# Patient Record
Sex: Female | Born: 1970
Health system: Southern US, Community
[De-identification: ages and names within clinical notes are randomized; demographics above are authoritative.]

## PROBLEM LIST (undated history)

## (undated) DIAGNOSIS — M1990 Unspecified osteoarthritis, unspecified site: Secondary | ICD-10-CM

## (undated) DIAGNOSIS — F419 Anxiety disorder, unspecified: Secondary | ICD-10-CM

## (undated) DIAGNOSIS — E669 Obesity, unspecified: Secondary | ICD-10-CM

## (undated) DIAGNOSIS — K219 Gastro-esophageal reflux disease without esophagitis: Secondary | ICD-10-CM

## (undated) DIAGNOSIS — T4145XA Adverse effect of unspecified anesthetic, initial encounter: Secondary | ICD-10-CM

## (undated) DIAGNOSIS — Z9889 Other specified postprocedural states: Secondary | ICD-10-CM

## (undated) DIAGNOSIS — B019 Varicella without complication: Secondary | ICD-10-CM

## (undated) DIAGNOSIS — G43909 Migraine, unspecified, not intractable, without status migrainosus: Secondary | ICD-10-CM

## (undated) DIAGNOSIS — Z8719 Personal history of other diseases of the digestive system: Secondary | ICD-10-CM

## (undated) DIAGNOSIS — R112 Nausea with vomiting, unspecified: Secondary | ICD-10-CM

## (undated) DIAGNOSIS — F32A Depression, unspecified: Secondary | ICD-10-CM

## (undated) DIAGNOSIS — T8859XA Other complications of anesthesia, initial encounter: Secondary | ICD-10-CM

## (undated) DIAGNOSIS — T7840XA Allergy, unspecified, initial encounter: Secondary | ICD-10-CM

## (undated) DIAGNOSIS — J45909 Unspecified asthma, uncomplicated: Secondary | ICD-10-CM

## (undated) DIAGNOSIS — Z862 Personal history of diseases of the blood and blood-forming organs and certain disorders involving the immune mechanism: Secondary | ICD-10-CM

## (undated) DIAGNOSIS — I8390 Asymptomatic varicose veins of unspecified lower extremity: Secondary | ICD-10-CM

## (undated) DIAGNOSIS — A630 Anogenital (venereal) warts: Secondary | ICD-10-CM

## (undated) DIAGNOSIS — J189 Pneumonia, unspecified organism: Secondary | ICD-10-CM

## (undated) DIAGNOSIS — B009 Herpesviral infection, unspecified: Secondary | ICD-10-CM

## (undated) HISTORY — DX: Varicella without complication: B01.9

## (undated) HISTORY — PX: UPPER GASTROINTESTINAL ENDOSCOPY: SHX188

## (undated) HISTORY — DX: Gastro-esophageal reflux disease without esophagitis: K21.9

## (undated) HISTORY — DX: Personal history of other diseases of the digestive system: Z87.19

## (undated) HISTORY — PX: BREAST SURGERY: SHX581

## (undated) HISTORY — PX: LASER ABLATION: SHX1947

## (undated) HISTORY — DX: Unspecified osteoarthritis, unspecified site: M19.90

## (undated) HISTORY — DX: Migraine, unspecified, not intractable, without status migrainosus: G43.909

## (undated) HISTORY — DX: Depression, unspecified: F32.A

## (undated) HISTORY — PX: UPPER GI ENDOSCOPY: SHX6162

## (undated) HISTORY — DX: Unspecified asthma, uncomplicated: J45.909

## (undated) HISTORY — DX: Allergy, unspecified, initial encounter: T78.40XA

---
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
## 1999-12-09 HISTORY — PX: TONSILLECTOMY AND ADENOIDECTOMY: SHX28

## 1999-12-19 HISTORY — PX: TUBAL LIGATION: SHX77

## 2009-03-08 HISTORY — PX: ANKLE FRACTURE SURGERY: SHX122

## 2014-12-08 DIAGNOSIS — Z9884 Bariatric surgery status: Secondary | ICD-10-CM

## 2014-12-08 HISTORY — DX: Bariatric surgery status: Z98.84

## 2015-06-18 HISTORY — PX: GASTRIC BYPASS: SHX52

## 2018-12-08 HISTORY — PX: OTHER SURGICAL HISTORY: SHX169

## 2019-02-10 ENCOUNTER — Ambulatory Visit (INDEPENDENT_AMBULATORY_CARE_PROVIDER_SITE_OTHER): Payer: 59 | Admitting: Allergy & Immunology

## 2019-02-10 ENCOUNTER — Encounter: Payer: Self-pay | Admitting: Allergy & Immunology

## 2019-02-10 VITALS — BP 104/70 | HR 76 | Temp 98.1°F | Resp 16 | Ht 67.0 in

## 2019-02-10 DIAGNOSIS — J4540 Moderate persistent asthma, uncomplicated: Secondary | ICD-10-CM | POA: Diagnosis not present

## 2019-02-10 DIAGNOSIS — T7800XA Anaphylactic reaction due to unspecified food, initial encounter: Secondary | ICD-10-CM | POA: Insufficient documentation

## 2019-02-10 DIAGNOSIS — L2089 Other atopic dermatitis: Secondary | ICD-10-CM | POA: Insufficient documentation

## 2019-02-10 DIAGNOSIS — T7800XD Anaphylactic reaction due to unspecified food, subsequent encounter: Secondary | ICD-10-CM | POA: Diagnosis not present

## 2019-02-10 DIAGNOSIS — J302 Other seasonal allergic rhinitis: Secondary | ICD-10-CM | POA: Insufficient documentation

## 2019-02-10 DIAGNOSIS — J3089 Other allergic rhinitis: Secondary | ICD-10-CM | POA: Diagnosis not present

## 2019-02-10 MED ORDER — SINUS RINSE REFILL NA PACK: PACK | NASAL | 5 refills | Status: AC

## 2019-02-10 MED ORDER — OMALIZUMAB 150 MG ~~LOC~~ SOLR
300.0000 mg | SUBCUTANEOUS | Status: DC
  Administered 2019-02-10 – 2021-01-03 (×49): 300 mg via SUBCUTANEOUS

## 2019-02-10 NOTE — Patient Instructions (Addendum)
1. Moderate persistent asthma, uncomplicated - Lung testing looked excellent (GREAT WORK!). - Daily controller medication(s): Xolair 300mg  every 14 days, Arnuity 217mcg one puff once daily and Advair 230/75mcg two puffs twice daily with spacer - Prior to physical activity: albuterol 2 puffs 10-15 minutes before physical activity. - Rescue medications: albuterol 4 puffs every 4-6 hours as needed - Changes during respiratory infections or worsening symptoms: Increase Arnuity 222mcg to 1 puff twice daily for OEN TO TWO WEEKS. - Asthma control goals:  * Full participation in all desired activities (may need albuterol before activity) * Albuterol use two time or less a week on average (not counting use with activity) * Cough interfering with sleep two time or less a month * Oral steroids no more than once a year * No hospitalizations  2. Seasonal and perennial allergic rhinitis - Continue with cetirizine 20mg  twice daily. - Continue with Dymista two sprays per nostril up to twice daily. - Continue with nasal saline rinses.   3. Anaphylactic shock due to food - Continue to avoid shrimp.  - [name] is up to date.  4. Return in about 6 months (around 08/13/2019).   Please inform us of any Emergency Department visits, hospitalizations, or changes in symptoms. Call us before going to the ED for breathing or allergy symptoms since we might be able to fit you in for a sick visit. Feel free to contact us anytime with any questions, problems, or concerns.  It was a pleasure to see you again today!  Websites that have reliable patient information: 1. American Academy of Asthma, Allergy, and Immunology: www.aaaai.org 2. Food Allergy Research and Education (FARE): foodallergy.org 3. Mothers of Asthmatics: http://www.asthmacommunitynetwork.org 4. American College of Allergy, Asthma, and Immunology: MonthlyElectricBill.co.uk   Make sure you are registered to vote! If you have moved or changed any of your contact  information, you will need to get this updated before voting!    Voter ID laws are NOT going into effect for the General Election in November 2020! DO NOT let this stop you from exercising your right to vote!

## 2019-02-10 NOTE — Progress Notes (Signed)
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 Wemhoff presents to establish care.  She has remained very stable on her current regimen which has a rather high ICS load in combination with Xolair.  However, since she has been so stable on this regimen, I do not think we need to make any changes at this point.  Depending on what her new insurance covers, we might have to make a few changes here and there.  Symptoms are consider in the future as a change to Dupixent.  This might address her baseline itching as well, which unfortunately is controlled only with up to 40 mg of cetirizine daily.  We did get her started today on her Xolair since she is almost a month behind.  She received a sample of 30 mg.  Plan/Recommendations:   1. Moderate persistent asthma, uncomplicated - Lung testing looked excellent. - Daily controller medication(s): Xolair 300mg  every 14 days, Arnuity 253mcg one puff once daily and Advair 230/69mcg two puffs twice daily with spacer - Prior to physical activity: albuterol 2 puffs 10-15 minutes before physical activity. - Rescue medications: albuterol 4 puffs every 4-6 hours as needed - Changes during respiratory infections or worsening symptoms: Increase Arnuity 264mcg to 1 puff twice daily for ONE TO TWO WEEKS. - Asthma control goals:  * Full participation in all desired activities (may need albuterol before activity) * Albuterol use two time or less a week on average (not counting use with activity) * Cough interfering with sleep two time or less a  month * Oral steroids no more than once a year * No hospitalizations  2. Seasonal and perennial allergic rhinitis - Continue with cetirizine 20mg  twice daily. - Continue with Dymista two sprays per nostril up to twice daily. - Continue with nasal saline rinses.   3. Anaphylactic shock due to food - Continue to avoid shrimp.  - [name] is up to date.  4. Return in about 6 months (around 08/13/2019).   Subjective:   Merrin Mcvicker is a 48 y.o. female presenting today for evaluation of  Chief Complaint  Patient presents with  . Asthma  . Allergic Rhinitis     Taneesha Edgin has a history of the following: There are no active problems to display for this patient.   History obtained from: chart review and patient.  Clatie Kessen was referred by Patient, No Pcp Per.     Kaytlynn is a 48 y.o. female presenting for establishment of care.   Asthma/Respiratory Symptom History: She has a longstanding history of asthma.  She was diagnosed when she a child in [place]. She was an infant when she had her first episode. She was in the hospital a lot when she was younger in [place]. She was never intubated at all. She was previously followed by Dr. Hortense Ramal at the [facility] in [place].  Currently, she is on Advair 230/21 2 puffs twice daily in combination with Arnuity 1 puff once daily.  She is also on Xolair 300 mg every 2 weeks. She was started on Xolair started on that 6-7 years ago.  She estimates that she rarely gets prednisone at this point. Her last visit was in December 2019.  At that time, her spirometry was near normal, which was new for her.  At the time, she was coughing throughout the night and using her albuterol 3-5 times per day. She had prednisone in January 2020 prior to moving to [place]. She is unsure when she last needed prednisone before that. She also had amoxicillin at that time as well. Her IgE level was 148 in April 2012.   Allergic Rhinitis Symptom  History: She has a history of allergic rhinitis.  Her last testing was in April 2012 and was positive to cockroach, cat, dust mite, molds, trees, and grass. She completed five years of allergen immunotherapy in December 2019.  She did get a CT of her sinuses in December 2019 that were completely clear. She is currently on cetirizine 20mg  BID. She is also on Dymista and she sinus rinses daily. She has never tried any other antihistamines. She will occasionally   Food Allergy Symptom History: She has a history of anaphylaxis to shrimp, but interestingly she tolerates crab and lobster and other shellfish without a problem. Her last IgE to shrimp was 18.4 in April 2012. She has an [place] that is up to date.  Eczema Symptom Symptom History: Currently hse is using hydrocortisone 2.5%, triamcinolone for hte worst areas, and a hydrocortisone Eucerin mix. She never needs antibiotics for Staphylococcal skin infections. She does get a rash around her neck with hard inflammation.   She has a history of gastric bypass. She does not have a local bariatric doctor doctor here and she does not have a PCP either. She prefers a female PCP.   Otherwise, there is no history of other atopic diseases, including drug allergies, stinging insect allergies, urticaria or contact dermatitis. There is no significant infectious history. Vaccinations are up to date.    Past Medical History: There are no active problems to display for this patient.   Medication List:  Allergies as of 02/10/2019      Reactions   Cyclobenzaprine Other (See Comments), Shortness Of Breath   Relaxed tounge Relaxed tounge Relaxed tounge Relaxed tounge   Shellfish Allergy Shortness Of Breath   Shortness of breath   Sulfa Antibiotics Anaphylaxis, Hives, Swelling   Carisoprodol Hives   Morphine Other (See Comments)   Feels like her head is going to "blow up" Feels like her head is going to "blow up"   Adhesive  [tape] Rash   Other reaction(s):  SKIN - rash Other reaction(s): SKIN - rash      Medication List       Accurate as of February 10, 2019  8:10 PM. Always use your most recent med list.        acetaminophen 500 MG tablet Commonly known as:  TYLENOL Take by mouth.   Azelastine-Fluticasone 137-50 MCG/ACT Susp Place into the nose.   CALCIUM CITRATE PO Take 2 tablets by mouth daily.   cetirizine 10 MG tablet Commonly known as:  ZYRTEC Take by mouth.   Cyanocobalamin 500 MCG/0.1ML Soln Place into the nose.   diazepam 10 MG tablet Commonly known as:  VALIUM Take by mouth.   EpiPen 2-Pak 0.3 mg/0.3 mL Soaj injection Generic drug:  EPINEPHrine Inject into the muscle.   ferrous fumarate-iron polysaccharide complex 162-115.2 MG Caps capsule Commonly known as:  TANDEM Take by mouth.   Fluticasone Furoate 200 MCG/ACT Aepb Inhale into the lungs.   fluticasone-salmeterol 230-21 MCG/ACT  inhaler Commonly known as:  ADVAIR HFA Inhale into the lungs.   hydrocortisone 1 % ointment   hydrocortisone 2.5 % ointment   ibuprofen 800 MG tablet Commonly known as:  ADVIL,MOTRIN Take by mouth.   ipratropium 0.03 % nasal spray Commonly known as:  ATROVENT Place into the nose.   lidocaine 5 % ointment Commonly known as:  XYLOCAINE Apply topically.   meclizine 25 MG tablet Commonly known as:  ANTIVERT   Mucus Relief ER 600 MG 12 hr tablet Generic drug:  guaiFENesin TK 2 TS PO BID   MULTI-VITAMIN DAILY PO Take by mouth.   Olopatadine HCl 0.2 % Soln Apply to eye.   omalizumab 150 MG injection Commonly known as:  XOLAIR Inject into the skin.   ondansetron 4 MG disintegrating tablet Commonly known as:  ZOFRAN-ODT Place under the tongue.   pantoprazole 40 MG tablet Commonly known as:  PROTONIX   PEG 3350 Powd Take by mouth.   Sinus Rinse Refill Pack Use one packet dissolved in water as needed.   SUMAtriptan 100 MG tablet Commonly known as:  IMITREX   triamcinolone ointment 0.1 % Commonly known  as:  KENALOG Apply topically.   UNABLE TO FIND Massage as directed.   Neck, back, shoulder pain.   Flex-spending account   UNABLE TO FIND Below the knee compression stockings, sig: use daily as needed for lower extremity edema   valACYclovir 500 MG tablet Commonly known as:  VALTREX   Ventolin HFA 108 (90 Base) MCG/ACT inhaler Generic drug:  albuterol Inhale into the lungs.   Virtussin A/C 100-10 MG/5ML syrup Generic drug:  guaiFENesin-codeine TAKE 5 ML BY MOUTH THREE TIMES DAILY AS NEEDED FOR COUGH   Vitamin D (Ergocalciferol) 1.25 MG (50000 UT) Caps capsule Commonly known as:  DRISDOL Take by mouth.   VITAMIN D3 PO Take by mouth.       Birth History: non-contributory  Developmental History: non-contributory  Past Surgical History: Past Surgical History:  Procedure Laterality Date  . ANKLE FRACTURE SURGERY  03/08/2009  . BREAST SURGERY Bilateral    Cyst Removal  . GASTRIC BYPASS  06/18/2015  . TUBAL LIGATION  12/19/1999  . Uterine ablation  12/2018     Family History: Family History  Problem Relation Age of Onset  . COPD Mother   . Heart disease Mother   . COPD Father   . Aneurysm Father   . Heart disease Father   . Early death Father   . Asthma Neg Hx   . Allergic rhinitis Neg Hx      Social History: Caris lives at home by herself.  She lives in a house of unknown age, currently renting. She moved here very recently from [place]. She is going through a divorce right now and wanted a new start.  There is wood in the main living areas and carpeting and wood in the bedrooms.  They have electric heating and central cooling.  There are no animals outside or inside of the home.  She does have dust mite covers on her bedding.  There is no tobacco exposure.  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   Review of Systems  Constitutional: Negative.  Negative for fever, malaise/fatigue and weight  loss.  HENT: Negative.  Negative for congestion, ear discharge and ear pain.   Eyes: Negative for pain, discharge and redness.  Respiratory: Negative for cough, sputum production, shortness of breath and  wheezing.   Cardiovascular: Negative.  Negative for chest pain and palpitations.  Gastrointestinal: Negative for abdominal pain and heartburn.  Skin: Positive for itching. Negative for rash.  Neurological: Negative for dizziness and headaches.  Endo/Heme/Allergies: Negative for environmental allergies. Does not bruise/bleed easily.       Objective:   Blood pressure 104/70, pulse 76, temperature 98.1 F (36.7 C), temperature source Oral, resp. rate 16, height 5\' 7"  (1.702 m), SpO2 98 %. There is no height or weight on file to calculate BMI.   Physical Exam:   Physical Exam  Constitutional: Vital signs are normal. She appears well-nourished.  Very pleasant and smiling female.  HENT:  Head: Normocephalic and atraumatic.  Right Ear: Tympanic membrane, external ear and ear canal normal. No drainage, swelling or tenderness. Tympanic membrane is not injected, not scarred, not erythematous, not retracted and not bulging.  Left Ear: Tympanic membrane, external ear and ear canal normal. No drainage, swelling or tenderness. Tympanic membrane is not injected, not scarred, not erythematous, not retracted and not bulging.  Nose: Rhinorrhea present. No mucosal edema, nasal deformity or septal deviation. No epistaxis. Right sinus exhibits no maxillary sinus tenderness and no frontal sinus tenderness. Left sinus exhibits no maxillary sinus tenderness and no frontal sinus tenderness.  Mouth/Throat: Uvula is midline and oropharynx is clear and moist. Mucous membranes are not pale and not dry.  No polyps appreciated. Tonsils surgically removed.  Eyes: Pupils are equal, round, and reactive to light. Conjunctivae and EOM are normal. Right eye exhibits no chemosis and no discharge. Left eye exhibits no  chemosis and no discharge. Right conjunctiva is not injected. Left conjunctiva is not injected.  Cardiovascular: Normal rate, regular rhythm, S1 normal, S2 normal and normal heart sounds.  Respiratory: Effort normal and breath sounds normal. No accessory muscle usage. No tachypnea. No respiratory distress. She has no wheezes. She has no rhonchi. She has no rales. She exhibits no tenderness.  Moving air well in all lung fields.  GI: There is no abdominal tenderness. There is no rebound and no guarding.  Lymphadenopathy:       Head (right side): No submandibular, no tonsillar and no occipital adenopathy present.       Head (left side): No submandibular, no tonsillar and no occipital adenopathy present.    She has no cervical adenopathy.  Neurological: She is alert.  Skin: No abrasion, no petechiae and no rash noted. Rash is not papular, not vesicular and not urticarial. No erythema. No pallor.  No eczematous lesions noted.   Psychiatric: She has a normal mood and affect.     Diagnostic studies:    Spirometry: results normal (FEV1: 2.28/85%, FVC: 2.58/78%, FEV1/FVC: 88%).    Spirometry consistent with normal pattern.   Allergy Studies: none      Salvatore Marvel, MD Allergy and [place] [facility] [place]

## 2019-02-21 ENCOUNTER — Ambulatory Visit: Payer: Self-pay | Admitting: Allergy & Immunology

## 2019-02-25 ENCOUNTER — Telehealth: Payer: Self-pay | Admitting: Pharmacist

## 2019-02-25 ENCOUNTER — Other Ambulatory Visit: Payer: Self-pay | Admitting: *Deleted

## 2019-02-25 MED ORDER — OMALIZUMAB 150 MG ~~LOC~~ SOLR: 300.0000 mg | SUBCUTANEOUS | 11 refills | Status: DC

## 2019-02-25 NOTE — Telephone Encounter (Signed)
Advised Beth in [place] to advise patient that Xolair approved and sending Rx to [facility] today. She will need to supply copay card info she already has.

## 2019-02-25 NOTE — Telephone Encounter (Signed)
Called patient to schedule an appointment for the [facility]. I was unable to reach the patient so I left a HIPAA-compliant message requesting that the patient return my call.   

## 2019-02-28 ENCOUNTER — Other Ambulatory Visit: Payer: Self-pay

## 2019-02-28 ENCOUNTER — Encounter: Payer: Self-pay | Admitting: Pharmacist

## 2019-02-28 ENCOUNTER — Ambulatory Visit: Payer: Self-pay | Admitting: Pharmacist

## 2019-02-28 ENCOUNTER — Other Ambulatory Visit: Payer: Self-pay | Admitting: Pharmacist

## 2019-02-28 MED ORDER — OMALIZUMAB 150 MG ~~LOC~~ SOLR: 300.0000 mg | SUBCUTANEOUS | 11 refills | Status: DC

## 2019-02-28 NOTE — Progress Notes (Signed)
   S: Patient presents to [facility] for review of their specialty medication therapy.  Patient is currently taking Xolair for asthma. Patient is managed by Dr. Ernst Bowler for this.   Adherence: denies any missed doses  Efficacy: works very well for her, she has been on it for years. She also works in [employer] and is well aware of Xolair drug information.  Dosing: Give subcutaneously.  Can be dosed every 2 or 4 weeks based on baseline serum IgE levels and body weight.  Asthma: SubQ: Dose and frequency based on body weight and pretreatment total IgE serum levels. Dosing should be adjusted during therapy for significant changes in body weight. Dosing should not be adjusted based on total IgE levels taken during treatment or <1 year following interruption of therapy. If therapy has been interrupted for ?1 year, total IgE levels may be re-evaluated for dosage determination.   Dose adjustments: Renal: no dose adjustments  Hepatic: no dose adjustments  Toxicity: Severe hypersensitivity reaction or anaphylaxis: Discontinue treatment. Fever, arthralgia, and rash: Discontinue treatment if this constellation of symptoms occurs.   Monitoring: CV effects: denies Eosinophilia and vasculitis: denies Fever/arthralgia/rash: denies Hypersensitivity/Anaphylaxis: denies, patient has EpiPen Malignant neoplasms: denies Pulmonary function tests: see CareEverywhere- last down 11/23/18   O:     No results found for: WBC, HGB, HCT, MCV, PLT    Chemistry   No results found for: NA, K, CL, CO2, BUN, CREATININE, GLU No results found for: CALCIUM, ALKPHOS, AST, ALT, BILITOT     A/P: 1. Medication review: patient currently on Xolair for asthma. Reviewed the medication with the patient, including the following: Xolair, omalizumab, is a novel IgE blocker.  It appears to reduce rates of hospitalizations, ER visits and unscheduled physician visits due asthma exacerbations when added to standard  therapy.  Studies also show a reduction in steroid requirements and improvement in quality of life.  Patient educated on purpose, proper use and potential adverse effects of Xolair.  Following instruction patient verbalized understanding. Patient should always have an EpiPen readily available in the event of anaphylaxis. No recommendations for any changes at this time.   Christella Hartigan, PharmD, BCPS, BCACP, CPP Clinical Pharmacist Practitioner  [phone]

## 2019-03-01 MED FILL — XOLAIR 150 MG SOLR: 150 | 28 days supply | Qty: 4 | Fill #0

## 2019-03-03 ENCOUNTER — Ambulatory Visit (INDEPENDENT_AMBULATORY_CARE_PROVIDER_SITE_OTHER): Payer: 59 | Admitting: *Deleted

## 2019-03-03 DIAGNOSIS — J4540 Moderate persistent asthma, uncomplicated: Secondary | ICD-10-CM | POA: Diagnosis not present

## 2019-03-17 ENCOUNTER — Ambulatory Visit (INDEPENDENT_AMBULATORY_CARE_PROVIDER_SITE_OTHER): Payer: 59 | Admitting: *Deleted

## 2019-03-17 DIAGNOSIS — J4540 Moderate persistent asthma, uncomplicated: Secondary | ICD-10-CM

## 2019-03-23 MED FILL — XOLAIR 150 MG SOLR: 150 | 28 days supply | Qty: 4 | Fill #1

## 2019-03-31 ENCOUNTER — Other Ambulatory Visit: Payer: Self-pay

## 2019-03-31 ENCOUNTER — Ambulatory Visit (INDEPENDENT_AMBULATORY_CARE_PROVIDER_SITE_OTHER): Payer: 59 | Admitting: *Deleted

## 2019-03-31 DIAGNOSIS — J4540 Moderate persistent asthma, uncomplicated: Secondary | ICD-10-CM | POA: Diagnosis not present

## 2019-04-14 ENCOUNTER — Ambulatory Visit (INDEPENDENT_AMBULATORY_CARE_PROVIDER_SITE_OTHER): Payer: 59

## 2019-04-14 ENCOUNTER — Other Ambulatory Visit: Payer: Self-pay

## 2019-04-14 DIAGNOSIS — J4540 Moderate persistent asthma, uncomplicated: Secondary | ICD-10-CM

## 2019-04-19 MED FILL — XOLAIR 150 MG SOLR: 150 | 28 days supply | Qty: 4 | Fill #2

## 2019-04-28 ENCOUNTER — Other Ambulatory Visit: Payer: Self-pay

## 2019-04-28 ENCOUNTER — Ambulatory Visit (INDEPENDENT_AMBULATORY_CARE_PROVIDER_SITE_OTHER): Payer: 59 | Admitting: *Deleted

## 2019-04-28 DIAGNOSIS — J4540 Moderate persistent asthma, uncomplicated: Secondary | ICD-10-CM | POA: Diagnosis not present

## 2019-05-12 ENCOUNTER — Other Ambulatory Visit: Payer: Self-pay

## 2019-05-12 ENCOUNTER — Ambulatory Visit (INDEPENDENT_AMBULATORY_CARE_PROVIDER_SITE_OTHER): Payer: 59

## 2019-05-12 DIAGNOSIS — J4540 Moderate persistent asthma, uncomplicated: Secondary | ICD-10-CM | POA: Diagnosis not present

## 2019-05-24 MED FILL — XOLAIR 150 MG SOLR: 150 | 28 days supply | Qty: 4 | Fill #3

## 2019-05-26 ENCOUNTER — Other Ambulatory Visit: Payer: Self-pay

## 2019-05-26 ENCOUNTER — Ambulatory Visit (INDEPENDENT_AMBULATORY_CARE_PROVIDER_SITE_OTHER): Payer: 59

## 2019-05-26 DIAGNOSIS — J4540 Moderate persistent asthma, uncomplicated: Secondary | ICD-10-CM | POA: Diagnosis not present

## 2019-06-09 ENCOUNTER — Ambulatory Visit (INDEPENDENT_AMBULATORY_CARE_PROVIDER_SITE_OTHER): Payer: 59 | Admitting: *Deleted

## 2019-06-09 ENCOUNTER — Other Ambulatory Visit: Payer: Self-pay

## 2019-06-09 DIAGNOSIS — J4540 Moderate persistent asthma, uncomplicated: Secondary | ICD-10-CM

## 2019-06-16 ENCOUNTER — Telehealth: Payer: Self-pay | Admitting: Allergy & Immunology

## 2019-06-16 DIAGNOSIS — L987 Excessive and redundant skin and subcutaneous tissue: Secondary | ICD-10-CM | POA: Diagnosis not present

## 2019-06-16 DIAGNOSIS — Z9884 Bariatric surgery status: Secondary | ICD-10-CM | POA: Diagnosis not present

## 2019-06-16 DIAGNOSIS — Z9189 Other specified personal risk factors, not elsewhere classified: Secondary | ICD-10-CM | POA: Diagnosis not present

## 2019-06-16 DIAGNOSIS — E669 Obesity, unspecified: Secondary | ICD-10-CM

## 2019-06-16 DIAGNOSIS — K219 Gastro-esophageal reflux disease without esophagitis: Secondary | ICD-10-CM | POA: Diagnosis not present

## 2019-06-16 MED FILL — ONDANSETRON ODT 4 MG TABLET: 4 | 3 days supply | Qty: 20 | Fill #0

## 2019-06-16 MED FILL — VIT D2 1.25 MG (50,000 UNIT: 1.25 MG | 28 days supply | Qty: 4 | Fill #0

## 2019-06-16 NOTE — Telephone Encounter (Signed)
I received a call from the patient requesting that I order labs.  She continues to follow with a bariatric surgeon in [place], where she recently moved from.  He recommended several preop labs.  Orders placed.  She will get these drawn at her workplace.  Salvatore Marvel, MD Allergy and [place] [facility] [place]

## 2019-06-21 MED FILL — XOLAIR 150 MG SOLR: 150 | 28 days supply | Qty: 4 | Fill #4

## 2019-06-23 ENCOUNTER — Other Ambulatory Visit: Payer: Self-pay

## 2019-06-23 ENCOUNTER — Ambulatory Visit (INDEPENDENT_AMBULATORY_CARE_PROVIDER_SITE_OTHER): Payer: 59 | Admitting: *Deleted

## 2019-06-23 DIAGNOSIS — J4540 Moderate persistent asthma, uncomplicated: Secondary | ICD-10-CM

## 2019-06-29 DIAGNOSIS — E669 Obesity, unspecified: Secondary | ICD-10-CM | POA: Diagnosis not present

## 2019-06-30 LAB — CMP14+EGFR
ALT: 29 IU/L (ref 0–32)
AST: 25 IU/L (ref 0–40)
Albumin/Globulin Ratio: 1.6 (ref 1.2–2.2)
Albumin: 4.3 g/dL (ref 3.8–4.8)
Alkaline Phosphatase: 52 IU/L (ref 39–117)
BUN/Creatinine Ratio: 14 (ref 9–23)
BUN: 11 mg/dL (ref 6–24)
Bilirubin Total: 0.5 mg/dL (ref 0.0–1.2)
CO2: 22 mmol/L (ref 20–29)
Calcium: 8.7 mg/dL (ref 8.7–10.2)
Chloride: 103 mmol/L (ref 96–106)
Creatinine, Ser: 0.79 mg/dL (ref 0.57–1.00)
GFR calc Af Amer: 104 mL/min/{1.73_m2} (ref >59)
GFR calc non Af Amer: 90 mL/min/{1.73_m2} (ref >59)
Globulin, Total: 2.7 g/dL (ref 1.5–4.5)
Glucose: 55 mg/dL — ABNORMAL LOW (ref 65–99)
Potassium: 3.6 mmol/L (ref 3.5–5.2)
Sodium: 140 mmol/L (ref 134–144)
Total Protein: 7 g/dL (ref 6.0–8.5)

## 2019-06-30 LAB — CBC WITH DIFFERENTIAL/PLATELET
Basophils Absolute: 0.1 10*3/uL (ref 0.0–0.2)
Basos: 1 %
EOS (ABSOLUTE): 0.1 10*3/uL (ref 0.0–0.4)
Eos: 1 %
Hematocrit: 39.5 % (ref 34.0–46.6)
Hemoglobin: 13.3 g/dL (ref 11.1–15.9)
Immature Grans (Abs): 0 10*3/uL (ref 0.0–0.1)
Immature Granulocytes: 0 %
Lymphocytes Absolute: 1.9 10*3/uL (ref 0.7–3.1)
Lymphs: 28 %
MCH: 31.9 pg (ref 26.6–33.0)
MCHC: 33.7 g/dL (ref 31.5–35.7)
MCV: 95 fL (ref 79–97)
Monocytes Absolute: 0.6 10*3/uL (ref 0.1–0.9)
Monocytes: 9 %
Neutrophils Absolute: 4.1 10*3/uL (ref 1.4–7.0)
Neutrophils: 61 %
Platelets: 130 10*3/uL — ABNORMAL LOW (ref 150–450)
RBC: 4.17 x10E6/uL (ref 3.77–5.28)
RDW: 12 % (ref 11.7–15.4)
WBC: 6.8 10*3/uL (ref 3.4–10.8)

## 2019-06-30 LAB — VITAMIN B12: Vitamin B-12: 266 pg/mL (ref 232–1245)

## 2019-06-30 LAB — PARATHYROID HORMONE, INTACT (NO CA): PTH: 19 pg/mL (ref 15–65)

## 2019-06-30 LAB — IRON,TIBC AND FERRITIN PANEL
Ferritin: 62 ng/mL (ref 15–150)
Iron Saturation: 23 % (ref 15–55)
Iron: 75 ug/dL (ref 27–159)
Total Iron Binding Capacity: 320 ug/dL (ref 250–450)
UIBC: 245 ug/dL (ref 131–425)

## 2019-06-30 LAB — VITAMIN D 25 HYDROXY (VIT D DEFICIENCY, FRACTURES): Vit D, 25-Hydroxy: 25.4 ng/mL — ABNORMAL LOW (ref 30.0–100.0)

## 2019-07-05 ENCOUNTER — Telehealth: Payer: Self-pay | Admitting: Allergy & Immunology

## 2019-07-05 MED ORDER — BREO ELLIPTA 200-25 MCG/INH IN AEPB: 1.0000 | INHALATION_SPRAY | Freq: Every day | RESPIRATORY_TRACT | 3 refills | Status: AC

## 2019-07-05 NOTE — Telephone Encounter (Signed)
Breo script sent in. Advair was too expensive.   Salvatore Marvel, MD Allergy and [place] [facility] [employer]

## 2019-07-06 ENCOUNTER — Other Ambulatory Visit: Payer: Self-pay

## 2019-07-06 ENCOUNTER — Ambulatory Visit (INDEPENDENT_AMBULATORY_CARE_PROVIDER_SITE_OTHER): Payer: 59

## 2019-07-06 DIAGNOSIS — J4540 Moderate persistent asthma, uncomplicated: Secondary | ICD-10-CM | POA: Diagnosis not present

## 2019-07-06 MED FILL — BREO ELLIPTA 200-25 MCG INH: 200-25 | 90 days supply | Qty: 180 | Fill #0

## 2019-07-07 ENCOUNTER — Ambulatory Visit: Payer: Self-pay

## 2019-07-20 ENCOUNTER — Ambulatory Visit: Payer: Self-pay

## 2019-07-20 MED FILL — XOLAIR 150 MG SOLR: 150 | 28 days supply | Qty: 4 | Fill #5

## 2019-07-22 ENCOUNTER — Ambulatory Visit (INDEPENDENT_AMBULATORY_CARE_PROVIDER_SITE_OTHER): Payer: 59 | Admitting: *Deleted

## 2019-07-22 DIAGNOSIS — J4540 Moderate persistent asthma, uncomplicated: Secondary | ICD-10-CM | POA: Diagnosis not present

## 2019-07-22 DIAGNOSIS — J309 Allergic rhinitis, unspecified: Secondary | ICD-10-CM

## 2019-08-04 ENCOUNTER — Telehealth: Payer: Self-pay | Admitting: Allergy & Immunology

## 2019-08-04 ENCOUNTER — Ambulatory Visit (INDEPENDENT_AMBULATORY_CARE_PROVIDER_SITE_OTHER): Payer: 59 | Admitting: *Deleted

## 2019-08-04 DIAGNOSIS — J4540 Moderate persistent asthma, uncomplicated: Secondary | ICD-10-CM | POA: Diagnosis not present

## 2019-08-04 DIAGNOSIS — J309 Allergic rhinitis, unspecified: Secondary | ICD-10-CM

## 2019-08-04 MED ORDER — FLUCONAZOLE 150 MG PO TABS: 150.0000 mg | ORAL_TABLET | Freq: Every day | ORAL | 0 refills | Status: AC

## 2019-08-04 NOTE — Telephone Encounter (Signed)
Patient reported white patches in mouth and a sensation consistent with oral Candidiasis. On exam when I looked in her throat at work, she does have a couple of isolated white patches on the buccal mucosa. Fluconazole sent in.  Salvatore Marvel, MD Allergy and [place] [facility] [place]

## 2019-08-11 ENCOUNTER — Telehealth: Payer: Self-pay | Admitting: Allergy & Immunology

## 2019-08-11 MED ORDER — EPINEPHRINE 0.3 MG/0.3ML IJ SOAJ: 0.3000 mg | INTRAMUSCULAR | 2 refills | Status: DC | PRN

## 2019-08-11 NOTE — Telephone Encounter (Signed)
AuviQ script sent in.   Salvatore Marvel, MD Allergy and [place] [facility] [place]

## 2019-08-16 MED FILL — XOLAIR 150 MG SOLR: 150 | 28 days supply | Qty: 4 | Fill #6

## 2019-08-18 ENCOUNTER — Ambulatory Visit (INDEPENDENT_AMBULATORY_CARE_PROVIDER_SITE_OTHER): Payer: 59 | Admitting: *Deleted

## 2019-08-18 DIAGNOSIS — J4540 Moderate persistent asthma, uncomplicated: Secondary | ICD-10-CM | POA: Diagnosis not present

## 2019-09-01 ENCOUNTER — Ambulatory Visit (INDEPENDENT_AMBULATORY_CARE_PROVIDER_SITE_OTHER): Payer: 59

## 2019-09-01 ENCOUNTER — Other Ambulatory Visit: Payer: Self-pay

## 2019-09-01 DIAGNOSIS — J4540 Moderate persistent asthma, uncomplicated: Secondary | ICD-10-CM | POA: Diagnosis not present

## 2019-09-15 ENCOUNTER — Ambulatory Visit: Payer: Self-pay

## 2019-09-15 MED FILL — XOLAIR 150 MG SOLR: 150 | 28 days supply | Qty: 4 | Fill #7

## 2019-09-16 ENCOUNTER — Other Ambulatory Visit: Payer: Self-pay

## 2019-09-16 ENCOUNTER — Ambulatory Visit (INDEPENDENT_AMBULATORY_CARE_PROVIDER_SITE_OTHER): Payer: 59 | Admitting: *Deleted

## 2019-09-16 DIAGNOSIS — J4540 Moderate persistent asthma, uncomplicated: Secondary | ICD-10-CM

## 2019-09-19 DIAGNOSIS — N760 Acute vaginitis: Secondary | ICD-10-CM | POA: Diagnosis not present

## 2019-09-19 DIAGNOSIS — Z6831 Body mass index (BMI) 31.0-31.9, adult: Secondary | ICD-10-CM | POA: Diagnosis not present

## 2019-09-19 DIAGNOSIS — Z01419 Encounter for gynecological examination (general) (routine) without abnormal findings: Secondary | ICD-10-CM | POA: Diagnosis not present

## 2019-09-19 MED FILL — TRANEXAMIC ACID 650 MG TAB: 650 | 5 days supply | Qty: 30 | Fill #0

## 2019-09-28 MED FILL — ALBUTEROL SULFATE HFA 108 (: 108 (90 BAS | 25 days supply | Qty: 54 | Fill #0

## 2019-09-28 MED FILL — TRANEXAMIC ACID 650 MG TAB: 650 | 5 days supply | Qty: 30 | Fill #0

## 2019-09-29 ENCOUNTER — Ambulatory Visit (INDEPENDENT_AMBULATORY_CARE_PROVIDER_SITE_OTHER): Payer: 59 | Admitting: *Deleted

## 2019-09-29 ENCOUNTER — Other Ambulatory Visit: Payer: Self-pay

## 2019-09-29 DIAGNOSIS — J4540 Moderate persistent asthma, uncomplicated: Secondary | ICD-10-CM

## 2019-09-30 MED FILL — IBUPROFEN 800 MG TABS: 800 | 30 days supply | Qty: 90 | Fill #0

## 2019-10-11 MED FILL — XOLAIR 150 MG SOLR: 150 | 28 days supply | Qty: 4 | Fill #8

## 2019-10-13 ENCOUNTER — Other Ambulatory Visit: Payer: Self-pay

## 2019-10-13 ENCOUNTER — Ambulatory Visit (INDEPENDENT_AMBULATORY_CARE_PROVIDER_SITE_OTHER): Payer: 59

## 2019-10-13 ENCOUNTER — Ambulatory Visit: Payer: 59

## 2019-10-13 DIAGNOSIS — J4540 Moderate persistent asthma, uncomplicated: Secondary | ICD-10-CM | POA: Diagnosis not present

## 2019-10-26 ENCOUNTER — Ambulatory Visit (HOSPITAL_COMMUNITY)
Admission: EM | Admit: 2019-10-26 | Discharge: 2019-10-26 | Disposition: A | Discharge status: Home / Self Care | Payer: 59 | Attending: Family Medicine | Admitting: Family Medicine

## 2019-10-26 ENCOUNTER — Other Ambulatory Visit: Payer: Self-pay

## 2019-10-26 ENCOUNTER — Encounter (HOSPITAL_COMMUNITY): Payer: Self-pay | Admitting: Emergency Medicine

## 2019-10-26 DIAGNOSIS — N3090 Cystitis, unspecified without hematuria: Secondary | ICD-10-CM | POA: Diagnosis not present

## 2019-10-26 LAB — POCT URINALYSIS DIP (DEVICE)
Bilirubin Urine: NEGATIVE
Glucose, UA: NEGATIVE mg/dL
Ketones, ur: NEGATIVE mg/dL
Nitrite: POSITIVE — AB
Protein, ur: 30 mg/dL — AB
Specific Gravity, Urine: 1.025 (ref 1.005–1.030)
Urobilinogen, UA: 0.2 mg/dL (ref 0.0–1.0)
pH: 5.5 (ref 5.0–8.0)

## 2019-10-26 MED ORDER — FLUCONAZOLE 150 MG PO TABS: ORAL_TABLET | ORAL | 0 refills | Status: DC

## 2019-10-26 MED ORDER — CEPHALEXIN 500 MG PO CAPS: 500.0000 mg | ORAL_CAPSULE | Freq: Two times a day (BID) | ORAL | 0 refills | Status: DC

## 2019-10-26 NOTE — ED Triage Notes (Signed)
Pt states over the weekend she had urinary urgency, states she was having difficulty holding it. States when she goes to the restroom, she goes, and then she feels like she has to go again.

## 2019-10-26 NOTE — ED Provider Notes (Signed)
[facility]    ASSESSMENT & PLAN:  1. Cystitis     Begin: Meds ordered this encounter  Medications  . cephALEXin (KEFLEX) 500 MG capsule    Sig: Take 1 capsule (500 mg total) by mouth 2 (two) times daily.    Dispense:  10 capsule    Refill:  0  . fluconazole (DIFLUCAN) 150 MG tablet    Sig: Take one tablet by mouth until yeast infection improves.    Dispense:  7 tablet    Refill:  0    No signs of pyelonephritis. Discussed. Urine culture sent. Will notify patient when results available. Will follow up with her PCP or here if not showing improvement over the next 48 hours, sooner if needed.  Outlined signs and symptoms indicating need for more acute intervention. Patient verbalized understanding. After Visit Summary given.  SUBJECTIVE:  Donna Park is a 48 y.o. female who complains of urinary frequency, urgency and dysuria for the past few days; occasional leakage of urine before she can make it to the toilet. Without associated flank pain, fever, chills, vaginal discharge or bleeding. Gross hematuria: not present. No specific aggravating or alleviating factors reported. No LE edema. Normal PO intake without n/v/d. Without specific abdominal pain. Ambulatory without difficulty. OTC treatment: none. H/O UTI: greater than 20 years ago. No concern for STD.  LMP: No LMP recorded. Patient has had an ablation.  ROS: As in HPI. All other systems negative.   OBJECTIVE:  Vitals:   10/26/19 1811  BP: 138/86  Pulse: 78  Resp: 18  Temp: 98.3 F (36.8 C)  SpO2: 100%   General appearance: alert; no distress HENT: oropharynx: moist Lungs: unlabored respirations Abdomen: soft, non-tender; no guarding Back: no CVA tenderness Extremities: no edema; symmetrical with no gross deformities Skin: warm and dry Neurologic: normal gait Psychological: alert and cooperative; normal mood and affect  Labs Reviewed  POCT URINALYSIS DIP (DEVICE) - Abnormal; Notable for the  following components:      Result Value   Hgb urine dipstick TRACE (*)    Protein, ur 30 (*)    Nitrite POSITIVE (*)    Leukocytes,Ua SMALL (*)    All other components within normal limits  URINE CULTURE    Allergies  Allergen Reactions  . Cyclobenzaprine Other (See Comments) and Shortness Of Breath    Relaxed tounge Relaxed tounge Relaxed tounge Relaxed tounge   . Shellfish Allergy Shortness Of Breath    Shortness of breath   . Sulfa Antibiotics Anaphylaxis, Hives and Swelling  . Carisoprodol Hives  . Morphine Other (See Comments)    Feels like her head is going to "blow up" Feels like her head is going to "blow up"   . Adhesive  [Tape] Rash    Other reaction(s): SKIN - rash Other reaction(s): SKIN - rash     Past Medical History:  Diagnosis Date  . Asthma    Social History   Socioeconomic History  . Marital status: Married    Spouse name: Not on file  . Number of children: Not on file  . Years of education: Not on file  . Highest education level: Not on file  Occupational History  . Not on file  Social Needs  . Financial resource strain: Not on file  . Food insecurity    Worry: Not on file    Inability: Not on file  . Transportation needs    Medical: Not on file    Non-medical: Not on file  Tobacco Use  . Smoking status: Never Smoker  . Smokeless tobacco: Never Used  Substance and Sexual Activity  . Alcohol use: Not on file  . Drug use: Not on file  . Sexual activity: Not on file  Lifestyle  . Physical activity    Days per week: Not on file    Minutes per session: Not on file  . Stress: Not on file  Relationships  . Social Herbalist on phone: Not on file    Gets together: Not on file    Attends religious service: Not on file    Active member of club or organization: Not on file    Attends meetings of clubs or organizations: Not on file    Relationship status: Not on file  . Intimate partner violence    Fear of current or ex  partner: Not on file    Emotionally abused: Not on file    Physically abused: Not on file    Forced sexual activity: Not on file  Other Topics Concern  . Not on file  Social History Narrative  . Not on file   Family History  Problem Relation Age of Onset  . COPD Mother   . Heart disease Mother   . COPD Father   . Aneurysm Father   . Heart disease Father   . Early death Father   . Asthma Neg Hx   . Allergic rhinitis Neg Hx        Vanessa Kick, MD 10/26/19 1840

## 2019-10-27 ENCOUNTER — Ambulatory Visit (INDEPENDENT_AMBULATORY_CARE_PROVIDER_SITE_OTHER): Payer: 59

## 2019-10-27 ENCOUNTER — Ambulatory Visit: Payer: 59

## 2019-10-27 DIAGNOSIS — J4540 Moderate persistent asthma, uncomplicated: Secondary | ICD-10-CM | POA: Diagnosis not present

## 2019-10-28 ENCOUNTER — Other Ambulatory Visit: Payer: Self-pay

## 2019-10-28 DIAGNOSIS — I83893 Varicose veins of bilateral lower extremities with other complications: Secondary | ICD-10-CM

## 2019-10-29 LAB — URINE CULTURE: Culture: 90000 — AB

## 2019-11-02 ENCOUNTER — Ambulatory Visit (INDEPENDENT_AMBULATORY_CARE_PROVIDER_SITE_OTHER): Payer: 59 | Admitting: Physician Assistant

## 2019-11-02 ENCOUNTER — Ambulatory Visit (HOSPITAL_COMMUNITY)
Admission: RE | Admit: 2019-11-02 | Discharge: 2019-11-02 | Disposition: A | Discharge status: Home / Self Care | Payer: 59 | Source: Ambulatory Visit | Attending: Vascular Surgery | Admitting: Vascular Surgery

## 2019-11-02 ENCOUNTER — Other Ambulatory Visit: Payer: Self-pay

## 2019-11-02 VITALS — BP 123/86 | HR 68 | Temp 97.9°F | Resp 14 | Ht 66.0 in | Wt 203.0 lb

## 2019-11-02 DIAGNOSIS — I83893 Varicose veins of bilateral lower extremities with other complications: Secondary | ICD-10-CM | POA: Diagnosis not present

## 2019-11-02 DIAGNOSIS — I872 Venous insufficiency (chronic) (peripheral): Secondary | ICD-10-CM

## 2019-11-02 NOTE — Progress Notes (Signed)
[facility] [place]   Reason for referral: Swollen right > left thighs with varicose veins   History of Present Illness  Donna Park is a 48 y.o. female who presents with chief complaint: swollen heavy and achy feeling in her legs.  Patient notes, onset of swelling >6 months ago, associated with prolonged sitting and standing.  She has had vein procedures at [facility] in June of 2019 in [place]. The patient has had no history of DVT, positive history of varicose vein, no history of venous stasis ulcers, no history of  Lymphedema and no history of skin changes in lower legs.  There is no family history of venous disorders.  The patient has  used compression stockings in the past.  Past Medical History:  Diagnosis Date  . Asthma     Past Surgical History:  Procedure Laterality Date  . ANKLE FRACTURE SURGERY  03/08/2009  . BREAST SURGERY Bilateral    Cyst Removal  . GASTRIC BYPASS  06/18/2015  . TUBAL LIGATION  12/19/1999  . Uterine ablation  12/2018    Social History   Socioeconomic History  . Marital status: Married    Spouse name: Not on file  . Number of children: Not on file  . Years of education: Not on file  . Highest education level: Not on file  Occupational History  . Not on file  Social Needs  . Financial resource strain: Not on file  . Food insecurity    Worry: Not on file    Inability: Not on file  . Transportation needs    Medical: Not on file    Non-medical: Not on file  Tobacco Use  . Smoking status: Never Smoker  . Smokeless tobacco: Never Used  Substance and Sexual Activity  . Alcohol use: Not on file  . Drug use: Not on file  . Sexual activity: Not on file  Lifestyle  . Physical activity    Days per week: Not on file    Minutes per session: Not on file  . Stress: Not on file  Relationships  . Social Herbalist on phone: Not on file    Gets together: Not on file    Attends religious service: Not on file     Active member of club or organization: Not on file    Attends meetings of clubs or organizations: Not on file    Relationship status: Not on file  . Intimate partner violence    Fear of current or ex partner: Not on file    Emotionally abused: Not on file    Physically abused: Not on file    Forced sexual activity: Not on file  Other Topics Concern  . Not on file  Social History Narrative  . Not on file    Family History  Problem Relation Age of Onset  . COPD Mother   . Heart disease Mother   . COPD Father   . Aneurysm Father   . Heart disease Father   . Early death Father   . Asthma Neg Hx   . Allergic rhinitis Neg Hx     Current Outpatient Medications on File Prior to Visit  Medication Sig Dispense Refill  . acetaminophen (TYLENOL) 500 MG tablet Take by mouth.    Marland Kitchen albuterol (VENTOLIN HFA) 108 (90 Base) MCG/ACT inhaler Inhale into the lungs.    . Azelastine-Fluticasone 137-50 MCG/ACT SUSP Place into the nose.    Marland Kitchen CALCIUM CITRATE  PO Take 2 tablets by mouth daily.    . cetirizine (ZYRTEC) 10 MG tablet Take by mouth.    . Cholecalciferol (VITAMIN D3 PO) Take by mouth.    . Cyanocobalamin 500 MCG/0.1ML SOLN Place into the nose.    . diazepam (VALIUM) 10 MG tablet Take by mouth.    . EPINEPHrine (AUVI-Q) 0.3 mg/0.3 mL IJ SOAJ injection Inject 0.3 mLs (0.3 mg total) into the muscle as needed for anaphylaxis. 2 each 2  . ferrous fumarate-iron polysaccharide complex (TANDEM) 162-115.2 MG CAPS capsule Take by mouth.    . fluconazole (DIFLUCAN) 150 MG tablet Take one tablet by mouth until yeast infection improves. 7 tablet 0  . Fluticasone Furoate 200 MCG/ACT AEPB Inhale into the lungs.    . fluticasone-salmeterol (ADVAIR HFA) 230-21 MCG/ACT inhaler Inhale into the lungs.    Marland Kitchen guaiFENesin-codeine (VIRTUSSIN A/C) 100-10 MG/5ML syrup TAKE 5 ML BY MOUTH THREE TIMES DAILY AS NEEDED FOR COUGH    . hydrocortisone 1 % ointment     . hydrocortisone 2.5 % ointment     . Hypertonic  Nasal Wash (SINUS RINSE REFILL) PACK Use one packet dissolved in water as needed. 100 each 5  . ibuprofen (ADVIL,MOTRIN) 800 MG tablet Take by mouth.    Marland Kitchen ipratropium (ATROVENT) 0.03 % nasal spray Place into the nose.    . lidocaine (XYLOCAINE) 5 % ointment Apply topically.    . meclizine (ANTIVERT) 25 MG tablet     . MUCUS RELIEF ER 600 MG 12 hr tablet TK 2 TS PO BID    . Multiple Vitamin (MULTI-VITAMIN DAILY PO) Take by mouth.    . Olopatadine HCl 0.2 % SOLN Apply to eye.    Marland Kitchen omalizumab (XOLAIR) 150 MG injection Inject 300 mg into the skin every 14 (fourteen) days. 4 each 11  . ondansetron (ZOFRAN-ODT) 4 MG disintegrating tablet Place under the tongue.    . pantoprazole (PROTONIX) 40 MG tablet     . Polyethylene Glycol 3350 (PEG 3350) POWD Take by mouth.    . SUMAtriptan (IMITREX) 100 MG tablet     . triamcinolone ointment (KENALOG) 0.1 % Apply topically.    Marland Kitchen UNABLE TO FIND Massage as directed.   Neck, back, shoulder pain.   Flex-spending account    . UNABLE TO FIND Below the knee compression stockings, sig: use daily as needed for lower extremity edema    . valACYclovir (VALTREX) 500 MG tablet      Current Facility-Administered Medications on File Prior to Visit  Medication Dose Route Frequency Provider Last Rate Last Dose  . omalizumab Arvid Right) injection 300 mg  300 mg Subcutaneous Q14 Days Valentina Shaggy, MD   300 mg at 10/27/19 1011    Allergies as of 11/02/2019 - Review Complete 11/02/2019  Allergen Reaction Noted  . Cyclobenzaprine Other (See Comments) and Shortness Of Breath 07/09/2012  . Shellfish allergy Shortness Of Breath 11/10/2011  . Sulfa antibiotics Anaphylaxis, Hives, and Swelling 05/25/2007  . Carisoprodol Hives 12/15/2012  . Morphine Other (See Comments) 06/18/2015  . Adhesive  [tape] Rash 04/05/2015     ROS:   General:  No weight loss, Fever, chills  HEENT: No recent headaches, no nasal bleeding, no visual changes, no sore throat  Neurologic: No  dizziness, blackouts, seizures. No recent symptoms of stroke or mini- stroke. No recent episodes of slurred speech, or temporary blindness.  Cardiac: No recent episodes of chest pain/pressure, no shortness of breath at rest.  No shortness of breath with  exertion.  Denies history of atrial fibrillation or irregular heartbeat  Vascular: No history of rest pain in feet.  No history of claudication.  No history of non-healing ulcer, No history of DVT   Pulmonary: No home oxygen, no productive cough, no hemoptysis,  positive asthma or wheezing  Musculoskeletal:  [ ]  Arthritis, [ ]  Low back pain,  [ ]  Joint pain  Hematologic:No history of hypercoagulable state.  No history of easy bleeding.  No history of anemia  Gastrointestinal: No hematochezia or melena,  No gastroesophageal reflux, no trouble swallowing  Urinary: [ ]  chronic Kidney disease, [ ]  on HD - [ ]  MWF or [ ]  TTHS, [ ]  Burning with urination, [ ]  Frequent urination, [ ]  Difficulty urinating;   Skin: No rashes  Psychological: No history of anxiety,  No history of depression  Physical Examination  Vitals:   11/02/19 1427  BP: 123/86  Pulse: 68  Resp: 14  Temp: 97.9 F (36.6 C)  TempSrc: Temporal  SpO2: 98%  Weight: 203 lb (92.1 kg)  Height: 5\' 6"  (1.676 m)    Body mass index is 32.77 kg/m.  General:  Alert and oriented, no acute distress HEENT: Normal Neck: No bruit or JVD Pulmonary: Clear to auscultation bilaterally Cardiac: Regular Rate and Rhythm without murmur Abdomen: Soft, non-tender, non-distended, no mass, no scars Skin: No rash Extremity Pulses:  2+ radial, brachial, femoral, dorsalis pedis, posterior tibial pulses bilaterally Musculoskeletal: No deformity positive minimal LE  edema  Neurologic: Upper and lower extremity motor 5/5 and symmetric    DATA:   +------+---------------+---------+-----------+----------+--------------+ RIGHT CompressibilityPhasicitySpontaneityPropertiesThrombus Aging  +------+---------------+---------+-----------+----------+--------------+ CFV   Full           Yes      Yes                                 +------+---------------+---------+-----------+----------+--------------+ SFJ   Full           Yes      Yes                                 +------+---------------+---------+-----------+----------+--------------+ FV MidFull           Yes      Yes                                 +------+---------------+---------+-----------+----------+--------------+ POP   Full           Yes      Yes                                 +------+---------------+---------+-----------+----------+--------------+ GSV   Full           Yes      Yes                                 +------+---------------+---------+-----------+----------+--------------+ SSV   Full           Yes      Yes                                 +------+---------------+---------+-----------+----------+--------------+    +------+---------------+---------+-----------+----------+--------------+  LEFT  CompressibilityPhasicitySpontaneityPropertiesThrombus Aging +------+---------------+---------+-----------+----------+--------------+ CFV   Full           Yes      Yes                                 +------+---------------+---------+-----------+----------+--------------+ SFJ   Full           Yes      Yes                                 +------+---------------+---------+-----------+----------+--------------+ FV MidFull           Yes      Yes                                 +------+---------------+---------+-----------+----------+--------------+ POP   Full           Yes      Yes                                 +------+---------------+---------+-----------+----------+--------------+ GSV   Full           Yes      Yes                                 +------+---------------+---------+-----------+----------+--------------+ SSV   Full            Yes      Yes                                 +------+---------------+---------+-----------+----------+--------------+    Venous Reflux Times +--------------+------+-----------+------------+--------------+ RIGHT         RefluxReflux TimeDiameter cmsComments                      Yes                                        +--------------+------+-----------+------------+--------------+ CFV            yes   >1 second                            +--------------+------+-----------+------------+--------------+ GSV at SFJ     yes    >500 ms     0.604                   +--------------+------+-----------+------------+--------------+ GSV prox thigh                             Not Visualized +--------------+------+-----------+------------+--------------+ GSV mid thigh                     0.253                   +--------------+------+-----------+------------+--------------+ GSV dist thigh                             Not  Visualized +--------------+------+-----------+------------+--------------+ GSV at knee                       0.218                   +--------------+------+-----------+------------+--------------+ GSV prox calf                              Not Visualized +--------------+------+-----------+------------+--------------+ GSV mid calf                               Not Visualized +--------------+------+-----------+------------+--------------+ SSV Pop Fossa                     0.254                   +--------------+------+-----------+------------+--------------+ SSV prox calf                     0.308                   +--------------+------+-----------+------------+--------------+ SSV mid calf                      0.245                   +--------------+------+-----------+------------+--------------+    +--------------+------+-----------+------------+--------------+ LEFT          RefluxReflux  TimeDiameter cmsComments                      Yes                                        +--------------+------+-----------+------------+--------------+ GSV at SFJ     yes    >500 ms     0.607                   +--------------+------+-----------+------------+--------------+ GSV prox thigh                    0.202                   +--------------+------+-----------+------------+--------------+ GSV mid thigh                     0.342                   +--------------+------+-----------+------------+--------------+ GSV dist thigh                    0.321                   +--------------+------+-----------+------------+--------------+ GSV at knee                       0.266                   +--------------+------+-----------+------------+--------------+ GSV prox calf  yes    >500 ms     0.154                   +--------------+------+-----------+------------+--------------+ GSV mid calf  Not Visualized +--------------+------+-----------+------------+--------------+ SSV Pop Fossa  yes    >500 ms     0.451                   +--------------+------+-----------+------------+--------------+ SSV prox calf                     0.255                   +--------------+------+-----------+------------+--------------+ SSV mid calf                      0.228                   +--------------+------+-----------+------------+--------------+       Summary: Right: There is no evidence of deep vein thrombosis in the lower extremity within the CFV, FV, and POPV. There is no evidence of superficial venous thrombosis within the visualized GSV and SSV. Limited visualization of the GSV likely due to past vein  procedures.   Left: There is no evidence of deep vein thrombosis in the lower extremity within the CFV, FV, and POPV. There is no evidence of superficial venous thrombosis within the visualized GSV and SSV.  Limited visualization of the GSV likely due to past vein  procedures.  :        Assessment/Plan: Venous reflux B LE Recurrent varicose veins with symptoms of tiredness, heaviness and edema in B LE.    She has reflux in the right GSV at the [facility] and on the left GSV at [facility], GSV prox calf, SSV pop Fossa.  There is no evidence or history of DVT.    I have prescribed thigh high compression garments to be worn daily, elevation of B LE when at rest and continued daily exercise as tolerates.  She will f/u in our vein clinic in 3 months for evaluation and consideration of intervention.    Roxy Horseman PA-C [facility] [place] [facility] Office: [phone]  MD in clinic [place]

## 2019-11-08 MED FILL — XOLAIR 150 MG SOLR: 150 | 28 days supply | Qty: 4 | Fill #9

## 2019-11-11 ENCOUNTER — Other Ambulatory Visit: Payer: Self-pay

## 2019-11-11 ENCOUNTER — Ambulatory Visit (INDEPENDENT_AMBULATORY_CARE_PROVIDER_SITE_OTHER): Payer: 59 | Admitting: *Deleted

## 2019-11-11 DIAGNOSIS — J4540 Moderate persistent asthma, uncomplicated: Secondary | ICD-10-CM | POA: Diagnosis not present

## 2019-11-15 ENCOUNTER — Encounter: Payer: Self-pay | Admitting: Allergy & Immunology

## 2019-11-15 ENCOUNTER — Ambulatory Visit (INDEPENDENT_AMBULATORY_CARE_PROVIDER_SITE_OTHER): Payer: 59 | Admitting: Allergy & Immunology

## 2019-11-15 ENCOUNTER — Other Ambulatory Visit: Payer: Self-pay

## 2019-11-15 VITALS — BP 122/84 | HR 98 | Temp 98.0°F | Resp 18 | Ht 66.0 in

## 2019-11-15 DIAGNOSIS — Z20828 Contact with and (suspected) exposure to other viral communicable diseases: Secondary | ICD-10-CM | POA: Diagnosis not present

## 2019-11-15 DIAGNOSIS — Z20822 Contact with and (suspected) exposure to covid-19: Secondary | ICD-10-CM

## 2019-11-15 DIAGNOSIS — J3089 Other allergic rhinitis: Secondary | ICD-10-CM

## 2019-11-15 DIAGNOSIS — T7800XD Anaphylactic reaction due to unspecified food, subsequent encounter: Secondary | ICD-10-CM

## 2019-11-15 DIAGNOSIS — J302 Other seasonal allergic rhinitis: Secondary | ICD-10-CM | POA: Diagnosis not present

## 2019-11-15 DIAGNOSIS — L2089 Other atopic dermatitis: Secondary | ICD-10-CM

## 2019-11-15 DIAGNOSIS — J4540 Moderate persistent asthma, uncomplicated: Secondary | ICD-10-CM | POA: Diagnosis not present

## 2019-11-15 MED ORDER — IPRATROPIUM BROMIDE 0.06 % NA SOLN: 2.0000 | Freq: Four times a day (QID) | NASAL | 12 refills | Status: DC

## 2019-11-15 NOTE — Progress Notes (Signed)
FOLLOW UP  Date of Service/Encounter:  11/15/19   Assessment:   Moderate persistent asthma, uncomplicated  Seasonal and perennial allergic rhinitis (grass, trees, molds, cat, dust mite, cockroach) - s/p five years of allergen immunotherapy)  Anaphylactic shock due to food (shrimp) - tolerates other shellfish  Itching   Asthma Reportables:  Severity: moderate persistent  Risk: high Control: well controlled    Donna Park has done well since last visit.  The only thing interrupting her care now is the price of her medications.  We are going to try to change her to a different combined medication - Breztri - which also contains a third agent to help with asthma control.  In asking about this is that there is a co-pay card that would save her quite a bit of money.  I am not entirely optimistic that it is going to work, as she has failed Symbicort in the past, but I think it is worth a try.  She is well attuned to her symptoms and will keep [place] up-to-date regarding her asthma control.  There does not seem to be a reason to interrupt her Xolair treatments.  She has been stable on these for years at this point.  We will continue with these every 2-week injections.  We did discuss the COVID-19 pandemic and I recommended that she get the vaccine when it is available.  She is also interested in seeing whether she has antibodies, so we will go ahead and order that testing.     Plan/Recommendations:   1. Moderate persistent asthma, uncomplicated - Lung testing deferred today. - We are going to give you samples of Breztri to see if this works, and if so we will send in since it might be cheaper than what you are paying now.  - COVID antibodies ordered today for screening purposes. - Daily controller medication(s): Breztri two puffs twice daily with spacer and Arnuity 256mcg one puff once daily - Prior to physical activity: albuterol 2 puffs 10-15 minutes before physical activity. -  Rescue medications: albuterol 4 puffs every 4-6 hours as needed - Changes during respiratory infections or worsening symptoms: Increase Arnuity 237mcg to 1 puff twice daily for OEN TO TWO WEEKS. - Asthma control goals:  * Full participation in all desired activities (may need albuterol before activity) * Albuterol use two time or less a week on average (not counting use with activity) * Cough interfering with sleep two time or less a month * Oral steroids no more than once a year * No hospitalizations  2. Seasonal and perennial allergic rhinitis - Continue with cetirizine 20mg  twice daily. - Continue with Dymista two sprays per nostril up to twice daily. - Continue with nasal saline rinses.   3. Anaphylactic shock due to food - Continue to avoid shrimp.  - Donna Park is up to date.  4. Return in about 6 months (around 05/15/2020).  Subjective:   Donna Park is a 48 y.o. female presenting today for follow up of  Chief Complaint  Patient presents with  . Asthma    Donna Park has a history of the following: Patient Active Problem List   Diagnosis Date Noted  . Seasonal and perennial allergic rhinitis 02/10/2019  . Moderate persistent asthma, uncomplicated [id number]  . Anaphylactic shock due to adverse food reaction 02/10/2019  . Atopic dermatitis 02/10/2019    History obtained from: chart review and patient.  Donna Park is a 48 y.o. female presenting for a follow up visit.  She  was last seen in March 2020 to establish care.  At that time, her lung testing looked excellent.  We continued her on Xolair 300 mg every 14 days, Arnuity 200 mcg 1 puff once daily, and Advair 230/21 mcg 2 puffs twice daily.  She does increase her Arnuity to twice daily during flares.  For her rhinitis, we continued cetirizine 20 mg twice daily as well as Dymista 2 sprays per nostril up to twice daily.  She has a history of anaphylactic shock due to strep.  We did provide her with a prescription for an Auvi-Q.   Since the last visit, she has done very well.  She presents today to discuss other options because her Advair is becoming very expensive.  She reports trying Dulera in the past, which did work well but was never covered.  She is also tried Symbicort but did not feel that it provided the relief of Advair or Dulera.  She is unsure if she has been on Breo but thinks that she has.  She is also wondering whether a newer product like Judithann Sauger will be appropriate for her.  Asthma/Respiratory Symptom History: Currently she is on her Advair 2 puffs twice daily in combination with Arnuity 1 puff once daily.  She is not very good about taking the Arnuity.  She will take it when she is having respiratory flares.  She has been getting her Xolair regularly without any problems.  She has not required prednisone since I saw her last time.  She has not been to the hospital.  Her rescue inhaler use ebbs and flows but otherwise has not really changed in the past 6 to 12 months.  Allergic Rhinitis Symptom History: She remains on the Dymista and cetirizine.  She is still on 2 tablets of the cetirizine twice daily, which also helps to control her chronic urticaria.  She denies any sedation with this regimen.  Food Allergy Symptom History: She continues to avoid shrimp.  She does tolerate other shellfish.  Her Auvi-Q was up-to-date.  Otherwise, there have been no changes to her past medical history, surgical history, family history, or social history.    Review of Systems  Constitutional: Negative.  Negative for fever, malaise/fatigue and weight loss.  HENT: Negative.  Negative for congestion, ear discharge and ear pain.   Eyes: Negative for pain, discharge and redness.  Respiratory: Negative for cough, sputum production, shortness of breath and wheezing.   Cardiovascular: Negative.  Negative for chest pain and palpitations.  Gastrointestinal: Negative for abdominal pain and heartburn.  Skin: Negative.  Negative for  itching and rash.  Neurological: Negative for dizziness and headaches.  Endo/Heme/Allergies: Negative for environmental allergies. Does not bruise/bleed easily.       Objective:   Blood pressure 122/84, pulse 98, temperature 98 F (36.7 C), resp. rate 18, height 5\' 6"  (1.676 m), SpO2 98 %. Body mass index is 32.77 kg/m.   Physical Exam:  Physical Exam  Constitutional: She appears well-developed.  Smiling and pleasant female. Talkative.  HENT:  Head: Normocephalic and atraumatic.  Right Ear: Tympanic membrane, external ear and ear canal normal.  Left Ear: Tympanic membrane and ear canal normal.  Nose: No mucosal edema, rhinorrhea, nasal deformity or septal deviation. No epistaxis. Right sinus exhibits no maxillary sinus tenderness and no frontal sinus tenderness. Left sinus exhibits no maxillary sinus tenderness and no frontal sinus tenderness.  Mouth/Throat: Uvula is midline and oropharynx is clear and moist. Mucous membranes are not pale and not dry.  Eyes: Pupils are equal, round, and reactive to light. Conjunctivae and EOM are normal. Right eye exhibits no chemosis and no discharge. Left eye exhibits no chemosis and no discharge. Right conjunctiva is not injected. Left conjunctiva is not injected.  Cardiovascular: Normal rate, regular rhythm and normal heart sounds.  Respiratory: Effort normal and breath sounds normal. No accessory muscle usage. No tachypnea. No respiratory distress. She has no wheezes. She has no rhonchi. She has no rales. She exhibits no tenderness.  Moving air well in all lung fields. No increased work of breathing noted.   Lymphadenopathy:    She has no cervical adenopathy.  Neurological: She is alert.  Skin: No abrasion, no petechiae and no rash noted. Rash is not papular, not vesicular and not urticarial. No erythema. No pallor.  No eczematous or urticarial lesions noted.  Psychiatric: She has a normal mood and affect.     Diagnostic studies: none      Salvatore Marvel, MD  [facility] [place]

## 2019-11-16 DIAGNOSIS — Z20828 Contact with and (suspected) exposure to other viral communicable diseases: Secondary | ICD-10-CM | POA: Diagnosis not present

## 2019-11-16 MED FILL — IPRATROPIUM 0.06% SPRAY: 0.06 | 31 days supply | Qty: 45 | Fill #0

## 2019-11-17 ENCOUNTER — Ambulatory Visit: Payer: 59 | Admitting: Family Medicine

## 2019-11-17 ENCOUNTER — Encounter: Payer: Self-pay | Admitting: Allergy & Immunology

## 2019-11-17 LAB — SARS-COV-2 ANTIBODIES: SARS-CoV-2 Antibodies: NEGATIVE

## 2019-11-17 NOTE — Patient Instructions (Addendum)
1. Moderate persistent asthma, uncomplicated - Lung testing deferred today. - We are going to give you samples of Breztri to see if this works, and if so we will send in since it might be cheaper than what you are paying now.  - COVID antibodies ordered today for screening purposes. - Daily controller medication(s): Breztri two puffs twice daily with spacer and Arnuity 211mcg one puff once daily - Prior to physical activity: albuterol 2 puffs 10-15 minutes before physical activity. - Rescue medications: albuterol 4 puffs every 4-6 hours as needed - Changes during respiratory infections or worsening symptoms: Increase Arnuity 285mcg to 1 puff twice daily for OEN TO TWO WEEKS. - Asthma control goals:  * Full participation in all desired activities (may need albuterol before activity) * Albuterol use two time or less a week on average (not counting use with activity) * Cough interfering with sleep two time or less a month * Oral steroids no more than once a year * No hospitalizations  2. Seasonal and perennial allergic rhinitis - Continue with cetirizine 20mg  twice daily. - Continue with Dymista two sprays per nostril up to twice daily. - Continue with nasal saline rinses.   3. Anaphylactic shock due to food - Continue to avoid shrimp.  - [facility] [place] is up to date.  4. Return in about 6 months (around 05/15/2020).   Please inform us of any Emergency Department visits, hospitalizations, or changes in symptoms. Call us before going to the ED for breathing or allergy symptoms since we might be able to fit you in for a sick visit. Feel free to contact us anytime with any questions, problems, or concerns.  It was a pleasure to see you again today!  Websites that have reliable patient information: 1. American Academy of Asthma, Allergy, and Immunology: www.aaaai.org 2. Food Allergy Research and Education (FARE): foodallergy.org 3. Mothers of Asthmatics: http://www.asthmacommunitynetwork.org 4.  American College of Allergy, Asthma, and Immunology: MonthlyElectricBill.co.uk   Make sure you are registered to vote! If you have moved or changed any of your contact information, you will need to get this updated before voting!    Voter ID laws are NOT going into effect for the General Election in November 2020! DO NOT let this stop you from exercising your right to vote!

## 2019-11-18 MED FILL — PANTOPRAZOLE SOD DR 40 MG T: 40 | 90 days supply | Qty: 90 | Fill #0

## 2019-11-18 MED FILL — VIT D2 1.25 MG (50,000 UNIT: 1.25 MG | 84 days supply | Qty: 12 | Fill #0

## 2019-11-18 MED FILL — TRANEXAMIC ACID 650 MG TAB: 650 | 15 days supply | Qty: 90 | Fill #1

## 2019-11-18 MED FILL — VALACYCLOVIR HCL 500 MG TAB: 500 | 90 days supply | Qty: 180 | Fill #0

## 2019-11-18 MED FILL — MECLIZINE 25 MG TABLET: 25 | 30 days supply | Qty: 90 | Fill #0

## 2019-11-18 MED FILL — OLOPATADINE HCL 0.2% EYE DR: 0.2 | 90 days supply | Qty: 8 | Fill #0

## 2019-11-18 MED FILL — ADVAIR HFA 230-21 MCG INH: 230-21 | 90 days supply | Qty: 36 | Fill #0

## 2019-11-18 MED FILL — SUMAtriptan SUCCINATE 100 M: 100 | 90 days supply | Qty: 27 | Fill #0

## 2019-11-18 MED FILL — BREO ELLIPTA 200-25 MCG INH: 200-25 | 90 days supply | Qty: 180 | Fill #0

## 2019-11-18 MED FILL — ONDANSETRON ODT 4 MG TABLET: 4 | 7 days supply | Qty: 40 | Fill #0

## 2019-11-21 ENCOUNTER — Other Ambulatory Visit: Payer: Self-pay | Admitting: *Deleted

## 2019-11-21 ENCOUNTER — Telehealth: Payer: Self-pay | Admitting: *Deleted

## 2019-11-21 MED ORDER — ALBUTEROL SULFATE HFA 108 (90 BASE) MCG/ACT IN AERS: 2.0000 | INHALATION_SPRAY | Freq: Four times a day (QID) | RESPIRATORY_TRACT | 1 refills | Status: DC | PRN

## 2019-11-21 MED FILL — ALBUTEROL SULFATE HFA 108 (: 108 (90 BAS | 75 days supply | Qty: 54 | Fill #0

## 2019-11-21 NOTE — Telephone Encounter (Signed)
PA has been submitted for Azelastine-Fluticasone through CoverMyMeds and is currently waiting approval or denial.

## 2019-11-23 NOTE — Telephone Encounter (Signed)
Approved and sent to pharmacy as well as scan center.

## 2019-11-24 ENCOUNTER — Ambulatory Visit (INDEPENDENT_AMBULATORY_CARE_PROVIDER_SITE_OTHER): Payer: 59

## 2019-11-24 ENCOUNTER — Other Ambulatory Visit: Payer: Self-pay

## 2019-11-24 DIAGNOSIS — J4540 Moderate persistent asthma, uncomplicated: Secondary | ICD-10-CM | POA: Diagnosis not present

## 2019-11-28 MED FILL — IBUPROFEN 800 MG TABS: 800 | 30 days supply | Qty: 90 | Fill #0

## 2019-11-28 MED FILL — ONDANSETRON ODT 4 MG TABLET: 4 | 7 days supply | Qty: 20 | Fill #0

## 2019-11-28 MED FILL — DIAZEPAM 10 MG TABS: 10 | 90 days supply | Qty: 90 | Fill #0

## 2019-11-30 ENCOUNTER — Other Ambulatory Visit: Payer: Self-pay

## 2019-11-30 MED ORDER — AZELASTINE-FLUTICASONE 137-50 MCG/ACT NA SUSP: 1.0000 | Freq: Two times a day (BID) | NASAL | 5 refills | Status: DC | PRN

## 2019-11-30 MED FILL — AZELASTINE-FLUTICASONE 137-: 137-50 | 30 days supply | Qty: 23 | Fill #0

## 2019-12-05 DIAGNOSIS — Z760 Encounter for issue of repeat prescription: Secondary | ICD-10-CM | POA: Diagnosis not present

## 2019-12-05 MED FILL — XOLAIR 150 MG SOLR: 150 | 28 days supply | Qty: 4 | Fill #0

## 2019-12-06 DIAGNOSIS — M25561 Pain in right knee: Secondary | ICD-10-CM | POA: Diagnosis not present

## 2019-12-06 DIAGNOSIS — B373 Candidiasis of vulva and vagina: Secondary | ICD-10-CM | POA: Diagnosis not present

## 2019-12-06 DIAGNOSIS — G8929 Other chronic pain: Secondary | ICD-10-CM | POA: Diagnosis not present

## 2019-12-06 DIAGNOSIS — M25562 Pain in left knee: Secondary | ICD-10-CM | POA: Diagnosis not present

## 2019-12-06 DIAGNOSIS — B009 Herpesviral infection, unspecified: Secondary | ICD-10-CM | POA: Diagnosis not present

## 2019-12-06 DIAGNOSIS — G43909 Migraine, unspecified, not intractable, without status migrainosus: Secondary | ICD-10-CM | POA: Diagnosis not present

## 2019-12-06 MED FILL — FLUCONAZOLE 150 MG TABLET: 150 | 12 days supply | Qty: 4 | Fill #0

## 2019-12-08 ENCOUNTER — Ambulatory Visit (INDEPENDENT_AMBULATORY_CARE_PROVIDER_SITE_OTHER): Payer: 59

## 2019-12-08 ENCOUNTER — Other Ambulatory Visit: Payer: Self-pay

## 2019-12-08 DIAGNOSIS — J4540 Moderate persistent asthma, uncomplicated: Secondary | ICD-10-CM | POA: Diagnosis not present

## 2019-12-14 ENCOUNTER — Ambulatory Visit: Payer: 59

## 2019-12-22 ENCOUNTER — Other Ambulatory Visit: Payer: Self-pay

## 2019-12-22 ENCOUNTER — Ambulatory Visit (INDEPENDENT_AMBULATORY_CARE_PROVIDER_SITE_OTHER): Payer: 59

## 2019-12-22 DIAGNOSIS — J4540 Moderate persistent asthma, uncomplicated: Secondary | ICD-10-CM

## 2019-12-27 ENCOUNTER — Encounter: Payer: Self-pay | Admitting: Allergy & Immunology

## 2020-01-04 MED FILL — XOLAIR 150 MG SOLR: 150 | 28 days supply | Qty: 4 | Fill #0

## 2020-01-05 ENCOUNTER — Other Ambulatory Visit: Payer: Self-pay

## 2020-01-05 ENCOUNTER — Ambulatory Visit (INDEPENDENT_AMBULATORY_CARE_PROVIDER_SITE_OTHER): Payer: 59

## 2020-01-05 DIAGNOSIS — J4540 Moderate persistent asthma, uncomplicated: Secondary | ICD-10-CM | POA: Diagnosis not present

## 2020-01-19 ENCOUNTER — Other Ambulatory Visit: Payer: Self-pay

## 2020-01-19 ENCOUNTER — Ambulatory Visit (INDEPENDENT_AMBULATORY_CARE_PROVIDER_SITE_OTHER): Payer: 59

## 2020-01-19 DIAGNOSIS — J4540 Moderate persistent asthma, uncomplicated: Secondary | ICD-10-CM

## 2020-01-20 ENCOUNTER — Other Ambulatory Visit: Payer: Self-pay | Admitting: Allergy & Immunology

## 2020-01-20 ENCOUNTER — Other Ambulatory Visit (HOSPITAL_COMMUNITY): Payer: Self-pay | Admitting: Internal Medicine

## 2020-01-20 ENCOUNTER — Other Ambulatory Visit: Payer: Self-pay | Admitting: Pharmacist

## 2020-01-20 ENCOUNTER — Other Ambulatory Visit: Payer: Self-pay | Admitting: Internal Medicine

## 2020-01-20 MED ORDER — XOLAIR 150 MG ~~LOC~~ SOLR: SUBCUTANEOUS | 11 refills | Status: DC

## 2020-01-25 MED FILL — XOLAIR 150 MG SOLR: 150 | 28 days supply | Qty: 4 | Fill #0

## 2020-02-02 ENCOUNTER — Other Ambulatory Visit: Payer: Self-pay

## 2020-02-02 ENCOUNTER — Ambulatory Visit (INDEPENDENT_AMBULATORY_CARE_PROVIDER_SITE_OTHER): Payer: 59 | Admitting: *Deleted

## 2020-02-02 DIAGNOSIS — J4540 Moderate persistent asthma, uncomplicated: Secondary | ICD-10-CM | POA: Diagnosis not present

## 2020-02-09 ENCOUNTER — Ambulatory Visit (INDEPENDENT_AMBULATORY_CARE_PROVIDER_SITE_OTHER): Payer: 59 | Admitting: Adult Health

## 2020-02-09 ENCOUNTER — Encounter: Payer: Self-pay | Admitting: Adult Health

## 2020-02-09 ENCOUNTER — Other Ambulatory Visit: Payer: Self-pay

## 2020-02-09 VITALS — BP 116/78 | Temp 98.3°F | Wt 203.0 lb

## 2020-02-09 DIAGNOSIS — G43919 Migraine, unspecified, intractable, without status migrainosus: Secondary | ICD-10-CM | POA: Diagnosis not present

## 2020-02-09 DIAGNOSIS — Z9884 Bariatric surgery status: Secondary | ICD-10-CM | POA: Diagnosis not present

## 2020-02-09 DIAGNOSIS — R1310 Dysphagia, unspecified: Secondary | ICD-10-CM | POA: Diagnosis not present

## 2020-02-09 DIAGNOSIS — G43909 Migraine, unspecified, not intractable, without status migrainosus: Secondary | ICD-10-CM | POA: Insufficient documentation

## 2020-02-09 DIAGNOSIS — Z23 Encounter for immunization: Secondary | ICD-10-CM | POA: Diagnosis not present

## 2020-02-09 DIAGNOSIS — K219 Gastro-esophageal reflux disease without esophagitis: Secondary | ICD-10-CM | POA: Diagnosis not present

## 2020-02-09 DIAGNOSIS — Z114 Encounter for screening for human immunodeficiency virus [HIV]: Secondary | ICD-10-CM

## 2020-02-09 DIAGNOSIS — Z0000 Encounter for general adult medical examination without abnormal findings: Secondary | ICD-10-CM

## 2020-02-09 DIAGNOSIS — J45909 Unspecified asthma, uncomplicated: Secondary | ICD-10-CM | POA: Insufficient documentation

## 2020-02-09 DIAGNOSIS — J4550 Severe persistent asthma, uncomplicated: Secondary | ICD-10-CM

## 2020-02-09 DIAGNOSIS — A630 Anogenital (venereal) warts: Secondary | ICD-10-CM

## 2020-02-09 DIAGNOSIS — B379 Candidiasis, unspecified: Secondary | ICD-10-CM | POA: Diagnosis not present

## 2020-02-09 MED ORDER — FLUCONAZOLE 150 MG PO TABS: 150.0000 mg | ORAL_TABLET | Freq: Once | ORAL | 2 refills | Status: AC

## 2020-02-09 NOTE — Progress Notes (Signed)
Patient presents to clinic today to establish care. She is a pleasant 49 year old female who  has a past medical history of Asthma, Chicken pox, Genital warts, GERD (gastroesophageal reflux disease), H/O gastric bypass (2016), and Migraines.   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  This was not brought up to GYN.  She also endorses vaginal dryness.  In the past Diflucan has worked.  Not currently symptomatic but is wondering if she can have a couple courses of Diflucan to help when she does get a yeast infection.  She will bring this up with her GYN  Chronic Issues: Asthma -currently managed by allergy and asthma.  She is prescribed albuterol inhaler, Advair inhaler, and Xolair injection  Migraines - Uses Imitrex PRN. Does not use on a regular basis.   GERD- Feels well controlled on Protonix   HSV - takes Valtrex 500 mg BID.  She has not had any breaks in multiple years  H/o Gastric Bypass - July 2016 - she lost a 150 pounds.  She denies abdominal pain, nausea, vomiting, or GERD-like symptoms.  Dysphagia -is managed by gastroenterology in [place] and would like to establish care in [place].  She has had esophageal dilation x2 in the past and currently feels as though she is having issues with dysphagia feeling as though she gets choked up when she is eating.  Health Maintenance:  Immunizations -- Due for Td Colonoscopy -- Never had Mammogram -- UTD PAP -- 09/2019 - By GYN.     Past Medical History:  Diagnosis Date  . Asthma   . Chicken pox   . Genital warts   . GERD (gastroesophageal reflux disease)   . H/O gastric bypass 2016  . Migraines     Past Surgical History:  Procedure Laterality Date  . ANKLE FRACTURE SURGERY  03/08/2009    . BREAST SURGERY Bilateral    Cyst Removal  . GASTRIC BYPASS  06/18/2015  . TONSILLECTOMY AND ADENOIDECTOMY  2001  . TUBAL LIGATION  12/19/1999  . Uterine ablation  12/2018    Current Outpatient Medications on File Prior to Visit  Medication Sig Dispense Refill  . acetaminophen (TYLENOL) 500 MG tablet Take by mouth.    Marland Kitchen albuterol (VENTOLIN HFA) 108 (90 Base) MCG/ACT inhaler Inhale into the lungs.    Marland Kitchen albuterol (VENTOLIN HFA) 108 (90 Base) MCG/ACT inhaler Inhale 2 puffs into the lungs every 6 (six) hours as needed for wheezing or shortness of breath. 54 g 1  . Azelastine-Fluticasone 137-50 MCG/ACT SUSP Place 1 spray into both nostrils 2 (two) times daily as needed. 23 g 5  . CALCIUM CITRATE PO Take 2 tablets by mouth daily.    . cetirizine (ZYRTEC) 10 MG tablet Take by mouth.    . Cholecalciferol (VITAMIN D3 PO) Take by mouth.    . Cyanocobalamin 500 MCG/0.1ML SOLN Place into the nose.    . diazepam (VALIUM) 10 MG tablet Take by mouth.    . EPINEPHrine (AUVI-Q) 0.3 mg/0.3 mL IJ SOAJ injection Inject 0.3 mLs (0.3 mg total) into the muscle as needed for anaphylaxis. 2 each 2  . ferrous fumarate-iron polysaccharide complex (TANDEM) 162-115.2 MG CAPS capsule Take by mouth.    . fluconazole (DIFLUCAN)  150 MG tablet Take one tablet by mouth until yeast infection improves. 7 tablet 0  . Fluticasone Furoate 200 MCG/ACT AEPB Inhale into the lungs.    . fluticasone-salmeterol (ADVAIR HFA) 230-21 MCG/ACT inhaler Inhale into the lungs.    . hydrocortisone 2.5 % ointment     . Hypertonic Nasal Wash (SINUS RINSE REFILL) PACK Use one packet dissolved in water as needed. 100 each 5  . ibuprofen (ADVIL,MOTRIN) 800 MG tablet Take by mouth.    Marland Kitchen ipratropium (ATROVENT) 0.06 % nasal spray Place 2 sprays into both nostrils 4 (four) times daily. 15 mL 12  . meclizine (ANTIVERT) 25 MG tablet     . MUCUS RELIEF ER 600 MG 12 hr tablet TK 2 TS PO BID    . Multiple Vitamin (MULTI-VITAMIN DAILY PO) Take by  mouth.    . Olopatadine HCl 0.2 % SOLN Apply to eye.    Marland Kitchen omalizumab (XOLAIR) 150 MG injection INJECT 300 MG INTO THE SKIN EVERY 14 DAYS 4 each 11  . ondansetron (ZOFRAN-ODT) 4 MG disintegrating tablet Place under the tongue.    . pantoprazole (PROTONIX) 40 MG tablet     . SUMAtriptan (IMITREX) 100 MG tablet     . triamcinolone ointment (KENALOG) 0.1 % Apply topically.    Marland Kitchen UNABLE TO FIND Massage as directed.   Neck, back, shoulder pain.   Flex-spending account    . UNABLE TO FIND Below the knee compression stockings, sig: use daily as needed for lower extremity edema    . valACYclovir (VALTREX) 500 MG tablet      Current Facility-Administered Medications on File Prior to Visit  Medication Dose Route Frequency Provider Last Rate Last Admin  . omalizumab Arvid Right) injection 300 mg  300 mg Subcutaneous Q14 Days Valentina Shaggy, MD   300 mg at 02/02/20 1008    Allergies  Allergen Reactions  . Cyclobenzaprine Other (See Comments) and Shortness Of Breath    Relaxed tounge Relaxed tounge Relaxed tounge Relaxed tounge   . Shellfish Allergy Shortness Of Breath    Shortness of breath   . Sulfa Antibiotics Anaphylaxis, Hives and Swelling  . Carisoprodol Hives  . Morphine Other (See Comments)    Feels like her head is going to "blow up" Feels like her head is going to "blow up"   . Adhesive  [Tape] Rash    Other reaction(s): SKIN - rash Other reaction(s): SKIN - rash     Family History  Problem Relation Age of Onset  . COPD Mother   . Heart disease Mother   . Alcohol abuse Mother   . Depression Mother   . Drug abuse Mother   . High Cholesterol Mother   . High blood pressure Mother   . COPD Father   . Aneurysm Father   . Heart disease Father   . Early death Father   . Diabetes Father   . Drug abuse Father   . AAA (abdominal aortic aneurysm) Father        cause of death at 18  . Arthritis Sister   . Kidney disease Sister   . Asthma Neg Hx   . Allergic rhinitis Neg  Hx     Social History   Socioeconomic History  . Marital status: Married    Spouse name: Not on file  . Number of children: Not on file  . Years of education: Not on file  . Highest education level: Not on file  Occupational History  . Not on  file  Tobacco Use  . Smoking status: Never Smoker  . Smokeless tobacco: Never Used  Substance and Sexual Activity  . Alcohol use: Yes    Comment: Social/Rare  . Drug use: Not on file  . Sexual activity: Not on file  Other Topics Concern  . Not on file  Social History Narrative  . Not on file   Social Determinants of Health   Financial Resource Strain:   . Difficulty of Paying Living Expenses: Not on file  Food Insecurity:   . Worried About Charity fundraiser in the Last Year: Not on file  . Ran Out of Food in the Last Year: Not on file  Transportation Needs:   . Lack of Transportation (Medical): Not on file  . Lack of Transportation (Non-Medical): Not on file  Physical Activity:   . Days of Exercise per Week: Not on file  . Minutes of Exercise per Session: Not on file  Stress:   . Feeling of Stress : Not on file  Social Connections:   . Frequency of Communication with Friends and Family: Not on file  . Frequency of Social Gatherings with Friends and Family: Not on file  . Attends Religious Services: Not on file  . Active Member of Clubs or Organizations: Not on file  . Attends Archivist Meetings: Not on file  . Marital Status: Not on file  Intimate Partner Violence:   . Fear of Current or Ex-Partner: Not on file  . Emotionally Abused: Not on file  . Physically Abused: Not on file  . Sexually Abused: Not on file    Review of Systems  Constitutional: Negative.   HENT: Positive for hearing loss (chronic).   Eyes: Negative.   Respiratory: Negative.   Cardiovascular: Negative.   Gastrointestinal: Negative.   Musculoskeletal: Negative.   Skin: Negative.   Neurological: Negative.   Endo/Heme/Allergies:  Negative.   Psychiatric/Behavioral: Negative.   All other systems reviewed and are negative.   BP 116/78   Temp 98.3 F (36.8 C) (Temporal)   Wt 203 lb (92.1 kg)   BMI 32.77 kg/m   Physical Exam Vitals and nursing note reviewed.  Constitutional:      General: She is not in acute distress.    Appearance: Normal appearance. She is well-developed. She is obese. She is not ill-appearing.  HENT:     Head: Normocephalic and atraumatic.     Right Ear: Tympanic membrane, ear canal and external ear normal. There is no impacted cerumen.     Left Ear: Tympanic membrane, ear canal and external ear normal. There is no impacted cerumen.     Nose: Nose normal. No congestion or rhinorrhea.     Mouth/Throat:     Mouth: Mucous membranes are moist.     Pharynx: Oropharynx is clear. No oropharyngeal exudate or posterior oropharyngeal erythema.  Eyes:     General:        Right eye: No discharge.        Left eye: No discharge.     Extraocular Movements: Extraocular movements intact.     Conjunctiva/sclera: Conjunctivae normal.     Pupils: Pupils are equal, round, and reactive to light.  Neck:     Thyroid: No thyromegaly.     Vascular: No carotid bruit.     Trachea: No tracheal deviation.  Cardiovascular:     Rate and Rhythm: Normal rate and regular rhythm.     Pulses: Normal pulses.     Heart sounds:  Normal heart sounds. No murmur. No friction rub. No gallop.   Pulmonary:     Effort: Pulmonary effort is normal. No respiratory distress.     Breath sounds: Normal breath sounds. No stridor. No wheezing, rhonchi or rales.  Chest:     Chest wall: No tenderness.  Abdominal:     General: Abdomen is flat. Bowel sounds are normal. There is no distension.     Palpations: Abdomen is soft. There is no mass.     Tenderness: There is no abdominal tenderness. There is no right CVA tenderness, left CVA tenderness, guarding or rebound.     Hernia: No hernia is present.  Musculoskeletal:        General:  No swelling, tenderness, deformity or signs of injury. Normal range of motion.     Cervical back: Normal range of motion and neck supple. No rigidity or tenderness.     Right lower leg: No edema.     Left lower leg: No edema.  Lymphadenopathy:     Cervical: No cervical adenopathy.  Skin:    General: Skin is warm and dry.     Capillary Refill: Capillary refill takes less than 2 seconds.     Coloration: Skin is not jaundiced or pale.     Findings: No bruising, erythema, lesion or rash.  Neurological:     General: No focal deficit present.     Mental Status: She is alert and oriented to person, place, and time.     Cranial Nerves: No cranial nerve deficit.     Sensory: No sensory deficit.     Motor: No weakness.     Coordination: Coordination normal.     Gait: Gait normal.     Deep Tendon Reflexes: Reflexes normal.  Psychiatric:        Mood and Affect: Mood normal.        Behavior: Behavior normal.        Thought Content: Thought content normal.        Judgment: Judgment normal.      Assessment/Plan: 1. Routine general medical examination at a health care facility -She had labs done by allergy and asthma approximately 6 months ago these labs included CBC CMP and vitamin D.  All of her labs were normal except for vitamin D for which she is on supplementation.  Check lipid and thyroid panel - Lipid panel; Future - TSH; Future  2. Need for tetanus booster  - Td vaccine greater than or equal to 7yo preservative free IM  3. Intractable migraine without status migrainosus, unspecified migraine type -Metrics as needed  4. H/O gastric bypass -Encouraged high-protein diet.  Continue lifestyle modifications to lose weight  5. Gastroesophageal reflux disease without esophagitis -Continue with Protonix  6. Severe persistent asthma without complication -Continue with plan of care by allergy and asthma  7. Genital warts -Tinea with Valtrex 500 mg twice daily  8. Yeast  infection -Vies to follow-up with gynecology for further evaluation. - fluconazole (DIFLUCAN) 150 MG tablet; Take 1 tablet (150 mg total) by mouth once for 1 dose.  Dispense: 1 tablet; Refill: 2  9. Dysphagia, unspecified type  - Ambulatory referral to Gastroenterology  Dorothyann Peng, NP

## 2020-02-09 NOTE — Patient Instructions (Signed)
Someone from GI will call you to schedule your appointment   I will follow up with you regarding your blood work   Please let me know if you need anything

## 2020-02-10 ENCOUNTER — Encounter: Payer: Self-pay | Admitting: Adult Health

## 2020-02-10 MED FILL — FLUCONAZOLE 150 MG TABS: 150 | 12 days supply | Qty: 4 | Fill #1

## 2020-02-10 NOTE — Addendum Note (Signed)
Addended by: Apolinar Junes on: 02/10/2020 09:59 AM   Modules accepted: Orders

## 2020-02-15 ENCOUNTER — Ambulatory Visit: Payer: 59 | Admitting: Vascular Surgery

## 2020-02-16 ENCOUNTER — Other Ambulatory Visit: Payer: Self-pay

## 2020-02-16 ENCOUNTER — Ambulatory Visit (INDEPENDENT_AMBULATORY_CARE_PROVIDER_SITE_OTHER): Payer: 59

## 2020-02-16 DIAGNOSIS — J4540 Moderate persistent asthma, uncomplicated: Secondary | ICD-10-CM

## 2020-02-21 ENCOUNTER — Other Ambulatory Visit: Payer: Self-pay | Admitting: Family Medicine

## 2020-02-21 ENCOUNTER — Encounter: Payer: Self-pay | Admitting: Adult Health

## 2020-02-21 DIAGNOSIS — Z0000 Encounter for general adult medical examination without abnormal findings: Secondary | ICD-10-CM

## 2020-02-21 DIAGNOSIS — Z114 Encounter for screening for human immunodeficiency virus [HIV]: Secondary | ICD-10-CM

## 2020-02-22 ENCOUNTER — Other Ambulatory Visit: Payer: Self-pay

## 2020-02-22 ENCOUNTER — Telehealth: Payer: Self-pay

## 2020-02-22 ENCOUNTER — Other Ambulatory Visit: Payer: 59

## 2020-02-22 DIAGNOSIS — Z0000 Encounter for general adult medical examination without abnormal findings: Secondary | ICD-10-CM | POA: Diagnosis not present

## 2020-02-22 DIAGNOSIS — Z114 Encounter for screening for human immunodeficiency virus [HIV]: Secondary | ICD-10-CM | POA: Diagnosis not present

## 2020-02-22 MED ORDER — XOLAIR 150 MG ~~LOC~~ SOLR: SUBCUTANEOUS | 11 refills | Status: DC

## 2020-02-22 NOTE — Telephone Encounter (Signed)
Patient call  that she needed an authorization. She would like an authorization to get this approve. Can we handle this ASAP.

## 2020-02-22 NOTE — Telephone Encounter (Signed)
Thank you you're so awesome at what you do can't thank you more than enough

## 2020-02-22 NOTE — Telephone Encounter (Signed)
Already started PA last week...just waiting to approve Tammy

## 2020-02-23 LAB — HIV ANTIBODY (ROUTINE TESTING W REFLEX): HIV Screen 4th Generation wRfx: NONREACTIVE

## 2020-02-23 LAB — TSH: TSH: 3.67 u[IU]/mL (ref 0.450–4.500)

## 2020-02-23 LAB — LIPID PANEL
Chol/HDL Ratio: 2.2 ratio (ref 0.0–4.4)
Cholesterol, Total: 184 mg/dL (ref 100–199)
HDL: 83 mg/dL (ref >39)
LDL Chol Calc (NIH): 90 mg/dL (ref 0–99)
Triglycerides: 56 mg/dL (ref 0–149)
VLDL Cholesterol Cal: 11 mg/dL (ref 5–40)

## 2020-02-24 ENCOUNTER — Ambulatory Visit: Payer: 59 | Admitting: Vascular Surgery

## 2020-02-24 ENCOUNTER — Telehealth: Payer: Self-pay | Admitting: Pharmacist

## 2020-02-24 NOTE — Telephone Encounter (Signed)
Called patient to follow-up with the [facility]. I was unable to reach the patient and unable to leave a HIPAA-compliant message requesting that the patient return my call due to mailbox not set up.

## 2020-02-27 MED FILL — XOLAIR 150 MG SOLR: 150 | 28 days supply | Qty: 4 | Fill #1

## 2020-02-29 ENCOUNTER — Other Ambulatory Visit: Payer: Self-pay

## 2020-02-29 ENCOUNTER — Encounter: Payer: Self-pay | Admitting: Vascular Surgery

## 2020-02-29 ENCOUNTER — Ambulatory Visit (INDEPENDENT_AMBULATORY_CARE_PROVIDER_SITE_OTHER): Payer: 59 | Admitting: Vascular Surgery

## 2020-02-29 ENCOUNTER — Ambulatory Visit: Payer: 59 | Admitting: Vascular Surgery

## 2020-02-29 VITALS — BP 121/82 | HR 70 | Temp 97.8°F | Resp 18 | Ht 66.75 in | Wt 203.0 lb

## 2020-02-29 DIAGNOSIS — I83813 Varicose veins of bilateral lower extremities with pain: Secondary | ICD-10-CM

## 2020-02-29 NOTE — Progress Notes (Signed)
Patient is a 49 year old female who returns for follow-up today.  She was last seen by one of our PAs [facility] November 02, 2019.  This was for symptomatic varicose veins.  The patient has had previous laser ablation of her left and right greater saphenous veins in [place] several years ago.  She has had now recurrent symptoms of heaviness fullness aching swelling in both lower extremities as the day progresses.  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  She has been compliant wearing thigh-high compression stockings.  She has one area on the right lateral aspect of her thigh which is particularly bothersome to her from a pain standpoint.  She also has noticed a new area of spider type varicosities on her left knee.  Both legs are fairly equal as far as pain aching and swelling.  She has no prior history of DVT.  There is had significant weight loss since her prior vein procedures.  She did have a gastric bypass in 2016.  Review of systems: She has no shortness of breath.  She has no chest pain.  Current Outpatient Medications on File Prior to Visit  Medication Sig Dispense Refill  . acetaminophen (TYLENOL) 500 MG tablet Take by mouth.    Marland Kitchen albuterol (VENTOLIN HFA) 108 (90 Base) MCG/ACT inhaler Inhale into the lungs.    Marland Kitchen albuterol (VENTOLIN HFA) 108 (90 Base) MCG/ACT inhaler Inhale 2 puffs into the lungs every 6 (six) hours as needed for wheezing or shortness of breath. 54 g 1  . Azelastine-Fluticasone 137-50 MCG/ACT SUSP Place 1 spray into both nostrils 2 (two) times daily as needed. 23 g 5  . CALCIUM CITRATE PO Take 2 tablets by mouth daily.    . cetirizine (ZYRTEC) 10 MG tablet Take by mouth.    . Cholecalciferol (VITAMIN D3 PO) Take by mouth.    . Cyanocobalamin 500 MCG/0.1ML SOLN Place into the nose.    . diazepam (VALIUM) 10 MG tablet Take by mouth.    . EPINEPHrine (AUVI-Q) 0.3 mg/0.3 mL IJ SOAJ injection Inject 0.3 mLs (0.3 mg total) into the muscle as needed for  anaphylaxis. 2 each 2  . ferrous fumarate-iron polysaccharide complex (TANDEM) 162-115.2 MG CAPS capsule Take by mouth.    . Fluticasone Furoate 200 MCG/ACT AEPB Inhale into the lungs.    . fluticasone-salmeterol (ADVAIR HFA) 230-21 MCG/ACT inhaler Inhale into the lungs.    . hydrocortisone 2.5 % ointment     . Hypertonic Nasal Wash (SINUS RINSE REFILL) PACK Use one packet dissolved in water as needed. 100 each 5  . ibuprofen (ADVIL,MOTRIN) 800 MG tablet Take by mouth.    Marland Kitchen ipratropium (ATROVENT) 0.06 % nasal spray Place 2 sprays into both nostrils 4 (four) times daily. 15 mL 12  . meclizine (ANTIVERT) 25 MG tablet     . MUCUS RELIEF ER 600 MG 12 hr tablet TK 2 TS PO BID    . Multiple Vitamin (MULTI-VITAMIN DAILY PO) Take by mouth.    . Olopatadine HCl 0.2 % SOLN Apply to eye.    Marland Kitchen omalizumab (XOLAIR) 150 MG injection INJECT 300 MG INTO THE SKIN EVERY 14 DAYS 4 each 11  . ondansetron (ZOFRAN-ODT) 4 MG disintegrating tablet Place under the tongue.    . pantoprazole (PROTONIX) 40 MG tablet     . SUMAtriptan (IMITREX) 100 MG tablet     . triamcinolone ointment (KENALOG) 0.1 % Apply topically.    Marland Kitchen UNABLE TO FIND Massage  as directed.   Neck, back, shoulder pain.   Flex-spending account    . UNABLE TO FIND Below the knee compression stockings, sig: use daily as needed for lower extremity edema    . valACYclovir (VALTREX) 500 MG tablet      Current Facility-Administered Medications on File Prior to Visit  Medication Dose Route Frequency Provider Last Rate Last Admin  . omalizumab Arvid Right) injection 300 mg  300 mg Subcutaneous Q14 Days Valentina Shaggy, MD   300 mg at 02/16/20 1035    Past Medical History:  Diagnosis Date  . Asthma   . Chicken pox   . Genital warts   . GERD (gastroesophageal reflux disease)   . H/O gastric bypass 2016  . Migraines    Past Surgical History:  Procedure Laterality Date  . ANKLE FRACTURE SURGERY  03/08/2009  . BREAST SURGERY Bilateral    Cyst  Removal  . GASTRIC BYPASS  06/18/2015  . TONSILLECTOMY AND ADENOIDECTOMY  2001  . TUBAL LIGATION  12/19/1999  . Uterine ablation  12/2018    Physical exam:  Vitals:   02/29/20 1116  BP: 121/82  Pulse: 70  Resp: 18  Temp: 97.8 F (36.6 C)  TempSrc: Temporal  SpO2: 100%  Weight: 203 lb (92.1 kg)  Height: 5' 6.75" (1.695 m)    Extremities: Sterile spider type varicosities adjacent to the left patella encompassing an area of about 5 cm.  No obvious palpable varicosities in the left leg.  In the right leg there is a cluster of varicosities in the right lateral thigh encompassing an area of about 7 cm.  There are multiple large varicosities in this location that the patient states become tender when they become more inflamed.  There are probably 10-20 of these. See image below.    Data: Patient had a venous duplex exam previously which I reviewed and interpreted.  This showed deep vein reflux in the common femoral vein on the right side.  There was successful ablation of the right greater saphenous vein.  The left greater saphenous vein was patent but had areas of scar from prior ablation.  It was about 3 mm in diameter.  I repeated portions of her ultrasound today with the SonoSite at the bedside.  This again shows good closure of the right greater saphenous vein.  There is an accessory right saphenous which courses across the anterior aspect of the right thigh.  But again this is only about 3 mm in diameter.  The left greater saphenous vein is 3 mm or less throughout its course.  Assessment: Symptomatic varicose veins with pain aching swelling bilateral lower extremities.  As far as the left leg is concerned I spoke with the patient regarding the spider type varicosities adjacent to her left knee.  I discussed with her that she could have sclerotherapy of this but this would not be covered by her insurance.  Additionally on her right leg she does have a cluster of palpable  varicosities on the lateral aspect of her right thigh which are troublesome to her with pain and inflammation at times.  Plan: Patient will call us if she wishes to have sclerotherapy of the spider veins over her left knee.  We will try to get her approved by her insurance for stab avulsions 10-20 right lateral aspect of her thigh.  She will continue to wear her compression stockings.  Ruta Hinds, MD [facility] Office: [phone]

## 2020-03-01 ENCOUNTER — Ambulatory Visit (INDEPENDENT_AMBULATORY_CARE_PROVIDER_SITE_OTHER): Payer: 59

## 2020-03-01 DIAGNOSIS — J4540 Moderate persistent asthma, uncomplicated: Secondary | ICD-10-CM | POA: Diagnosis not present

## 2020-03-02 ENCOUNTER — Encounter: Payer: Self-pay | Admitting: Adult Health

## 2020-03-06 ENCOUNTER — Telehealth: Payer: Self-pay | Admitting: Pharmacist

## 2020-03-06 NOTE — Telephone Encounter (Signed)
Called patient [facility]. I was unable to reach the patient and unable to [place] message requesting that the patient return my calldue to mailbox not set up.

## 2020-03-15 ENCOUNTER — Ambulatory Visit (INDEPENDENT_AMBULATORY_CARE_PROVIDER_SITE_OTHER): Payer: 59

## 2020-03-15 DIAGNOSIS — J4540 Moderate persistent asthma, uncomplicated: Secondary | ICD-10-CM | POA: Diagnosis not present

## 2020-03-16 ENCOUNTER — Ambulatory Visit: Payer: 59

## 2020-03-21 DIAGNOSIS — Z1231 Encounter for screening mammogram for malignant neoplasm of breast: Secondary | ICD-10-CM | POA: Diagnosis not present

## 2020-03-26 MED FILL — IBUPROFEN 800 MG TABS: 800 | 30 days supply | Qty: 90 | Fill #0

## 2020-03-26 MED FILL — FLUCONAZOLE 150 MG TABLET: 150 | 12 days supply | Qty: 4 | Fill #0

## 2020-03-27 ENCOUNTER — Telehealth: Payer: Self-pay | Admitting: *Deleted

## 2020-03-27 MED FILL — XOLAIR 150 MG SOLR: 150 | 28 days supply | Qty: 4 | Fill #2

## 2020-03-27 NOTE — Telephone Encounter (Signed)
Called [facility] to report results of peer to peer review between Dr. Oneida Alar and [facility] medical director Maryjean Morn MD.  Dr. Manus Rudd upheld the denial on ref# [id number] for CPT [id number] right leg.  Dr. Manus Rudd recommended that we send in a new prior authorization request for CPT code [phone] with photos and measurements of right leg tributaries/varicose veins.  Asked Ms. Stitely to call me back to schedule a follow up visit with Dr. Oneida Alar at her earliest convenience as she needs to consult her work schedule.

## 2020-03-29 ENCOUNTER — Ambulatory Visit (INDEPENDENT_AMBULATORY_CARE_PROVIDER_SITE_OTHER): Payer: 59

## 2020-03-29 ENCOUNTER — Ambulatory Visit: Payer: 59

## 2020-03-29 DIAGNOSIS — J4540 Moderate persistent asthma, uncomplicated: Secondary | ICD-10-CM

## 2020-04-11 ENCOUNTER — Encounter: Payer: Self-pay | Admitting: Adult Health

## 2020-04-12 ENCOUNTER — Other Ambulatory Visit: Payer: Self-pay

## 2020-04-12 ENCOUNTER — Ambulatory Visit (INDEPENDENT_AMBULATORY_CARE_PROVIDER_SITE_OTHER): Payer: 59

## 2020-04-12 ENCOUNTER — Other Ambulatory Visit: Payer: Self-pay | Admitting: Adult Health

## 2020-04-12 DIAGNOSIS — J4540 Moderate persistent asthma, uncomplicated: Secondary | ICD-10-CM

## 2020-04-12 MED ORDER — IMIQUIMOD 5 % EX CREA: TOPICAL_CREAM | CUTANEOUS | 0 refills | Status: DC

## 2020-04-12 MED FILL — IMIQUIMOD 5 % CREA: 5 | 7 days supply | Qty: 5 | Fill #0

## 2020-04-17 ENCOUNTER — Telehealth: Payer: Self-pay | Admitting: Allergy & Immunology

## 2020-04-17 ENCOUNTER — Other Ambulatory Visit (HOSPITAL_COMMUNITY): Payer: Self-pay | Admitting: Allergy & Immunology

## 2020-04-17 MED ORDER — TRELEGY ELLIPTA 200-62.5-25 MCG/INH IN AEPB: 1.0000 | INHALATION_SPRAY | Freq: Every day | RESPIRATORY_TRACT | 5 refills | Status: AC

## 2020-04-17 NOTE — Telephone Encounter (Signed)
Patient called requesting a trial of Trelegy for worsening asthma control. She is currently on Breo 200 (and has Advair as well). Prescription sent in.  Salvatore Marvel, MD [facility] [place] [facility]

## 2020-04-25 MED FILL — FLUCONAZOLE 150 MG TABLET: 150 | 12 days supply | Qty: 4 | Fill #2

## 2020-04-26 ENCOUNTER — Ambulatory Visit (INDEPENDENT_AMBULATORY_CARE_PROVIDER_SITE_OTHER): Payer: 59

## 2020-04-26 ENCOUNTER — Other Ambulatory Visit: Payer: Self-pay

## 2020-04-26 DIAGNOSIS — J4540 Moderate persistent asthma, uncomplicated: Secondary | ICD-10-CM | POA: Diagnosis not present

## 2020-05-03 ENCOUNTER — Encounter (HOSPITAL_COMMUNITY): Payer: Self-pay

## 2020-05-03 NOTE — Patient Instructions (Addendum)
DUE TO COVID-19 ONLY ONE VISITOR IS ALLOWED IN WAITING ROOM (VISITOR WILL HAVE A TEMPERATURE CHECK ON ARRIVAL AND MUST WEAR A FACE MASK THE ENTIRE TIME.)  ONCE YOU ARE ADMITTED TO YOUR PRIVATE ROOM, THE SAME ONE VISITOR IS ALLOWED TO VISIT DURING VISITING HOURS ONLY.  Your COVID swab testing is scheduled for Friday, May 11, 2020  at 2:35 PM , You must self quarantine after your testing per handout given to you at the testing site.  ([place] up testing enter pre-surgical testing line)    Your procedure is scheduled on:  Tuesday, May 15, 2020  Report to [facility] AT 5:30  A. M.   Call this number if you have problems the morning of surgery:  [phone].   OUR ADDRESS IS [address].  WE ARE LOCATED IN THE [place]                                   [facility].                                     REMEMBER:  DO NOT EAT FOOD OR DRINK LIQUIDS AFTER MIDNIGHT .    BRUSH YOUR TEETH THE MORNING OF SURGERY.  TAKE THESE MEDICATIONS MORNING OF SURGERY WITH A SIP OF WATER: Zyrtec   USE INHALER AND EYEDROPS THE MORNING OF SURGERY  MAY USE NASAL SPRAY MORNING OF SURGERY  BRING INHALERS DAY OF SURGERY  DO NOT WEAR JEWERLY, MAKE UP, OR NAIL POLISH.  DO NOT WEAR LOTIONS, POWDERS, PERFUMES/COLOGNE OR DEODORANT.  DO NOT SHAVE FOR 24 HOURS PRIOR TO DAY OF SURGERY.  CONTACTS, GLASSES, OR DENTURES MAY NOT BE WORN TO SURGERY.                                    [facility] IS NOT RESPONSIBLE  FOR ANY BELONGINGS.          BRING ALL PRESCRIPTION MEDICATIONS WITH YOU THE DAY OF SURGERY IN ORIGINAL CONTAINERS                                                               [facility] - Preparing for Surgery Before surgery, you can play an important role.  Because skin is not sterile, your skin needs to be as free of germs as possible.  You can reduce the number of germs on your skin by washing with CHG (chlorahexidine  gluconate) soap before surgery.  CHG is an antiseptic cleaner which kills germs and bonds with the skin to continue killing germs even after washing. Please DO NOT use if you have an allergy to CHG or antibacterial soaps.  If your skin becomes reddened/irritated stop using the CHG and inform your nurse when you arrive at Short Stay. Do not shave (including legs and underarms) for at least 48 hours prior to the first CHG shower.  You may shave your face/neck.  Please follow these instructions carefully:  1.  Shower with CHG Soap  the night before surgery and the  morning of surgery.  2.  If you choose to wash your hair, wash your hair first as usual with your normal  shampoo.  3.  After you shampoo, rinse your hair and body thoroughly to remove the shampoo.                             4.  Use CHG as you would any other liquid soap.  You can apply chg directly to the skin and wash.  Gently with a scrungie or clean washcloth.  5.  Apply the CHG Soap to your body ONLY FROM THE NECK DOWN.   Do   not use on face/ open                           Wound or open sores. Avoid contact with eyes, ears mouth and   genitals (private parts).                       Wash face,  Genitals (private parts) with your normal soap.             6.  Wash thoroughly, paying special attention to the area where your    surgery  will be performed.  7.  Thoroughly rinse your body with warm water from the neck down.  8.  DO NOT shower/wash with your normal soap after using and rinsing off the CHG Soap.                9.  Pat yourself dry with a clean towel.            10.  Wear clean pajamas.            11.  Place clean sheets on your bed the night of your first shower and do not  sleep with pets. Day of Surgery : Do not apply any lotions/deodorants the morning of surgery.  Please wear clean clothes to the hospital/surgery center.  FAILURE TO FOLLOW THESE INSTRUCTIONS MAY RESULT IN THE CANCELLATION OF YOUR SURGERY  PATIENT  SIGNATURE_________________________________  NURSE SIGNATURE__________________________________  ________________________________________________________________________  WHAT IS A BLOOD TRANSFUSION? Blood Transfusion Information  A transfusion is the replacement of blood or some of its parts. Blood is made up of multiple cells which provide different functions.  Red blood cells carry oxygen and are used for blood loss replacement.  White blood cells fight against infection.  Platelets control bleeding.  Plasma helps clot blood.  Other blood products are available for specialized needs, such as hemophilia or other clotting disorders. BEFORE THE TRANSFUSION  Who gives blood for transfusions?   Healthy volunteers who are fully evaluated to make sure their blood is safe. This is blood bank blood. Transfusion therapy is the safest it has ever been in the practice of medicine. Before blood is taken from a donor, a complete history is taken to make sure that person has no history of diseases nor engages in risky social behavior (examples are intravenous drug use or sexual activity with multiple partners). The donor's travel history is screened to minimize risk of transmitting infections, such as malaria. The donated blood is tested for signs of infectious diseases, such as HIV and hepatitis. The blood is then tested to be sure it is compatible with you in order to minimize the chance of a transfusion reaction. If you  or a relative donates blood, this is often done in anticipation of surgery and is not appropriate for emergency situations. It takes many days to process the donated blood. RISKS AND COMPLICATIONS Although transfusion therapy is very safe and saves many lives, the main dangers of transfusion include:   Getting an infectious disease.  Developing a transfusion reaction. This is an allergic reaction to something in the blood you were given. Every precaution is taken to prevent  this. The decision to have a blood transfusion has been considered carefully by your caregiver before blood is given. Blood is not given unless the benefits outweigh the risks. AFTER THE TRANSFUSION  Right after receiving a blood transfusion, you will usually feel much better and more energetic. This is especially true if your red blood cells have gotten low (anemic). The transfusion raises the level of the red blood cells which carry oxygen, and this usually causes an energy increase.  The nurse administering the transfusion will monitor you carefully for complications. HOME CARE INSTRUCTIONS  No special instructions are needed after a transfusion. You may find your energy is better. Speak with your caregiver about any limitations on activity for underlying diseases you may have. SEEK MEDICAL CARE IF:   Your condition is not improving after your transfusion.  You develop redness or irritation at the intravenous (IV) site. SEEK IMMEDIATE MEDICAL CARE IF:  Any of the following symptoms occur over the next 12 hours:  Shaking chills.  You have a temperature by mouth above 102 F (38.9 C), not controlled by medicine.  Chest, back, or muscle pain.  People around you feel you are not acting correctly or are confused.  Shortness of breath or difficulty breathing.  Dizziness and fainting.  You get a rash or develop hives.  You have a decrease in urine output.  Your urine turns a dark color or changes to pink, red, or brown. Any of the following symptoms occur over the next 10 days:  You have a temperature by mouth above 102 F (38.9 C), not controlled by medicine.  Shortness of breath.  Weakness after normal activity.  The white part of the eye turns yellow (jaundice).  You have a decrease in the amount of urine or are urinating less often.  Your urine turns a dark color or changes to pink, red, or brown. Document Released: 11/21/2000 Document Revised: 02/16/2012 Document  Reviewed: 07/10/2008 [facility] Patient Information 2014 [place], [place].  _______________________________________________________________________

## 2020-05-04 ENCOUNTER — Other Ambulatory Visit (HOSPITAL_COMMUNITY): Payer: 59

## 2020-05-04 ENCOUNTER — Encounter (HOSPITAL_COMMUNITY)
Admission: RE | Admit: 2020-05-04 | Discharge: 2020-05-04 | Disposition: A | Discharge status: Home / Self Care | Payer: 59 | Source: Ambulatory Visit | Attending: Obstetrics and Gynecology | Admitting: Obstetrics and Gynecology

## 2020-05-04 ENCOUNTER — Other Ambulatory Visit: Payer: Self-pay

## 2020-05-04 ENCOUNTER — Encounter (HOSPITAL_COMMUNITY): Payer: Self-pay

## 2020-05-04 DIAGNOSIS — Z01812 Encounter for preprocedural laboratory examination: Secondary | ICD-10-CM | POA: Insufficient documentation

## 2020-05-04 HISTORY — DX: Obesity, unspecified: E66.9

## 2020-05-04 HISTORY — DX: Other specified postprocedural states: Z98.890

## 2020-05-04 HISTORY — DX: Asymptomatic varicose veins of unspecified lower extremity: I83.90

## 2020-05-04 HISTORY — DX: Other complications of anesthesia, initial encounter: T88.59XA

## 2020-05-04 HISTORY — DX: Unspecified osteoarthritis, unspecified site: M19.90

## 2020-05-04 HISTORY — DX: Nausea with vomiting, unspecified: R11.2

## 2020-05-04 HISTORY — DX: Herpesviral infection, unspecified: B00.9

## 2020-05-04 HISTORY — DX: Pneumonia, unspecified organism: J18.9

## 2020-05-04 HISTORY — DX: Anxiety disorder, unspecified: F41.9

## 2020-05-04 HISTORY — DX: Personal history of diseases of the blood and blood-forming organs and certain disorders involving the immune mechanism: Z86.2

## 2020-05-04 LAB — BASIC METABOLIC PANEL
Anion gap: 5 (ref 5–15)
BUN: 17 mg/dL (ref 6–20)
CO2: 26 mmol/L (ref 22–32)
Calcium: 8.4 mg/dL — ABNORMAL LOW (ref 8.9–10.3)
Chloride: 110 mmol/L (ref 98–111)
Creatinine, Ser: 0.79 mg/dL (ref 0.44–1.00)
GFR calc Af Amer: >60 mL/min (ref >60)
GFR calc non Af Amer: >60 mL/min (ref >60)
Glucose, Bld: 88 mg/dL (ref 70–99)
Potassium: 4 mmol/L (ref 3.5–5.1)
Sodium: 141 mmol/L (ref 135–145)

## 2020-05-04 LAB — CBC
HCT: 37.7 % (ref 36.0–46.0)
Hemoglobin: 12.2 g/dL (ref 12.0–15.0)
MCH: 31.5 pg (ref 26.0–34.0)
MCHC: 32.4 g/dL (ref 30.0–36.0)
MCV: 97.4 fL (ref 80.0–100.0)
Platelets: 209 10*3/uL (ref 150–400)
RBC: 3.87 MIL/uL (ref 3.87–5.11)
RDW: 13.2 % (ref 11.5–15.5)
WBC: 6.3 10*3/uL (ref 4.0–10.5)
nRBC: 0 % (ref 0.0–0.2)

## 2020-05-04 LAB — TYPE AND SCREEN
ABO/RH(D): A POS
Antibody Screen: NEGATIVE

## 2020-05-04 LAB — ABO/RH: ABO/RH(D): A POS

## 2020-05-04 NOTE — Progress Notes (Signed)
COVID Vaccine Completed: Yes Date COVID Vaccine completed: 12/23/2019 COVID vaccine manufacturer: [employer]      PCP - C. Nafziger last office visit 02/09/20 Cardiologist - N/A  Chest x-ray - N/A EKG - N/A Stress Test - greater than  2years ECHO - N/A Cardiac Cath - N/A  Sleep Study - N/A CPAP - N/A  Fasting Blood Sugar - N/A Checks Blood Sugar __N/A___ times a day  Blood Thinner Instructions:  N/A Aspirin Instructions: N/A Last Dose: N/A  Anesthesia review:  N/A  Patient denies shortness of breath, fever, cough and chest pain at PAT appointment   Patient verbalized understanding of instructions that were given to them at the PAT appointment. Patient was also instructed that they will need to review over the PAT instructions again at home before surgery.

## 2020-05-09 DIAGNOSIS — D259 Leiomyoma of uterus, unspecified: Secondary | ICD-10-CM | POA: Diagnosis not present

## 2020-05-09 DIAGNOSIS — Z113 Encounter for screening for infections with a predominantly sexual mode of transmission: Secondary | ICD-10-CM | POA: Diagnosis not present

## 2020-05-09 DIAGNOSIS — R350 Frequency of micturition: Secondary | ICD-10-CM | POA: Diagnosis not present

## 2020-05-09 DIAGNOSIS — R102 Pelvic and perineal pain: Secondary | ICD-10-CM | POA: Diagnosis not present

## 2020-05-09 DIAGNOSIS — N898 Other specified noninflammatory disorders of vagina: Secondary | ICD-10-CM | POA: Diagnosis not present

## 2020-05-09 DIAGNOSIS — G8918 Other acute postprocedural pain: Secondary | ICD-10-CM | POA: Diagnosis not present

## 2020-05-09 DIAGNOSIS — N939 Abnormal uterine and vaginal bleeding, unspecified: Secondary | ICD-10-CM | POA: Diagnosis not present

## 2020-05-09 MED FILL — HYDROCODON-APAP 5-325: 5-325 | 4 days supply | Qty: 30 | Fill #0

## 2020-05-09 MED FILL — FLUCONAZOLE 150 MG TABLET: 150 | 8 days supply | Qty: 4 | Fill #0

## 2020-05-10 ENCOUNTER — Ambulatory Visit (INDEPENDENT_AMBULATORY_CARE_PROVIDER_SITE_OTHER): Payer: 59

## 2020-05-10 DIAGNOSIS — J4540 Moderate persistent asthma, uncomplicated: Secondary | ICD-10-CM | POA: Diagnosis not present

## 2020-05-10 MED FILL — metroNIDAZOLE 0.75 % GEL: 0.75 | 5 days supply | Qty: 70 | Fill #0

## 2020-05-11 ENCOUNTER — Ambulatory Visit: Payer: 59

## 2020-05-11 ENCOUNTER — Other Ambulatory Visit (HOSPITAL_COMMUNITY)
Admission: RE | Admit: 2020-05-11 | Discharge: 2020-05-11 | Disposition: A | Discharge status: Home / Self Care | Payer: 59 | Source: Ambulatory Visit | Attending: Pediatrics | Admitting: Pediatrics

## 2020-05-11 DIAGNOSIS — Z01812 Encounter for preprocedural laboratory examination: Secondary | ICD-10-CM | POA: Insufficient documentation

## 2020-05-11 DIAGNOSIS — Z20822 Contact with and (suspected) exposure to covid-19: Secondary | ICD-10-CM | POA: Diagnosis not present

## 2020-05-12 LAB — SARS CORONAVIRUS 2 (TAT 6-24 HRS): SARS Coronavirus 2: NEGATIVE

## 2020-05-14 NOTE — H&P (Addendum)
Donna Park is an 49 y.o. female. presenting for surgical treatment of pelvic pain, aub, fibroids.  She has a long past hx of fibroids and tx included endometrial ablation which was not successful.  She continues with symptoms and presents for LAVH/BS  No LMP recorded. Patient has had an ablation.    Past Medical History:  Diagnosis Date  . Anxiety   . Asthma   . Chicken pox   . Complication of anesthesia    slow to wake up from anesthesia  . DJD (degenerative joint disease)   . Genital warts   . GERD (gastroesophageal reflux disease)    resolved after gastric surgery  . H/O gastric bypass 2016  . History of iron deficiency anemia   . HSV infection   . Migraines   . Obese   . Pneumonia    Childhood history  . PONV (postoperative nausea and vomiting)   . Varicose vein of leg     Past Surgical History:  Procedure Laterality Date  . ANKLE FRACTURE SURGERY Left 03/08/2009  . BREAST SURGERY Bilateral    Cyst Removal  . GASTRIC BYPASS  06/18/2015  . LASER ABLATION     Varicose veins  . TONSILLECTOMY AND ADENOIDECTOMY  2001  . TUBAL LIGATION  12/19/1999  . UPPER GI ENDOSCOPY    . Uterine ablation  12/2018    Family History  Problem Relation Age of Onset  . COPD Mother   . Heart disease Mother   . Alcohol abuse Mother   . Depression Mother   . Drug abuse Mother   . High Cholesterol Mother   . High blood pressure Mother   . COPD Father   . Aneurysm Father   . Heart disease Father   . Early death Father   . Diabetes Father   . Drug abuse Father   . AAA (abdominal aortic aneurysm) Father        cause of death at 27  . Arthritis Sister   . Kidney disease Sister   . Asthma Neg Hx   . Allergic rhinitis Neg Hx     Social History:  reports that she has never smoked. She has never used smokeless tobacco. She reports current alcohol use. She reports that she does not use drugs.  Allergies:  Allergies  Allergen Reactions  . Cyclobenzaprine Shortness Of Breath  and Other (See Comments)    Relaxed tongue   . Shellfish Allergy Shortness Of Breath    Shortness of breath ([place]RIMP ONLY)  . Sulfa Antibiotics Anaphylaxis, Hives and Swelling  . Carisoprodol Hives  . Morphine Other (See Comments)    Feels like her head is going to "blow up"    . Adhesive [Tape] Rash    Other reaction(s): SKIN - rash (skin burns) PAPER TAPE IS OK     No medications prior to admission.    Review of Systems  There were no vitals taken for this visit. Physical Exam RRR Lungs CTAB Abd ND NT Uterus AV 8 weeks size irregular No results found for this or any previous visit (from the past 24 hour(s)).  No results found.  Assessment/Plan: Pelvic pain, aub, fibroids which are symptomatic.  She presents for definitive surgical treatment and plan LAVH/BS We discussed the procedure at length.  The risks and benefits and alternative treatments were discussed.  Risks include, but not limited to, injury to bowel, bladder, uterus, tubes ovaries, risks associated with possible laparotomy, blood transfusion, and infection.  All of her  questions were answered and she gives informed consent.  Luz Lex 05/14/2020, 11:07 PM   This patient has been seen and examined.   All of her questions were answered.  Labs and vital signs reviewed.  Informed consent has been obtained.  The History and Physical is current. DL 05/15/20 0715

## 2020-05-15 ENCOUNTER — Observation Stay (HOSPITAL_BASED_OUTPATIENT_CLINIC_OR_DEPARTMENT_OTHER): Payer: 59 | Admitting: Anesthesiology

## 2020-05-15 ENCOUNTER — Observation Stay (HOSPITAL_BASED_OUTPATIENT_CLINIC_OR_DEPARTMENT_OTHER)
Admission: RE | Admit: 2020-05-15 | Discharge: 2020-05-16 | Disposition: A | Discharge status: Home / Self Care | Payer: 59 | Attending: Obstetrics and Gynecology | Admitting: Obstetrics and Gynecology

## 2020-05-15 ENCOUNTER — Encounter (HOSPITAL_BASED_OUTPATIENT_CLINIC_OR_DEPARTMENT_OTHER): Payer: Self-pay | Admitting: Obstetrics and Gynecology

## 2020-05-15 ENCOUNTER — Encounter (HOSPITAL_BASED_OUTPATIENT_CLINIC_OR_DEPARTMENT_OTHER)
Admission: RE | Disposition: A | Discharge status: Home / Self Care | Payer: Self-pay | Source: Home / Self Care | Attending: Obstetrics and Gynecology

## 2020-05-15 DIAGNOSIS — R102 Pelvic and perineal pain: Secondary | ICD-10-CM | POA: Insufficient documentation

## 2020-05-15 DIAGNOSIS — N72 Inflammatory disease of cervix uteri: Secondary | ICD-10-CM | POA: Diagnosis not present

## 2020-05-15 DIAGNOSIS — G43909 Migraine, unspecified, not intractable, without status migrainosus: Secondary | ICD-10-CM | POA: Insufficient documentation

## 2020-05-15 DIAGNOSIS — J45909 Unspecified asthma, uncomplicated: Secondary | ICD-10-CM | POA: Diagnosis not present

## 2020-05-15 DIAGNOSIS — N939 Abnormal uterine and vaginal bleeding, unspecified: Secondary | ICD-10-CM | POA: Diagnosis not present

## 2020-05-15 DIAGNOSIS — Z9884 Bariatric surgery status: Secondary | ICD-10-CM | POA: Diagnosis not present

## 2020-05-15 DIAGNOSIS — Z90710 Acquired absence of both cervix and uterus: Secondary | ICD-10-CM | POA: Diagnosis present

## 2020-05-15 DIAGNOSIS — E669 Obesity, unspecified: Secondary | ICD-10-CM | POA: Insufficient documentation

## 2020-05-15 DIAGNOSIS — D259 Leiomyoma of uterus, unspecified: Secondary | ICD-10-CM | POA: Diagnosis not present

## 2020-05-15 DIAGNOSIS — K219 Gastro-esophageal reflux disease without esophagitis: Secondary | ICD-10-CM | POA: Diagnosis not present

## 2020-05-15 DIAGNOSIS — M1990 Unspecified osteoarthritis, unspecified site: Secondary | ICD-10-CM | POA: Insufficient documentation

## 2020-05-15 DIAGNOSIS — Z8249 Family history of ischemic heart disease and other diseases of the circulatory system: Secondary | ICD-10-CM | POA: Insufficient documentation

## 2020-05-15 DIAGNOSIS — J4540 Moderate persistent asthma, uncomplicated: Secondary | ICD-10-CM | POA: Diagnosis not present

## 2020-05-15 DIAGNOSIS — N838 Other noninflammatory disorders of ovary, fallopian tube and broad ligament: Secondary | ICD-10-CM | POA: Insufficient documentation

## 2020-05-15 DIAGNOSIS — N879 Dysplasia of cervix uteri, unspecified: Secondary | ICD-10-CM | POA: Diagnosis not present

## 2020-05-15 DIAGNOSIS — D251 Intramural leiomyoma of uterus: Secondary | ICD-10-CM | POA: Diagnosis not present

## 2020-05-15 HISTORY — PX: LAPAROSCOPIC VAGINAL HYSTERECTOMY WITH SALPINGECTOMY: SHX6680

## 2020-05-15 LAB — POCT PREGNANCY, URINE: Preg Test, Ur: NEGATIVE

## 2020-05-15 SURGERY — HYSTERECTOMY, VAGINAL, LAPAROSCOPY-ASSISTED, WITH SALPINGECTOMY
Anesthesia: General | Site: Abdomen | Laterality: Bilateral

## 2020-05-15 MED ORDER — ONDANSETRON HCL 4 MG/2ML IJ SOLN
INTRAMUSCULAR | Status: AC
  Filled 2020-05-15: qty 2

## 2020-05-15 MED ORDER — DEXTROSE-NACL 5-0.45 % IV SOLN: INTRAVENOUS | Status: DC

## 2020-05-15 MED ORDER — SODIUM CHLORIDE (PF) 0.9 % IJ SOLN
INTRAMUSCULAR | Status: AC
  Filled 2020-05-15: qty 10

## 2020-05-15 MED ORDER — DEXAMETHASONE SODIUM PHOSPHATE 4 MG/ML IJ SOLN
INTRAMUSCULAR | Status: DC | PRN
  Administered 2020-05-15: 8 mg via INTRAVENOUS

## 2020-05-15 MED ORDER — DEXAMETHASONE SODIUM PHOSPHATE 10 MG/ML IJ SOLN
INTRAMUSCULAR | Status: AC
  Filled 2020-05-15: qty 1

## 2020-05-15 MED ORDER — PROMETHAZINE HCL 25 MG/ML IJ SOLN
INTRAMUSCULAR | Status: AC
  Filled 2020-05-15: qty 1

## 2020-05-15 MED ORDER — ACETAMINOPHEN 500 MG PO TABS
ORAL_TABLET | ORAL | Status: AC
  Filled 2020-05-15: qty 2

## 2020-05-15 MED ORDER — PROPOFOL 10 MG/ML IV BOLUS
INTRAVENOUS | Status: DC | PRN
  Administered 2020-05-15: 150 mg via INTRAVENOUS

## 2020-05-15 MED ORDER — PROPOFOL 10 MG/ML IV BOLUS
INTRAVENOUS | Status: AC
  Filled 2020-05-15: qty 20

## 2020-05-15 MED ORDER — GLYCOPYRROLATE PF 0.2 MG/ML IJ SOSY
PREFILLED_SYRINGE | INTRAMUSCULAR | Status: AC
  Filled 2020-05-15: qty 1

## 2020-05-15 MED ORDER — IBUPROFEN 200 MG PO TABS
600.0000 mg | ORAL_TABLET | Freq: Four times a day (QID) | ORAL | Status: DC | PRN
  Administered 2020-05-15 – 2020-05-16 (×3): 600 mg via ORAL

## 2020-05-15 MED ORDER — HYDROCODONE-ACETAMINOPHEN 5-325 MG PO TABS
ORAL_TABLET | ORAL | Status: AC
  Filled 2020-05-15: qty 1

## 2020-05-15 MED ORDER — KETOROLAC TROMETHAMINE 30 MG/ML IJ SOLN
INTRAMUSCULAR | Status: AC
  Filled 2020-05-15: qty 1

## 2020-05-15 MED ORDER — ACETAMINOPHEN 500 MG PO TABS
1000.0000 mg | ORAL_TABLET | Freq: Four times a day (QID) | ORAL | Status: DC
  Administered 2020-05-15: 1000 mg via ORAL

## 2020-05-15 MED ORDER — FENTANYL CITRATE (PF) 100 MCG/2ML IJ SOLN
INTRAMUSCULAR | Status: DC | PRN
  Administered 2020-05-15 (×3): 50 ug via INTRAVENOUS

## 2020-05-15 MED ORDER — LIDOCAINE HCL (CARDIAC) PF 100 MG/5ML IV SOSY
PREFILLED_SYRINGE | INTRAVENOUS | Status: DC | PRN
  Administered 2020-05-15: 100 mg via INTRAVENOUS

## 2020-05-15 MED ORDER — SODIUM CHLORIDE 0.9 % IV SOLN
2.0000 g | INTRAVENOUS | Status: AC
  Administered 2020-05-15: 2 g via INTRAVENOUS

## 2020-05-15 MED ORDER — ROCURONIUM BROMIDE 100 MG/10ML IV SOLN
INTRAVENOUS | Status: DC | PRN
  Administered 2020-05-15: 10 mg via INTRAVENOUS
  Administered 2020-05-15: 60 mg via INTRAVENOUS

## 2020-05-15 MED ORDER — OXYCODONE HCL 5 MG PO TABS: 5.0000 mg | ORAL_TABLET | Freq: Once | ORAL | Status: DC | PRN

## 2020-05-15 MED ORDER — ALUM & MAG HYDROXIDE-SIMETH 200-200-20 MG/5ML PO SUSP: 30.0000 mL | ORAL | Status: DC | PRN

## 2020-05-15 MED ORDER — PHENYLEPHRINE HCL (PRESSORS) 10 MG/ML IV SOLN
INTRAVENOUS | Status: DC | PRN
  Administered 2020-05-15 (×3): 80 ug via INTRAVENOUS

## 2020-05-15 MED ORDER — SODIUM CHLORIDE 0.9 % IV SOLN
INTRAVENOUS | Status: AC
  Filled 2020-05-15: qty 2

## 2020-05-15 MED ORDER — SUGAMMADEX SODIUM 200 MG/2ML IV SOLN
INTRAVENOUS | Status: DC | PRN
Start: 2020-05-15 — End: 2020-05-15
  Administered 2020-05-15: 200 mg via INTRAVENOUS

## 2020-05-15 MED ORDER — GLYCOPYRROLATE 0.2 MG/ML IJ SOLN
INTRAMUSCULAR | Status: DC | PRN
  Administered 2020-05-15: .1 mg via INTRAVENOUS

## 2020-05-15 MED ORDER — HYDROMORPHONE HCL 1 MG/ML IJ SOLN
0.2500 mg | INTRAMUSCULAR | Status: DC | PRN
  Administered 2020-05-15 (×2): 0.25 mg via INTRAVENOUS

## 2020-05-15 MED ORDER — SIMETHICONE 80 MG PO CHEW: 80.0000 mg | CHEWABLE_TABLET | Freq: Four times a day (QID) | ORAL | Status: DC | PRN

## 2020-05-15 MED ORDER — SCOPOLAMINE 1 MG/3DAYS TD PT72
MEDICATED_PATCH | TRANSDERMAL | Status: AC
  Filled 2020-05-15: qty 1

## 2020-05-15 MED ORDER — LACTATED RINGERS IV SOLN: INTRAVENOUS | Status: DC

## 2020-05-15 MED ORDER — OXYCODONE HCL 5 MG/5ML PO SOLN: 5.0000 mg | Freq: Once | ORAL | Status: DC | PRN

## 2020-05-15 MED ORDER — LIDOCAINE 2% (20 MG/ML) 5 ML SYRINGE
INTRAMUSCULAR | Status: AC
  Filled 2020-05-15: qty 5

## 2020-05-15 MED ORDER — HYDROCODONE-ACETAMINOPHEN 5-325 MG PO TABS
1.0000 | ORAL_TABLET | ORAL | Status: DC | PRN
  Administered 2020-05-15: 1 via ORAL
  Administered 2020-05-15: 2 via ORAL
  Administered 2020-05-15 (×2): 1 via ORAL
  Administered 2020-05-16: 2 via ORAL
  Administered 2020-05-16 (×2): 1 via ORAL

## 2020-05-15 MED ORDER — KETOROLAC TROMETHAMINE 30 MG/ML IJ SOLN
30.0000 mg | Freq: Once | INTRAMUSCULAR | Status: AC | PRN
  Administered 2020-05-15: 30 mg via INTRAVENOUS

## 2020-05-15 MED ORDER — ONDANSETRON HCL 4 MG/2ML IJ SOLN: 4.0000 mg | Freq: Four times a day (QID) | INTRAMUSCULAR | Status: DC | PRN

## 2020-05-15 MED ORDER — HYDROMORPHONE HCL 1 MG/ML IJ SOLN
INTRAMUSCULAR | Status: AC
  Filled 2020-05-15: qty 1

## 2020-05-15 MED ORDER — HYDROCODONE-ACETAMINOPHEN 5-325 MG PO TABS
ORAL_TABLET | ORAL | Status: AC
  Filled 2020-05-15: qty 2

## 2020-05-15 MED ORDER — ROCURONIUM BROMIDE 10 MG/ML (PF) SYRINGE
PREFILLED_SYRINGE | INTRAVENOUS | Status: AC
  Filled 2020-05-15: qty 10

## 2020-05-15 MED ORDER — ZOLPIDEM TARTRATE 5 MG PO TABS: 5.0000 mg | ORAL_TABLET | Freq: Every evening | ORAL | Status: DC | PRN

## 2020-05-15 MED ORDER — ONDANSETRON HCL 4 MG/2ML IJ SOLN
INTRAMUSCULAR | Status: DC | PRN
  Administered 2020-05-15: 4 mg via INTRAVENOUS

## 2020-05-15 MED ORDER — IBUPROFEN 200 MG PO TABS
ORAL_TABLET | ORAL | Status: AC
  Filled 2020-05-15: qty 3

## 2020-05-15 MED ORDER — MIDAZOLAM HCL 5 MG/5ML IJ SOLN
INTRAMUSCULAR | Status: DC | PRN
  Administered 2020-05-15: 2 mg via INTRAVENOUS

## 2020-05-15 MED ORDER — ONDANSETRON HCL 4 MG/2ML IJ SOLN: 4.0000 mg | Freq: Four times a day (QID) | INTRAMUSCULAR | Status: DC

## 2020-05-15 MED ORDER — FENTANYL CITRATE (PF) 100 MCG/2ML IJ SOLN: 12.5000 ug | INTRAMUSCULAR | Status: DC | PRN

## 2020-05-15 MED ORDER — SCOPOLAMINE 1 MG/3DAYS TD PT72
1.0000 | MEDICATED_PATCH | TRANSDERMAL | Status: DC
  Administered 2020-05-15: 1.5 mg via TRANSDERMAL

## 2020-05-15 MED ORDER — PHENYLEPHRINE 40 MCG/ML (10ML) SYRINGE FOR IV PUSH (FOR BLOOD PRESSURE SUPPORT)
PREFILLED_SYRINGE | INTRAVENOUS | Status: AC
  Filled 2020-05-15: qty 10

## 2020-05-15 MED ORDER — PROMETHAZINE HCL 25 MG/ML IJ SOLN
6.2500 mg | INTRAMUSCULAR | Status: AC | PRN
  Administered 2020-05-15 (×2): 6.25 mg via INTRAVENOUS

## 2020-05-15 MED ORDER — BUPIVACAINE HCL (PF) 0.25 % IJ SOLN
INTRAMUSCULAR | Status: DC | PRN
  Administered 2020-05-15: 10 mL

## 2020-05-15 MED ORDER — FENTANYL CITRATE (PF) 250 MCG/5ML IJ SOLN
INTRAMUSCULAR | Status: AC
  Filled 2020-05-15: qty 5

## 2020-05-15 MED ORDER — MIDAZOLAM HCL 2 MG/2ML IJ SOLN
INTRAMUSCULAR | Status: AC
  Filled 2020-05-15: qty 2

## 2020-05-15 MED ORDER — MENTHOL 3 MG MT LOZG: 1.0000 | LOZENGE | OROMUCOSAL | Status: DC | PRN

## 2020-05-15 SURGICAL SUPPLY — 57 items
APPLICATOR ARISTA FLEXITIP XL (MISCELLANEOUS) ×2 IMPLANT
BLADE CLIPPER SENSICLIP SURGIC (BLADE) IMPLANT
BLADE SURG 15 STRL LF DISP TIS (BLADE) ×1 IMPLANT
BLADE SURG 15 STRL SS (BLADE) ×1
CABLE HIGH FREQUENCY MONO STRZ (ELECTRODE) IMPLANT
CATH ROBINSON RED A/P 16FR (CATHETERS) ×2 IMPLANT
COVER BACK TABLE 60X90IN (DRAPES) ×2 IMPLANT
COVER MAYO STAND STRL (DRAPES) ×2 IMPLANT
COVER WAND RF STERILE (DRAPES) ×2 IMPLANT
DECANTER SPIKE VIAL GLASS SM (MISCELLANEOUS) IMPLANT
DERMABOND ADVANCED (GAUZE/BANDAGES/DRESSINGS) ×1
DERMABOND ADVANCED .7 DNX12 (GAUZE/BANDAGES/DRESSINGS) ×1 IMPLANT
DRSG OPSITE POSTOP 3X4 (GAUZE/BANDAGES/DRESSINGS) ×2 IMPLANT
DURAPREP 26ML APPLICATOR (WOUND CARE) ×2 IMPLANT
ELECT REM PT RETURN 9FT ADLT (ELECTROSURGICAL)
ELECTRODE REM PT RTRN 9FT ADLT (ELECTROSURGICAL) IMPLANT
FORCEPS CUTTING 45CM 5MM (CUTTING FORCEPS) ×2 IMPLANT
GAUZE 4X4 16PLY RFD (DISPOSABLE) ×2 IMPLANT
GLOVE BIO SURGEON STRL SZ8 (GLOVE) ×2 IMPLANT
GLOVE BIOGEL PI IND STRL 6.5 (GLOVE) ×2 IMPLANT
GLOVE BIOGEL PI IND STRL 7.0 (GLOVE) ×2 IMPLANT
GLOVE BIOGEL PI INDICATOR 6.5 (GLOVE) ×2
GLOVE BIOGEL PI INDICATOR 7.0 (GLOVE) ×2
GLOVE SURG ORTHO 8.0 STRL STRW (GLOVE) ×6 IMPLANT
HEMOSTAT ARISTA ABSORB 3G PWDR (HEMOSTASIS) ×2 IMPLANT
HOLDER FOLEY CATH W/STRAP (MISCELLANEOUS) ×2 IMPLANT
KIT TURNOVER CYSTO (KITS) ×2 IMPLANT
LIGASURE IMPACT 36 18CM CVD LR (INSTRUMENTS) ×2 IMPLANT
LIGASURE LAP L-HOOKWIRE 5 44CM (INSTRUMENTS) IMPLANT
NEEDLE INSUFFLATION 120MM (ENDOMECHANICALS) ×4 IMPLANT
NS IRRIG 500ML POUR BTL (IV SOLUTION) ×2 IMPLANT
PACK LAVH (CUSTOM PROCEDURE TRAY) ×2 IMPLANT
PACK ROBOTIC GOWN (GOWN DISPOSABLE) ×6 IMPLANT
PACK TRENDGUARD 450 HYBRID PRO (MISCELLANEOUS) IMPLANT
PAD OB MATERNITY 4.3X12.25 (PERSONAL CARE ITEMS) ×2 IMPLANT
PAD PREP 24X48 CUFFED NSTRL (MISCELLANEOUS) ×2 IMPLANT
SCISSORS LAP 5X35 DISP (ENDOMECHANICALS) IMPLANT
SEALER TISSUE G2 CVD JAW 45CM (ENDOMECHANICALS) IMPLANT
SET SUCTION IRRIG HYDROSURG (IRRIGATION / IRRIGATOR) IMPLANT
SOLUTION ELECTROLUBE (MISCELLANEOUS) IMPLANT
STRIP CLOSURE SKIN 1/4X4 (GAUZE/BANDAGES/DRESSINGS) IMPLANT
SUT MNCRL 0 MO-4 VIOLET 18 CR (SUTURE) ×2 IMPLANT
SUT MNCRL 0 VIOLET 6X18 (SUTURE) ×1 IMPLANT
SUT MNCRL AB 0 CT1 27 (SUTURE) IMPLANT
SUT MON AB 2-0 CT1 36 (SUTURE) IMPLANT
SUT MONOCRYL 0 6X18 (SUTURE) ×1
SUT MONOCRYL 0 MO 4 18  CR/8 (SUTURE) ×2
SUT VICRYL 0 UR6 27IN ABS (SUTURE) ×2 IMPLANT
SUT VICRYL RAPIDE 3 0 (SUTURE) ×2 IMPLANT
SYR BULB IRRIG 60ML STRL (SYRINGE) IMPLANT
TOWEL OR 17X26 10 PK STRL BLUE (TOWEL DISPOSABLE) ×4 IMPLANT
TRAY FOLEY W/BAG SLVR 14FR (SET/KITS/TRAYS/PACK) ×2 IMPLANT
TRENDGUARD 450 HYBRID PRO PACK (MISCELLANEOUS)
TROCAR OPTI TIP 5M 100M (ENDOMECHANICALS) ×2 IMPLANT
TROCAR XCEL DIL TIP R 11M (ENDOMECHANICALS) ×2 IMPLANT
WARMER LAPAROSCOPE (MISCELLANEOUS) ×2 IMPLANT
WATER STERILE IRR 500ML POUR (IV SOLUTION) ×2 IMPLANT

## 2020-05-15 NOTE — Anesthesia Postprocedure Evaluation (Signed)
Anesthesia Post Note  Patient: Donna Park  Procedure(s) Performed: LAPAROSCOPIC ASSISTED VAGINAL HYSTERECTOMY WITH SALPINGECTOMY (Bilateral Abdomen)     Patient location during evaluation: PACU Anesthesia Type: General Level of consciousness: awake and alert Pain management: pain level controlled Vital Signs Assessment: post-procedure vital signs reviewed and stable Respiratory status: spontaneous breathing, nonlabored ventilation, respiratory function stable and patient connected to nasal cannula oxygen Cardiovascular status: blood pressure returned to baseline and stable Postop Assessment: no apparent nausea or vomiting Anesthetic complications: no    Last Vitals:  Vitals:   05/15/20 1100 05/15/20 1130  BP: 107/79 110/81  Pulse: (!) 58 (!) 59  Resp: 14 14  Temp: 36.7 C (!) 36.3 C  SpO2: 100% 99%    Last Pain:  Vitals:   05/15/20 1144  TempSrc:   PainSc: 10-Worst pain ever                 [name] S

## 2020-05-15 NOTE — Anesthesia Procedure Notes (Signed)
Procedure Name: Intubation Date/Time: 05/15/2020 7:36 AM Performed by: Bufford Spikes, CRNA Pre-anesthesia Checklist: Patient identified, Emergency Drugs available, Suction available and Patient being monitored Patient Re-evaluated:Patient Re-evaluated prior to induction Oxygen Delivery Method: Circle system utilized Preoxygenation: Pre-oxygenation with 100% oxygen Induction Type: IV induction Ventilation: Mask ventilation without difficulty Laryngoscope Size: Miller and 2 Grade View: Grade I Tube type: Oral Tube size: 7.0 mm Number of attempts: 1 Airway Equipment and Method: Stylet and Oral airway Placement Confirmation: ETT inserted through vocal cords under direct vision,  positive ETCO2 and breath sounds checked- equal and bilateral Secured at: 21 cm Tube secured with: Tape Dental Injury: Teeth and Oropharynx as per pre-operative assessment

## 2020-05-15 NOTE — Anesthesia Preprocedure Evaluation (Signed)
Anesthesia Evaluation  Patient identified by MRN, date of birth, ID band Patient awake    Reviewed: Allergy & Precautions, NPO status , Patient's Chart, lab work & pertinent test results  History of Anesthesia Complications (+) PONV  Airway Mallampati: II  TM Distance: >3 FB Neck ROM: Full    Dental no notable dental hx.    Pulmonary neg pulmonary ROS,    Pulmonary exam normal breath sounds clear to auscultation       Cardiovascular negative cardio ROS Normal cardiovascular exam Rhythm:Regular Rate:Normal     Neuro/Psych negative neurological ROS  negative psych ROS   GI/Hepatic Neg liver ROS, GERD  ,  Endo/Other  negative endocrine ROS  Renal/GU negative Renal ROS  negative genitourinary   Musculoskeletal negative musculoskeletal ROS (+)   Abdominal   Peds negative pediatric ROS (+)  Hematology negative hematology ROS (+)   Anesthesia Other Findings   Reproductive/Obstetrics negative OB ROS                             Anesthesia Physical Anesthesia Plan  ASA: II  Anesthesia Plan: General   Post-op Pain Management:    Induction: Intravenous  PONV Risk Score and Plan: 4 or greater and Ondansetron, Dexamethasone, Midazolam, Scopolamine patch - Pre-op and Treatment may vary due to age or medical condition  Airway Management Planned: Oral ETT  Additional Equipment:   Intra-op Plan:   Post-operative Plan: Extubation in OR  Informed Consent: I have reviewed the patients History and Physical, chart, labs and discussed the procedure including the risks, benefits and alternatives for the proposed anesthesia with the patient or authorized representative who has indicated his/her understanding and acceptance.     Dental advisory given  Plan Discussed with: CRNA and Surgeon  Anesthesia Plan Comments:         Anesthesia Quick Evaluation

## 2020-05-15 NOTE — Progress Notes (Signed)
Dr Corinna Capra rounding on patient.

## 2020-05-15 NOTE — Progress Notes (Signed)
Day of Surgery Procedure(s) (LRB): LAPAROSCOPIC ASSISTED VAGINAL HYSTERECTOMY WITH SALPINGECTOMY (Bilateral)  Subjective: Patient reports incisional pain and tolerating PO.    Objective: I have reviewed patient's vital signs, intake and output and medications.  General: alert, cooperative, appears stated age and no distress GI: incision: clean, dry and intact Vaginal Bleeding: none  Assessment: s/p Procedure(s) with comments: LAPAROSCOPIC ASSISTED VAGINAL HYSTERECTOMY WITH SALPINGECTOMY (Bilateral) - need bed: stable and progressing well  Plan: Advance diet Encourage ambulation Advance to PO medication  LOS: 0 days    Luz Lex 05/15/2020, 6:12 PM

## 2020-05-15 NOTE — Transfer of Care (Signed)
Immediate Anesthesia Transfer of Care Note  Patient: Donna Park  Procedure(s) Performed: LAPAROSCOPIC ASSISTED VAGINAL HYSTERECTOMY WITH SALPINGECTOMY (Bilateral Abdomen)  Patient Location: PACU  Anesthesia Type:General  Level of Consciousness: awake, alert  and oriented  Airway & Oxygen Therapy: Patient Spontanous Breathing and Patient connected to nasal cannula oxygen  Post-op Assessment: Report given to RN and Post -op Vital signs reviewed and stable  Post vital signs: Reviewed and stable  Last Vitals:  Vitals Value Taken Time  BP 104/64 05/15/20 1000  Temp 36.3 C 05/15/20 0927  Pulse 59 05/15/20 1015  Resp 14 05/15/20 1015  SpO2 100 % 05/15/20 1015  Vitals shown include unvalidated device data.  Last Pain:  Vitals:   05/15/20 1000  TempSrc:   PainSc: 8       Patients Stated Pain Goal: 5 (56/31/49 7026)  Complications: No apparent anesthesia complications

## 2020-05-16 DIAGNOSIS — J45909 Unspecified asthma, uncomplicated: Secondary | ICD-10-CM | POA: Diagnosis not present

## 2020-05-16 DIAGNOSIS — G43909 Migraine, unspecified, not intractable, without status migrainosus: Secondary | ICD-10-CM | POA: Diagnosis not present

## 2020-05-16 DIAGNOSIS — Z8249 Family history of ischemic heart disease and other diseases of the circulatory system: Secondary | ICD-10-CM | POA: Diagnosis not present

## 2020-05-16 DIAGNOSIS — K219 Gastro-esophageal reflux disease without esophagitis: Secondary | ICD-10-CM | POA: Diagnosis not present

## 2020-05-16 DIAGNOSIS — E669 Obesity, unspecified: Secondary | ICD-10-CM | POA: Diagnosis not present

## 2020-05-16 DIAGNOSIS — Z9884 Bariatric surgery status: Secondary | ICD-10-CM | POA: Diagnosis not present

## 2020-05-16 DIAGNOSIS — D259 Leiomyoma of uterus, unspecified: Secondary | ICD-10-CM | POA: Diagnosis not present

## 2020-05-16 DIAGNOSIS — R102 Pelvic and perineal pain: Secondary | ICD-10-CM | POA: Diagnosis not present

## 2020-05-16 DIAGNOSIS — M1990 Unspecified osteoarthritis, unspecified site: Secondary | ICD-10-CM | POA: Diagnosis not present

## 2020-05-16 LAB — CBC
HCT: 31.2 % — ABNORMAL LOW (ref 36.0–46.0)
Hemoglobin: 10.2 g/dL — ABNORMAL LOW (ref 12.0–15.0)
MCH: 31.9 pg (ref 26.0–34.0)
MCHC: 32.7 g/dL (ref 30.0–36.0)
MCV: 97.5 fL (ref 80.0–100.0)
Platelets: 187 10*3/uL (ref 150–400)
RBC: 3.2 MIL/uL — ABNORMAL LOW (ref 3.87–5.11)
RDW: 13.2 % (ref 11.5–15.5)
WBC: 10.5 10*3/uL (ref 4.0–10.5)
nRBC: 0 % (ref 0.0–0.2)

## 2020-05-16 LAB — SURGICAL PATHOLOGY

## 2020-05-16 MED ORDER — HYDROCODONE-ACETAMINOPHEN 5-325 MG PO TABS
ORAL_TABLET | ORAL | Status: AC
  Filled 2020-05-16: qty 2

## 2020-05-16 MED ORDER — HYDROCODONE-ACETAMINOPHEN 5-325 MG PO TABS
ORAL_TABLET | ORAL | Status: AC
  Filled 2020-05-16: qty 1

## 2020-05-16 MED ORDER — IBUPROFEN 200 MG PO TABS
ORAL_TABLET | ORAL | Status: AC
  Filled 2020-05-16: qty 3

## 2020-05-16 NOTE — Op Note (Signed)
NAME: Donna Park, REESER [facility] MEDICAL RECORD [id number] ACCOUNT [id number] DATE OF BIRTH:30-Jan-1971 FACILITY: [facility] LOCATION: [facility] PHYSICIAN:Margaurite Salido Dominic Pea, MD  OPERATIVE REPORT  DATE OF PROCEDURE:  05/15/2020  PREOPERATIVE DIAGNOSES:   1.  Uterine fibroids.  2.  Pelvic pain. 3.  Abnormal uterine bleeding.  POSTOPERATIVE DIAGNOSES:   1.  Uterine fibroids.  2.  Pelvic pain. 3.  Abnormal uterine bleeding.  PROCEDURE:  Laparoscopic assisted vaginal hysterectomy with bilateral salpingectomy.  SURGEON:  Louretta Shorten, MD   ASSISTANT:  Dr. Dory Horn.  ANESTHESIA:  General endotracheal.  INDICATIONS:  The patient is a 49 year old female with symptomatic fibroids causing abnormal uterine bleeding and pelvic pain.  She has had an endometrial ablation in the past, which was not successful.  She continues to bleed and has pain daily.  She  presents today for definitive surgical intervention, planned hysterectomy with removal of both fallopian tubes.  Risks and benefits of procedure have been discussed at length and informed consent was obtained.  FINDINGS:  At time of surgery were an 8-10 week size fibroid uterus.  Some adhesions to the fallopian tubes bilaterally.  Otherwise, normal appearing ovaries, cecum and liver.  DESCRIPTION OF PROCEDURE:  After adequate analgesia, the patient was placed in the dorsal lithotomy position, sterilely prepped and draped.  Bladder was sterilely drained.  Graves speculum was placed and a Cohen tenaculum was placed on the cervix.  A 1  cm infraumbilical skin incision was made.  Veress needle was inserted.  The abdomen was insufflated to dullness to percussion.  An 11 mm trocar was inserted.  The above findings were noted.  A 5 mm trocar was inserted to the left of the midline under  direct visualization.  The Gyrus cutting forceps were used to ligate across the mesosalpinx bilaterally, down across the uteroovarian ligaments bilaterally and across the round  ligament to the inferior portion of the broad ligaments.  This was done  bilaterally, removing the adhesions to the fallopian tubes and removing the fallopian tubes.  The bladder was then elevated and a small bladder flap was created.  The legs were repositioned.  The abdomen was desufflated.  Weighted speculum placed in the  vagina.  Posterior colpotomy was performed.  The cervix was circumscribed with Bovie cautery.  The LigaSure instrument was used to ligate across the uterosacral ligaments bilaterally, cardinal ligaments and bladder pillars bilaterally.  The anterior  vaginal mucosa was dissected off the anterior cervix and the anterior peritoneum was entered bluntly and a Deaver retractor was placed underneath the bladder.  The LigaSure instrument was then used to ligate and dissect up the cardinal ligaments to the  uterine vessels and to the inferior portion of the broad ligament bilaterally.  The uterus was then removed.  The uterosacral ligaments were grasped and suture ligated with 0 Monocryl suture bilaterally.  The posterior peritoneum was closed in a  pursestring fashion using 0 Monocryl suture.  The uterosacral ligaments were then plicated in the midline using figure-of-eights of 0 Monocryl suture.  The posterior and anterior vaginal mucosa were plicated in a vertical fashion with good approximation  and good hemostasis noted.  Foley catheter was placed with return of clear yellow urine.  The legs were repositioned.  The abdomen reinsufflated.  The laparoscope was inserted.  Suction irrigator was then used to irrigate the pelvis.  Small peritoneal  bleeders were coagulated with bipolar cautery.  The ovaries appeared to be normal.  Good ureteral peristalsis was noted bilaterally.  Small peritoneal  bleeders were then coated with Arista with good hemostasis achieved.  The abdomen was then desufflated.   Trocars were removed.  The infraumbilical skin incision was closed with 0 Vicryl interrupted  suture, in the fascia 3-0 Vicryl Rapide subcuticular suture.  The 5 mm site was closed with a 3-0 Vicryl Rapide subcuticular suture.  Incisions were injected  with 0.25% Marcaine, total of 10 mL used.  The patient was stable, transferred to recovery room.  Sponge and instrument count was normal x2.  Estimated blood loss was 400 mL.  The patient received 2 g of cefotetan preoperatively.  She will stay in the  recovery care center overnight.  VN/NUANCE  D:05/15/2020 T:05/15/2020 JOB:[id number]

## 2020-05-16 NOTE — Discharge Instructions (Signed)
Laparoscopically Assisted Vaginal Hysterectomy, Care After This sheet gives you information about how to care for yourself after your procedure. Your health care provider may also give you more specific instructions. If you have problems or questions, contact your health care provider. What can I expect after the procedure? After the procedure, it is common to have:  Soreness and numbness in your incision areas.  Abdominal pain. You will be given pain medicine to control it.  Vaginal bleeding and discharge. You will need to use a sanitary napkin after this procedure.  Sore throat from the breathing tube that was inserted during surgery. Follow these instructions at home: Medicines  Take over-the-counter and prescription medicines only as told by your health care provider.  Do not take aspirin or ibuprofen. These medicines can cause bleeding.  Do not drive or use heavy machinery while taking prescription pain medicine.  Do not drive for 24 hours if you were given a medicine to help you relax (sedative) during the procedure. Incision care   Follow instructions from your health care provider about how to take care of your incisions. Make sure you: ? Wash your hands with soap and water before you change your bandage (dressing). If soap and water are not available, use hand sanitizer. ? Change your dressing as told by your health care provider. ? Leave stitches (sutures), skin glue, or adhesive strips in place. These skin closures may need to stay in place for 2 weeks or longer. If adhesive strip edges start to loosen and curl up, you may trim the loose edges. Do not remove adhesive strips completely unless your health care provider tells you to do that.  Check your incision area every day for signs of infection. Check for: ? Redness, swelling, or pain. ? Fluid or blood. ? Warmth. ? Pus or a bad smell. Activity  Get regular exercise as told by your health care provider. You may be  told to take short walks every day and go farther each time.  Return to your normal activities as told by your health care provider. Ask your health care provider what activities are safe for you.  Do not douche, use tampons, or have sexual intercourse for at least 6 weeks, or until your health care provider gives you permission.  Do not lift anything that is heavier than 10 lb (4.5 kg), or the limit that your health care provider tells you, until he or she says that it is safe. General instructions  Do not take baths, swim, or use a hot tub until your health care provider approves. Take showers instead of baths.  Do not drive for 24 hours if you received a sedative.  Do not drive or operate heavy machinery while taking prescription pain medicine.  To prevent or treat constipation while you are taking prescription pain medicine, your health care provider may recommend that you: ? Drink enough fluid to keep your urine clear or pale yellow. ? Take over-the-counter or prescription medicines. ? Eat foods that are high in fiber, such as fresh fruits and vegetables, whole grains, and beans. ? Limit foods that are high in fat and processed sugars, such as fried and sweet foods.  Keep all follow-up visits as told by your health care provider. This is important. Contact a health care provider if:  You have signs of infection, such as: ? Redness, swelling, or pain around your incision sites. ? Fluid or blood coming from an incision. ? An incision that feels warm to the   touch. ? Pus or a bad smell coming from an incision.  Your incision breaks open.  Your pain medicine is not helping.  You feel dizzy or light-headed.  You have pain or bleeding when you urinate.  You have persistent nausea and vomiting.  You have blood, pus, or a bad-smelling discharge from your vagina. Get help right away if:  You have a fever.  You have severe abdominal pain.  You have chest pain.  You have  shortness of breath.  You faint.  You have pain, swelling, or redness in your leg.  You have heavy bleeding from your vagina. Summary  After the procedure, it is common to have abdominal pain and vaginal bleeding.  You should not drive or lift heavy objects until your health care provider says that it is safe.  Contact your health care provider if you have any symptoms of infection, excessive vaginal bleeding, nausea, vomiting, or shortness of breath. This information is not intended to replace advice given to you by your health care provider. Make sure you discuss any questions you have with your health care provider. Document Revised: 11/06/2017 Document Reviewed: 01/20/2017 Elsevier Patient Education  2020 [employer] Inc.  

## 2020-05-16 NOTE — Discharge Summary (Signed)
Physician Discharge Summary  Patient ID: Donna Park MRN: [id number] DOB/AGE: July 08, 1971 49 y.o.  Admit date: 05/15/2020 Discharge date: 05/16/2020  Admission Diagnoses:Pelvic pain, aub, fibroids  Discharge Diagnoses: same Active Problems:   S/P laparoscopic assisted vaginal hysterectomy (LAVH)   Discharged Condition: good  Hospital Course: Underwent uncomplicated LAVH/BSO.  Her post op care was unremarkable with quick return of bowel and bladder habits.  Pain well controlled on oral pain meds and desired d/c home  Consults: None  Significant Diagnostic Studies: labs: post op Hgb 10.2  Treatments: surgery: LAVH/BS  Discharge Exam: Blood pressure 117/77, pulse 82, temperature 98 F (36.7 C), resp. rate 16, height 5\' 7"  (1.702 m), weight 90.6 kg, SpO2 100 %. General appearance: alert, cooperative, appears stated age and no distress GI: soft, non-tender; bowel sounds normal; no masses,  no organomegaly Extremities: extremities normal, atraumatic, no cyanosis or edema Incision/Wound: C,D & Intact  Disposition: Discharge disposition: 01-Home or Self Care       Discharge Instructions    Call MD for:  difficulty breathing, headache or visual disturbances   Complete by: As directed    Call MD for:  persistant nausea and vomiting   Complete by: As directed    Call MD for:  redness, tenderness, or signs of infection (pain, swelling, redness, odor or green/yellow discharge around incision site)   Complete by: As directed    Call MD for:  severe uncontrolled pain   Complete by: As directed    Call MD for:  temperature >100.4   Complete by: As directed    Diet general   Complete by: As directed    Driving Restrictions   Complete by: As directed    No driving for 2 weeks   Increase activity slowly   Complete by: As directed    Lifting restrictions   Complete by: As directed    No lifting anything greater than 10 pounds (if you have to ask, don't lift it)   Remove  dressing in 24 hours   Complete by: As directed    Sexual Activity Restrictions   Complete by: As directed    Nothing in the vagina for 6 weeks        Signed: Luz Lex 05/16/2020, 8:11 AM

## 2020-05-21 MED FILL — XOLAIR 150 MG SOLR: 150 | 28 days supply | Qty: 4 | Fill #4

## 2020-05-21 MED FILL — IBUPROFEN 800 MG TABS: 800 | 30 days supply | Qty: 90 | Fill #0

## 2020-05-22 ENCOUNTER — Encounter: Payer: Self-pay | Admitting: Allergy & Immunology

## 2020-05-22 MED ORDER — FLUCONAZOLE 200 MG PO TABS: 200.0000 mg | ORAL_TABLET | Freq: Every day | ORAL | 0 refills | Status: DC

## 2020-05-24 ENCOUNTER — Ambulatory Visit (INDEPENDENT_AMBULATORY_CARE_PROVIDER_SITE_OTHER): Payer: 59

## 2020-05-24 ENCOUNTER — Other Ambulatory Visit: Payer: Self-pay

## 2020-05-24 DIAGNOSIS — J4540 Moderate persistent asthma, uncomplicated: Secondary | ICD-10-CM | POA: Diagnosis not present

## 2020-05-24 MED FILL — FLUCONAZOLE 200 MG TAB: 200 | 20 days supply | Qty: 20 | Fill #0 | Status: TO

## 2020-05-24 MED FILL — FLUCONAZOLE 200 MG TABLET: 200 | 20 days supply | Qty: 20 | Fill #0

## 2020-05-26 ENCOUNTER — Encounter: Payer: Self-pay | Admitting: Adult Health

## 2020-05-29 MED ORDER — IBUPROFEN 800 MG PO TABS: 800.0000 mg | ORAL_TABLET | Freq: Three times a day (TID) | ORAL | 0 refills | Status: DC | PRN

## 2020-05-29 NOTE — Telephone Encounter (Signed)
Call in #[phone] with no rf 

## 2020-06-04 MED FILL — TINIDAZOLE 500 MG TABS: 500 | 5 days supply | Qty: 10 | Fill #0

## 2020-06-07 ENCOUNTER — Other Ambulatory Visit: Payer: Self-pay

## 2020-06-07 ENCOUNTER — Ambulatory Visit (INDEPENDENT_AMBULATORY_CARE_PROVIDER_SITE_OTHER): Payer: 59

## 2020-06-07 DIAGNOSIS — J4540 Moderate persistent asthma, uncomplicated: Secondary | ICD-10-CM | POA: Diagnosis not present

## 2020-06-14 MED FILL — NASCOBAL 500 MCG NASAL SPRY: 500 | 28 days supply | Qty: 4 | Fill #0

## 2020-06-14 MED FILL — IBUPROFEN 800 MG TABS: 800 | 30 days supply | Qty: 90 | Fill #0

## 2020-06-14 MED FILL — XOLAIR 150 MG SOLR: 150 | 28 days supply | Qty: 4 | Fill #5

## 2020-06-19 ENCOUNTER — Other Ambulatory Visit: Payer: Self-pay

## 2020-06-19 ENCOUNTER — Other Ambulatory Visit (HOSPITAL_COMMUNITY): Payer: Self-pay | Admitting: Allergy & Immunology

## 2020-06-19 ENCOUNTER — Ambulatory Visit (INDEPENDENT_AMBULATORY_CARE_PROVIDER_SITE_OTHER): Payer: 59 | Admitting: Allergy & Immunology

## 2020-06-19 ENCOUNTER — Encounter: Payer: Self-pay | Admitting: Allergy & Immunology

## 2020-06-19 VITALS — BP 118/82 | HR 83 | Temp 98.3°F | Resp 18 | Ht 66.0 in | Wt 191.0 lb

## 2020-06-19 DIAGNOSIS — T7800XD Anaphylactic reaction due to unspecified food, subsequent encounter: Secondary | ICD-10-CM | POA: Diagnosis not present

## 2020-06-19 DIAGNOSIS — Z20822 Contact with and (suspected) exposure to covid-19: Secondary | ICD-10-CM

## 2020-06-19 DIAGNOSIS — J4540 Moderate persistent asthma, uncomplicated: Secondary | ICD-10-CM

## 2020-06-19 DIAGNOSIS — J3089 Other allergic rhinitis: Secondary | ICD-10-CM

## 2020-06-19 DIAGNOSIS — J302 Other seasonal allergic rhinitis: Secondary | ICD-10-CM

## 2020-06-19 DIAGNOSIS — L2089 Other atopic dermatitis: Secondary | ICD-10-CM | POA: Diagnosis not present

## 2020-06-19 MED ORDER — EPINEPHRINE 0.3 MG/0.3ML IJ SOAJ
0.3000 mg | Freq: Once | INTRAMUSCULAR | 2 refills | Status: DC
Start: 2020-06-19 — End: 2020-06-19

## 2020-06-19 MED ORDER — ARNUITY ELLIPTA 200 MCG/ACT IN AEPB: 1.0000 | INHALATION_SPRAY | Freq: Every day | RESPIRATORY_TRACT | 1 refills | Status: DC

## 2020-06-19 MED ORDER — ALBUTEROL SULFATE HFA 108 (90 BASE) MCG/ACT IN AERS: 1.0000 | INHALATION_SPRAY | Freq: Four times a day (QID) | RESPIRATORY_TRACT | 1 refills | Status: DC | PRN

## 2020-06-19 MED ORDER — TRELEGY ELLIPTA 200-62.5-25 MCG/INH IN AEPB: 1.0000 | INHALATION_SPRAY | Freq: Every day | RESPIRATORY_TRACT | 1 refills | Status: DC

## 2020-06-19 MED ORDER — AZELASTINE-FLUTICASONE 137-50 MCG/ACT NA SUSP: 1.0000 | Freq: Two times a day (BID) | NASAL | 1 refills | Status: DC | PRN

## 2020-06-19 MED ORDER — EPINEPHRINE 0.3 MG/0.3ML IJ SOAJ
0.3000 mg | Freq: Once | INTRAMUSCULAR | 0 refills | Status: AC
Start: 2020-06-19 — End: 2020-06-19

## 2020-06-19 MED ORDER — CETIRIZINE HCL 10 MG PO TABS: 20.0000 mg | ORAL_TABLET | Freq: Two times a day (BID) | ORAL | 1 refills | Status: DC

## 2020-06-19 NOTE — Patient Instructions (Addendum)
1. Moderate persistent asthma, uncomplicated - Lung testing looked normal today (copy of the spirometry provided). - We are not going to make any medication changes at this time.  - We can put you on the list for tezepelumab when it comes out onto the market.   - Daily controller medication(s): Trelegy 200/62.5/25 one puff once daily and Xolair every two weeks. - Prior to physical activity: albuterol 2 puffs 10-15 minutes before physical activity. - Rescue medications: albuterol 4 puffs every 4-6 hours as needed - Changes during respiratory infections or worsening symptoms: Increase Arnuity 267mcg to 1 puff twice daily for OEN TO TWO WEEKS. - Asthma control goals:  * Full participation in all desired activities (may need albuterol before activity) * Albuterol use two time or less a week on average (not counting use with activity) * Cough interfering with sleep two time or less a month * Oral steroids no more than once a year * No hospitalizations  2. Seasonal and perennial allergic rhinitis - Continue with cetirizine 20mg  twice daily. - Continue with Dymista two sprays per nostril up to twice daily. - Continue with nasal saline rinses.   3. Anaphylactic shock due to food - Continue to avoid shrimp.  - [name] is up to date.  4. Return in about 5 months (around 11/19/2020). This can be an in-person, a virtual Webex or a telephone follow up visit.   Please inform us of any Emergency Department visits, hospitalizations, or changes in symptoms. Call us before going to the ED for breathing or allergy symptoms since we might be able to fit you in for a sick visit. Feel free to contact us anytime with any questions, problems, or concerns.  It was a pleasure to see you again today!  Websites that have reliable patient information: 1. American Academy of Asthma, Allergy, and Immunology: www.aaaai.org 2. Food Allergy Research and Education (FARE): foodallergy.org 3. Mothers of Asthmatics:  http://www.asthmacommunitynetwork.org 4. American College of Allergy, Asthma, and Immunology: www.acaai.org   COVID-19 Vaccine Information can be found at: ShippingScam.co.uk For questions related to vaccine distribution or appointments, please email [email or web address] or call [phone].     "Like" [place] on Facebook and Instagram for our latest updates!        Make sure you are registered to vote! If you have moved or changed any of your contact information, you will need to get this updated before voting!  In some cases, you MAY be able to register to vote online: [email or web address]

## 2020-06-19 NOTE — Progress Notes (Signed)
FOLLOW UP  Date of Service/Encounter:  06/19/20   Assessment:   Moderate persistent asthma, uncomplicated  Seasonal and perennial allergic rhinitis(grass, trees, molds,cat, dust mite,cockroach) - s/p five years of allergen immunotherapy)  Anaphylactic shock due to food(shrimp) - tolerates other shellfish  Itching  S/p hysterectomy (June 2021)  S/p gastric bypass (2016)   Asthma Reportables: Severity:moderate persistent Risk:high Control:well controlled  Plan/Recommendations:   1. Moderate persistent asthma, uncomplicated - Lung testing looked normal today (copy of the spirometry provided). - We are not going to make any medication changes at this time.  - We can put you on the list for tezepelumab when it comes out onto the market.   - Daily controller medication(s): Trelegy 200/62.5/25 one puff once daily and Xolair every two weeks. - Prior to physical activity: albuterol 2 puffs 10-15 minutes before physical activity. - Rescue medications: albuterol 4 puffs every 4-6 hours as needed - Changes during respiratory infections or worsening symptoms: Increase Arnuity 272mcg to 1 puff twice daily for OEN TO TWO WEEKS. - Asthma control goals:  * Full participation in all desired activities (may need albuterol before activity) * Albuterol use two time or less a week on average (not counting use with activity) * Cough interfering with sleep two time or less a month * Oral steroids no more than once a year * No hospitalizations  2. Seasonal and perennial allergic rhinitis - Continue with cetirizine 20mg  twice daily. - Continue with Dymista two sprays per nostril up to twice daily. - Continue with nasal saline rinses.   3. Anaphylactic shock due to food - Continue to avoid shrimp.  - [name] is up to date.  4. Return in about 5 months (around 11/19/2020). This can be an in-person, a virtual Webex or a telephone follow up visit.   Subjective:   Donna Park is a 49 y.o. female presenting today for follow up of  Chief Complaint  Patient presents with   Asthma    follow up. well controlled per patient   Eczema    flaring up on her neck     Donna Park has a history of the following: Patient Active Problem List   Diagnosis Date Noted   S/P laparoscopic assisted vaginal hysterectomy (LAVH) 05/15/2020   Migraines    GERD (gastroesophageal reflux disease)    Asthma    Genital warts    Seasonal and perennial allergic rhinitis 02/10/2019   Moderate persistent asthma, uncomplicated 81/82/9937   Anaphylactic shock due to adverse food reaction 02/10/2019   Atopic dermatitis 02/10/2019   H/O gastric bypass 2016    History obtained from: chart review and patient.  Donna Park is a 49 y.o. female presenting for a follow up visit.  She was last seen in December 2020.  At that time, she had been doing very well.  The only thing messing up her asthma care was the price of her medications.  We tried to change her to [facility] and provided her with a sample.  Unfortunately, this did not seem to do much, as she had failed Symbicort in the past.  We kept her on Xolair.  We continued with her Arnuity which she adds twice daily during respiratory flares.  For her allergic rhinitis, we continue with cetirizine 20 mg twice daily as well as Dymista 2 sprays per nostril up to twice daily.  We recommended continued avoidance of shrimp.  Since last visit, she has mostly done well.  She actually talk to  me a few months after the last visit and requested a trial of Trelegy which is actually gone fairly well.  She is on the high dose Trelegy.  Asthma/Respiratory Symptom History: She remains on the Xolair as well as the Trelegy 1 puff once daily.  She receives her injections every 2 weeks. Donna Park's asthma has been well controlled. She has not required rescue medication, experienced nocturnal awakenings due to lower respiratory symptoms, nor have activities  of daily living been limited. She has required no Emergency Department or Urgent Care visits for her asthma. She has required zero courses of systemic steroids for asthma exacerbations since the last visit. ACT score today is 24, indicating excellent asthma symptom control.  However, she does report that she has had more shortness of breath episodes in the last couple months.  This is her first year in [place] and typically does not get this warm where she is from in [place].  She thinks this might be related to the heat.  She is open to changing biologics but would like to use more research before pursuing this.  Allergic Rhinitis Symptom History: She remains on the cetirizine 20 mg twice daily as well as Dymista.  She has not required any antibiotics and has not had much in the way of any allergy symptoms.  Food Allergy Symptom History: She continues to avoid shrimp.  She is just.  She does need a new EpiPen.  She recently had a hysterectomy.  She has also continued to lose weight and is down 13 pounds in 3 to 4 months.  Otherwise, there have been no changes to her past medical history, surgical history, family history, or social history.    Review of Systems  Constitutional: Negative.  Negative for chills, fever, malaise/fatigue and weight loss.  HENT: Negative.  Negative for congestion, ear discharge, ear pain and sore throat.   Eyes: Negative for pain, discharge and redness.  Respiratory: Negative for cough, sputum production, shortness of breath and wheezing.   Cardiovascular: Negative.  Negative for chest pain and palpitations.  Gastrointestinal: Negative for abdominal pain, constipation, diarrhea, heartburn, nausea and vomiting.  Skin: Negative.  Negative for itching and rash.  Neurological: Negative for dizziness and headaches.  Endo/Heme/Allergies: Negative for environmental allergies. Does not bruise/bleed easily.       Objective:   Blood pressure 118/82, pulse 83,  temperature 98.3 F (36.8 C), resp. rate 18, height 5\' 6"  (1.676 m), weight 191 lb (86.6 kg), SpO2 95 %. Body mass index is 30.83 kg/m.   Physical Exam:  Physical Exam Constitutional:      General: She is awake.     Appearance: She is well-developed.     Comments: Pleasant female.  Very talkative.  HENT:     Head: Normocephalic and atraumatic.     Right Ear: Tympanic membrane, ear canal and external ear normal.     Left Ear: Tympanic membrane, ear canal and external ear normal.     Nose: No nasal deformity, septal deviation, mucosal edema or rhinorrhea.     Right Turbinates: Enlarged, swollen and pale.     Left Turbinates: Enlarged, swollen and pale.     Right Sinus: No maxillary sinus tenderness or frontal sinus tenderness.     Left Sinus: No maxillary sinus tenderness or frontal sinus tenderness.     Mouth/Throat:     Mouth: Mucous membranes are not pale and not dry.     Pharynx: Uvula midline.     Comments: Tonsils  normally sized bilaterally.  No cobblestoning present. Eyes:     General:        Right eye: No discharge.        Left eye: No discharge.     Conjunctiva/sclera: Conjunctivae normal.     Right eye: Right conjunctiva is not injected. No chemosis.    Left eye: Left conjunctiva is not injected. No chemosis.    Pupils: Pupils are equal, round, and reactive to light.     Comments: Allergic shiners bilaterally.  Cardiovascular:     Rate and Rhythm: Normal rate and regular rhythm.     Heart sounds: Normal heart sounds.  Pulmonary:     Effort: Pulmonary effort is normal. No tachypnea, accessory muscle usage or respiratory distress.     Breath sounds: Normal breath sounds. No wheezing, rhonchi or rales.     Comments: Moving air well in all lung fields.  No increased work of breathing. Chest:     Chest wall: No tenderness.  Lymphadenopathy:     Cervical: No cervical adenopathy.  Skin:    Coloration: Skin is not pale.     Findings: No abrasion, erythema, petechiae or  rash. Rash is not papular, urticarial or vesicular.  Neurological:     Mental Status: She is alert.  Psychiatric:        Behavior: Behavior is cooperative.      Diagnostic studies:    Spirometry: results normal (FEV1: 2.16/83%, FVC: 2.39/75%, FEV1/FVC: 90%).    Spirometry consistent with normal pattern.  Allergy Studies: none       Salvatore Marvel, MD  [facility]

## 2020-06-20 ENCOUNTER — Encounter: Payer: Self-pay | Admitting: Vascular Surgery

## 2020-06-20 LAB — SARS-COV-2 SEMI-QUANTITATIVE TOTAL ANTIBODY, SPIKE
SARS-CoV-2 Semi-Quant Total Ab: 423.4 U/mL (ref <0.8)
SARS-CoV-2 Spike Ab Interp: POSITIVE

## 2020-06-20 MED FILL — ALBUTEROL SULFATE HFA 108 (: 108 (90 BAS | 84 days supply | Qty: 54 | Fill #0 | Status: TO

## 2020-06-20 MED FILL — TRELEGY ELLIPTA 200-62.5-25: 200-62.5-25 | 90 days supply | Qty: 180 | Fill #0 | Status: TO

## 2020-06-21 ENCOUNTER — Ambulatory Visit: Payer: 59

## 2020-06-21 ENCOUNTER — Encounter: Payer: Self-pay | Admitting: Allergy & Immunology

## 2020-06-25 ENCOUNTER — Other Ambulatory Visit: Payer: Self-pay | Admitting: Allergy & Immunology

## 2020-06-25 MED FILL — VALACYCLOVIR HCL 500 MG TAB: 500 | 90 days supply | Qty: 180 | Fill #0

## 2020-06-25 MED FILL — EPINEPHRINE 0.3 MG AUTO-INJ: 0.3 | 30 days supply | Qty: 2 | Fill #0

## 2020-06-25 MED FILL — OLOPATADINE HCL 0.2 % SOLN: 0.2 | 30 days supply | Qty: 3 | Fill #0

## 2020-06-25 MED FILL — AZELASTINE-FLUTICASONE 137-: 137-50 | 90 days supply | Qty: 69 | Fill #0

## 2020-06-25 MED FILL — IPRATROPIUM 0.06% SPRAY: 0.06 | 62 days supply | Qty: 45 | Fill #1

## 2020-06-25 MED FILL — ALBUTEROL SULFATE HFA 108 (: 108 (90 BAS | 75 days supply | Qty: 54 | Fill #0

## 2020-06-25 MED FILL — IBUPROFEN 800 MG TABS: 800 | 30 days supply | Qty: 90 | Fill #0

## 2020-06-25 MED FILL — NASCOBAL 500 MCG NASAL SPRY: 500 | 28 days supply | Qty: 4 | Fill #0

## 2020-07-02 MED FILL — TRELEGY ELLIPTA 200-62.5-25: 200-62.5-25 | 90 days supply | Qty: 180 | Fill #0

## 2020-07-04 MED FILL — OXYBUTYNIN CL ER 5 MG TAB: 5 | 30 days supply | Qty: 30 | Fill #0

## 2020-07-05 ENCOUNTER — Other Ambulatory Visit (HOSPITAL_COMMUNITY): Payer: Self-pay

## 2020-07-05 ENCOUNTER — Ambulatory Visit (INDEPENDENT_AMBULATORY_CARE_PROVIDER_SITE_OTHER): Payer: 59

## 2020-07-05 ENCOUNTER — Other Ambulatory Visit: Payer: Self-pay

## 2020-07-05 DIAGNOSIS — J4540 Moderate persistent asthma, uncomplicated: Secondary | ICD-10-CM | POA: Diagnosis not present

## 2020-07-05 MED FILL — IMIQUIMOD 5 % CREA: 5 | 7 days supply | Qty: 5 | Fill #1

## 2020-07-06 MED FILL — VIT D2 1.25 MG (50,000 UNIT: 1.25 MG | 84 days supply | Qty: 12 | Fill #0

## 2020-07-08 ENCOUNTER — Ambulatory Visit (HOSPITAL_COMMUNITY)
Admission: EM | Admit: 2020-07-08 | Discharge: 2020-07-08 | Disposition: A | Discharge status: Home / Self Care | Payer: 59

## 2020-07-08 ENCOUNTER — Emergency Department (HOSPITAL_BASED_OUTPATIENT_CLINIC_OR_DEPARTMENT_OTHER)
Admission: EM | Admit: 2020-07-08 | Discharge: 2020-07-08 | Disposition: A | Discharge status: Home / Self Care | Payer: 59 | Attending: Emergency Medicine | Admitting: Emergency Medicine

## 2020-07-08 ENCOUNTER — Encounter (HOSPITAL_BASED_OUTPATIENT_CLINIC_OR_DEPARTMENT_OTHER): Payer: Self-pay | Admitting: Emergency Medicine

## 2020-07-08 ENCOUNTER — Other Ambulatory Visit: Payer: Self-pay

## 2020-07-08 ENCOUNTER — Emergency Department (HOSPITAL_BASED_OUTPATIENT_CLINIC_OR_DEPARTMENT_OTHER): Payer: 59

## 2020-07-08 DIAGNOSIS — R300 Dysuria: Secondary | ICD-10-CM | POA: Diagnosis not present

## 2020-07-08 DIAGNOSIS — K769 Liver disease, unspecified: Secondary | ICD-10-CM | POA: Diagnosis not present

## 2020-07-08 DIAGNOSIS — R110 Nausea: Secondary | ICD-10-CM | POA: Insufficient documentation

## 2020-07-08 DIAGNOSIS — J45909 Unspecified asthma, uncomplicated: Secondary | ICD-10-CM | POA: Insufficient documentation

## 2020-07-08 DIAGNOSIS — R067 Sneezing: Secondary | ICD-10-CM | POA: Diagnosis not present

## 2020-07-08 DIAGNOSIS — N732 Unspecified parametritis and pelvic cellulitis: Secondary | ICD-10-CM | POA: Insufficient documentation

## 2020-07-08 DIAGNOSIS — R1030 Lower abdominal pain, unspecified: Secondary | ICD-10-CM | POA: Insufficient documentation

## 2020-07-08 DIAGNOSIS — R05 Cough: Secondary | ICD-10-CM | POA: Diagnosis not present

## 2020-07-08 DIAGNOSIS — K529 Noninfective gastroenteritis and colitis, unspecified: Secondary | ICD-10-CM | POA: Diagnosis not present

## 2020-07-08 DIAGNOSIS — N939 Abnormal uterine and vaginal bleeding, unspecified: Secondary | ICD-10-CM | POA: Diagnosis not present

## 2020-07-08 DIAGNOSIS — K429 Umbilical hernia without obstruction or gangrene: Secondary | ICD-10-CM | POA: Diagnosis not present

## 2020-07-08 DIAGNOSIS — N760 Acute vaginitis: Secondary | ICD-10-CM

## 2020-07-08 LAB — CBC WITH DIFFERENTIAL/PLATELET
Abs Immature Granulocytes: 0.02 10*3/uL (ref 0.00–0.07)
Basophils Absolute: 0 10*3/uL (ref 0.0–0.1)
Basophils Relative: 1 %
Eosinophils Absolute: 0.1 10*3/uL (ref 0.0–0.5)
Eosinophils Relative: 1 %
HCT: 34.6 % — ABNORMAL LOW (ref 36.0–46.0)
Hemoglobin: 11.3 g/dL — ABNORMAL LOW (ref 12.0–15.0)
Immature Granulocytes: 0 %
Lymphocytes Relative: 20 %
Lymphs Abs: 1.5 10*3/uL (ref 0.7–4.0)
MCH: 30.6 pg (ref 26.0–34.0)
MCHC: 32.7 g/dL (ref 30.0–36.0)
MCV: 93.8 fL (ref 80.0–100.0)
Monocytes Absolute: 0.8 10*3/uL (ref 0.1–1.0)
Monocytes Relative: 10 %
Neutro Abs: 5.1 10*3/uL (ref 1.7–7.7)
Neutrophils Relative %: 68 %
Platelets: 219 10*3/uL (ref 150–400)
RBC: 3.69 MIL/uL — ABNORMAL LOW (ref 3.87–5.11)
RDW: 13.2 % (ref 11.5–15.5)
WBC: 7.5 10*3/uL (ref 4.0–10.5)
nRBC: 0 % (ref 0.0–0.2)

## 2020-07-08 LAB — COMPREHENSIVE METABOLIC PANEL
ALT: 18 U/L (ref 0–44)
AST: 16 U/L (ref 15–41)
Albumin: 3.3 g/dL — ABNORMAL LOW (ref 3.5–5.0)
Alkaline Phosphatase: 45 U/L (ref 38–126)
Anion gap: 9 (ref 5–15)
BUN: 11 mg/dL (ref 6–20)
CO2: 24 mmol/L (ref 22–32)
Calcium: 8.1 mg/dL — ABNORMAL LOW (ref 8.9–10.3)
Chloride: 105 mmol/L (ref 98–111)
Creatinine, Ser: 0.77 mg/dL (ref 0.44–1.00)
GFR calc Af Amer: >60 mL/min (ref >60)
GFR calc non Af Amer: >60 mL/min (ref >60)
Glucose, Bld: 94 mg/dL (ref 70–99)
Potassium: 4.1 mmol/L (ref 3.5–5.1)
Sodium: 138 mmol/L (ref 135–145)
Total Bilirubin: 0.4 mg/dL (ref 0.3–1.2)
Total Protein: 6.8 g/dL (ref 6.5–8.1)

## 2020-07-08 LAB — URINALYSIS, MICROSCOPIC (REFLEX): WBC, UA: >50 WBC/hpf (ref 0–5)

## 2020-07-08 LAB — WET PREP, GENITAL
Clue Cells Wet Prep HPF POC: NONE SEEN
Sperm: NONE SEEN
Trich, Wet Prep: NONE SEEN
Yeast Wet Prep HPF POC: NONE SEEN

## 2020-07-08 LAB — URINALYSIS, ROUTINE W REFLEX MICROSCOPIC
Glucose, UA: NEGATIVE mg/dL
Ketones, ur: NEGATIVE mg/dL
Nitrite: NEGATIVE
Protein, ur: NEGATIVE mg/dL
Specific Gravity, Urine: >1.03 — ABNORMAL HIGH (ref 1.005–1.030)
pH: 5.5 (ref 5.0–8.0)

## 2020-07-08 MED ORDER — AMOXICILLIN-POT CLAVULANATE 875-125 MG PO TABS
1.0000 | ORAL_TABLET | Freq: Two times a day (BID) | ORAL | 0 refills | Status: DC
Start: 2020-07-08 — End: 2020-11-15

## 2020-07-08 MED ORDER — FLUCONAZOLE 200 MG PO TABS: 200.0000 mg | ORAL_TABLET | Freq: Every day | ORAL | 0 refills | Status: AC

## 2020-07-08 MED ORDER — IOHEXOL 300 MG/ML  SOLN
100.0000 mL | Freq: Once | INTRAMUSCULAR | Status: AC | PRN
  Administered 2020-07-08: 100 mL via INTRAVENOUS

## 2020-07-08 MED ORDER — HYDROCODONE-ACETAMINOPHEN 5-325 MG PO TABS: 1.0000 | ORAL_TABLET | Freq: Four times a day (QID) | ORAL | 0 refills | Status: DC | PRN

## 2020-07-08 NOTE — Discharge Instructions (Addendum)
The CAT scan today shows concern for cellulitis of your vaginal cuff where the surgery was performed.  There is significant inflammation and concern for infection.  No evidence of BV today.  Your kidneys and white blood cell count were all normal.  They recommended you start the antibiotic and call the OB/GYN office in the morning for follow-up.

## 2020-07-08 NOTE — ED Triage Notes (Signed)
Pt c/o lower abdominal/pelvic pain vaginal bleeding since Thursday during and after intercourse. Pt states pain has increased, difficulty urinating, distention, difficulty ambulating  2/2 pain. Denies fever, chills n/v/d. Per Cleone Slim, pt advised to go to ER STAT for higher level eval/tx and possible ultrasound. Pt agreed to go to [facility].

## 2020-07-08 NOTE — ED Triage Notes (Signed)
Pt sent from urgent care for further eval of abdominal pain and vaginal bleeding after intercourse.

## 2020-07-08 NOTE — ED Notes (Signed)
Patient ambulated to CT

## 2020-07-08 NOTE — ED Provider Notes (Signed)
[facility] EMERGENCY DEPARTMENT Provider Note   CSN: [id number] Arrival date & time: 07/08/20  1203     History Chief Complaint  Patient presents with  . Abdominal Pain  . Vaginal Bleeding    Donna Park is a 49 y.o. female.  This is a 49 y/o female with a history significant of recent LAVH on June 8 for uterine fibroids presents today for vaginal bleeding that began 4 days ago. Patient states about an after having sexual intercourse with her partner she noticed some suprapubic pain and dark red blood spot about 3 inches across soaking through her sheets. This bleeding continued for about 2 days and gradually stopped. Since the bleeding started she has had increase suprapubic pain with a feeling of fullness worsened with coughing, sneezing, and bending over with some associated nausea. She now has some red tinged vaginal secretions. States she was cleared for normal activity about 3 weeks ago and has had vaginal intercourse with partner without issue prior to this. She has also noticed dysuria and foul odor to her urine in the last couple of day. She denies fever, chills, chest pain, SOB, vomiting, constipation,  lightheadedness, LOC, weakness, or numbness.  Surgery performed by Dr. Louretta Shorten at [facility] in [place].       Past Medical History:  Diagnosis Date  . Anxiety   . Asthma   . Chicken pox   . Complication of anesthesia    slow to wake up from anesthesia  . DJD (degenerative joint disease)   . Genital warts   . GERD (gastroesophageal reflux disease)    resolved after gastric surgery  . H/O gastric bypass 2016  . History of iron deficiency anemia   . HSV infection   . Migraines   . Obese   . Pneumonia    Childhood history  . PONV (postoperative nausea and vomiting)   . Varicose vein of leg     Patient Active Problem List   Diagnosis Date Noted  . S/P laparoscopic assisted vaginal hysterectomy (LAVH) 05/15/2020  . Migraines   . GERD  (gastroesophageal reflux disease)   . Asthma   . Genital warts   . Seasonal and perennial allergic rhinitis 02/10/2019  . Moderate persistent asthma, uncomplicated 39/76/7341  . Anaphylactic shock due to adverse food reaction 02/10/2019  . Atopic dermatitis 02/10/2019  . H/O gastric bypass 2016    Past Surgical History:  Procedure Laterality Date  . ANKLE FRACTURE SURGERY Left 03/08/2009  . BREAST SURGERY Bilateral    Cyst Removal  . GASTRIC BYPASS  06/18/2015  . LAPAROSCOPIC VAGINAL HYSTERECTOMY WITH SALPINGECTOMY Bilateral 05/15/2020   Procedure: LAPAROSCOPIC ASSISTED VAGINAL HYSTERECTOMY WITH SALPINGECTOMY;  Surgeon: Louretta Shorten, MD;  Location: [facility];  Service: Gynecology;  Laterality: Bilateral;  need bed  . LASER ABLATION     Varicose veins  . TONSILLECTOMY AND ADENOIDECTOMY  2001  . TUBAL LIGATION  12/19/1999  . UPPER GI ENDOSCOPY    . Uterine ablation  12/2018     OB History   No obstetric history on file.     Family History  Problem Relation Age of Onset  . COPD Mother   . Heart disease Mother   . Alcohol abuse Mother   . Depression Mother   . Drug abuse Mother   . High Cholesterol Mother   . High blood pressure Mother   . COPD Father   . Aneurysm Father   . Heart disease Father   .  Early death Father   . Diabetes Father   . Drug abuse Father   . AAA (abdominal aortic aneurysm) Father        cause of death at 60  . Arthritis Sister   . Kidney disease Sister   . Asthma Neg Hx   . Allergic rhinitis Neg Hx     Social History   Tobacco Use  . Smoking status: Never Smoker  . Smokeless tobacco: Never Used  Vaping Use  . Vaping Use: Never used  Substance Use Topics  . Alcohol use: Yes    Comment: Social/Rare  . Drug use: Never    Home Medications Prior to Admission medications   Medication Sig Start Date End Date Taking? Authorizing Provider  acetaminophen (TYLENOL) 500 MG tablet Take by mouth.    [provider]    albuterol (VENTOLIN HFA) 108 (90 Base) MCG/ACT inhaler Inhale 1-2 puffs into the lungs every 6 (six) hours as needed for wheezing or shortness of breath. 06/19/20   Valentina Shaggy, MD  Azelastine-Fluticasone [phone] MCG/ACT SUSP Place 1 spray into both nostrils 2 (two) times daily as needed. 06/19/20   Valentina Shaggy, MD  CALCIUM PO Take 1 tablet by mouth 4 (four) times a week.    [provider]  cetirizine (ZYRTEC) 10 MG tablet Take 2 tablets (20 mg total) by mouth in the morning and at bedtime. 06/19/20   Valentina Shaggy, MD  Cyanocobalamin (NASCOBAL NA) Place 1 spray into the nose every Sunday.    [provider]  diazepam (VALIUM) 10 MG tablet Take 10 mg by mouth daily as needed for anxiety or sleep.  07/20/12   [provider]  fluconazole (DIFLUCAN) 150 MG tablet Take 150 mg by mouth every three (3) days as needed. 04/25/20   [provider]  fluconazole (DIFLUCAN) 200 MG tablet Take 1 tablet (200 mg total) by mouth daily. 05/22/20   Valentina Shaggy, MD  Fluticasone Furoate (ARNUITY ELLIPTA) 200 MCG/ACT AEPB Inhale 1 puff into the lungs daily. 06/19/20   Valentina Shaggy, MD  Fluticasone-Umeclidin-Vilant (TRELEGY ELLIPTA) 200-62.5-25 MCG/INH AEPB Inhale 1 puff into the lungs daily. 06/19/20   Valentina Shaggy, MD  hydrocortisone 2.5 % ointment Apply 1 application topically in the morning and at bedtime.  05/30/16   [provider]  Hypertonic Nasal Wash (SINUS RINSE REFILL) PACK Use one packet dissolved in water as needed. Patient taking differently: Place 1 each into the nose in the morning and at bedtime.  02/10/19   Valentina Shaggy, MD  ibuprofen (ADVIL) 800 MG tablet Take 1 tablet (800 mg total) by mouth 3 (three) times daily as needed (pain.). 05/29/20   Laurey Morale, MD  imiquimod Leroy Sea) 5 % cream Apply daily x 5 days Patient taking differently: Apply 1 application topically daily as needed (outbreaks).   04/12/20   Nafziger, Tommi Rumps, NP  ipratropium (ATROVENT) 0.06 % nasal spray Place 2 sprays into both nostrils 4 (four) times daily. Patient taking differently: Place 2 sprays into both nostrils 4 (four) times daily as needed (nasal congestion/allergies.).  11/15/19   Valentina Shaggy, MD  meclizine (ANTIVERT) 25 MG tablet Take 25 mg by mouth 3 (three) times daily as needed (dizziness/vertigo).  11/26/18   [provider]  MUCUS RELIEF ER 600 MG 12 hr tablet Take 600-1,200 mg by mouth 2 (two) times daily as needed (congestion).  12/12/18   [provider]  Olopatadine HCl 0.2 % SOLN PLACE  1 DROP INTO BOTH EYES TWICE DAILY AS NEEDED. 06/25/20   Valentina Shaggy, MD  omalizumab Arvid Right) 150 MG injection INJECT 300 MG INTO THE SKIN EVERY 14 DAYS Patient taking differently: Inject 300 mg into the skin every 14 (fourteen) days.  02/22/20   Valentina Shaggy, MD  ondansetron (ZOFRAN-ODT) 4 MG disintegrating tablet Place 4 mg under the tongue every 8 (eight) hours as needed (migraine/nausea).  07/14/14   [provider]  pantoprazole (PROTONIX) 40 MG tablet Take 40 mg by mouth at bedtime.  11/29/18   [provider]  Pediatric Multiple Vit-C-FA (MULTIVITAMIN ANIMAL SHAPES, WITH CA/FA,) with C & FA chewable tablet Chew 2 tablets by mouth daily.    [provider]  simethicone (MYLICON) 80 MG chewable tablet Chew 80 mg by mouth every 6 (six) hours as needed for flatulence.    [provider]  SUMAtriptan (IMITREX) 100 MG tablet Take 100 mg by mouth every 2 (two) hours as needed for migraine.  11/26/18   [provider]  triamcinolone ointment (KENALOG) 0.1 % Apply 1 application topically 2 (two) times daily as needed (eczema).  11/22/18   [provider]  valACYclovir (VALTREX) 500 MG tablet Take 1,000 mg by mouth at bedtime.  11/29/18   [provider]    Allergies    Cyclobenzaprine, Shellfish allergy, Sulfa antibiotics,  Carisoprodol, Morphine, and Adhesive [tape]  Review of Systems   Review of Systems  Constitutional: Negative for activity change, chills and fever.  HENT: Negative for congestion, sinus pain and sore throat.   Eyes: Negative.   Respiratory: Negative for cough, chest tightness and shortness of breath.   Gastrointestinal: Positive for abdominal distention, abdominal pain and nausea. Negative for vomiting.  Endocrine: Negative for cold intolerance and heat intolerance.  Genitourinary: Positive for dysuria, pelvic pain and vaginal bleeding. Negative for flank pain, hematuria and vaginal discharge.  Musculoskeletal: Negative for back pain and joint swelling.  Skin: Negative for rash.  Neurological: Negative for dizziness, weakness, light-headedness and headaches.  Psychiatric/Behavioral: Negative for agitation and confusion.    Physical Exam Updated Vital Signs BP 117/85 (BP Location: Right Arm)   Pulse 82   Temp 98.4 F (36.9 C) (Oral)   Resp 18   Ht 5\' 6"  (1.676 m)   Wt 86.6 kg   SpO2 100%   BMI 30.83 kg/m   Physical Exam Vitals reviewed.  Constitutional:      Appearance: She is well-developed and normal weight.  HENT:     Head: Normocephalic and atraumatic.     Mouth/Throat:     Mouth: Mucous membranes are moist.     Pharynx: Oropharynx is clear.  Eyes:     Extraocular Movements: Extraocular movements intact.     Pupils: Pupils are equal, round, and reactive to light.  Cardiovascular:     Rate and Rhythm: Normal rate and regular rhythm.     Heart sounds: Normal heart sounds.  Pulmonary:     Effort: Pulmonary effort is normal.     Breath sounds: Normal breath sounds.  Abdominal:     General: Abdomen is flat. Bowel sounds are normal. There is distension.     Palpations: Abdomen is soft.     Tenderness: There is abdominal tenderness in the periumbilical area and suprapubic area. There is no guarding or rebound.     Hernia: No hernia is present.  Genitourinary:     Vagina: No vaginal discharge, erythema or bleeding.     Uterus: Absent.  Comments: Vaginal cuff appears normal Skin:    General: Skin is warm and dry.     Capillary Refill: Capillary refill takes less than 2 seconds.  Neurological:     General: No focal deficit present.     Mental Status: She is alert and oriented to person, place, and time.  Psychiatric:        Mood and Affect: Mood normal.        Behavior: Behavior normal.     ED Results / Procedures / Treatments   Labs (all labs ordered are listed, but only abnormal results are displayed) Labs Reviewed  WET PREP, GENITAL - Abnormal; Notable for the following components:      Result Value   WBC, Wet Prep HPF POC MANY (*)    All other components within normal limits  COMPREHENSIVE METABOLIC PANEL - Abnormal; Notable for the following components:   Calcium 8.1 (*)    Albumin 3.3 (*)    All other components within normal limits  CBC WITH DIFFERENTIAL/PLATELET - Abnormal; Notable for the following components:   RBC 3.69 (*)    Hemoglobin 11.3 (*)    HCT 34.6 (*)    All other components within normal limits  URINALYSIS, ROUTINE W REFLEX MICROSCOPIC - Abnormal; Notable for the following components:   APPearance CLOUDY (*)    Specific Gravity, Urine >1.030 (*)    Hgb urine dipstick MODERATE (*)    Bilirubin Urine SMALL (*)    Leukocytes,Ua SMALL (*)    All other components within normal limits  URINALYSIS, MICROSCOPIC (REFLEX) - Abnormal; Notable for the following components:   Bacteria, UA MANY (*)    All other components within normal limits  URINE CULTURE  GC/CHLAMYDIA PROBE AMP () NOT AT [facility]    EKG None  Radiology CT ABDOMEN PELVIS W CONTRAST  Result Date: 07/08/2020 CLINICAL DATA:  Pelvic pain, negative beta HCG. EXAM: CT ABDOMEN AND PELVIS WITH CONTRAST TECHNIQUE: Multidetector CT imaging of the abdomen and pelvis was performed using the standard protocol following bolus administration of  intravenous contrast. CONTRAST:  147mL OMNIPAQUE IOHEXOL 300 MG/ML  SOLN COMPARISON:  None FINDINGS: Lower chest: Basilar atelectasis. No consolidation. No pleural effusion. Hepatobiliary: Lobular hepatic contours. No focal, suspicious hepatic lesion. No pericholecystic stranding. No biliary duct dilation. Pancreas: Pancreas without ductal dilation, inflammation or focal lesion. Spleen: Spleen normal in size and contour. Adrenals/Urinary Tract: Adrenal glands are normal. Symmetric renal enhancement without signs of hydronephrosis. Urinary bladder with some perivesical stranding in the setting of marked vaginal stranding, see below. Stomach/Bowel: Post gastric bypass with Roux-en-Y. Jejunojejunostomy mildly patulous in the LEFT upper quadrant. No signs of bowel obstruction. Perienteric stranding in the pelvis adjacent to the vaginal apex with marked thickening of the vagina. The appendix is normal. Mild pericolonic stranding also more pronounced adjacent to small bowel and vagina. Vascular/Lymphatic: No aneurysmal dilation. No significant atherosclerotic changes. No adenopathy but with numerous small scattered lymph nodes throughout the retroperitoneum. No pelvic lymphadenopathy. Reproductive: Marked stranding about and thickening of the vagina. Stranding about adjacent small bowel loops and urinary bladder. Tiny locules of gas are seen along the RIGHT lateral margin of small bowel loops in the pelvis. There is no pelvic abscess. Bilateral ovaries remain in place grossly normal by CT. Other: Tiny locules of gas near the vaginal apex and adjacent to small bowel loops without definitive signs of free intraperitoneal air. No sign of abscess. Extensive inflammation in the pelvis. No gas in the urinary bladder. Small  fat containing umbilical hernia. Musculoskeletal: No acute musculoskeletal process or destructive bone finding. IMPRESSION: 1. Marked stranding about the vaginal apex with marked thickening of the vagina.  Tiny locules of gas near the vaginal apex and adjacent to small bowel loops without definitive signs of free intraperitoneal air. Findings compatible with diffuse vaginal inflammation. Given appearance, dehiscence of the vaginal cuff is considered with extensive secondary inflammation of small bowel. No sign of abscess but with small locules of gas adjacent to the vaginal apex and small bowel loops. 2. Lobular hepatic contours, correlate with any clinical or laboratory evidence of liver disease. 3. Post gastric bypass with Roux-en-Y. No signs of bowel obstruction. 4. Small fat containing umbilical hernia. Electronically Signed   By: Zetta Bills M.D.   On: 07/08/2020 16:05    Procedures Procedures (including critical care time)  Medications Ordered in ED Medications  iohexol (OMNIPAQUE) 300 MG/ML solution 100 mL (100 mLs Intravenous Contrast Given 07/08/20 1529)    ED Course  I have reviewed the triage vital signs and the nursing notes.  Pertinent labs & imaging results that were available during my care of the patient were reviewed by me and considered in my medical decision making (see chart for details).  Clinical Course as of Jul 08 1610  Sun Jul 08, 2020  1547 CT ABDOMEN PELVIS W CONTRAST [JL]  1551 BP: 117/85 [JL]    Clinical Course User Index [JL] Iona Beard, MD   MDM Rules/Calculators/A&P                         49 y/o female who presents for vaginal bleeding, suprapubic pain, and dysuria s/p LAVH 8 weeks ago concern for wound dehiscence and cystitis. Hemodynamically stable with improved hgb of 11.3 from 10.2 after hysterectomy. Elevated WBC o UA likely represents sterile pyuria. CT of pelvis arked with diffuse vaginal inflammation with possible dehiscence of the vaginal cuff and extensive secondary inflammation of small bowel. Contacted OB/GYN office regarding patient. Final Clinical Impression(s) / ED Diagnoses Final diagnoses:  Vaginal bleeding    Rx / DC Orders ED  Discharge Orders    None       Iona Beard, MD 07/08/20 1635    Blanchie Dessert, MD 07/08/20 [phone]

## 2020-07-09 LAB — URINE CULTURE: Culture: <10000 — AB

## 2020-07-09 LAB — GC/CHLAMYDIA PROBE AMP (~~LOC~~) NOT AT ARMC
Chlamydia: NEGATIVE
Comment: NEGATIVE
Comment: NORMAL
Neisseria Gonorrhea: NEGATIVE

## 2020-07-10 DIAGNOSIS — R309 Painful micturition, unspecified: Secondary | ICD-10-CM | POA: Diagnosis not present

## 2020-07-10 DIAGNOSIS — N760 Acute vaginitis: Secondary | ICD-10-CM | POA: Diagnosis not present

## 2020-07-10 DIAGNOSIS — R102 Pelvic and perineal pain: Secondary | ICD-10-CM | POA: Diagnosis not present

## 2020-07-10 MED FILL — TINIDAZOLE 500 MG TAB: 500 | 5 days supply | Qty: 10 | Fill #0

## 2020-07-10 MED FILL — HYDROCODON-APAP 5-325: 5-325 | 7 days supply | Qty: 30 | Fill #0

## 2020-07-11 MED FILL — FLUCONAZOLE 150 MG TABS: 150 | 8 days supply | Qty: 4 | Fill #0

## 2020-07-16 MED FILL — XOLAIR 150 MG SOLR: 150 | 28 days supply | Qty: 4 | Fill #6

## 2020-07-19 ENCOUNTER — Other Ambulatory Visit: Payer: Self-pay

## 2020-07-19 ENCOUNTER — Ambulatory Visit (INDEPENDENT_AMBULATORY_CARE_PROVIDER_SITE_OTHER): Payer: 59

## 2020-07-19 DIAGNOSIS — J4540 Moderate persistent asthma, uncomplicated: Secondary | ICD-10-CM | POA: Diagnosis not present

## 2020-07-20 DIAGNOSIS — Z09 Encounter for follow-up examination after completed treatment for conditions other than malignant neoplasm: Secondary | ICD-10-CM | POA: Diagnosis not present

## 2020-07-20 DIAGNOSIS — R102 Pelvic and perineal pain: Secondary | ICD-10-CM | POA: Diagnosis not present

## 2020-08-02 ENCOUNTER — Ambulatory Visit (INDEPENDENT_AMBULATORY_CARE_PROVIDER_SITE_OTHER): Payer: 59

## 2020-08-02 ENCOUNTER — Other Ambulatory Visit: Payer: Self-pay

## 2020-08-02 DIAGNOSIS — J4540 Moderate persistent asthma, uncomplicated: Secondary | ICD-10-CM

## 2020-08-14 MED FILL — XOLAIR 150 MG SOLR: 150 | 28 days supply | Qty: 4 | Fill #7

## 2020-08-16 ENCOUNTER — Ambulatory Visit (INDEPENDENT_AMBULATORY_CARE_PROVIDER_SITE_OTHER): Payer: 59

## 2020-08-16 ENCOUNTER — Other Ambulatory Visit: Payer: Self-pay

## 2020-08-16 DIAGNOSIS — J4540 Moderate persistent asthma, uncomplicated: Secondary | ICD-10-CM | POA: Diagnosis not present

## 2020-08-23 ENCOUNTER — Encounter: Payer: Self-pay | Admitting: Adult Health

## 2020-08-24 MED FILL — OXYBUTYNIN CL ER 5 MG TAB: 5 | 30 days supply | Qty: 30 | Fill #1

## 2020-08-27 ENCOUNTER — Telehealth: Payer: Self-pay | Admitting: Neurology

## 2020-08-27 NOTE — Telephone Encounter (Signed)
Ivar Drape, Patient's primary care will be sending in a referral for migrianes. Will you call and put her on my schedule on a Friday at the end of October or December? She can only come on a Friday. I have let Megan know which Fridays I am working so please open up a spot on one of those days and place her in. I am including Megan in case you need to know which Fridays we are working. Caroline Sauger thanks

## 2020-08-28 ENCOUNTER — Other Ambulatory Visit: Payer: Self-pay | Admitting: Adult Health

## 2020-08-28 DIAGNOSIS — G43919 Migraine, unspecified, intractable, without status migrainosus: Secondary | ICD-10-CM

## 2020-08-30 ENCOUNTER — Other Ambulatory Visit: Payer: Self-pay

## 2020-08-30 ENCOUNTER — Other Ambulatory Visit: Payer: Self-pay | Admitting: Adult Health

## 2020-08-30 ENCOUNTER — Ambulatory Visit (INDEPENDENT_AMBULATORY_CARE_PROVIDER_SITE_OTHER): Payer: 59 | Admitting: *Deleted

## 2020-08-30 ENCOUNTER — Other Ambulatory Visit: Payer: Self-pay | Admitting: Allergy & Immunology

## 2020-08-30 DIAGNOSIS — J4540 Moderate persistent asthma, uncomplicated: Secondary | ICD-10-CM | POA: Diagnosis not present

## 2020-08-30 MED FILL — ALBUTEROL SULFATE HFA 108 (: 108 (90 BAS | 75 days supply | Qty: 54 | Fill #1

## 2020-08-30 MED FILL — SUMAtriptan SUCCINATE 100 M: 100 | 90 days supply | Qty: 27 | Fill #0

## 2020-08-30 MED FILL — PANTOPRAZOLE SOD DR 40 MG T: 40 | 30 days supply | Qty: 30 | Fill #0

## 2020-08-30 MED FILL — OLOPATADINE HCL 0.2 % SOLN: 0.2 | 30 days supply | Qty: 3 | Fill #1

## 2020-08-30 MED FILL — IBUPROFEN 800 MG TABS: 800 | 30 days supply | Qty: 90 | Fill #1

## 2020-08-30 MED FILL — IPRATROPIUM 0.06% SPRAY: 0.06 | 62 days supply | Qty: 45 | Fill #2

## 2020-08-30 NOTE — Telephone Encounter (Signed)
Please advise refilling this medication.

## 2020-08-31 ENCOUNTER — Encounter: Payer: Self-pay | Admitting: *Deleted

## 2020-08-31 ENCOUNTER — Other Ambulatory Visit: Payer: Self-pay | Admitting: *Deleted

## 2020-08-31 MED FILL — TRIAMCINOLONE 0.1% OINTMEN: 0.1 | 14 days supply | Qty: 30 | Fill #0

## 2020-08-31 MED FILL — DIAZEPAM 10 MG TABS: 10 | 30 days supply | Qty: 30 | Fill #0

## 2020-08-31 MED FILL — ONDANSETRON ODT 4 MG TABLET: 4 | 7 days supply | Qty: 20 | Fill #0

## 2020-09-03 MED FILL — NASCOBAL 500 MCG NASAL SPRY: 500 | 28 days supply | Qty: 4 | Fill #0

## 2020-09-05 MED FILL — VALACYCLOVIR HCL 500 MG TAB: 500 | 90 days supply | Qty: 180 | Fill #0

## 2020-09-06 MED FILL — OLOPATADINE HCL 0.2 % SOLN: 0.2 | 30 days supply | Qty: 3 | Fill #1

## 2020-09-06 MED FILL — AZELASTINE-FLUTICASONE 137-: 137-50 | 90 days supply | Qty: 69 | Fill #1

## 2020-09-10 MED FILL — XOLAIR 150 MG SOLR: 150 | 28 days supply | Qty: 4 | Fill #8

## 2020-09-11 DIAGNOSIS — Z90710 Acquired absence of both cervix and uterus: Secondary | ICD-10-CM | POA: Diagnosis not present

## 2020-09-11 DIAGNOSIS — N898 Other specified noninflammatory disorders of vagina: Secondary | ICD-10-CM | POA: Diagnosis not present

## 2020-09-11 DIAGNOSIS — Z23 Encounter for immunization: Secondary | ICD-10-CM | POA: Diagnosis not present

## 2020-09-13 ENCOUNTER — Ambulatory Visit: Payer: 59

## 2020-09-20 ENCOUNTER — Other Ambulatory Visit: Payer: Self-pay

## 2020-09-20 ENCOUNTER — Ambulatory Visit (INDEPENDENT_AMBULATORY_CARE_PROVIDER_SITE_OTHER): Payer: 59

## 2020-09-20 DIAGNOSIS — J4540 Moderate persistent asthma, uncomplicated: Secondary | ICD-10-CM

## 2020-10-03 MED FILL — XOLAIR 150 MG SOLR: 150 | 28 days supply | Qty: 4 | Fill #9

## 2020-10-04 ENCOUNTER — Other Ambulatory Visit: Payer: Self-pay

## 2020-10-04 ENCOUNTER — Ambulatory Visit (INDEPENDENT_AMBULATORY_CARE_PROVIDER_SITE_OTHER): Payer: 59 | Admitting: *Deleted

## 2020-10-04 DIAGNOSIS — J4540 Moderate persistent asthma, uncomplicated: Secondary | ICD-10-CM

## 2020-10-12 DIAGNOSIS — Z6830 Body mass index (BMI) 30.0-30.9, adult: Secondary | ICD-10-CM | POA: Diagnosis not present

## 2020-10-12 DIAGNOSIS — Z01419 Encounter for gynecological examination (general) (routine) without abnormal findings: Secondary | ICD-10-CM | POA: Diagnosis not present

## 2020-10-12 DIAGNOSIS — N3281 Overactive bladder: Secondary | ICD-10-CM | POA: Diagnosis not present

## 2020-10-13 ENCOUNTER — Encounter: Payer: Self-pay | Admitting: Adult Health

## 2020-10-17 ENCOUNTER — Other Ambulatory Visit (HOSPITAL_COMMUNITY): Payer: Self-pay | Admitting: Obstetrics and Gynecology

## 2020-10-17 MED FILL — MYRBETRIQ ER 50 MG TABLET: 50 | 90 days supply | Qty: 90 | Fill #0

## 2020-10-18 ENCOUNTER — Other Ambulatory Visit: Payer: Self-pay

## 2020-10-18 ENCOUNTER — Ambulatory Visit (INDEPENDENT_AMBULATORY_CARE_PROVIDER_SITE_OTHER): Payer: 59

## 2020-10-18 DIAGNOSIS — J4540 Moderate persistent asthma, uncomplicated: Secondary | ICD-10-CM

## 2020-10-24 ENCOUNTER — Other Ambulatory Visit: Payer: Self-pay | Admitting: Adult Health

## 2020-10-24 ENCOUNTER — Other Ambulatory Visit: Payer: Self-pay | Admitting: Allergy & Immunology

## 2020-10-24 MED FILL — IPRATROPIUM 0.06% SPRAY: 0.06 | 62 days supply | Qty: 45 | Fill #3

## 2020-10-24 MED FILL — AZELASTINE-FLUTICASONE 137-: 137-50 | 90 days supply | Qty: 69 | Fill #1

## 2020-10-24 MED FILL — TRELEGY ELLIPTA 200-62.5-25: 200-62.5-25 | 90 days supply | Qty: 180 | Fill #1

## 2020-10-24 MED FILL — MECLIZINE 25 MG TABLET: 25 | 30 days supply | Qty: 90 | Fill #0

## 2020-10-24 MED FILL — PANTOPRAZOLE SOD DR 40 MG T: 40 | 90 days supply | Qty: 90 | Fill #1

## 2020-10-25 ENCOUNTER — Other Ambulatory Visit (HOSPITAL_COMMUNITY): Payer: Self-pay | Admitting: Adult Health

## 2020-10-25 MED FILL — IBUPROFEN 800 MG TABS: 800 | 30 days supply | Qty: 90 | Fill #0

## 2020-10-25 MED FILL — TRIAMCINOLONE 0.1% OINTMENT: 0.1 | 30 days supply | Qty: 454 | Fill #0

## 2020-10-25 MED FILL — ONDANSETRON ODT 4 MG TABLET: 4 | 30 days supply | Qty: 90 | Fill #0

## 2020-10-29 MED FILL — ALBUTEROL SULFATE HFA 108 (: 108 (90 BAS | 75 days supply | Qty: 54 | Fill #0

## 2020-10-30 ENCOUNTER — Other Ambulatory Visit (HOSPITAL_COMMUNITY): Payer: Self-pay | Admitting: Adult Health

## 2020-10-30 ENCOUNTER — Other Ambulatory Visit: Payer: Self-pay | Admitting: *Deleted

## 2020-10-30 MED ORDER — DIAZEPAM 10 MG PO TABS: ORAL_TABLET | ORAL | 2 refills | Status: DC

## 2020-10-30 MED ORDER — IMIQUIMOD 5 % EX CREA: TOPICAL_CREAM | CUTANEOUS | 0 refills | Status: DC

## 2020-10-30 MED FILL — DIAZEPAM 10 MG TABS: 10 | 30 days supply | Qty: 30 | Fill #0

## 2020-10-30 MED FILL — IMIQUIMOD 5% CREAM PACKET: 5 | 30 days supply | Qty: 24 | Fill #0

## 2020-10-31 ENCOUNTER — Ambulatory Visit: Payer: 59

## 2020-11-09 ENCOUNTER — Ambulatory Visit (INDEPENDENT_AMBULATORY_CARE_PROVIDER_SITE_OTHER): Payer: 59 | Admitting: *Deleted

## 2020-11-09 ENCOUNTER — Other Ambulatory Visit: Payer: Self-pay

## 2020-11-09 DIAGNOSIS — J4540 Moderate persistent asthma, uncomplicated: Secondary | ICD-10-CM | POA: Diagnosis not present

## 2020-11-15 ENCOUNTER — Other Ambulatory Visit: Payer: Self-pay

## 2020-11-15 ENCOUNTER — Encounter: Payer: Self-pay | Admitting: Allergy & Immunology

## 2020-11-15 ENCOUNTER — Ambulatory Visit: Payer: 59

## 2020-11-15 ENCOUNTER — Ambulatory Visit (INDEPENDENT_AMBULATORY_CARE_PROVIDER_SITE_OTHER): Payer: 59 | Admitting: Allergy & Immunology

## 2020-11-15 VITALS — HR 76 | Temp 98.7°F | Resp 16 | Ht 66.0 in | Wt 199.4 lb

## 2020-11-15 DIAGNOSIS — Z20822 Contact with and (suspected) exposure to covid-19: Secondary | ICD-10-CM | POA: Diagnosis not present

## 2020-11-15 DIAGNOSIS — J3089 Other allergic rhinitis: Secondary | ICD-10-CM | POA: Diagnosis not present

## 2020-11-15 DIAGNOSIS — J4520 Mild intermittent asthma, uncomplicated: Secondary | ICD-10-CM | POA: Diagnosis not present

## 2020-11-15 DIAGNOSIS — J302 Other seasonal allergic rhinitis: Secondary | ICD-10-CM

## 2020-11-15 DIAGNOSIS — J4540 Moderate persistent asthma, uncomplicated: Secondary | ICD-10-CM

## 2020-11-15 DIAGNOSIS — L2089 Other atopic dermatitis: Secondary | ICD-10-CM

## 2020-11-15 DIAGNOSIS — T7800XD Anaphylactic reaction due to unspecified food, subsequent encounter: Secondary | ICD-10-CM

## 2020-11-15 NOTE — Progress Notes (Signed)
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 Moderate persistent asthma, uncomplicated - Lung testing looked good today.   - We are not going to make any medication changes at this time, but we will send in 3 month medication refills since you have met your deductible.  - We are also going to consider changing you to the new biologic when it hits the market.  - Daily controller medication(s): Trelegy 200/62.5/25 one puff once daily and Xolair every two weeks. - Prior to physical activity: albuterol 2 puffs 10-15 minutes before physical activity. - Rescue medications: albuterol 4 puffs every 4-6 hours as needed - Changes during respiratory infections or worsening symptoms: Increase Arnuity 268mg to 1 puff twice daily for OEN TO TWO WEEKS. - Asthma control goals:  * Full participation in all desired activities (may need albuterol before activity) * Albuterol use two time or less a week on average (not counting use with activity) * Cough interfering with sleep two time or less a month * Oral steroids no more than once a year * No hospitalizations  2. Seasonal and perennial allergic rhinitis - Continue with cetirizine 259mtwice daily. - Continue with Dymista two sprays per nostril up to twice daily. - Continue with nasal saline rinses.   3. Anaphylactic shock due to food - Continue to avoid shrimp.  - AuWynona Lunas up to date.  4. Return in about 6 months (around 05/16/2021).   Subjective:   TrDeloros Berettas a 49y.o. female presenting today for follow up of  Chief Complaint  Patient presents with  . Follow-up    TrIgnacia FellingpBrayas a history of the following: Patient Active Problem List   Diagnosis Date Noted  . S/P laparoscopic assisted vaginal hysterectomy (LAVH) 05/15/2020  . Migraines   . GERD (gastroesophageal reflux disease)   . Asthma   . Genital warts   . Seasonal and perennial allergic rhinitis 02/10/2019  . Moderate persistent asthma, uncomplicated 0316/94/5038. Anaphylactic shock due to adverse food reaction 02/10/2019  . Atopic dermatitis 02/10/2019  . H/O gastric bypass 2016    History obtained from: chart review and patient.  TrCharnitas a [age].o. female presenting for a follow up visit.  She was last seen in July 2021.  At that time, she was doing very well.  Her lung testing looked normal.  We continued her on Trelegy 1 puff once daily as well as Xolair every 2 weeks.  We also continued with albuterol as needed.  For her rhinitis, we continue with cetirizine and Dymista.  She has a history of anaphylaxis to shrimp and we recommended continued avoidance.  We made sure her epinephrine autoinjector was up-to-date.  Since last visit, she has done very well.  Asthma/Respiratory Symptom History: She remains on her Trelegy 1 puff once daily.  She has been using her rescue inhaler a little bit more over the past 2 to 3 weeks.  Overall, Advair seems to work better for her but this has never been covered by her insurance.  She remains on the Xolair every 2 weeks.  She does have  a lot of questions about possibly changing to the new anti-TSLP drug when it comes on the market.  She has not needed prednisone in a while, although there are times when she will likely need it but does not receive it.  She has a history of obesity and has been working hard on losing weight following her gastric bypass.  Allergic Rhinitis Symptom History: She remains on the cetirizine and the Dymista.  She has not  used any antibiotics at all since last visit.  Overall, symptoms are well controlled.  This is normally a good time of the year for her.  Food Allergy Symptom History: She has had no exposure to strep.  Her epinephrine autoinjector is up-to-date.   She is fully boosted to COVID-19.  She received her Moderna booster a few weeks ago.  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  She enjoys this quite a bit as she can relate her personal story to patients.  She does share a lot of pictures of her children.  She went to visit them in October in [place].  Otherwise, there have been no changes to her past medical history, surgical history, family history, or social history.    Review of Systems  Constitutional: Negative.  Negative for chills, fever, malaise/fatigue and weight loss.  HENT: Positive for congestion. Negative for ear discharge, ear pain and sinus pain.   Eyes: Negative for pain, discharge and redness.  Respiratory: Positive for shortness of breath. Negative for cough, sputum production and wheezing.   Cardiovascular: Negative.  Negative for chest pain and palpitations.  Gastrointestinal: Negative for abdominal pain, constipation, diarrhea, heartburn, nausea and vomiting.  Skin: Negative.  Negative for itching and rash.  Neurological: Negative for dizziness and headaches.  Endo/Heme/Allergies: Positive for environmental allergies. Does not bruise/bleed easily.       Objective:   Pulse 76, temperature 98.7 F (37.1 C), temperature source Temporal, resp. rate 16, height 5' 6" (1.676 m), weight 199 lb 6.4 oz (90.4 kg), SpO2 95 %. Body mass index is 32.18 kg/m.   Physical Exam:  Physical Exam Constitutional:      Appearance: She is well-developed.     Comments: She looks like she has lost weight.  Very friendly.  HENT:     Head: Normocephalic and atraumatic.     Right Ear: Tympanic membrane, ear canal and external ear normal.     Left Ear: Tympanic  membrane, ear canal and external ear normal.     Nose: No nasal deformity, septal deviation, mucosal edema, rhinorrhea or epistaxis.     Right Turbinates: Enlarged and swollen.     Left Turbinates: Enlarged and swollen.     Right Sinus: No maxillary sinus tenderness or frontal sinus tenderness.     Left Sinus: No maxillary sinus tenderness or frontal sinus tenderness.     Comments: No polyps.    Mouth/Throat:     Mouth: Oropharynx is clear and moist. Mucous membranes are not pale and not dry.     Pharynx: Uvula midline.  Eyes:     General:        Right eye: No discharge.        Left eye: No discharge.     Extraocular Movements: EOM normal.     Conjunctiva/sclera: Conjunctivae normal.     Right eye: Right conjunctiva is not injected. No chemosis.    Left eye: Left conjunctiva is not injected. No chemosis.    Pupils: Pupils are  equal, round, and reactive to light.  Cardiovascular:     Rate and Rhythm: Normal rate and regular rhythm.     Heart sounds: Normal heart sounds.  Pulmonary:     Effort: Pulmonary effort is normal. No tachypnea, accessory muscle usage or respiratory distress.     Breath sounds: Normal breath sounds. No wheezing, rhonchi or rales.     Comments: Moving air well in all lung fields.  No increased work of breathing. Chest:     Chest wall: No tenderness.  Lymphadenopathy:     Cervical: No cervical adenopathy.  Skin:    Coloration: Skin is not pale.     Findings: No abrasion, erythema, petechiae or rash. Rash is not papular, urticarial or vesicular.     Comments: Erythematous urticarial lesions noted.  Neurological:     Mental Status: She is alert.  Psychiatric:        Mood and Affect: Mood and affect normal.        Behavior: Behavior is cooperative.      Diagnostic studies:          Salvatore Marvel, MD  [facility]

## 2020-11-16 ENCOUNTER — Encounter: Payer: Self-pay | Admitting: Allergy & Immunology

## 2020-11-16 ENCOUNTER — Other Ambulatory Visit (HOSPITAL_COMMUNITY): Payer: Self-pay | Admitting: Allergy & Immunology

## 2020-11-16 MED ORDER — TRIAMCINOLONE ACETONIDE 0.1 % EX OINT: TOPICAL_OINTMENT | CUTANEOUS | 2 refills | Status: DC

## 2020-11-16 MED ORDER — ALBUTEROL SULFATE HFA 108 (90 BASE) MCG/ACT IN AERS: INHALATION_SPRAY | RESPIRATORY_TRACT | 0 refills | Status: DC

## 2020-11-16 MED ORDER — PANTOPRAZOLE SODIUM 40 MG PO TBEC: 40.0000 mg | DELAYED_RELEASE_TABLET | Freq: Every day | ORAL | 1 refills | Status: DC

## 2020-11-16 MED ORDER — FLUTICASONE-SALMETEROL 230-21 MCG/ACT IN AERO
2.0000 | INHALATION_SPRAY | Freq: Two times a day (BID) | RESPIRATORY_TRACT | 1 refills | Status: DC
Start: 2020-11-16 — End: 2021-06-12

## 2020-11-16 MED ORDER — OLOPATADINE HCL 0.2 % OP SOLN: OPHTHALMIC | 3 refills | Status: DC

## 2020-11-16 MED ORDER — AZELASTINE-FLUTICASONE 137-50 MCG/ACT NA SUSP: 1.0000 | Freq: Two times a day (BID) | NASAL | 1 refills | Status: DC | PRN

## 2020-11-16 MED ORDER — FLUCONAZOLE 150 MG PO TABS: 150.0000 mg | ORAL_TABLET | Freq: Every day | ORAL | 3 refills | Status: DC

## 2020-11-16 MED ORDER — IPRATROPIUM BROMIDE 0.06 % NA SOLN: 2.0000 | Freq: Four times a day (QID) | NASAL | 3 refills | Status: DC

## 2020-11-16 MED FILL — OLOPATADINE HCL 0.2 % SOLN: 0.2 | 12 days supply | Qty: 3 | Fill #0

## 2020-11-16 MED FILL — ADVAIR HFA 230-21 MCG INH: 230-21 | 90 days supply | Qty: 36 | Fill #0

## 2020-11-16 MED FILL — FLUCONAZOLE 150 MG TABS: 150 | 14 days supply | Qty: 14 | Fill #0

## 2020-11-16 NOTE — Patient Instructions (Addendum)
1. Moderate persistent asthma, uncomplicated - Lung testing looked good today.   - We are not going to make any medication changes at this time, but we will send in 3 month medication refills since you have met your deductible.  - We are also going to consider changing you to the new biologic when it hits the market.  - Daily controller medication(s): Trelegy 200/62.5/25 one puff once daily and Xolair every two weeks. - Prior to physical activity: albuterol 2 puffs 10-15 minutes before physical activity. - Rescue medications: albuterol 4 puffs every 4-6 hours as needed - Changes during respiratory infections or worsening symptoms: Increase Arnuity 246mg to 1 puff twice daily for OEN TO TWO WEEKS. - Asthma control goals:  * Full participation in all desired activities (may need albuterol before activity) * Albuterol use two time or less a week on average (not counting use with activity) * Cough interfering with sleep two time or less a month * Oral steroids no more than once a year * No hospitalizations  2. Seasonal and perennial allergic rhinitis - Continue with cetirizine 226mtwice daily. - Continue with Dymista two sprays per nostril up to twice daily. - Continue with nasal saline rinses.   3. Anaphylactic shock due to food - Continue to avoid shrimp.  - AuWynona Lunas up to date.  4. Return in about 6 months (around 05/16/2021).    Please inform usKoreaf any Emergency Department visits, hospitalizations, or changes in symptoms. Call usKoreaefore going to the ED for breathing or allergy symptoms since we might be able to fit you in for a sick visit. Feel free to contact usKoreanytime with any questions, problems, or concerns.  It was a pleasure to see you again today!  Websites that have reliable patient information: 1. American Academy of Asthma, Allergy, and Immunology: www.aaaai.org 2. Food Allergy Research and Education (FARE): foodallergy.org 3. Mothers of Asthmatics:  http://www.asthmacommunitynetwork.org 4. American College of Allergy, Asthma, and Immunology: www.acaai.org   COVID-19 Vaccine Information can be found at: [email or web address] questions related to vaccine distribution or appointments, please email [email or web address] or call [phone]    "Like" usKorean Facebook and Instagram for our latest updates!     HAPPY FALL!     Make sure you are registered to vote! If you have moved or changed any of your contact information, you will need to get this updated before voting!  In some cases, you MAY be able to register to vote online: [email or web address]

## 2020-11-19 MED FILL — TRIAMCINOLONE 0.1% OINTMENT: 0.1 | 30 days supply | Qty: 454 | Fill #0

## 2020-11-20 DIAGNOSIS — Z20822 Contact with and (suspected) exposure to covid-19: Secondary | ICD-10-CM | POA: Diagnosis not present

## 2020-11-20 MED FILL — XOLAIR 150 MG SOLR: 150 | 28 days supply | Qty: 4 | Fill #10

## 2020-11-21 LAB — SARS-COV-2 SEMI-QUANTITATIVE TOTAL ANTIBODY, SPIKE
SARS-CoV-2 Semi-Quant Total Ab: >2500 U/mL (ref <0.8)
SARS-CoV-2 Spike Ab Interp: POSITIVE

## 2020-11-22 ENCOUNTER — Ambulatory Visit (INDEPENDENT_AMBULATORY_CARE_PROVIDER_SITE_OTHER): Payer: 59

## 2020-11-22 DIAGNOSIS — J4540 Moderate persistent asthma, uncomplicated: Secondary | ICD-10-CM

## 2020-11-22 DIAGNOSIS — R350 Frequency of micturition: Secondary | ICD-10-CM | POA: Diagnosis not present

## 2020-11-22 DIAGNOSIS — R351 Nocturia: Secondary | ICD-10-CM | POA: Diagnosis not present

## 2020-11-22 MED FILL — VIT D2 1.25 MG (50,000 UNIT: 1.25 MG | 84 days supply | Qty: 12 | Fill #1

## 2020-11-29 ENCOUNTER — Ambulatory Visit: Payer: 59

## 2020-11-29 ENCOUNTER — Other Ambulatory Visit: Payer: 59

## 2020-11-29 DIAGNOSIS — Z20822 Contact with and (suspected) exposure to covid-19: Secondary | ICD-10-CM

## 2020-12-01 LAB — NOVEL CORONAVIRUS, NAA: SARS-CoV-2, NAA: NOT DETECTED

## 2020-12-01 LAB — SARS-COV-2, NAA 2 DAY TAT

## 2020-12-03 ENCOUNTER — Other Ambulatory Visit: Payer: Self-pay | Admitting: Adult Health

## 2020-12-03 MED FILL — VALACYCLOVIR HCL 500 MG TAB: 500 | 90 days supply | Qty: 180 | Fill #1

## 2020-12-04 ENCOUNTER — Other Ambulatory Visit (HOSPITAL_COMMUNITY): Payer: Self-pay | Admitting: Adult Health

## 2020-12-04 MED FILL — IBUPROFEN 800 MG TABS: 800 | 90 days supply | Qty: 270 | Fill #0

## 2020-12-05 ENCOUNTER — Other Ambulatory Visit: Payer: Self-pay

## 2020-12-05 ENCOUNTER — Other Ambulatory Visit (HOSPITAL_COMMUNITY): Payer: Self-pay | Admitting: Allergy & Immunology

## 2020-12-05 MED ORDER — EPINEPHRINE 0.3 MG/0.3ML IJ SOAJ
0.3000 mg | Freq: Once | INTRAMUSCULAR | 1 refills | Status: AC
Start: 2020-12-05 — End: 2020-12-05

## 2020-12-05 MED FILL — HYDROCORTISONE 2.5% OINT: 2.5 | 30 days supply | Qty: 454 | Fill #0

## 2020-12-05 MED FILL — EPINEPHRINE 0.3 MG AUTO-INJ: 0.3 | 2 days supply | Qty: 2 | Fill #0

## 2020-12-06 ENCOUNTER — Other Ambulatory Visit: Payer: Self-pay

## 2020-12-06 ENCOUNTER — Ambulatory Visit (INDEPENDENT_AMBULATORY_CARE_PROVIDER_SITE_OTHER): Payer: 59 | Admitting: *Deleted

## 2020-12-06 DIAGNOSIS — J4540 Moderate persistent asthma, uncomplicated: Secondary | ICD-10-CM | POA: Diagnosis not present

## 2020-12-06 MED FILL — FLUCONAZOLE 150 MG TABS: 150 | 14 days supply | Qty: 14 | Fill #1

## 2020-12-13 ENCOUNTER — Ambulatory Visit: Payer: 59

## 2020-12-20 ENCOUNTER — Ambulatory Visit: Payer: 59

## 2020-12-20 MED FILL — XOLAIR 150 MG SOLR: 150 | 28 days supply | Qty: 4 | Fill #11

## 2020-12-21 ENCOUNTER — Ambulatory Visit (INDEPENDENT_AMBULATORY_CARE_PROVIDER_SITE_OTHER): Payer: 59 | Admitting: *Deleted

## 2020-12-21 ENCOUNTER — Other Ambulatory Visit: Payer: Self-pay

## 2020-12-21 DIAGNOSIS — J4540 Moderate persistent asthma, uncomplicated: Secondary | ICD-10-CM

## 2020-12-27 ENCOUNTER — Telehealth: Payer: Self-pay | Admitting: Allergy & Immunology

## 2020-12-27 NOTE — Telephone Encounter (Signed)
I discussed the new biologic with the patient that came on the market for controlling asthma.  She is interested in giving it a try.  I will route this call to Tammy to get the approval process started.   Mel Almond - can you contact the patient to make an appointment to get a sample of this medication?  Salvatore Marvel, MD Allergy and [facility] [place]

## 2020-12-28 NOTE — Telephone Encounter (Signed)
Will work on approval and mail pt enrollment form

## 2020-12-29 NOTE — Telephone Encounter (Signed)
Thanks, Tam Tam!   I talked to the patient and she wants to finish the Xolair that we already have on hand for her before getting the new biologic.  Salvatore Marvel, MD Allergy and [facility] [place]

## 2021-01-03 ENCOUNTER — Ambulatory Visit (INDEPENDENT_AMBULATORY_CARE_PROVIDER_SITE_OTHER): Payer: 59

## 2021-01-03 ENCOUNTER — Other Ambulatory Visit: Payer: Self-pay

## 2021-01-03 DIAGNOSIS — J4540 Moderate persistent asthma, uncomplicated: Secondary | ICD-10-CM | POA: Diagnosis not present

## 2021-01-17 ENCOUNTER — Other Ambulatory Visit: Payer: Self-pay

## 2021-01-17 ENCOUNTER — Ambulatory Visit (INDEPENDENT_AMBULATORY_CARE_PROVIDER_SITE_OTHER): Payer: 59

## 2021-01-17 DIAGNOSIS — J4540 Moderate persistent asthma, uncomplicated: Secondary | ICD-10-CM

## 2021-01-17 DIAGNOSIS — J4550 Severe persistent asthma, uncomplicated: Secondary | ICD-10-CM | POA: Diagnosis not present

## 2021-01-17 MED ORDER — NON FORMULARY
1.9100 mL | Freq: Once | Status: AC
  Administered 2021-01-17: 1.91 mL via SUBCUTANEOUS

## 2021-01-21 ENCOUNTER — Other Ambulatory Visit: Payer: Self-pay | Admitting: Allergy & Immunology

## 2021-01-31 ENCOUNTER — Ambulatory Visit: Payer: 59

## 2021-02-08 ENCOUNTER — Other Ambulatory Visit (HOSPITAL_COMMUNITY): Payer: Self-pay | Admitting: Adult Health

## 2021-02-08 ENCOUNTER — Other Ambulatory Visit: Payer: Self-pay

## 2021-02-08 ENCOUNTER — Encounter: Payer: Self-pay | Admitting: Adult Health

## 2021-02-08 ENCOUNTER — Ambulatory Visit (INDEPENDENT_AMBULATORY_CARE_PROVIDER_SITE_OTHER): Payer: 59 | Admitting: Adult Health

## 2021-02-08 VITALS — BP 110/80 | HR 87 | Temp 98.0°F | Ht 66.75 in | Wt 189.0 lb

## 2021-02-08 DIAGNOSIS — Z0000 Encounter for general adult medical examination without abnormal findings: Secondary | ICD-10-CM | POA: Diagnosis not present

## 2021-02-08 DIAGNOSIS — F5101 Primary insomnia: Secondary | ICD-10-CM

## 2021-02-08 DIAGNOSIS — Z1211 Encounter for screening for malignant neoplasm of colon: Secondary | ICD-10-CM

## 2021-02-08 DIAGNOSIS — A630 Anogenital (venereal) warts: Secondary | ICD-10-CM

## 2021-02-08 DIAGNOSIS — J4550 Severe persistent asthma, uncomplicated: Secondary | ICD-10-CM | POA: Diagnosis not present

## 2021-02-08 DIAGNOSIS — K219 Gastro-esophageal reflux disease without esophagitis: Secondary | ICD-10-CM | POA: Diagnosis not present

## 2021-02-08 DIAGNOSIS — R300 Dysuria: Secondary | ICD-10-CM

## 2021-02-08 DIAGNOSIS — Z9884 Bariatric surgery status: Secondary | ICD-10-CM | POA: Diagnosis not present

## 2021-02-08 DIAGNOSIS — E559 Vitamin D deficiency, unspecified: Secondary | ICD-10-CM | POA: Diagnosis not present

## 2021-02-08 DIAGNOSIS — G43919 Migraine, unspecified, intractable, without status migrainosus: Secondary | ICD-10-CM | POA: Diagnosis not present

## 2021-02-08 DIAGNOSIS — K5900 Constipation, unspecified: Secondary | ICD-10-CM

## 2021-02-08 LAB — IBC + FERRITIN
Ferritin: 12.1 ng/mL (ref 10.0–291.0)
Iron: 58 ug/dL (ref 42–145)
Saturation Ratios: 14.9 % — ABNORMAL LOW (ref 20.0–50.0)
Transferrin: 278 mg/dL (ref 212.0–360.0)

## 2021-02-08 LAB — COMPREHENSIVE METABOLIC PANEL
ALT: 25 U/L (ref 0–35)
AST: 24 U/L (ref 0–37)
Albumin: 3.7 g/dL (ref 3.5–5.2)
Alkaline Phosphatase: 46 U/L (ref 39–117)
BUN: 14 mg/dL (ref 6–23)
CO2: 29 mEq/L (ref 19–32)
Calcium: 8.7 mg/dL (ref 8.4–10.5)
Chloride: 106 mEq/L (ref 96–112)
Creatinine, Ser: 0.65 mg/dL (ref 0.40–1.20)
GFR: 104.39 mL/min (ref >60.00)
Glucose, Bld: 94 mg/dL (ref 70–99)
Potassium: 3.9 mEq/L (ref 3.5–5.1)
Sodium: 139 mEq/L (ref 135–145)
Total Bilirubin: 0.5 mg/dL (ref 0.2–1.2)
Total Protein: 6.4 g/dL (ref 6.0–8.3)

## 2021-02-08 LAB — CBC WITH DIFFERENTIAL/PLATELET
Basophils Absolute: 0 10*3/uL (ref 0.0–0.1)
Basophils Relative: 0.6 % (ref 0.0–3.0)
Eosinophils Absolute: 0 10*3/uL (ref 0.0–0.7)
Eosinophils Relative: 0.8 % (ref 0.0–5.0)
HCT: 37.8 % (ref 36.0–46.0)
Hemoglobin: 12.7 g/dL (ref 12.0–15.0)
Lymphocytes Relative: 37.9 % (ref 12.0–46.0)
Lymphs Abs: 1.4 10*3/uL (ref 0.7–4.0)
MCHC: 33.5 g/dL (ref 30.0–36.0)
MCV: 91.9 fl (ref 78.0–100.0)
Monocytes Absolute: 0.4 10*3/uL (ref 0.1–1.0)
Monocytes Relative: 11.2 % (ref 3.0–12.0)
Neutro Abs: 1.9 10*3/uL (ref 1.4–7.7)
Neutrophils Relative %: 49.5 % (ref 43.0–77.0)
Platelets: 174 10*3/uL (ref 150.0–400.0)
RBC: 4.11 Mil/uL (ref 3.87–5.11)
RDW: 13 % (ref 11.5–15.5)
WBC: 3.8 10*3/uL — ABNORMAL LOW (ref 4.0–10.5)

## 2021-02-08 LAB — LIPID PANEL
Cholesterol: 153 mg/dL (ref 0–200)
HDL: 70.7 mg/dL (ref >39.00)
LDL Cholesterol: 74 mg/dL (ref 0–99)
NonHDL: 82.26
Total CHOL/HDL Ratio: 2
Triglycerides: 43 mg/dL (ref 0.0–149.0)
VLDL: 8.6 mg/dL (ref 0.0–40.0)

## 2021-02-08 LAB — POCT URINALYSIS DIPSTICK
Bilirubin, UA: POSITIVE
Blood, UA: NEGATIVE
Glucose, UA: NEGATIVE
Ketones, UA: NEGATIVE
Leukocytes, UA: NEGATIVE
Nitrite, UA: NEGATIVE
Protein, UA: NEGATIVE
Spec Grav, UA: 1.025 (ref 1.010–1.025)
Urobilinogen, UA: 0.2 E.U./dL
pH, UA: 6 (ref 5.0–8.0)

## 2021-02-08 LAB — HEMOGLOBIN A1C: Hgb A1c MFr Bld: 5.4 % (ref 4.6–6.5)

## 2021-02-08 LAB — TSH: TSH: 2.21 u[IU]/mL (ref 0.35–4.50)

## 2021-02-08 LAB — VITAMIN B12: Vitamin B-12: 243 pg/mL (ref 211–911)

## 2021-02-08 LAB — VITAMIN D 25 HYDROXY (VIT D DEFICIENCY, FRACTURES): VITD: 25.52 ng/mL — ABNORMAL LOW (ref 30.00–100.00)

## 2021-02-08 MED ORDER — SUMATRIPTAN SUCCINATE 100 MG PO TABS: 100.0000 mg | ORAL_TABLET | ORAL | 3 refills | Status: DC | PRN

## 2021-02-08 MED ORDER — PANTOPRAZOLE SODIUM 40 MG PO TBEC: 40.0000 mg | DELAYED_RELEASE_TABLET | Freq: Every day | ORAL | 3 refills | Status: DC

## 2021-02-08 MED ORDER — DIAZEPAM 10 MG PO TABS: ORAL_TABLET | ORAL | 2 refills | Status: DC

## 2021-02-08 MED ORDER — TRAZODONE HCL 50 MG PO TABS: 25.0000 mg | ORAL_TABLET | Freq: Every evening | ORAL | 0 refills | Status: DC | PRN

## 2021-02-08 MED ORDER — VALACYCLOVIR HCL 500 MG PO TABS: 1000.0000 mg | ORAL_TABLET | Freq: Every day | ORAL | 3 refills | Status: AC

## 2021-02-08 MED ORDER — IBUPROFEN 800 MG PO TABS: 800.0000 mg | ORAL_TABLET | Freq: Three times a day (TID) | ORAL | 0 refills | Status: DC | PRN

## 2021-02-08 MED FILL — traZODone HCL 50 MG TABS: 50 | 90 days supply | Qty: 90 | Fill #0

## 2021-02-08 NOTE — Progress Notes (Signed)
Subjective:    Patient ID: Donna Park, female    DOB: 1971/07/06, 50 y.o.   MRN: [id number]  HPI  Patient presents for yearly preventative medicine examination. She is a pleasant 50 year old female who  has a past medical history of Anxiety, Asthma, Chicken pox, Complication of anesthesia, DJD (degenerative joint disease), Genital warts, GERD (gastroesophageal reflux disease), H/O gastric bypass (2016), History of iron deficiency anemia, HSV infection, Migraines, Obese, Pneumonia, PONV (postoperative nausea and vomiting), and Varicose vein of leg.   She has changed jobs and is currently at [employer] and [place] -currently managed by allergy and asthma.  She is prescribed an albuterol inhaler, Advil inhaler, and Xolair injection.  Migraine headaches-frequent.  Uses Imitrex as needed  GERD-uses Protonix daily and feels well controlled on this medication  HSV-takes Valtrex 500 mg twice daily.  Denies any recent outbreaks  H/o Gastric Bypass Surgery -surgery in 2016.  She denies abdominal pain, nausea, vomiting.  She does report having alternating bowel habits with mostly constipation since her bariatric surgery.  She would also like to establish care with a bariatric surgery center in [facility] Readings from Last 3 Encounters:  02/08/21 189 lb (85.7 kg)  11/15/20 199 lb 6.4 oz (90.4 kg)  07/08/20 191 lb (86.6 kg)   Chronic Yeast Infections -currently believes she is experiencing yeast infection.  Has itching and burning around her vagina.  Has used Diflucan in the past.   Insomnia -reports longstanding history, tried multiple medications in the past including Ambien and melatonin. Currently prescribed Valium.   Reports that she has a hard time falling asleep at night.  Has to get up at 6:30 in the morning to be at work.  Does practice good sleep hygiene  Dysuria -reports last week she had episodes of frequency and dysuria with lower pelvic pain.  She would  like to make sure she does not have a urinary tract infection  Vitamin D Deficiency - takes Vitamin D 50,000 units weekly    All immunizations and health maintenance protocols were reviewed with the patient and needed orders were placed.  Appropriate screening laboratory values were ordered for the patient including screening of hyperlipidemia, renal function and hepatic function.  Medication reconciliation,  past medical history, social history, problem list and allergies were reviewed in detail with the patient  Goals were established with regard to weight loss, exercise, and  diet in compliance with medications  She is due for routine colon cancer screening-per new guidelines.  She is up-to-date on GYN care  Review of Systems  Constitutional: Negative.   HENT: Negative.   Eyes: Negative.   Respiratory: Negative.   Cardiovascular: Negative.   Gastrointestinal: Positive for constipation and diarrhea.  Endocrine: Negative.   Genitourinary: Positive for dysuria and frequency.  Musculoskeletal: Negative.   Skin: Negative.   Allergic/Immunologic: Negative.   Neurological: Negative.   Hematological: Negative.   Psychiatric/Behavioral: Positive for sleep disturbance.   Past Medical History:  Diagnosis Date  . Anxiety   . Asthma   . Chicken pox   . Complication of anesthesia    slow to wake up from anesthesia  . DJD (degenerative joint disease)   . Genital warts   . GERD (gastroesophageal reflux disease)    resolved after gastric surgery  . H/O gastric bypass 2016  . History of iron deficiency anemia   . HSV infection   . Migraines   . Obese   . Pneumonia  Childhood history  . PONV (postoperative nausea and vomiting)   . Varicose vein of leg     Social History   Socioeconomic History  . Marital status: Legally Separated    Spouse name: Not on file  . Number of children: Not on file  . Years of education: Not on file  . Highest education level: Not on file   Occupational History  . Not on file  Tobacco Use  . Smoking status: Never Smoker  . Smokeless tobacco: Never Used  Vaping Use  . Vaping Use: Never used  Substance and Sexual Activity  . Alcohol use: Yes    Comment: Social/Rare  . Drug use: Never  . Sexual activity: Yes    Birth control/protection: Surgical  Other Topics Concern  . Not on file  Social History Narrative  . Not on file   Social Determinants of Health   Financial Resource Strain: Not on file  Food Insecurity: Not on file  Transportation Needs: Not on file  Physical Activity: Not on file  Stress: Not on file  Social Connections: Not on file  Intimate Partner Violence: Not on file    Past Surgical History:  Procedure Laterality Date  . ANKLE FRACTURE SURGERY Left 03/08/2009  . BREAST SURGERY Bilateral    Cyst Removal  . GASTRIC BYPASS  06/18/2015  . LAPAROSCOPIC VAGINAL HYSTERECTOMY WITH SALPINGECTOMY Bilateral 05/15/2020   Procedure: LAPAROSCOPIC ASSISTED VAGINAL HYSTERECTOMY WITH SALPINGECTOMY;  Surgeon: Louretta Shorten, MD;  Location: [facility];  Service: Gynecology;  Laterality: Bilateral;  need bed  . LASER ABLATION     Varicose veins  . TONSILLECTOMY AND ADENOIDECTOMY  2001  . TUBAL LIGATION  12/19/1999  . UPPER GI ENDOSCOPY    . Uterine ablation  12/2018    Family History  Problem Relation Age of Onset  . COPD Mother   . Heart disease Mother   . Alcohol abuse Mother   . Depression Mother   . Drug abuse Mother   . High Cholesterol Mother   . High blood pressure Mother   . COPD Father   . Aneurysm Father   . Heart disease Father   . Early death Father   . Diabetes Father   . Drug abuse Father   . AAA (abdominal aortic aneurysm) Father        cause of death at 72  . Arthritis Sister   . Kidney disease Sister   . Asthma Neg Hx   . Allergic rhinitis Neg Hx     Allergies  Allergen Reactions  . Cyclobenzaprine Shortness Of Breath and Other (See Comments)    Relaxed  tongue   . Shellfish Allergy Shortness Of Breath    Shortness of breath (SHRIMP ONLY)  . Sulfa Antibiotics Anaphylaxis, Hives and Swelling  . Carisoprodol Hives  . Morphine Other (See Comments)    Feels like her head is going to "blow up"    . Adhesive [Tape] Rash    Other reaction(s): SKIN - rash (skin burns) PAPER TAPE IS OK     Current Outpatient Medications on File Prior to Visit  Medication Sig Dispense Refill  . acetaminophen (TYLENOL) 500 MG tablet Take by mouth.    Marland Kitchen albuterol (VENTOLIN HFA) 108 (90 Base) MCG/ACT inhaler INHALE 1-2 PUFFS INTO THE LUNGS EVERY 6 HOURS AS NEEDED FOR WHEEZING OR SHORTNESS OF BREATH. 54 g 0  . Azelastine-Fluticasone 137-50 MCG/ACT SUSP Place 1 spray into both nostrils 2 (two) times daily as needed. 69 g  1  . CALCIUM PO Take 1 tablet by mouth 4 (four) times a week.    . cetirizine (ZYRTEC) 10 MG tablet Take 2 tablets (20 mg total) by mouth in the morning and at bedtime. 360 tablet 1  . Cyanocobalamin (NASCOBAL NA) Place 1 spray into the nose every Sunday.    . diazepam (VALIUM) 10 MG tablet TAKE 1 TABLET BY MOUTH NIGHTLY AS NEEDED FOR SLEEP. 30 tablet 2  . fluconazole (DIFLUCAN) 150 MG tablet Take 1 tablet (150 mg total) by mouth daily. 14 tablet 3  . fluticasone-salmeterol (ADVAIR HFA) 230-21 MCG/ACT inhaler Inhale 2 puffs into the lungs 2 (two) times daily. 36 g 1  . Fluticasone-Umeclidin-Vilant (TRELEGY ELLIPTA) 200-62.5-25 MCG/INH AEPB Inhale 1 puff into the lungs daily. 90 each 1  . hydrocortisone 2.5 % ointment Apply 1 application topically in the morning and at bedtime.     . Hypertonic Nasal Wash (SINUS RINSE REFILL) PACK Use one packet dissolved in water as needed. (Patient taking differently: Place 1 each into the nose in the morning and at bedtime.) 100 each 5  . ibuprofen (ADVIL) 800 MG tablet TAKE 1 TABLET (800 MG TOTAL) BY MOUTH 3 (THREE) TIMES DAILY AS NEEDED (PAIN.). 270 tablet 0  . imiquimod (ALDARA) 5 % cream Apply daily x 5 days  PRN 24 each 0  . ipratropium (ATROVENT) 0.06 % nasal spray Place 2 sprays into both nostrils 4 (four) times daily. 45 mL 3  . meclizine (ANTIVERT) 25 MG tablet Take 25 mg by mouth 3 (three) times daily as needed (dizziness/vertigo).     . mirabegron ER (MYRBETRIQ) 50 MG TB24 tablet Take 50 mg by mouth daily.    Marland Kitchen MUCUS RELIEF ER 600 MG 12 hr tablet Take 600-1,200 mg by mouth 2 (two) times daily as needed (congestion).     . Olopatadine HCl 0.2 % SOLN PLACE 1 DROP INTO BOTH EYES TWICE DAILY AS NEEDED. 7.5 mL 3  . omalizumab (XOLAIR) 150 MG injection INJECT 300 MG INTO THE SKIN EVERY 14 DAYS (Patient taking differently: Inject 300 mg into the skin every 14 (fourteen) days.) 4 each 11  . ondansetron (ZOFRAN-ODT) 4 MG disintegrating tablet PLACE 1 TABLET ONTO THE TONGUE EVERY 8 HOURS AS NEEDED FOR NAUSEA. 90 tablet 0  . pantoprazole (PROTONIX) 40 MG tablet Take 1 tablet (40 mg total) by mouth at bedtime. 90 tablet 1  . Pediatric Multiple Vit-C-FA (MULTIVITAMIN ANIMAL SHAPES, WITH CA/FA,) with C & FA chewable tablet Chew 2 tablets by mouth daily.    . simethicone (MYLICON) 80 MG chewable tablet Chew 80 mg by mouth every 6 (six) hours as needed for flatulence.    . SUMAtriptan (IMITREX) 100 MG tablet Take 100 mg by mouth every 2 (two) hours as needed for migraine.     . triamcinolone ointment (KENALOG) 0.1 % APPLY TO AFFECTED AREA(S) 2 TIMES A DAY 454 g 2  . valACYclovir (VALTREX) 500 MG tablet Take 1,000 mg by mouth at bedtime.      Current Facility-Administered Medications on File Prior to Visit  Medication Dose Route Frequency Provider Last Rate Last Admin  . omalizumab Arvid Right) injection 300 mg  300 mg Subcutaneous Q14 Days Valentina Shaggy, MD   300 mg at 01/03/21 1843    BP 110/80 (BP Location: Left Arm, Patient Position: Supine, Cuff Size: Large)   Pulse 87   Temp 98 F (36.7 C) (Oral)   Ht 5' 6.75" (1.695 m)   Wt 189 lb (85.7  kg)   SpO2 97%   BMI 29.82 kg/m       Objective:    Physical Exam Vitals and nursing note reviewed.  Constitutional:      General: She is not in acute distress.    Appearance: Normal appearance. She is well-developed. She is not ill-appearing.  HENT:     Head: Normocephalic and atraumatic.     Right Ear: Tympanic membrane, ear canal and external ear normal. There is no impacted cerumen.     Left Ear: Tympanic membrane, ear canal and external ear normal. There is no impacted cerumen.     Nose: Nose normal. No congestion or rhinorrhea.     Mouth/Throat:     Mouth: Mucous membranes are moist.     Pharynx: Oropharynx is clear. No oropharyngeal exudate or posterior oropharyngeal erythema.  Eyes:     General:        Right eye: No discharge.        Left eye: No discharge.     Extraocular Movements: Extraocular movements intact.     Conjunctiva/sclera: Conjunctivae normal.     Pupils: Pupils are equal, round, and reactive to light.  Neck:     Thyroid: No thyromegaly.     Vascular: No carotid bruit.     Trachea: No tracheal deviation.  Cardiovascular:     Rate and Rhythm: Normal rate and regular rhythm.     Pulses: Normal pulses.     Heart sounds: Normal heart sounds. No murmur heard. No friction rub. No gallop.   Pulmonary:     Effort: Pulmonary effort is normal. No respiratory distress.     Breath sounds: Normal breath sounds. No stridor. No wheezing, rhonchi or rales.  Chest:     Chest wall: No tenderness.  Abdominal:     General: Abdomen is flat. Bowel sounds are normal. There is no distension.     Palpations: Abdomen is soft. There is no mass.     Tenderness: There is no abdominal tenderness. There is no right CVA tenderness, left CVA tenderness, guarding or rebound.     Hernia: No hernia is present.  Musculoskeletal:        General: No swelling, tenderness, deformity or signs of injury. Normal range of motion.     Cervical back: Normal range of motion and neck supple.     Right lower leg: No edema.     Left lower leg: No edema.   Lymphadenopathy:     Cervical: No cervical adenopathy.  Skin:    General: Skin is warm and dry.     Coloration: Skin is not jaundiced or pale.     Findings: No bruising, erythema, lesion or rash.  Neurological:     General: No focal deficit present.     Mental Status: She is alert and oriented to person, place, and time.     Cranial Nerves: No cranial nerve deficit.     Sensory: No sensory deficit.     Motor: No weakness.     Coordination: Coordination normal.     Gait: Gait normal.     Deep Tendon Reflexes: Reflexes normal.  Psychiatric:        Mood and Affect: Mood normal.        Behavior: Behavior normal.        Thought Content: Thought content normal.        Judgment: Judgment normal.       Assessment & Plan:  1. Routine general medical examination at a  health care facility * - CBC with Differential/Platelet; Future - Comprehensive metabolic panel; Future - Hemoglobin A1c; Future - Lipid panel; Future - TSH; Future - TSH - Lipid panel - Hemoglobin A1c - Comprehensive metabolic panel - CBC with Differential/Platelet  2. Gastroesophageal reflux disease without esophagitis  - pantoprazole (PROTONIX) 40 MG tablet; Take 1 tablet (40 mg total) by mouth at bedtime.  Dispense: 90 tablet; Refill: 3  3. H/O gastric bypass - Will refer to bariatric surgery  - CBC with Differential/Platelet; Future - Comprehensive metabolic panel; Future - Hemoglobin A1c; Future - Lipid panel; Future - TSH; Future - IBC + Ferritin; Future - Vitamin D, 25-hydroxy; Future - PTH, Intact and Calcium; Future - Vitamin B12; Future - Vitamin B1; Future - Vitamin A; Future - Vitamin A - Vitamin B1 - Vitamin B12 - PTH, Intact and Calcium - Vitamin D, 25-hydroxy - IBC + Ferritin - TSH - Lipid panel - Hemoglobin A1c - Comprehensive metabolic panel - CBC with Differential/Platelet - Amb Referral to Bariatric Surgery  4. Severe persistent asthma without complication - Follow up with  Allergy and Asthma as directed  5. Genital warts  - valACYclovir (VALTREX) 500 MG tablet; Take 2 tablets (1,000 mg total) by mouth at bedtime.  Dispense: 180 tablet; Refill: 3  6. Intractable migraine without status migrainosus, unspecified migraine type  - SUMAtriptan (IMITREX) 100 MG tablet; Take 1 tablet (100 mg total) by mouth every 2 (two) hours as needed for migraine.  Dispense: 10 tablet; Refill: 3 - ibuprofen (ADVIL) 800 MG tablet; Take 1 tablet (800 mg total) by mouth 3 (three) times daily as needed (pain.).  Dispense: 270 tablet; Refill: 0  7. Colon cancer screening  - Ambulatory referral to Gastroenterology  8. Dysuria  - POCT urinalysis dipstick - Urine Culture; Future  9. Vitamin D deficiency - Consider dose change   10. Primary insomnia - d/c valium. Trial her on Trazodone  - traZODone (DESYREL) 50 MG tablet; Take 0.5-1 tablets (25-50 mg total) by mouth at bedtime as needed for sleep.  Dispense: 90 tablet; Refill: 0  11. Constipation, unspecified constipation type - Will have her trial Benifiber daily and can increase to BID if needed - Can consider Linzess   Dorothyann Peng, NP

## 2021-02-08 NOTE — Patient Instructions (Signed)
It was great seeing you today   We will follow up with you on your labs   Someone form GI will call you to schedule as well as someone from Bariatric surgery   Please start using Benifiber

## 2021-02-09 ENCOUNTER — Encounter: Payer: Self-pay | Admitting: Adult Health

## 2021-02-09 LAB — URINE CULTURE
MICRO NUMBER:: 11608777
Result:: NO GROWTH
SPECIMEN QUALITY:: ADEQUATE

## 2021-02-12 ENCOUNTER — Other Ambulatory Visit (HOSPITAL_COMMUNITY): Payer: Self-pay | Admitting: Adult Health

## 2021-02-12 ENCOUNTER — Other Ambulatory Visit: Payer: Self-pay | Admitting: Adult Health

## 2021-02-12 ENCOUNTER — Encounter: Payer: Self-pay | Admitting: Adult Health

## 2021-02-12 DIAGNOSIS — G4700 Insomnia, unspecified: Secondary | ICD-10-CM

## 2021-02-12 MED ORDER — AMITRIPTYLINE HCL 50 MG PO TABS: 50.0000 mg | ORAL_TABLET | Freq: Every day | ORAL | 0 refills | Status: DC

## 2021-02-12 MED ORDER — VITAMIN D (ERGOCALCIFEROL) 1.25 MG (50000 UNIT) PO CAPS: 50000.0000 [IU] | ORAL_CAPSULE | ORAL | 3 refills | Status: AC

## 2021-02-12 MED FILL — AMITRIPTYLINE HCL 50 MG TAB: 50 | 30 days supply | Qty: 30 | Fill #0

## 2021-02-12 MED FILL — VIT D2 1.25 MG (50,000 UNIT: 1.25 MG | 84 days supply | Qty: 12 | Fill #0

## 2021-02-13 LAB — TEST AUTHORIZATION

## 2021-02-13 LAB — VITAMIN B1: Vitamin B1 (Thiamine): 33 nmol/L — ABNORMAL HIGH (ref 8–30)

## 2021-02-13 LAB — PTH, INTACT AND CALCIUM
Calcium: 9 mg/dL (ref 8.6–10.2)
PTH: 64 pg/mL (ref 14–64)

## 2021-02-13 LAB — VITAMIN A: Vitamin A (Retinoic Acid): 38 ug/dL (ref 38–98)

## 2021-02-13 LAB — HIV ANTIBODY (ROUTINE TESTING W REFLEX): HIV 1&2 Ab, 4th Generation: NONREACTIVE

## 2021-02-14 ENCOUNTER — Ambulatory Visit (INDEPENDENT_AMBULATORY_CARE_PROVIDER_SITE_OTHER): Payer: 59

## 2021-02-14 DIAGNOSIS — J4550 Severe persistent asthma, uncomplicated: Secondary | ICD-10-CM

## 2021-02-14 MED ORDER — AMBULATORY NON FORMULARY MEDICATION: 210.0000 mg | 0 refills | Status: DC

## 2021-02-14 NOTE — Addendum Note (Signed)
Addended by: Mellody Memos on: 02/14/2021 07:02 PM   Modules accepted: Orders

## 2021-02-14 NOTE — Progress Notes (Signed)
Immunotherapy   Patient Details  Name: Kasey Hansell MRN: [id number] Date of Birth: 27-Oct-1971  02/14/2021  Ignacia Felling Arakawa received Tezspire 210 mg/ 1.91 mL today. Lot # [id number] Exp.08/08/23     Denece Shearer J Selso Mannor 02/14/2021, 7:00 PM

## 2021-02-15 ENCOUNTER — Other Ambulatory Visit (HOSPITAL_COMMUNITY): Payer: Self-pay | Admitting: Radiology

## 2021-02-15 DIAGNOSIS — R61 Generalized hyperhidrosis: Secondary | ICD-10-CM | POA: Diagnosis not present

## 2021-02-15 DIAGNOSIS — N952 Postmenopausal atrophic vaginitis: Secondary | ICD-10-CM | POA: Diagnosis not present

## 2021-02-15 DIAGNOSIS — N3281 Overactive bladder: Secondary | ICD-10-CM | POA: Diagnosis not present

## 2021-02-15 DIAGNOSIS — N760 Acute vaginitis: Secondary | ICD-10-CM | POA: Diagnosis not present

## 2021-02-15 DIAGNOSIS — Z113 Encounter for screening for infections with a predominantly sexual mode of transmission: Secondary | ICD-10-CM | POA: Diagnosis not present

## 2021-02-15 MED FILL — TINIDAZOLE 500 MG TAB: 500 | 5 days supply | Qty: 10 | Fill #0

## 2021-02-18 ENCOUNTER — Other Ambulatory Visit: Payer: Self-pay | Admitting: Internal Medicine

## 2021-02-19 ENCOUNTER — Other Ambulatory Visit: Payer: Self-pay | Admitting: Allergy & Immunology

## 2021-03-01 ENCOUNTER — Other Ambulatory Visit (HOSPITAL_COMMUNITY): Payer: Self-pay | Admitting: Pharmacist

## 2021-03-01 MED FILL — VALACYCLOVIR HCL 500 MG TAB: 500 | 30 days supply | Qty: 60 | Fill #0

## 2021-03-01 MED FILL — CARESTART COVID-19 HOME TES: 4 days supply | Qty: 4 | Fill #0

## 2021-03-07 ENCOUNTER — Other Ambulatory Visit: Payer: Self-pay

## 2021-03-07 ENCOUNTER — Other Ambulatory Visit (HOSPITAL_COMMUNITY): Payer: Self-pay

## 2021-03-07 ENCOUNTER — Ambulatory Visit (INDEPENDENT_AMBULATORY_CARE_PROVIDER_SITE_OTHER): Payer: 59 | Admitting: *Deleted

## 2021-03-07 DIAGNOSIS — J4550 Severe persistent asthma, uncomplicated: Secondary | ICD-10-CM | POA: Diagnosis not present

## 2021-03-07 DIAGNOSIS — J4540 Moderate persistent asthma, uncomplicated: Secondary | ICD-10-CM

## 2021-03-07 NOTE — Progress Notes (Signed)
Immunotherapy   Patient Details  Name: Donna Park MRN: [id number] Date of Birth: Feb 21, 1971  03/07/2021  Patient received 1.38mL injection of Tezspire int he RUA and left without waiting.  Lot [id number] EXP 08/08/2023  Chip Boer 03/07/2021, 6:34 PM

## 2021-03-14 ENCOUNTER — Ambulatory Visit: Payer: 59

## 2021-03-18 ENCOUNTER — Telehealth: Payer: Self-pay | Admitting: *Deleted

## 2021-03-18 MED ORDER — TEZSPIRE 210 MG/1.91ML ~~LOC~~ SOSY
210.0000 mg | PREFILLED_SYRINGE | SUBCUTANEOUS | 11 refills | Status: DC
  Filled 2021-03-18 – 2021-04-05 (×3): qty 1.91, 28d supply, fill #0

## 2021-03-18 NOTE — Telephone Encounter (Signed)
Spoke to patient and advised [facility] now has Tezspire and will send Erx to them but patient needs to contact Tezspire together to get her copay card info to give pharmacy to get $0 copay.

## 2021-03-19 ENCOUNTER — Other Ambulatory Visit (HOSPITAL_COMMUNITY): Payer: Self-pay

## 2021-03-20 ENCOUNTER — Other Ambulatory Visit (HOSPITAL_COMMUNITY): Payer: Self-pay

## 2021-03-20 ENCOUNTER — Telehealth: Payer: Self-pay | Admitting: Pharmacist

## 2021-03-20 NOTE — Telephone Encounter (Signed)
Called patient to schedule an appointment for the [employer] [facility]. I was unable to reach the patient so I left a HIPAA-compliant message requesting that the patient return my call.   Luke Van Ausdall, PharmD, BCACP, CPP Clinical Pharmacist [facility] [phone]  

## 2021-03-21 ENCOUNTER — Other Ambulatory Visit: Payer: Self-pay

## 2021-03-21 IMAGING — CT CT ABD-PELV W/ CM
2 of 5 series · 16 of 46 positions shown, 18 images · IV contrast (Omnipaque)
Comparison: None

CLINICAL DATA: Pelvic pain, negative beta HCG.

EXAM:
CT ABDOMEN AND PELVIS WITH CONTRAST
TECHNIQUE: Multidetector CT imaging of the abdomen and pelvis was performed
using the standard protocol following bolus administration of
intravenous contrast.
CONTRAST:  100mL OMNIPAQUE IOHEXOL 300 MG/ML  SOLN

[Series 2: axial st · axial · 0.81mm/px · z∈[-496,-76]mm · 13 of 98 slices shown, 15 images]
[im 7/98  soft-tissue]
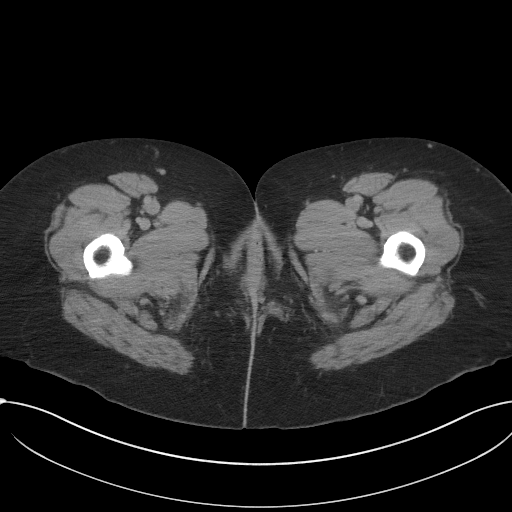
[im 7/98  bone]
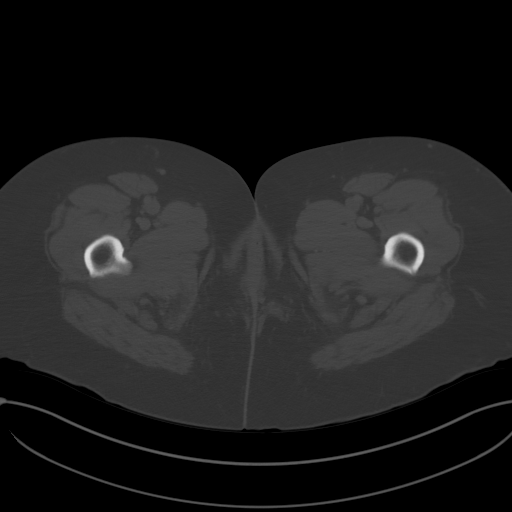
[im 13/98  soft-tissue]
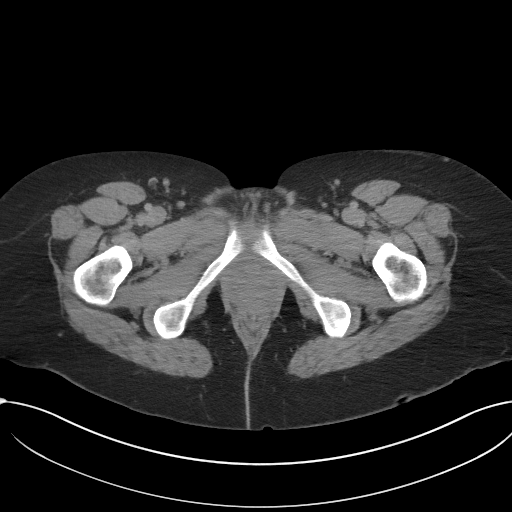
[im 19/98  soft-tissue]
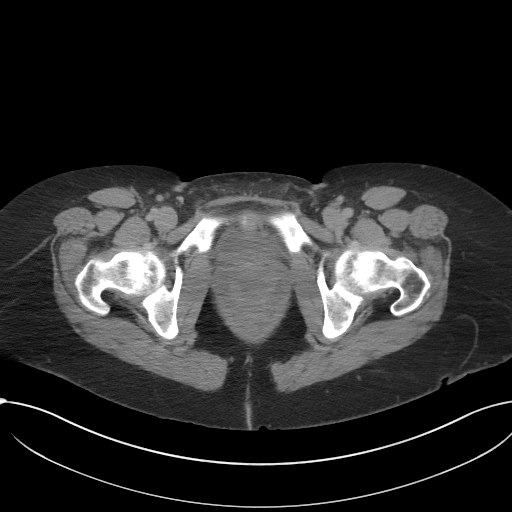
[im 31/98  soft-tissue]
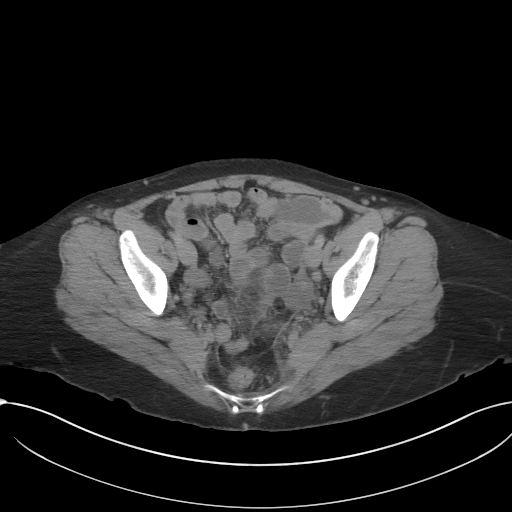
[im 37/98  soft-tissue]
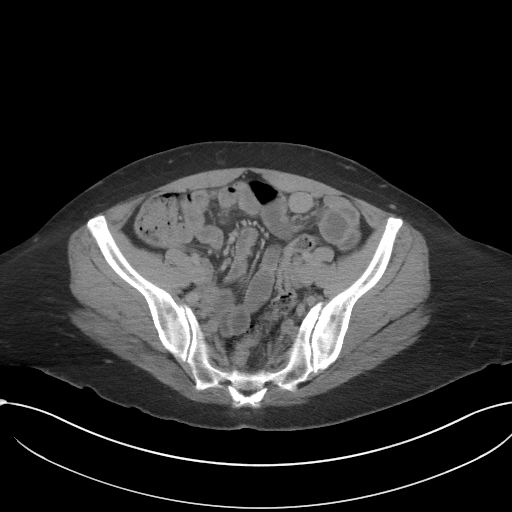
[im 43/98  soft-tissue]
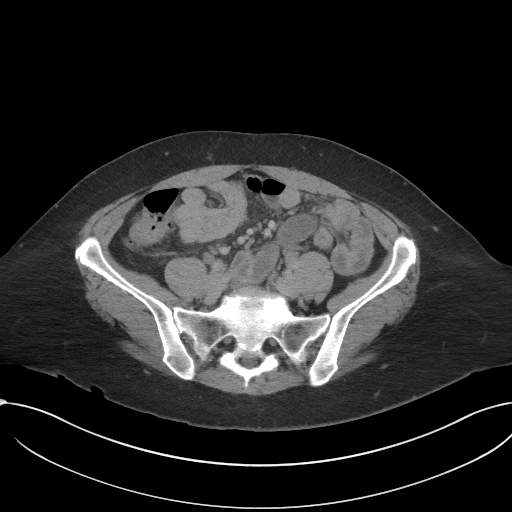
[im 49/98  soft-tissue]
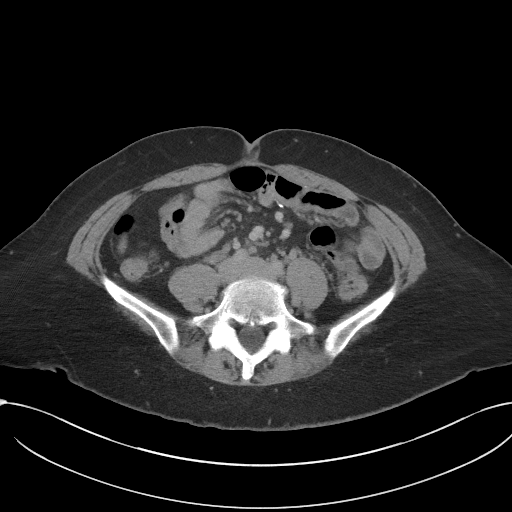
[im 55/98  soft-tissue]
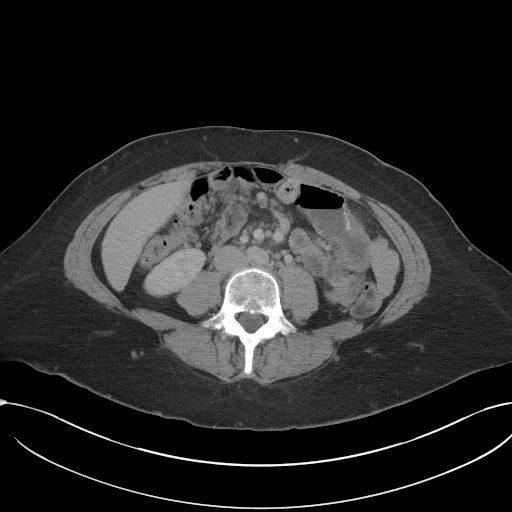
[im 61/98  soft-tissue]
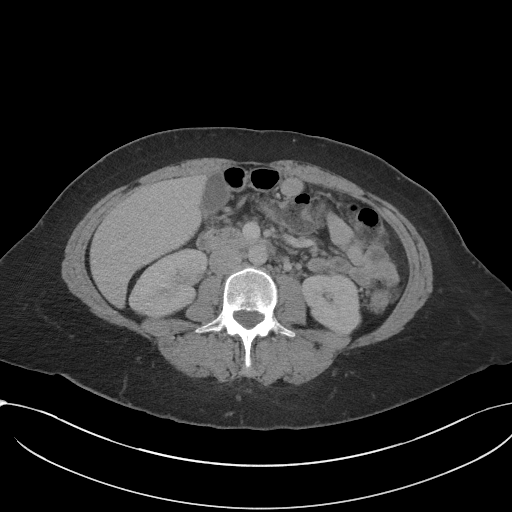
[im 61/98  bone]
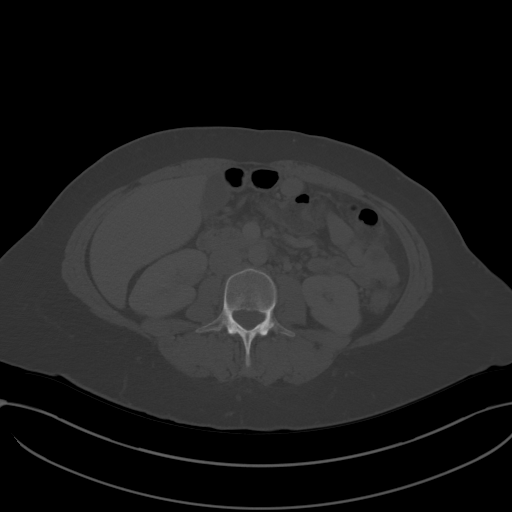
[im 67/98  soft-tissue]
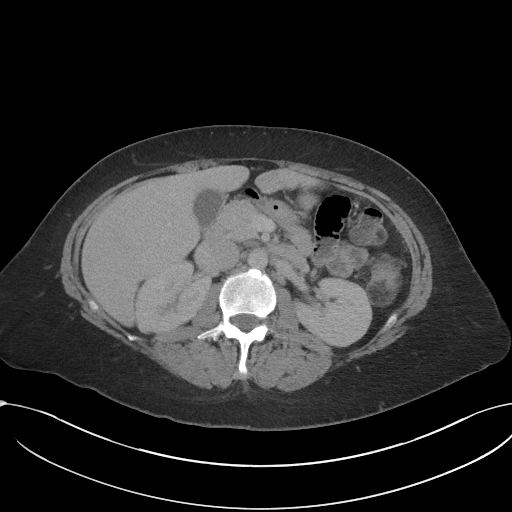
[im 79/98  soft-tissue]
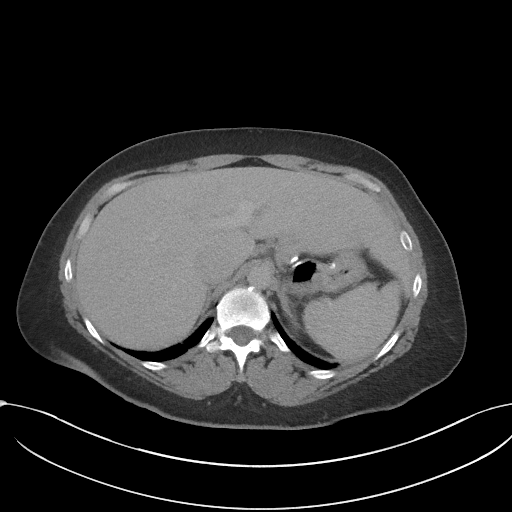
[im 85/98  soft-tissue]
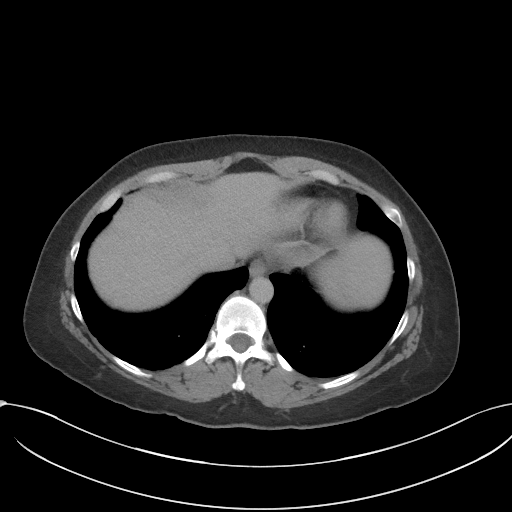
[im 91/98  soft-tissue]
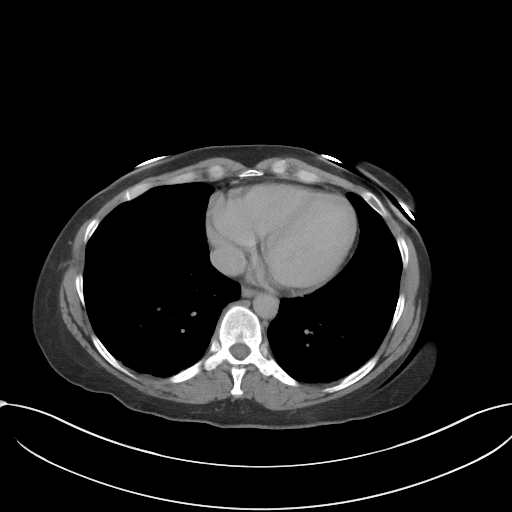

[Series 5: coronal st · coronal · 0.96mm/px · 3 of 84 slices shown]
[im 28/84  soft-tissue]
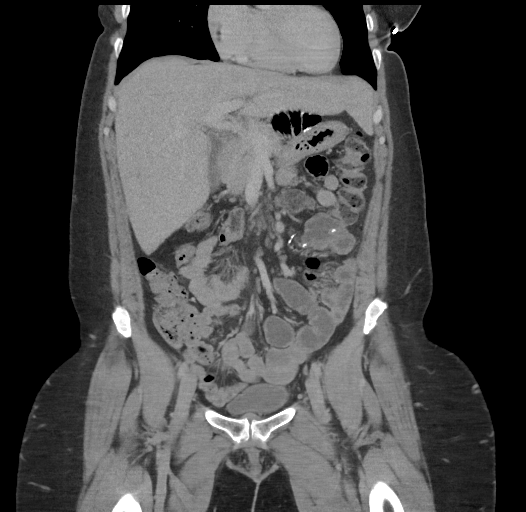
[im 37/84  soft-tissue]
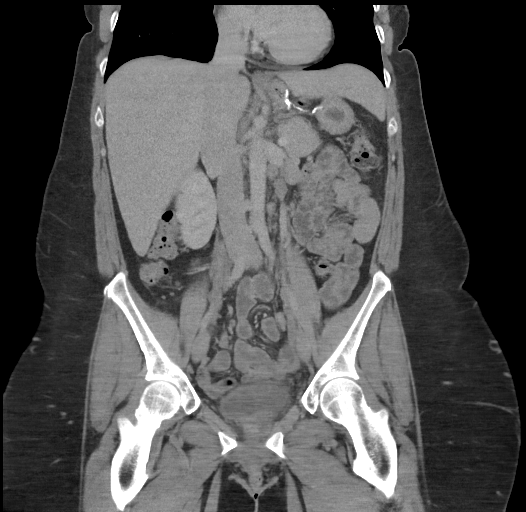
[im 47/84  soft-tissue]
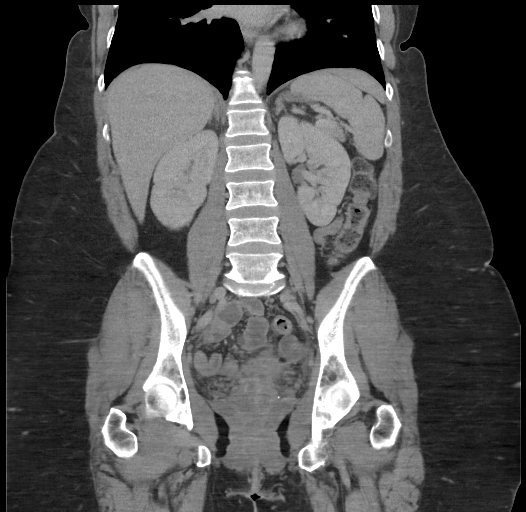

[16 of 46 positions shown; findings below may reference images not displayed]

FINDINGS: Lower chest: Basilar atelectasis. No consolidation. No pleural
effusion.

Hepatobiliary: Lobular hepatic contours. No focal, suspicious
hepatic lesion. No pericholecystic stranding. No biliary duct
dilation.

Pancreas: Pancreas without ductal dilation, inflammation or focal
lesion.

Spleen: Spleen normal in size and contour.

Adrenals/Urinary Tract: Adrenal glands are normal.

Symmetric renal enhancement without signs of hydronephrosis. Urinary
bladder with some perivesical stranding in the setting of marked
vaginal stranding, see below.

Stomach/Bowel: Post gastric bypass with Roux-en-Y. Jejunojejunostomy
mildly patulous in the LEFT upper quadrant. No signs of bowel
obstruction.

Perienteric stranding in the pelvis adjacent to the vaginal apex
with marked thickening of the vagina. The appendix is normal.

Mild pericolonic stranding also more pronounced adjacent to small
bowel and vagina.

Vascular/Lymphatic: No aneurysmal dilation. No significant
atherosclerotic changes. No adenopathy but with numerous small
scattered lymph nodes throughout the retroperitoneum.

No pelvic lymphadenopathy.

Reproductive: Marked stranding about and thickening of the vagina.
Stranding about adjacent small bowel loops and urinary bladder. Tiny
locules of gas are seen along the RIGHT lateral margin of small
bowel loops in the pelvis. There is no pelvic abscess.

Bilateral ovaries remain in place grossly normal by CT.

Other: Tiny locules of gas near the vaginal apex and adjacent to
small bowel loops without definitive signs of free intraperitoneal
air. No sign of abscess. Extensive inflammation in the pelvis. No
gas in the urinary bladder. Small fat containing umbilical hernia.

Musculoskeletal: No acute musculoskeletal process or destructive
bone finding.
IMPRESSION: 1. Marked stranding about the vaginal apex with marked thickening of
the vagina. Tiny locules of gas near the vaginal apex and adjacent
to small bowel loops without definitive signs of free
intraperitoneal air. Findings compatible with diffuse vaginal
inflammation. Given appearance, dehiscence of the vaginal cuff is
considered with extensive secondary inflammation of small bowel. No
sign of abscess but with small locules of gas adjacent to the
vaginal apex and small bowel loops.
2. Lobular hepatic contours, correlate with any clinical or
laboratory evidence of liver disease.
3. Post gastric bypass with Roux-en-Y. No signs of bowel
obstruction.
4. Small fat containing umbilical hernia.

## 2021-03-22 ENCOUNTER — Other Ambulatory Visit (HOSPITAL_COMMUNITY): Payer: Self-pay

## 2021-03-25 ENCOUNTER — Other Ambulatory Visit: Payer: Self-pay

## 2021-03-25 ENCOUNTER — Other Ambulatory Visit (HOSPITAL_COMMUNITY): Payer: Self-pay

## 2021-03-26 ENCOUNTER — Other Ambulatory Visit: Payer: Self-pay

## 2021-03-26 ENCOUNTER — Other Ambulatory Visit (HOSPITAL_COMMUNITY): Payer: Self-pay

## 2021-03-26 MED ORDER — GEMTESA 75 MG PO TABS
1.0000 | ORAL_TABLET | Freq: Every day | ORAL | 11 refills | Status: DC
  Filled 2021-03-26: qty 30, 30d supply, fill #0
  Filled 2021-04-19: qty 30, 30d supply, fill #1
  Filled 2021-05-28: qty 30, 30d supply, fill #2
  Filled 2021-07-01: qty 30, 30d supply, fill #3
  Filled 2021-07-16: qty 30, 30d supply, fill #4
  Filled 2021-07-22: qty 90, 90d supply, fill #4
  Filled 2021-10-24 – 2021-11-05 (×2): qty 90, 90d supply, fill #5

## 2021-03-27 ENCOUNTER — Other Ambulatory Visit (HOSPITAL_COMMUNITY): Payer: Self-pay

## 2021-03-29 DIAGNOSIS — Z1231 Encounter for screening mammogram for malignant neoplasm of breast: Secondary | ICD-10-CM | POA: Diagnosis not present

## 2021-04-01 ENCOUNTER — Other Ambulatory Visit (HOSPITAL_COMMUNITY): Payer: Self-pay

## 2021-04-05 ENCOUNTER — Other Ambulatory Visit (HOSPITAL_COMMUNITY): Payer: Self-pay

## 2021-04-05 ENCOUNTER — Ambulatory Visit: Payer: 59 | Attending: Adult Health | Admitting: Pharmacist

## 2021-04-05 DIAGNOSIS — Z79899 Other long term (current) drug therapy: Secondary | ICD-10-CM

## 2021-04-05 MED ORDER — TEZSPIRE 210 MG/1.91ML ~~LOC~~ SOSY
210.0000 mg | PREFILLED_SYRINGE | SUBCUTANEOUS | 11 refills | Status: DC
  Filled 2021-04-05 (×3): qty 1.91, 28d supply, fill #0
  Filled 2021-05-03: qty 1.91, 28d supply, fill #1
  Filled 2021-05-31 – 2021-06-12 (×2): qty 1.91, 28d supply, fill #2
  Filled 2021-07-23: qty 1.91, 28d supply, fill #3
  Filled 2021-08-29: qty 1.91, 28d supply, fill #4
  Filled 2021-10-14: qty 1.91, 28d supply, fill #5
  Filled 2021-11-08: qty 1.91, 28d supply, fill #6
  Filled 2021-12-11: qty 1.91, 28d supply, fill #7
  Filled 2022-01-06: qty 1.91, 28d supply, fill #8
  Filled 2022-02-14: qty 1.91, 28d supply, fill #9
  Filled 2022-03-14: qty 1.91, 28d supply, fill #10

## 2021-04-05 NOTE — Progress Notes (Signed)
   S: Patient presents today for review of their specialty medication.   Patient is currently taking Tezspire for asthma. Patient is managed by Dr. Ernst Bowler for this.   Dosing: 210 mg subq q28days  Adherence: confirms. Has been taking for several months now.   Efficacy: pt states that she feels like them medication is working   Monitoring:  S/Sx of hypersensitivity: none  PFTs: monitored by Dr. Lyda Perone office  S/sx of infection: none   Current adverse effects: none reported   O:     Lab Results  Component Value Date   WBC 3.8 (L) 02/08/2021   HGB 12.7 02/08/2021   HCT 37.8 02/08/2021   MCV 91.9 02/08/2021   PLT 174.0 02/08/2021      Chemistry      Component Value Date/Time   NA 139 02/08/2021 0748   NA 140 06/29/2019 1055   K 3.9 02/08/2021 0748   CL 106 02/08/2021 0748   CO2 29 02/08/2021 0748   BUN 14 02/08/2021 0748   BUN 11 06/29/2019 1055   CREATININE 0.65 02/08/2021 0748      Component Value Date/Time   CALCIUM 9.0 02/08/2021 0748   CALCIUM 8.7 02/08/2021 0748   ALKPHOS 46 02/08/2021 0748   AST 24 02/08/2021 0748   ALT 25 02/08/2021 0748   BILITOT 0.5 02/08/2021 0748   BILITOT 0.5 06/29/2019 1055       A/P: 1. Medication review: patient currently on Tezspire for asthma. She is tolerating this well with reported efficacy. I reviewed the medication with her. She has received confirmation of reimbursement aids from the drug manufacturer of Tezspire. I will pass this information along to the pharmacy. No recommendations for any changes at this time.   Benard Halsted, PharmD, [facility], [place] [phone]

## 2021-04-06 ENCOUNTER — Other Ambulatory Visit (HOSPITAL_COMMUNITY): Payer: Self-pay

## 2021-04-08 ENCOUNTER — Other Ambulatory Visit (HOSPITAL_COMMUNITY): Payer: Self-pay

## 2021-04-11 ENCOUNTER — Ambulatory Visit (INDEPENDENT_AMBULATORY_CARE_PROVIDER_SITE_OTHER): Payer: 59

## 2021-04-11 ENCOUNTER — Other Ambulatory Visit: Payer: Self-pay

## 2021-04-11 DIAGNOSIS — J4540 Moderate persistent asthma, uncomplicated: Secondary | ICD-10-CM | POA: Diagnosis not present

## 2021-04-11 MED ORDER — TEZEPELUMAB-EKKO 210 MG/1.91ML ~~LOC~~ SOSY
210.0000 mg | PREFILLED_SYRINGE | SUBCUTANEOUS | Status: AC
  Administered 2021-04-11 – 2024-12-16 (×42): 210 mg via SUBCUTANEOUS

## 2021-04-19 ENCOUNTER — Other Ambulatory Visit (HOSPITAL_COMMUNITY): Payer: Self-pay

## 2021-04-19 MED FILL — Valacyclovir HCl Tab 500 MG: ORAL | 90 days supply | Qty: 180 | Fill #0 | Status: AC

## 2021-04-22 ENCOUNTER — Other Ambulatory Visit (HOSPITAL_COMMUNITY): Payer: Self-pay

## 2021-04-24 ENCOUNTER — Other Ambulatory Visit (HOSPITAL_COMMUNITY): Payer: Self-pay

## 2021-05-03 ENCOUNTER — Other Ambulatory Visit (HOSPITAL_COMMUNITY): Payer: Self-pay

## 2021-05-07 ENCOUNTER — Other Ambulatory Visit (HOSPITAL_COMMUNITY): Payer: Self-pay

## 2021-05-08 ENCOUNTER — Other Ambulatory Visit (HOSPITAL_COMMUNITY): Payer: Self-pay

## 2021-05-09 ENCOUNTER — Other Ambulatory Visit: Payer: Self-pay

## 2021-05-09 ENCOUNTER — Ambulatory Visit (INDEPENDENT_AMBULATORY_CARE_PROVIDER_SITE_OTHER): Payer: 59 | Admitting: *Deleted

## 2021-05-09 DIAGNOSIS — J4550 Severe persistent asthma, uncomplicated: Secondary | ICD-10-CM | POA: Diagnosis not present

## 2021-05-09 DIAGNOSIS — J4540 Moderate persistent asthma, uncomplicated: Secondary | ICD-10-CM

## 2021-05-28 ENCOUNTER — Other Ambulatory Visit (HOSPITAL_COMMUNITY): Payer: Self-pay

## 2021-05-28 MED FILL — Ibuprofen Tab 800 MG: ORAL | 90 days supply | Qty: 270 | Fill #0 | Status: AC

## 2021-05-28 MED FILL — Fluconazole Tab 150 MG: ORAL | 14 days supply | Qty: 14 | Fill #0 | Status: AC

## 2021-05-28 MED FILL — Valacyclovir HCl Tab 500 MG: ORAL | 90 days supply | Qty: 180 | Fill #1 | Status: CN

## 2021-05-28 MED FILL — Ergocalciferol Cap 1.25 MG (50000 Unit): ORAL | 84 days supply | Qty: 12 | Fill #0 | Status: AC

## 2021-05-30 ENCOUNTER — Other Ambulatory Visit (HOSPITAL_COMMUNITY): Payer: Self-pay

## 2021-05-31 ENCOUNTER — Other Ambulatory Visit (HOSPITAL_COMMUNITY): Payer: Self-pay

## 2021-06-05 ENCOUNTER — Other Ambulatory Visit (HOSPITAL_COMMUNITY): Payer: Self-pay

## 2021-06-06 ENCOUNTER — Other Ambulatory Visit: Payer: Self-pay

## 2021-06-06 ENCOUNTER — Ambulatory Visit (INDEPENDENT_AMBULATORY_CARE_PROVIDER_SITE_OTHER): Payer: 59 | Admitting: *Deleted

## 2021-06-06 DIAGNOSIS — J4540 Moderate persistent asthma, uncomplicated: Secondary | ICD-10-CM | POA: Diagnosis not present

## 2021-06-11 ENCOUNTER — Other Ambulatory Visit (HOSPITAL_COMMUNITY): Payer: Self-pay

## 2021-06-12 ENCOUNTER — Other Ambulatory Visit (HOSPITAL_COMMUNITY): Payer: Self-pay

## 2021-06-12 ENCOUNTER — Other Ambulatory Visit (INDEPENDENT_AMBULATORY_CARE_PROVIDER_SITE_OTHER): Payer: Self-pay

## 2021-06-14 ENCOUNTER — Other Ambulatory Visit (HOSPITAL_COMMUNITY): Payer: Self-pay

## 2021-06-14 ENCOUNTER — Ambulatory Visit (INDEPENDENT_AMBULATORY_CARE_PROVIDER_SITE_OTHER): Payer: 59 | Admitting: Neurology

## 2021-06-14 VITALS — BP 115/80 | HR 74 | Ht 66.0 in | Wt 188.0 lb

## 2021-06-14 DIAGNOSIS — H539 Unspecified visual disturbance: Secondary | ICD-10-CM

## 2021-06-14 DIAGNOSIS — R51 Headache with orthostatic component, not elsewhere classified: Secondary | ICD-10-CM

## 2021-06-14 DIAGNOSIS — R519 Headache, unspecified: Secondary | ICD-10-CM

## 2021-06-14 DIAGNOSIS — G43109 Migraine with aura, not intractable, without status migrainosus: Secondary | ICD-10-CM | POA: Diagnosis not present

## 2021-06-14 DIAGNOSIS — G43709 Chronic migraine without aura, not intractable, without status migrainosus: Secondary | ICD-10-CM | POA: Diagnosis not present

## 2021-06-14 MED ORDER — ONDANSETRON 4 MG PO TBDP
4.0000 mg | ORAL_TABLET | Freq: Three times a day (TID) | ORAL | 3 refills | Status: DC | PRN
  Filled 2021-06-14 – 2021-08-23 (×2): qty 30, 5d supply, fill #0
  Filled 2022-04-03: qty 30, 5d supply, fill #1

## 2021-06-14 MED ORDER — TOPIRAMATE 50 MG PO TABS
50.0000 mg | ORAL_TABLET | Freq: Every day | ORAL | 6 refills | Status: DC
  Filled 2021-06-14: qty 30, 30d supply, fill #0

## 2021-06-14 MED ORDER — EMGALITY 120 MG/ML ~~LOC~~ SOAJ
120.0000 mg | SUBCUTANEOUS | 11 refills | Status: DC
  Filled 2021-06-14: qty 1, 28d supply, fill #0
  Filled 2021-07-01: qty 1, 30d supply, fill #0
  Filled 2021-07-16: qty 1, 30d supply, fill #1
  Filled 2021-07-22: qty 3, 90d supply, fill #1
  Filled 2021-11-08: qty 3, 90d supply, fill #2
  Filled 2022-03-28 – 2022-05-01 (×4): qty 3, 90d supply, fill #3

## 2021-06-14 MED ORDER — RIZATRIPTAN BENZOATE 10 MG PO TBDP
10.0000 mg | ORAL_TABLET | ORAL | 11 refills | Status: DC | PRN
  Filled 2021-06-14: qty 9, 30d supply, fill #0

## 2021-06-14 NOTE — Patient Instructions (Addendum)
Start Emgality as prevention once monthly injection Start Topiramate at bedtime as prevention Acute/emergent: Stop Imitrex. Try Rizatriptan: Please take one tablet at the onset of your headache. If it does not improve the symptoms please take one additional tablet. Do not take more then 2 tablets in 24hrs. Do not take use more then 2 to 3 times in a week.May take Rizatriptan with ondansetron. Other options for acute/emergency meds include Ubrelvy, Nurtec and many other options MRI brain Try to reduce analgesic use (goodys, ibuprofen, tylenol) to 2 days a week  There is increased risk for stroke in women with migraine with aura and a contraindication for the combined contraceptive pill for use by women who have migraine with aura. The risk for women with migraine without aura is lower. However other risk factors like smoking are far more likely to increase stroke risk than migraine. There is a recommendation for no smoking and for the use of OCPs without estrogen such as progestogen only pills particularly for women with migraine with aura.Marland Kitchen People who have migraine headaches with auras may be 3 times more likely to have a stroke caused by a blood clot, compared to migraine patients who don't see auras. Women who take hormone-replacement therapy may be 30 percent more likely to suffer a clot-based stroke than women not taking medication containing estrogen. Other risk factors like smoking and high blood pressure may be  much more important.    Analgesic Rebound Headache An analgesic rebound headache, sometimes called a medication overuse headache or a drug-induced headache, is a secondary disorder that is caused by the overuse of pain medicine (analgesic) to treat the original (primary) headache. Any type of primary headache can return as a rebound headache if aperson regularly takes analgesics. The types of primary headaches that are commonly associated with rebound headaches  include: Migraines. Headaches that are caused by tense muscles in the head and neck area (tension headaches). Headaches that develop and happen again on one side of the head and around the eye (cluster headaches). If rebound headaches continue, they can become long-term, daily headaches. What are the causes? This condition may be caused by frequent use of: Over-the-counter medicines such as aspirin, ibuprofen, and acetaminophen. Sinus-relief medicines and medicines that contain caffeine. Narcotic pain medicines such as codeine and oxycodone. Some prescription migraine medicines. What are the signs or symptoms? The symptoms of a rebound headache are the same as the symptoms of the originalheadache. Some of the symptoms of specific types of headaches include: Migraine headache Pulsing or throbbing pain on one or both sides of the head. Severe pain that interferes with daily activities. Pain that gets worse with physical activity. Nausea, vomiting, or both. Pain and sensitivity with exposure to bright light, loud noises, or strong smells. Visual changes. Numbness of one or both arms. Tension headache Pressure around the head. Dull, aching head pain. Pain felt over the front and sides of the head. Tenderness in the muscles of the head, neck, and shoulders. Cluster headache Severe pain that begins in or around one eye or temple. Droopy or swollen eyelid, or redness and tearing in the eye on the same side as the pain. One-sided head pain. Nausea. Runny nose. Sweaty, pale facial skin. Restlessness. How is this diagnosed? This condition is diagnosed by: Reviewing your medical history. This includes the nature of your primary headaches. Reviewing the types of pain medicines that you have been using to treat your primary headaches and how often you take them. How is this  treated? This condition may be treated or managed by: Discontinuing frequent use of the analgesic medicine. Doing  this may worsen your headaches at first, but the pain should eventually become more manageable, less frequent, and less severe. Seeing a headache specialist. He or she may be able to help you manage your headaches and help make sure there is not another cause of the headaches. Using methods of stress relief, such as acupuncture, counseling, biofeedback, and massage. Talk with your health care provider about which methods might be good for you. Follow these instructions at home: Medicines  Take over-the-counter and prescription medicines only as told by your health care provider. Stop the repeated use of pain medicine as told by your health care provider. Stopping can be difficult. Carefully follow instructions from your health care provider.  Lifestyle  Follow a regular sleep schedule. Do not vary the time that you go to bed or the amount that you sleep from day to day. It is important to stay on the same schedule to help prevent headaches. Get 7-9 hours of sleep each night, or the amount recommended by your health care provider. Exercise regularly. Exercise for at least 30 minutes, 5 times each week. Limit or manage stress. Consider stress-relief options such as acupuncture, counseling, biofeedback, and massage. Talk with your health care provider about which methods might be good for you. Do not drink alcohol. Do not use any products that contain nicotine or tobacco, such as cigarettes, e-cigarettes, and chewing tobacco. If you need help quitting, ask your health care provider.  General instructions Avoid triggers that are known to cause your primary headaches. Keep all follow-up visits as told by your health care provider. This is important. Contact a health care provider if: You continue to experience headaches after following treatments that your health care provider recommended. Get help right away if you have: New headache pain. Headache pain that is different than what you have  experienced in the past. Numbness or tingling in your arms or legs. Changes in your speech or vision. Summary An analgesic rebound headache, sometimes called a medication overuse headache or a drug-induced headache, is caused by the overuse of pain medicine (analgesic) to treat the original (primary) headache. Any type of primary headache can return as a rebound headache if a person regularly takes analgesics. The types of primary headaches that are commonly associated with rebound headaches include migraines, tension headaches, and cluster headaches. Analgesic rebound headaches can occur with frequent use of over-the-counter medicines and prescription medicines. Treatment involves stopping the medicine that is being overused. This will improve headache frequency and severity. This information is not intended to replace advice given to you by your health care provider. Make sure you discuss any questions you have with your healthcare provider. Document Revised: 12/22/2019 Document Reviewed: 12/22/2019 Elsevier Patient Education  [place] Tablets What is this medication? RIZATRIPTAN (rye za TRIP tan) treats migraines. It works by blocking pain signals and narrowing blood vessels in the brain. It belongs to a group ofmedications called triptans. It is not used to prevent migraines. This medicine may be used for other purposes; ask your health care provider orpharmacist if you have questions. COMMON BRAND NAME(S): Maxalt-MLT What should I tell my care team before I take this medication? They need to know if you have any of these conditions: Cigarette smoker Circulation problems in fingers and toes Diabetes Heart disease High blood pressure High cholesterol History of irregular heartbeat History of stroke Kidney  disease Liver disease Stomach or intestine problems An unusual or allergic reaction to rizatriptan, other medications, foods, dyes, or  preservatives Pregnant or trying to get pregnant Breast-feeding How should I use this medication? Take this medication by mouth. Follow the directions on the prescription label. Leave the tablet in the sealed blister pack until you are ready to take it. With dry hands, open the blister and gently remove the tablet. If the tablet breaks or crumbles, throw it away and take a new tablet out of the blister pack. Place the tablet in the mouth and allow it to dissolve, and then swallow. Do not cut, crush, or chew this medication. You do not need water to take thismedication. Do not take it more often than directed. Talk to your care team regarding the use of this medication in children. While this medication may be prescribed for children as young as 6 years for selectedconditions, precautions do apply. Overdosage: If you think you have taken too much of this medicine contact apoison control center or emergency room at once. NOTE: This medicine is only for you. Do not share this medicine with others. What if I miss a dose? This does not apply. This medication is not for regular use. What may interact with this medication? Do not take this medication with any of the following medications: Certain medications for migraine headache like almotriptan, eletriptan, frovatriptan, naratriptan, rizatriptan, sumatriptan, zolmitriptan Ergot alkaloids like dihydroergotamine, ergonovine, ergotamine, methylergonovine MAOIs like Carbex, Eldepryl, Marplan, Nardil, and Parnate This medication may also interact with the following medications: Certain medications for depression, anxiety, or psychotic disorders Propranolol This list may not describe all possible interactions. Give your health care provider a list of all the medicines, herbs, non-prescription drugs, or dietary supplements you use. Also tell them if you smoke, drink alcohol, or use illegaldrugs. Some items may interact with your medicine. What should I watch  for while using this medication? Visit your care team for regular checks on your progress. Tell your care teamif your symptoms do not start to get better or if they get worse. You may get drowsy or dizzy. Do not drive, use machinery, or do anything that needs mental alertness until you know how this medication affects you. Do not stand up or sit up quickly, especially if you are an older patient. This reduces the risk of dizzy or fainting spells. Alcohol may interfere with theeffect of this medication. Your mouth may get dry. Chewing sugarless gum or sucking hard candy and drinking plenty of water may help. Contact your care team if the problem doesnot go away or is severe. If you take migraine medications for 10 or more days a month, your migraines may get worse. Keep a diary of headache days and medication use. Contact yourcare team if your migraine attacks occur more frequently. What side effects may I notice from receiving this medication? Side effects that you should report to your care team as soon as possible: Allergic reactions-skin rash, itching, hives, swelling of the face, lips, tongue, or throat Burning, pain, tingling, or color changes in the legs or feet Heart attack-pain or tightness in the chest, shoulders, arms, or jaw, nausea, shortness of breath, cold or clammy skin, feeling faint or lightheaded Heart rhythm changes-fast or irregular heartbeat, dizziness, feeling faint or lightheaded, chest pain, trouble breathing Increase in blood pressure Irritability, confusion, fast or irregular heartbeat, muscle stiffness, twitching muscles, sweating, high fever, seizure, chills, vomiting, diarrhea, which may be signs of serotonin syndrome Raynaud's-cool, numb, or  painful fingers or toes that may change color from pale, to blue, to red Seizures Stroke-sudden numbness or weakness of the face, arm, or leg, trouble speaking, confusion, trouble walking, loss of balance or coordination, dizziness,  severe headache, change in vision Sudden or severe stomach pain, nausea, vomiting, fever, or bloody diarrhea Vision loss Side effects that usually do not require medical attention (report to your careteam if they continue or are bothersome): Dizziness General discomfort or fatigue This list may not describe all possible side effects. Call your doctor for medical advice about side effects. You may report side effects to FDA at1-800-FDA-1088. Where should I keep my medication? Keep out of the reach of children and pets. Store at room temperature between 15 and 30 degrees C (59 and 86 degrees F). Protect from light and moisture. Throw away any unused medication after theexpiration date. NOTE: This sheet is a summary. It may not cover all possible information. If you have questions about this medicine, talk to your doctor, pharmacist, orhealth care provider.  2022 Elsevier/Gold Standard (2020-12-19 16:19:19) Topiramate tablets What is this medication? TOPIRAMATE (toe PYRE a mate) treats seizures in people with epilepsy. It is also used to prevent migraines. It works by calming overactive nerves in yourbody. This medicine may be used for other purposes; ask your health care provider orpharmacist if you have questions. COMMON BRAND NAME(S): Topamax, Topiragen What should I tell my care team before I take this medication? They need to know if you have any of these conditions: Bleeding disorder Kidney disease Lung disease Suicidal thoughts, plans or attempt An unusual or allergic reaction to topiramate, other medications, foods, dyes, or preservatives Pregnant or trying to get pregnant Breast-feeding How should I use this medication? Take this medication by mouth with water. Take it as directed on the prescription label at the same time every day. Do not cut, crush or chew this medicine. Swallow the tablets whole. You can take it with or without food. If it upsets your stomach, take it with food.  Keep taking it unless your care teamtells you to stop. A special MedGuide will be given to you by the pharmacist with eachprescription and refill. Be sure to read this information carefully each time. Talk to your care team about the use of this medication in children. While it may be prescribed for children as young as 2 years for selected conditions,precautions do apply. Overdosage: If you think you have taken too much of this medicine contact apoison control center or emergency room at once. NOTE: This medicine is only for you. Do not share this medicine with others. What if I miss a dose? If you miss a dose, take it as soon as you can unless it is within 6 hours of the next dose. If it is within 6 hours of the next dose, skip the missed dose.Take the next dose at the normal time. Do not take double or extra doses. What may interact with this medication? Acetazolamide Alcohol Antihistamines for allergy, cough, and cold Aspirin and aspirin-like medications Atropine Birth control pills Certain medications for anxiety or sleep Certain medications for bladder problems like oxybutynin, tolterodine Certain medications for depression like amitriptyline, fluoxetine, sertraline Certain medications for Parkinson's disease like benztropine, trihexyphenidyl Certain medications for seizures like carbamazepine, lamotrigine, phenobarbital, phenytoin, primidone, valproic acid, zonisamide Certain medications for stomach problems like dicyclomine, hyoscyamine Certain medications for travel sickness like scopolamine Certain medications that treat or prevent blood clots like warfarin, enoxaparin, dalteparin, apixaban, dabigatran, and rivaroxaban Digoxin  Diltiazem General anesthetics like halothane, isoflurane, methoxyflurane, propofol Glyburide Hydrochlorothiazide Ipratropium Lithium Medications that relax muscles Metformin Narcotic medications for pain NSAIDs, medications for pain and inflammation,  like ibuprofen or naproxen Phenothiazines like chlorpromazine, mesoridazine, prochlorperazine, thioridazine Pioglitazone This list may not describe all possible interactions. Give your health care provider a list of all the medicines, herbs, non-prescription drugs, or dietary supplements you use. Also tell them if you smoke, drink alcohol, or use illegaldrugs. Some items may interact with your medicine. What should I watch for while using this medication? Visit your care team for regular checks on your progress. Tell your care teamif your symptoms do not start to get better or if they get worse. Do not suddenly stop taking this medication. You may develop a severe reaction. Your care team will tell you how much medication to take. If your care team wants you to stop the medication, the dose may be slowly lowered over time toavoid any side effects. Wear a medical ID bracelet or chain. Carry a card that describes yourcondition. List the medications and doses you take on the card. You may get drowsy or dizzy. Do not drive, use machinery, or do anything that needs mental alertness until you know how this medication affects you. Do not stand up or sit up quickly, especially if you are an older patient. This reduces the risk of dizzy or fainting spells. Alcohol may interfere with theeffects of this medication. Avoid alcoholic drinks. This medication may cause serious skin reactions. They can happen weeks to months after starting the medication. Contact your care team right away if you notice fevers or flu-like symptoms with a rash. The rash may be red or purple and then turn into blisters or peeling of the skin. Or, you might notice a red rash with swelling of the face, lips or lymph nodes in your neck or under yourarms. Watch for new or worsening thoughts of suicide or depression. This includes sudden changes in mood, behaviors, or thoughts. These changes can happen at any time but are more common in the  beginning of treatment or after a change in dose. Call your care team right away if you experience these thoughts orworsening depression. This medication may slow your child's growth if it is taken for a long time athigh doses. Your care team will monitor your child's growth. Using this medication for a long time may weaken your bones. The risk of bonefractures may be increased. Talk to your care team about your bone health. Do not become pregnant while taking this medication. Hormone forms of birth control may not work as well with this medication. Talk to your care team about other forms of birth control. There is potential for serious harm to an unbornchild. Tell your care team right away if you think you might be pregnant. What side effects may I notice from receiving this medication? Side effects that you should report to your care team as soon as possible: Allergic reactions-skin rash, itching, hives, swelling of the face, lips, tongue, or throat High acid levels-trouble breathing, fast irregular heartbeat, headache, confusion, unusually weak or tired, nausea, vomiting High ammonia levels-unusual weakness or fatigue, confusion, loss of appetite, nausea, vomiting, seizures High fever, fever that does not go away, or decreased sweating Kidney stones-blood in the urine, pain or trouble passing urine, pain in the lower back or sides Redness, blistering, peeling or loosening of the skin, including inside the mouth Sudden eye pain or change in vision such as blurry vision, seeing  halos around lights, vision loss Thoughts of suicide or self-harm, worsening mood, feelings of depression Side effects that usually do not require medical attention (report to your careteam if they continue or are bothersome): Anxiety, nervousness Change in taste Diarrhea Dizziness Drowsiness Fatigue Loss of appetite with weight loss Pain, tingling, or numbness in the hands or feet Trouble concentrating Trouble  speaking This list may not describe all possible side effects. Call your doctor for medical advice about side effects. You may report side effects to FDA at1-800-FDA-1088. Where should I keep my medication? Keep out of the reach of children and pets. Store between 15 and 30 degrees C (59 and 86 degrees F). Protect from moisture. Keep the container tightly closed. Get rid of any unused medication after theexpiration date. To get rid of medications that are no longer needed or have expired: Take the medication to a medication take-back program. Check with your pharmacy or law enforcement to find a location. If you cannot return the medication, check the label or package insert to see if the medication should be thrown out in the garbage or flushed down the toilet. If you are not sure, ask your care team. If it is safe to put it in the trash, empty the medication out of the container. Mix the medication with cat litter, dirt, coffee grounds, or other unwanted substance. Seal the mixture in a bag or container. Put it in the trash. NOTE: This sheet is a summary. It may not cover all possible information. If you have questions about this medicine, talk to your doctor, pharmacist, orhealth care provider.  2022 Elsevier/Gold Standard (2021-01-07 10:05:08) Galcanezumab injection What is this medication? GALCANEZUMAB (gal ka NEZ ue mab) is used to prevent migraines and treat clusterheadaches. This medicine may be used for other purposes; ask your health care provider orpharmacist if you have questions. COMMON BRAND NAME(S): Emgality What should I tell my care team before I take this medication? They need to know if you have any of these conditions: an unusual or allergic reaction to galcanezumab, other medicines, foods, dyes, or preservatives pregnant or trying to get pregnant breast-feeding How should I use this medication? This medicine is for injection under the skin. You will be taught how to prepare  and give this medicine. Use exactly as directed. Take your medicine atregular intervals. Do not take your medicine more often than directed. It is important that you put your used needles and syringes in a special sharps container. Do not put them in a trash can. If you do not have a sharpscontainer, call your pharmacist or healthcare provider to get one. Talk to your pediatrician regarding the use of this medicine in children.Special care may be needed. Overdosage: If you think you have taken too much of this medicine contact apoison control center or emergency room at once. NOTE: This medicine is only for you. Do not share this medicine with others. What if I miss a dose? If you miss a dose, take it as soon as you can. If it is almost time for yournext dose, take only that dose. Do not take double or extra doses. What may interact with this medication? Interactions are not expected. This list may not describe all possible interactions. Give your health care provider a list of all the medicines, herbs, non-prescription drugs, or dietary supplements you use. Also tell them if you smoke, drink alcohol, or use illegaldrugs. Some items may interact with your medicine. What should I watch for while using this  medication? Tell your doctor or healthcare professional if your symptoms do not start toget better or if they get worse. What side effects may I notice from receiving this medication? Side effects that you should report to your doctor or health care professionalas soon as possible: allergic reactions like skin rash, itching or hives, swelling of the face, lips, or tongue Side effects that usually do not require medical attention (report these toyour doctor or health care professional if they continue or are bothersome): pain, redness, or irritation at site where injected This list may not describe all possible side effects. Call your doctor for medical advice about side effects. You may report side  effects to FDA at1-800-FDA-1088. Where should I keep my medication? Keep out of the reach of children. You will be instructed on how to store this medicine. Throw away any unusedmedicine after the expiration date on the label. NOTE: This sheet is a summary. It may not cover all possible information. If you have questions about this medicine, talk to your doctor, pharmacist, orhealth care provider.  2022 Elsevier/Gold Standard (2018-05-12 12:03:23)

## 2021-06-14 NOTE — Progress Notes (Signed)
[facility]    Provider:  Dr Jaynee Eagles Requesting Provider: Dorothyann Peng, NP Primary Care Provider:  Dorothyann Peng, NP  CC:  migraines  HPI:  Donna Park is a 50 y.o. female here as requested by Dorothyann Peng, NP for migraine. Has had migraines since a little kid, extensive family history, her mother, brother, 51 of her sons all have migraines. She has headaches 25 days a month and 8 are moderatel to severe migraines that can last for 24-72 hours for over a year. Ibuprofen and goody powders help but she may be taking them daily, too much. She has lost 145 pounds. Headaches start start unilateral but can also be bilateral, pulsating/pounding/throbbing, spreads to the whole head, light/sound sensitivity, nausea, zofran to help stop vomiting, she can wake up with headaches. Imitrex makes her skin very sensitive. She sometimes has an aura. She is having blurry vision and she has loss of vision a black spot. Headaches change and can be worse positionally with blurred vision and she can wake with them.Worsening in quality and severity.  No other focal neurologic deficits, associated symptoms, inciting events or modifiable factors.  Reviewed notes, labs and imaging from outside physicians, which showed:  From a thorough review of records, medications tried that ca be used in migraine management include: Amitriptyline(cannot tolerate), sumatriptan, ibuprofen, ondansetron, blood pressure medications contraindicated due to hypotension, zomig, topamax,  02/2021: cbc unremarkable, cmp nml, tsh 2.21, hgba1c 5.4  Review of Systems: Patient complains of symptoms per HPI as well as the following symptoms headache. Pertinent negatives and positives per HPI. All others negative.   Social History   Socioeconomic History   Marital status: Legally Separated    Spouse name: Not on file   Number of children: Not on file   Years of education: Not on file   Highest education level: Not on  file  Occupational History   Not on file  Tobacco Use   Smoking status: Never   Smokeless tobacco: Never  Vaping Use   Vaping Use: Never used  Substance and Sexual Activity   Alcohol use: Yes    Comment: Social/Rare   Drug use: Never   Sexual activity: Yes    Birth control/protection: Surgical  Other Topics Concern   Not on file  Social History Narrative   Not on file   Social Determinants of Health   Financial Resource Strain: Not on file  Food Insecurity: Not on file  Transportation Needs: Not on file  Physical Activity: Not on file  Stress: Not on file  Social Connections: Not on file  Intimate Partner Violence: Not on file    Family History  Problem Relation Age of Onset   COPD Mother    Heart disease Mother    Alcohol abuse Mother    Depression Mother    Drug abuse Mother    High Cholesterol Mother    High blood pressure Mother    COPD Father    Aneurysm Father    Heart disease Father    Early death Father    Diabetes Father    Drug abuse Father    AAA (abdominal aortic aneurysm) Father        cause of death at 47   Arthritis Sister    Kidney disease Sister    Asthma Neg Hx    Allergic rhinitis Neg Hx     Past Medical History:  Diagnosis Date   Anxiety    Asthma    Chicken pox  Complication of anesthesia    slow to wake up from anesthesia   DJD (degenerative joint disease)    GERD (gastroesophageal reflux disease)    resolved after gastric surgery   H/O gastric bypass 2016   History of iron deficiency anemia    HSV infection    Migraines    Obese    Pneumonia    Childhood history   PONV (postoperative nausea and vomiting)    Varicose vein of leg     Patient Active Problem List   Diagnosis Date Noted   Chronic migraine without aura without status migrainosus, not intractable 06/14/2021   S/P laparoscopic assisted vaginal hysterectomy (LAVH) 05/15/2020   Migraines    GERD (gastroesophageal reflux disease)    Asthma    Genital warts     Seasonal and perennial allergic rhinitis 02/10/2019   Moderate persistent asthma, uncomplicated 07/37/1062   Anaphylactic shock due to adverse food reaction 02/10/2019   Atopic dermatitis 02/10/2019   H/O gastric bypass 2016    Past Surgical History:  Procedure Laterality Date   ANKLE FRACTURE SURGERY Left 03/08/2009   BREAST SURGERY Bilateral    Cyst Removal   GASTRIC BYPASS  06/18/2015   LAPAROSCOPIC VAGINAL HYSTERECTOMY WITH SALPINGECTOMY Bilateral 05/15/2020   Procedure: LAPAROSCOPIC ASSISTED VAGINAL HYSTERECTOMY WITH SALPINGECTOMY;  Surgeon: Louretta Shorten, MD;  Location: [facility];  Service: Gynecology;  Laterality: Bilateral;  need bed   LASER ABLATION     Varicose veins   TONSILLECTOMY AND ADENOIDECTOMY  2001   TUBAL LIGATION  12/19/1999   UPPER GI ENDOSCOPY     Uterine ablation  12/2018    Current Outpatient Medications  Medication Sig Dispense Refill   acetaminophen (TYLENOL) 500 MG tablet Take by mouth.     albuterol (VENTOLIN HFA) 108 (90 Base) MCG/ACT inhaler INHALE 1-2 PUFFS INTO THE LUNGS EVERY 6 HOURS AS NEEDED FOR WHEEZING OR SHORTNESS OF BREATH. 54 g 0   amitriptyline (ELAVIL) 50 MG tablet TAKE 1 TABLET (50 MG TOTAL) BY MOUTH AT BEDTIME. 30 tablet 0   Azelastine-Fluticasone 137-50 MCG/ACT SUSP PLACE 1 SPRAY INTO BOTH NOSTRILS 2 TIMES DAILY AS NEEDED. 69 g 1   CALCIUM PO Take 1 tablet by mouth 4 (four) times a week.     cetirizine (ZYRTEC) 10 MG tablet Take 2 tablets (20 mg total) by mouth in the morning and at bedtime. 360 tablet 1   COVID-19 At Home Antigen Test KIT USE AS DIRECTED WITHIN PACKAGE INSERT 4 kit 0   Cyanocobalamin (NASCOBAL NA) Place 1 spray into the nose every Sunday.     diazepam (VALIUM) 10 MG tablet TAKE 1 TABLET BY MOUTH NIGHTLY AS NEEDED FOR SLEEP. 30 tablet 2   EPINEPHrine 0.3 mg/0.3 mL IJ SOAJ injection INJECT 0.3 MG INTO THE MUSCLE ONCE FOR 1 DOSE. 2 each 1   fluconazole (DIFLUCAN) 150 MG tablet TAKE 1 TABLET (150 MG TOTAL)  BY MOUTH DAILY. 14 tablet 3   fluticasone-salmeterol (ADVAIR HFA) 230-21 MCG/ACT inhaler INHALE 2 PUFFS INTO THE LUNGS 2 (TWO) TIMES DAILY. 36 g 1   Fluticasone-Umeclidin-Vilant (TRELEGY ELLIPTA) 200-62.5-25 MCG/INH AEPB Inhale 1 puff into the lungs daily. 90 each 1   Galcanezumab-gnlm (EMGALITY) 120 MG/ML SOAJ Inject 120 mg into the skin every 30 (thirty) days. 1 mL 11   hydrocortisone 2.5 % ointment APPLY TO THE AFFECTED AREA(S) TWO TIMES DAILY AS NEEDED 454 g 0   Hypertonic Nasal Wash (SINUS RINSE REFILL) PACK Use one packet dissolved in water as  needed. (Patient taking differently: Place 1 each into the nose in the morning and at bedtime.) 100 each 5   ibuprofen (ADVIL) 800 MG tablet Take 1 tablet (800 mg total) by mouth 3 (three) times daily as needed (pain.). 270 tablet 0   imiquimod (ALDARA) 5 % cream APPLY TOPICALLY TO AFFECTED AREA DAILY FOR 5 DAYS AS NEEDED. 24 each 0   ipratropium (ATROVENT) 0.06 % nasal spray Place 2 sprays into both nostrils 4 (four) times daily. 45 mL 3   meclizine (ANTIVERT) 25 MG tablet Take 25 mg by mouth 3 (three) times daily as needed (dizziness/vertigo).      MUCUS RELIEF ER 600 MG 12 hr tablet Take 600-1,200 mg by mouth 2 (two) times daily as needed (congestion).      Olopatadine HCl 0.2 % SOLN PLACE 1 DROP INTO BOTH EYES TWICE DAILY AS NEEDED. 7.5 mL 3   ondansetron (ZOFRAN-ODT) 4 MG disintegrating tablet PLACE 1 TABLET ONTO THE TONGUE EVERY 8 HOURS AS NEEDED FOR NAUSEA. 90 tablet 0   ondansetron (ZOFRAN-ODT) 4 MG disintegrating tablet Take 1-2 tablets (4-8 mg total) by mouth every 8 (eight) hours as needed. May take with Rizatriptan 30 tablet 3   pantoprazole (PROTONIX) 40 MG tablet TAKE 1 TABLET (40 MG TOTAL) BY MOUTH AT BEDTIME. 90 tablet 3   Pediatric Multiple Vit-C-FA (MULTIVITAMIN ANIMAL SHAPES, WITH CA/FA,) with C & FA chewable tablet Chew 2 tablets by mouth daily.     simethicone (MYLICON) 80 MG chewable tablet Chew 80 mg by mouth every 6 (six) hours as  needed for flatulence.     tezepelumab-ekko (TEZSPIRE) 210 MG/1.91ML syringe Inject 1.91 mLs (210 mg total) into the skin every 28 (twenty-eight) days. 1.91 mL 11   tinidazole (TINDAMAX) 500 MG tablet TAKE 2 TABLETS BY MOUTH DAILY FOR 5 DAYS. 10 tablet 0   topiramate (TOPAMAX) 50 MG tablet Take 1 tablet (50 mg total) by mouth at bedtime. 30 tablet 6   traZODone (DESYREL) 50 MG tablet TAKE 0.5-1 TABLET (25-50 MG TOTAL) BY MOUTH AT BEDTIME AS NEEDED FOR SLEEP. 90 tablet 0   triamcinolone ointment (KENALOG) 0.1 % APPLY TO AFFECTED AREA(S) 2 TIMES A DAY 454 g 2   triamcinolone ointment (KENALOG) 0.1 % APPLY TO AFFECTED AREA(S) 2 TIMES A DAY 454 g 2   triamcinolone ointment (KENALOG) 0.1 % APPLY TO THE AFFECTED AREA(S) TWO TIMES DAILY 454 g 2   valACYclovir (VALTREX) 500 MG tablet TAKE 2 TABLETS (1,000 MG TOTAL) BY MOUTH AT BEDTIME. 180 tablet 3   Vibegron (GEMTESA) 75 MG TABS Take 1 tablet by mouth daily. 30 tablet 11   Vitamin D, Ergocalciferol, (DRISDOL) 1.25 MG (50000 UNIT) CAPS capsule TAKE 1 CAPSULE BY MOUTH ONCE A WEEK. 12 capsule 3   Vitamin D, Ergocalciferol, (DRISDOL) 1.25 MG (50000 UNIT) CAPS capsule TAKE 1 CAPSULE BY MOUTH ONCE A WEEK. 12 capsule 11   rizatriptan (MAXALT-MLT) 10 MG disintegrating tablet Dissolve 1 tablet (10 mg total) by mouth as needed for migraine. May repeat in 2 hours if needed 9 tablet 11   Current Facility-Administered Medications  Medication Dose Route Frequency Provider Last Rate Last Admin   tezepelumab-ekko (TEZSPIRE) 210 MG/1.91ML syringe 210 mg  210 mg Subcutaneous Q28 days Valentina Shaggy, MD   210 mg at 06/06/21 1622    Allergies as of 06/14/2021 - Review Complete 06/14/2021  Allergen Reaction Noted   Cyclobenzaprine Shortness Of Breath and Other (See Comments) 07/09/2012   Shellfish allergy Shortness Of Breath  11/10/2011   Sulfa antibiotics Anaphylaxis, Hives, and Swelling 05/25/2007   Carisoprodol Hives 12/15/2012   Morphine Other (See Comments)  06/18/2015   Adhesive [tape] Rash 04/05/2015    Vitals: BP 115/80   Pulse 74   Ht 5' 6"  (1.676 m)   Wt 188 lb (85.3 kg)   BMI 30.34 kg/m  Last Weight:  Wt Readings from Last 1 Encounters:  06/14/21 188 lb (85.3 kg)   Last Height:   Ht Readings from Last 1 Encounters:  06/14/21 5' 6"  (1.676 m)   *difficulty bc of cataracts  Physical exam: Exam: Gen: NAD, conversant, well nourised, well groomed                     CV: RRR, no MRG. No Carotid Bruits. No peripheral edema, warm, nontender Eyes: Conjunctivae clear without exudates or hemorrhage  Neuro: Detailed Neurologic Exam  Speech:    Speech is normal; fluent and spontaneous with normal comprehension.  Cognition:    The patient is oriented to person, place, and time;     recent and remote memory intact;     language fluent;     normal attention, concentration,     fund of knowledge Cranial Nerves:    The pupils are equal, round, and reactive to light. The fundi are normal and spontaneous venous pulsations are present. Visual fields are full to finger confrontation. Extraocular movements are intact. Trigeminal sensation is intact and the muscles of mastication are normal. The face is symmetric. The palate elevates in the midline. Hearing intact. Voice is normal. Shoulder shrug is normal. The tongue has normal motion without fasciculations.   Coordination:    Normal Gait:    normal.   Motor Observation:    No asymmetry, no atrophy, and no involuntary movements noted. Tone:    Normal muscle tone.    Posture:    Posture is normal. normal erect    Strength:    Strength is V/V in the upper and lower limbs.      Sensation: intact to LT     Reflex Exam:  DTR's:    Deep tendon reflexes in the upper and lower extremities are normal bilaterally.   Toes:    The toes are downgoing bilaterally.   Clonus:    Clonus is absent.    Assessment/Plan:  Patient with chronic migraines but given some concerning symptoms  recommends= MRI brain  Start Emgality as prevention once monthly injection Start Topiramate at bedtime as prevention Acute/emergent: Stop Imitrex. Try Rizatriptan: Please take one tablet at the onset of your headache. If it does not improve the symptoms please take one additional tablet. Do not take more then 2 tablets in 24hrs. Do not take use more then 2 to 3 times in a week.May take Rizatriptan with ondansetron. Other options for acute/emergency meds include Ubrelvy, Nurtec and many other options MRI brain: MRI brain due to concerning symptoms of morning headaches, positional headaches,vision changes  to look for space occupying mass, chiari or intracranial hypertension (pseudotumor). Try to reduce analgesic use (goodys, ibuprofen, tylenol) to 2 days a week  There is increased risk for stroke in women with migraine with aura and a contraindication for the combined contraceptive pill for use by women who have migraine with aura. The risk for women with migraine without aura is lower. However other risk factors like smoking are far more likely to increase stroke risk than migraine. There is a recommendation for no smoking and for the use  of OCPs without estrogen such as progestogen only pills particularly for women with migraine with aura.Marland Kitchen People who have migraine headaches with auras may be 3 times more likely to have a stroke caused by a blood clot, compared to migraine patients who don't see auras. Women who take hormone-replacement therapy may be 30 percent more likely to suffer a clot-based stroke than women not taking medication containing estrogen. Other risk factors like smoking and high blood pressure may be  much more important.  Orders Placed This Encounter  Procedures   MR BRAIN W WO CONTRAST    Meds ordered this encounter  Medications   topiramate (TOPAMAX) 50 MG tablet    Sig: Take 1 tablet (50 mg total) by mouth at bedtime.    Dispense:  30 tablet    Refill:  6    Galcanezumab-gnlm (EMGALITY) 120 MG/ML SOAJ    Sig: Inject 120 mg into the skin every 30 (thirty) days.    Dispense:  1 mL    Refill:  11   DISCONTD: rizatriptan (MAXALT-MLT) 10 MG disintegrating tablet    Sig: Take 1 tablet (10 mg total) by mouth as needed for migraine. May repeat in 2 hours if needed    Dispense:  9 tablet    Refill:  11   ondansetron (ZOFRAN-ODT) 4 MG disintegrating tablet    Sig: Take 1-2 tablets (4-8 mg total) by mouth every 8 (eight) hours as needed. May take with Rizatriptan    Dispense:  30 tablet    Refill:  3    Cc: Nafziger, Tommi Rumps, NP,  Nafziger, Miller, NP  Sarina Ill, MD  [facility] [address] [place], [place] [address]  Phone [phone] Fax [phone]

## 2021-06-17 ENCOUNTER — Encounter: Payer: Self-pay | Admitting: Neurology

## 2021-06-17 ENCOUNTER — Other Ambulatory Visit (HOSPITAL_COMMUNITY): Payer: Self-pay

## 2021-06-17 MED ORDER — RIZATRIPTAN BENZOATE 10 MG PO TBDP
10.0000 mg | ORAL_TABLET | ORAL | 11 refills | Status: DC | PRN
  Filled 2021-06-17 – 2021-08-23 (×2): qty 9, 30d supply, fill #0

## 2021-06-18 ENCOUNTER — Telehealth: Payer: Self-pay | Admitting: *Deleted

## 2021-06-18 NOTE — Telephone Encounter (Signed)
Completed Emgality PA on Cover My Meds. Key: [id number]. Awaiting determination from [place].

## 2021-06-19 ENCOUNTER — Other Ambulatory Visit (HOSPITAL_COMMUNITY): Payer: Self-pay

## 2021-06-20 NOTE — Telephone Encounter (Signed)
The request has been approved. The authorization is effective for a maximum of 1 fills from 06/20/2021 to 07/20/2021, as long as the member is enrolled in their current health plan. The request was reviewed and approved by a licensed clinical pharmacist. This request is approved for 23mL (2 120mg /mL pens) per month.Followed by prior authorization [id number] entered for Emgality 120mg /ml pens, this request is effective from 07/13/2021 through 12/12/2021 and is limited to 78mL (1 120MG /mL pen) per 30 days with 5 fills. A written notification letter will follow with additional details.

## 2021-07-01 ENCOUNTER — Other Ambulatory Visit (HOSPITAL_COMMUNITY): Payer: Self-pay

## 2021-07-03 ENCOUNTER — Ambulatory Visit (HOSPITAL_COMMUNITY)
Admission: EM | Admit: 2021-07-03 | Discharge: 2021-07-03 | Disposition: A | Discharge status: Home / Self Care | Payer: 59 | Attending: Urgent Care | Admitting: Urgent Care

## 2021-07-03 ENCOUNTER — Encounter (HOSPITAL_COMMUNITY): Payer: Self-pay | Admitting: Urgent Care

## 2021-07-03 ENCOUNTER — Ambulatory Visit (INDEPENDENT_AMBULATORY_CARE_PROVIDER_SITE_OTHER): Payer: 59

## 2021-07-03 ENCOUNTER — Other Ambulatory Visit: Payer: Self-pay

## 2021-07-03 DIAGNOSIS — Z9884 Bariatric surgery status: Secondary | ICD-10-CM | POA: Insufficient documentation

## 2021-07-03 DIAGNOSIS — Z888 Allergy status to other drugs, medicaments and biological substances status: Secondary | ICD-10-CM | POA: Insufficient documentation

## 2021-07-03 DIAGNOSIS — Z6830 Body mass index (BMI) 30.0-30.9, adult: Secondary | ICD-10-CM | POA: Diagnosis not present

## 2021-07-03 DIAGNOSIS — Z881 Allergy status to other antibiotic agents status: Secondary | ICD-10-CM | POA: Diagnosis not present

## 2021-07-03 DIAGNOSIS — N760 Acute vaginitis: Secondary | ICD-10-CM | POA: Diagnosis not present

## 2021-07-03 DIAGNOSIS — Z885 Allergy status to narcotic agent status: Secondary | ICD-10-CM | POA: Insufficient documentation

## 2021-07-03 DIAGNOSIS — Z8249 Family history of ischemic heart disease and other diseases of the circulatory system: Secondary | ICD-10-CM | POA: Insufficient documentation

## 2021-07-03 DIAGNOSIS — Z79899 Other long term (current) drug therapy: Secondary | ICD-10-CM | POA: Diagnosis not present

## 2021-07-03 DIAGNOSIS — Z882 Allergy status to sulfonamides status: Secondary | ICD-10-CM | POA: Diagnosis not present

## 2021-07-03 DIAGNOSIS — Z7951 Long term (current) use of inhaled steroids: Secondary | ICD-10-CM | POA: Diagnosis not present

## 2021-07-03 DIAGNOSIS — N898 Other specified noninflammatory disorders of vagina: Secondary | ICD-10-CM

## 2021-07-03 DIAGNOSIS — E669 Obesity, unspecified: Secondary | ICD-10-CM | POA: Diagnosis not present

## 2021-07-03 DIAGNOSIS — B9689 Other specified bacterial agents as the cause of diseases classified elsewhere: Secondary | ICD-10-CM | POA: Diagnosis not present

## 2021-07-03 DIAGNOSIS — R0789 Other chest pain: Secondary | ICD-10-CM | POA: Insufficient documentation

## 2021-07-03 DIAGNOSIS — R079 Chest pain, unspecified: Secondary | ICD-10-CM

## 2021-07-03 MED ORDER — KETOROLAC TROMETHAMINE 60 MG/2ML IM SOLN
60.0000 mg | Freq: Once | INTRAMUSCULAR | Status: DC
Start: 2021-07-03 — End: 2021-07-03

## 2021-07-03 MED ORDER — FLUCONAZOLE 150 MG PO TABS
150.0000 mg | ORAL_TABLET | ORAL | 0 refills | Status: DC
  Filled 2021-07-03: qty 2, 14d supply, fill #0

## 2021-07-03 MED ORDER — ACETAMINOPHEN 325 MG PO TABS
975.0000 mg | ORAL_TABLET | Freq: Once | ORAL | Status: AC
  Administered 2021-07-03: 975 mg via ORAL

## 2021-07-03 MED ORDER — ACETAMINOPHEN 325 MG PO TABS
ORAL_TABLET | ORAL | Status: AC
  Filled 2021-07-03: qty 3

## 2021-07-03 MED ORDER — TINIDAZOLE 500 MG PO TABS
1000.0000 mg | ORAL_TABLET | Freq: Every day | ORAL | 0 refills | Status: DC
  Filled 2021-07-03: qty 10, 5d supply, fill #0

## 2021-07-03 MED ORDER — TIZANIDINE HCL 4 MG PO TABS
4.0000 mg | ORAL_TABLET | Freq: Three times a day (TID) | ORAL | 0 refills | Status: DC | PRN
  Filled 2021-07-03: qty 30, 10d supply, fill #0

## 2021-07-03 NOTE — Discharge Instructions (Addendum)
Do not use any nonsteroidal anti-inflammatories (NSAIDs) like ibuprofen, Motrin, naproxen, Aleve, etc. which are all available over-the-counter.  Please just use Tylenol at a dose of '500mg'$ -'650mg'$  once every 6 hours as needed for your chest pain, aches, pains, fevers.
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
## 2021-07-04 ENCOUNTER — Other Ambulatory Visit (HOSPITAL_COMMUNITY): Payer: Self-pay

## 2021-07-04 ENCOUNTER — Ambulatory Visit (INDEPENDENT_AMBULATORY_CARE_PROVIDER_SITE_OTHER): Payer: 59 | Admitting: *Deleted

## 2021-07-04 DIAGNOSIS — J4540 Moderate persistent asthma, uncomplicated: Secondary | ICD-10-CM

## 2021-07-04 DIAGNOSIS — J4550 Severe persistent asthma, uncomplicated: Secondary | ICD-10-CM

## 2021-07-04 NOTE — Patient Instructions (Addendum)
1. Moderate persistent asthma, uncomplicated -Start prednisone 10 mg taking 1 tablet twice a day for 4 days, then 1 tablet on the fifth day and stop - Daily controller medication(s): Trelegy 200/62.5/25 one puff once daily and Tezspire every four weeks. Will discuss with Dr. Ernst Bowler once he returns about whether he thinks that you should continue Tezspire due to itching and occasional hives - Prior to physical activity: albuterol 2 puffs 10-15 minutes before physical activity. - Rescue medications: albuterol 4 puffs every 4-6 hours as needed - Changes during respiratory infections or worsening symptoms: ADD ON Arnuity 248mg to 1 puff twice daily for ONE TO TWO WEEKS. - Asthma control goals:  * Full participation in all desired activities (may need albuterol before activity) * Albuterol use two time or less a week on average (not counting use with activity) * Cough interfering with sleep two time or less a month * Oral steroids no more than once a year * No hospitalizations  2. Seasonal and perennial allergic rhinitis - Continue with cetirizine '20mg'$  twice daily. - Continue ipratropium bromide nasal spray 2 sprays each nostril twice a day as needed. Do not use on the same day that you use Dymista - Continue with Dymista one sprays per nostril up to twice daily. - Continue with nasal saline rinses.   3. Anaphylactic shock due to food - Avoid shrimp.Able to tolerate other shellfish. In case of an allergic reaction, give Benadryl 4 teaspoonfuls every 4 hours, and if life-threatening symptoms occur, inject with EpiPen 0.3 mg. . Please let [place] if this treatment plan is not working well for you Schedule a follow-up appointment in 2 months or sooner if needed

## 2021-07-05 ENCOUNTER — Other Ambulatory Visit: Payer: Self-pay

## 2021-07-05 ENCOUNTER — Other Ambulatory Visit (HOSPITAL_COMMUNITY): Payer: Self-pay

## 2021-07-05 ENCOUNTER — Encounter: Payer: Self-pay | Admitting: Family

## 2021-07-05 ENCOUNTER — Ambulatory Visit (INDEPENDENT_AMBULATORY_CARE_PROVIDER_SITE_OTHER): Payer: 59 | Admitting: Family

## 2021-07-05 VITALS — BP 120/76 | HR 82 | Resp 16 | Ht 66.0 in | Wt 187.5 lb

## 2021-07-05 DIAGNOSIS — L508 Other urticaria: Secondary | ICD-10-CM

## 2021-07-05 DIAGNOSIS — J4540 Moderate persistent asthma, uncomplicated: Secondary | ICD-10-CM | POA: Diagnosis not present

## 2021-07-05 DIAGNOSIS — J3089 Other allergic rhinitis: Secondary | ICD-10-CM | POA: Diagnosis not present

## 2021-07-05 DIAGNOSIS — J302 Other seasonal allergic rhinitis: Secondary | ICD-10-CM | POA: Diagnosis not present

## 2021-07-05 DIAGNOSIS — T7800XD Anaphylactic reaction due to unspecified food, subsequent encounter: Secondary | ICD-10-CM

## 2021-07-05 LAB — CERVICOVAGINAL ANCILLARY ONLY
Bacterial Vaginitis (gardnerella): POSITIVE — AB
Candida Glabrata: NEGATIVE
Candida Vaginitis: NEGATIVE
Chlamydia: NEGATIVE
Comment: NEGATIVE
Comment: NEGATIVE
Comment: NEGATIVE
Comment: NEGATIVE
Comment: NEGATIVE
Comment: NORMAL
Neisseria Gonorrhea: NEGATIVE
Trichomonas: NEGATIVE

## 2021-07-05 MED ORDER — ALBUTEROL SULFATE HFA 108 (90 BASE) MCG/ACT IN AERS
1.0000 | INHALATION_SPRAY | Freq: Four times a day (QID) | RESPIRATORY_TRACT | 0 refills | Status: DC | PRN
  Filled 2021-07-05: qty 54, 75d supply, fill #0

## 2021-07-05 MED ORDER — AZELASTINE-FLUTICASONE 137-50 MCG/ACT NA SUSP
1.0000 | Freq: Two times a day (BID) | NASAL | 5 refills | Status: DC
  Filled 2021-07-05: qty 23, 30d supply, fill #0
  Filled 2021-10-17: qty 23, fill #0
  Filled 2021-10-17: qty 23, 30d supply, fill #0

## 2021-07-05 MED ORDER — EPINEPHRINE 0.3 MG/0.3ML IJ SOAJ
0.3000 mg | INTRAMUSCULAR | 1 refills | Status: DC | PRN
  Filled 2021-07-05: qty 2, 30d supply, fill #0
  Filled 2022-05-01: qty 2, 30d supply, fill #1

## 2021-07-05 MED ORDER — IPRATROPIUM BROMIDE 0.06 % NA SOLN
2.0000 | Freq: Four times a day (QID) | NASAL | 3 refills | Status: DC
  Filled 2021-07-05: qty 45, 30d supply, fill #0
  Filled 2021-10-17: qty 45, 30d supply, fill #1

## 2021-07-05 MED ORDER — CETIRIZINE HCL 10 MG PO TABS
20.0000 mg | ORAL_TABLET | Freq: Two times a day (BID) | ORAL | 1 refills | Status: DC
  Filled 2021-07-05: qty 360, 90d supply, fill #0

## 2021-07-05 MED ORDER — TRELEGY ELLIPTA 200-62.5-25 MCG/INH IN AEPB
1.0000 | INHALATION_SPRAY | Freq: Every day | RESPIRATORY_TRACT | 1 refills | Status: DC
  Filled 2021-07-05: qty 180, 90d supply, fill #0
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
## 2021-07-08 ENCOUNTER — Other Ambulatory Visit (HOSPITAL_COMMUNITY): Payer: Self-pay

## 2021-07-09 ENCOUNTER — Other Ambulatory Visit (HOSPITAL_COMMUNITY): Payer: Self-pay

## 2021-07-10 ENCOUNTER — Other Ambulatory Visit: Payer: Self-pay

## 2021-07-10 ENCOUNTER — Ambulatory Visit (INDEPENDENT_AMBULATORY_CARE_PROVIDER_SITE_OTHER): Payer: 59

## 2021-07-10 ENCOUNTER — Other Ambulatory Visit (HOSPITAL_COMMUNITY): Payer: Self-pay

## 2021-07-10 DIAGNOSIS — R51 Headache with orthostatic component, not elsewhere classified: Secondary | ICD-10-CM

## 2021-07-10 DIAGNOSIS — R519 Headache, unspecified: Secondary | ICD-10-CM

## 2021-07-10 DIAGNOSIS — H539 Unspecified visual disturbance: Secondary | ICD-10-CM

## 2021-07-10 MED ORDER — GADOBENATE DIMEGLUMINE 529 MG/ML IV SOLN
17.0000 mL | Freq: Once | INTRAVENOUS | Status: AC | PRN
  Administered 2021-07-10: 17 mL via INTRAVENOUS

## 2021-07-12 ENCOUNTER — Other Ambulatory Visit (HOSPITAL_COMMUNITY): Payer: Self-pay

## 2021-07-15 ENCOUNTER — Other Ambulatory Visit (HOSPITAL_COMMUNITY): Payer: Self-pay

## 2021-07-16 ENCOUNTER — Other Ambulatory Visit (HOSPITAL_COMMUNITY): Payer: Self-pay

## 2021-07-16 ENCOUNTER — Other Ambulatory Visit: Payer: Self-pay | Admitting: Adult Health

## 2021-07-16 DIAGNOSIS — G43919 Migraine, unspecified, intractable, without status migrainosus: Secondary | ICD-10-CM

## 2021-07-16 MED ORDER — CARESTART COVID-19 HOME TEST VI KIT
PACK | 0 refills | Status: DC
Start: 2021-07-16 — End: 2022-02-04
  Filled 2021-07-16: qty 4, 4d supply, fill #0

## 2021-07-16 MED FILL — Ergocalciferol Cap 1.25 MG (50000 Unit): ORAL | 84 days supply | Qty: 12 | Fill #0 | Status: CN

## 2021-07-16 MED FILL — Pantoprazole Sodium EC Tab 40 MG (Base Equiv): ORAL | 90 days supply | Qty: 90 | Fill #0 | Status: AC

## 2021-07-16 MED FILL — Valacyclovir HCl Tab 500 MG: ORAL | 90 days supply | Qty: 180 | Fill #1 | Status: AC

## 2021-07-16 MED FILL — Triamcinolone Acetonide Oint 0.1%: CUTANEOUS | 30 days supply | Qty: 454 | Fill #0 | Status: AC

## 2021-07-16 MED FILL — COVID-19 At Home Antigen Test Kit: 4 days supply | Qty: 4 | Fill #0 | Status: CN

## 2021-07-17 ENCOUNTER — Other Ambulatory Visit (HOSPITAL_COMMUNITY): Payer: Self-pay

## 2021-07-17 MED ORDER — IBUPROFEN 800 MG PO TABS
800.0000 mg | ORAL_TABLET | Freq: Three times a day (TID) | ORAL | 0 refills | Status: DC | PRN
  Filled 2021-07-17 – 2021-08-23 (×2): qty 270, 90d supply, fill #0

## 2021-07-18 ENCOUNTER — Telehealth: Payer: Self-pay | Admitting: Allergy & Immunology

## 2021-07-18 ENCOUNTER — Other Ambulatory Visit (HOSPITAL_COMMUNITY): Payer: Self-pay

## 2021-07-18 NOTE — Telephone Encounter (Signed)
-----   Message from Althea Charon, [place] sent at 07/05/2021 11:40 AM EDT ----- What is your thoughts about stopping Tezspire after having : itching after her Tezspire injection  yesterday at the injection site and possible correlation with hives on July 6th ? Next dose 08/01/2021. Thank you.

## 2021-07-18 NOTE — Progress Notes (Signed)
Called to discuss with patient.  She said she had hives before and these are fairly similar to those.  She is using Zyrtec 30 mg twice daily.  Obviously when she was on the Xolair, her hives were nonexistent.  However, she feels that her asthma control is better on the Tezspire.  She wants to continue with the Tezspire for now.   Salvatore Marvel, MD Allergy and [place] [facility] [place]

## 2021-07-19 ENCOUNTER — Other Ambulatory Visit (HOSPITAL_COMMUNITY): Payer: Self-pay

## 2021-07-22 ENCOUNTER — Other Ambulatory Visit (HOSPITAL_COMMUNITY): Payer: Self-pay

## 2021-07-23 ENCOUNTER — Other Ambulatory Visit (HOSPITAL_COMMUNITY): Payer: Self-pay

## 2021-07-24 ENCOUNTER — Other Ambulatory Visit (HOSPITAL_COMMUNITY): Payer: Self-pay

## 2021-07-25 ENCOUNTER — Other Ambulatory Visit (HOSPITAL_COMMUNITY): Payer: Self-pay

## 2021-07-25 ENCOUNTER — Other Ambulatory Visit: Payer: Self-pay

## 2021-07-25 ENCOUNTER — Ambulatory Visit (INDEPENDENT_AMBULATORY_CARE_PROVIDER_SITE_OTHER): Payer: 59 | Admitting: *Deleted

## 2021-07-25 ENCOUNTER — Ambulatory Visit: Payer: 59

## 2021-07-25 DIAGNOSIS — J4540 Moderate persistent asthma, uncomplicated: Secondary | ICD-10-CM

## 2021-07-26 ENCOUNTER — Other Ambulatory Visit (HOSPITAL_COMMUNITY): Payer: Self-pay

## 2021-07-26 MED ORDER — QUICKVUE AT-HOME COVID-19 TEST VI KIT
PACK | 0 refills | Status: DC
  Filled 2021-07-26: qty 2, 2d supply, fill #0

## 2021-07-29 ENCOUNTER — Other Ambulatory Visit (HOSPITAL_COMMUNITY): Payer: Self-pay

## 2021-08-01 ENCOUNTER — Ambulatory Visit: Payer: 59

## 2021-08-05 ENCOUNTER — Telehealth: Payer: Self-pay | Admitting: Allergy & Immunology

## 2021-08-05 MED ORDER — POLYMYXIN B-TRIMETHOPRIM 10000-0.1 UNIT/ML-% OP SOLN
1.0000 [drp] | OPHTHALMIC | 0 refills | Status: AC
Start: 2021-08-05 — End: 2021-08-12

## 2021-08-05 NOTE — Telephone Encounter (Signed)
Patient contacted me about draining eyes and crusting bilaterally. She is having no fever or pain with eye movement. Confirmed pharmacy (patient is out of town) and sent in PolyTrim drops.   Salvatore Marvel, MD [facility] [place] [facility] [place]

## 2021-08-07 ENCOUNTER — Other Ambulatory Visit: Payer: Self-pay | Admitting: Adult Health

## 2021-08-08 ENCOUNTER — Other Ambulatory Visit (HOSPITAL_COMMUNITY): Payer: Self-pay

## 2021-08-08 ENCOUNTER — Other Ambulatory Visit: Payer: Self-pay | Admitting: Adult Health

## 2021-08-08 MED ORDER — IMIQUIMOD 5 % EX CREA
TOPICAL_CREAM | CUTANEOUS | 0 refills | Status: DC
  Filled 2021-08-23: qty 24, 90d supply, fill #0

## 2021-08-13 ENCOUNTER — Other Ambulatory Visit: Payer: Self-pay | Admitting: Neurology

## 2021-08-13 MED ORDER — METHYLPREDNISOLONE 4 MG PO TBPK: ORAL_TABLET | ORAL | 1 refills | Status: DC

## 2021-08-13 MED ORDER — RIZATRIPTAN BENZOATE 10 MG PO TBDP: 10.0000 mg | ORAL_TABLET | ORAL | 11 refills | Status: DC | PRN

## 2021-08-23 ENCOUNTER — Other Ambulatory Visit: Payer: Self-pay | Admitting: Neurology

## 2021-08-23 ENCOUNTER — Other Ambulatory Visit: Payer: Self-pay | Admitting: Urgent Care

## 2021-08-23 ENCOUNTER — Other Ambulatory Visit (HOSPITAL_COMMUNITY): Payer: Self-pay

## 2021-08-23 ENCOUNTER — Other Ambulatory Visit: Payer: Self-pay | Admitting: Adult Health

## 2021-08-23 MED ORDER — RIZATRIPTAN BENZOATE 10 MG PO TBDP
10.0000 mg | ORAL_TABLET | ORAL | 4 refills | Status: DC | PRN
  Filled 2021-08-23: qty 27, 90d supply, fill #0
  Filled 2021-11-08: qty 27, 90d supply, fill #1
  Filled 2022-05-01: qty 27, 90d supply, fill #2
  Filled 2022-07-26: qty 27, 90d supply, fill #3

## 2021-08-23 MED FILL — Diazepam Tab 10 MG: ORAL | Qty: 30 | Fill #0 | Status: CN

## 2021-08-23 MED FILL — Fluticasone-Salmeterol Inhal Aerosol 230-21 MCG/ACT: RESPIRATORY_TRACT | 90 days supply | Qty: 36 | Fill #0 | Status: AC

## 2021-08-26 ENCOUNTER — Other Ambulatory Visit (HOSPITAL_COMMUNITY): Payer: Self-pay

## 2021-08-27 ENCOUNTER — Other Ambulatory Visit: Payer: Self-pay

## 2021-08-27 ENCOUNTER — Ambulatory Visit (INDEPENDENT_AMBULATORY_CARE_PROVIDER_SITE_OTHER): Payer: 59

## 2021-08-27 ENCOUNTER — Other Ambulatory Visit (HOSPITAL_COMMUNITY): Payer: Self-pay

## 2021-08-27 DIAGNOSIS — J4540 Moderate persistent asthma, uncomplicated: Secondary | ICD-10-CM | POA: Diagnosis not present

## 2021-08-27 MED ORDER — DIAZEPAM 10 MG PO TABS
10.0000 mg | ORAL_TABLET | Freq: Every evening | ORAL | 2 refills | Status: DC | PRN
  Filled 2021-08-27: qty 30, 30d supply, fill #0

## 2021-08-27 NOTE — Telephone Encounter (Signed)
Okay for refill?    LOV 02/2021

## 2021-08-28 ENCOUNTER — Other Ambulatory Visit (HOSPITAL_COMMUNITY): Payer: Self-pay

## 2021-08-29 ENCOUNTER — Ambulatory Visit: Payer: 59

## 2021-08-29 ENCOUNTER — Other Ambulatory Visit (HOSPITAL_COMMUNITY): Payer: Self-pay

## 2021-08-30 ENCOUNTER — Other Ambulatory Visit (HOSPITAL_COMMUNITY): Payer: Self-pay

## 2021-09-02 ENCOUNTER — Other Ambulatory Visit (HOSPITAL_COMMUNITY): Payer: Self-pay

## 2021-09-02 ENCOUNTER — Other Ambulatory Visit: Payer: Self-pay | Admitting: Allergy & Immunology

## 2021-09-02 MED FILL — Fluconazole Tab 150 MG: ORAL | 14 days supply | Qty: 14 | Fill #1 | Status: AC

## 2021-09-03 NOTE — Telephone Encounter (Signed)
Attempted to call patient to see if she needed this medication. There was no answer and no voicemail available.

## 2021-09-04 NOTE — Telephone Encounter (Signed)
Spoke with patient and she stated that it is for the thrush that she gets from her Trelegy. Is it ok to approve refill?

## 2021-09-05 ENCOUNTER — Other Ambulatory Visit (HOSPITAL_COMMUNITY): Payer: Self-pay

## 2021-09-05 MED FILL — Fluconazole Tab 150 MG: ORAL | 14 days supply | Qty: 2 | Fill #0 | Status: CN

## 2021-09-17 ENCOUNTER — Other Ambulatory Visit (HOSPITAL_COMMUNITY): Payer: Self-pay

## 2021-09-25 ENCOUNTER — Other Ambulatory Visit (HOSPITAL_COMMUNITY): Payer: Self-pay

## 2021-09-25 ENCOUNTER — Telehealth: Payer: Self-pay | Admitting: *Deleted

## 2021-09-25 NOTE — Telephone Encounter (Signed)
Appt was made with AL/NP for 1430 09-26-21. Discuss botox.

## 2021-09-25 NOTE — Telephone Encounter (Signed)
I called pt and offered her appt with AL 09/26/21 at 1430 or MM 09/26/21 at 0830, she will call back once she speaks with her Supervisor and see if she can come in.  Ok to make the appt.

## 2021-09-26 ENCOUNTER — Other Ambulatory Visit: Payer: Self-pay

## 2021-09-26 ENCOUNTER — Other Ambulatory Visit (HOSPITAL_COMMUNITY): Payer: Self-pay

## 2021-09-26 ENCOUNTER — Ambulatory Visit (INDEPENDENT_AMBULATORY_CARE_PROVIDER_SITE_OTHER): Payer: 59 | Admitting: *Deleted

## 2021-09-26 ENCOUNTER — Ambulatory Visit (INDEPENDENT_AMBULATORY_CARE_PROVIDER_SITE_OTHER): Payer: 59 | Admitting: Family Medicine

## 2021-09-26 ENCOUNTER — Encounter: Payer: Self-pay | Admitting: Family Medicine

## 2021-09-26 VITALS — BP 128/81 | HR 92 | Ht 66.0 in | Wt 180.5 lb

## 2021-09-26 DIAGNOSIS — G43109 Migraine with aura, not intractable, without status migrainosus: Secondary | ICD-10-CM

## 2021-09-26 DIAGNOSIS — J4540 Moderate persistent asthma, uncomplicated: Secondary | ICD-10-CM

## 2021-09-26 MED ORDER — AMITRIPTYLINE HCL 10 MG PO TABS
10.0000 mg | ORAL_TABLET | Freq: Every day | ORAL | 1 refills | Status: DC
  Filled 2021-09-26: qty 90, 90d supply, fill #0
  Filled 2021-11-08 – 2022-01-09 (×2): qty 90, 90d supply, fill #1

## 2021-09-26 NOTE — Progress Notes (Signed)
Chief Complaint  Patient presents with   Follow-up    Rm 2, alone. Pt here to discuss her Botox.     HISTORY OF PRESENT ILLNESS:  09/26/21 ALL:  Donna Park is a 50 y.o. female here today for follow up for migraines. She was started on topiramate and Emgality at last visit in 06/2021. She did not like side effects of topiramate and she does not feel Emgality has been effective after loading dose and three monthly doses. She does feel that it has helped some. She is having daily headaches. She has 10-12 migraines a month. Migraines can last 5-7 days. Rizatriptan helps. She is dealing with more stress. She lost her father in August. She does not sleep well. Amitriptyline 50m made her too sleepy after two doses. Trazodone gave her a really bad headache after one dose. Topiramate gave her brain fog after three doses.   Medications tried that can be used in migraine management include: Emgality (ineffective), topiramate (can not tolerate), amitriptyline(cannot tolerate), Zomig, sumatriptan, ibuprofen, ondansetron, blood pressure medications contraindicated due to hypotension  HISTORY (copied from Dr ACathren Laineprevious note)  HPI:  TKendalyn Cranfieldis a 50y.o. female here as requested by NDorothyann Peng NP for migraine. Has had migraines since a little kid, extensive family history, her mother, brother, 374of her sons all have migraines. She has headaches 25 days a month and 8 are moderatel to severe migraines that can last for 24-72 hours for over a year. Ibuprofen and goody powders help but she may be taking them daily, too much. She has lost 145 pounds. Headaches start start unilateral but can also be bilateral, pulsating/pounding/throbbing, spreads to the whole head, light/sound sensitivity, nausea, zofran to help stop vomiting, she can wake up with headaches. Imitrex makes her skin very sensitive. She sometimes has an aura. She is having blurry vision and she has loss of vision a black spot.  Headaches change and can be worse positionally with blurred vision and she can wake with them.Worsening in quality and severity.  No other focal neurologic deficits, associated symptoms, inciting events or modifiable factors.   Reviewed notes, labs and imaging from outside physicians, which showed:   From a thorough review of records, medications tried that ca be used in migraine management include: Amitriptyline(cannot tolerate), sumatriptan, ibuprofen, ondansetron, blood pressure medications contraindicated due to hypotension, zomig, topamax,  02/2021: cbc unremarkable, cmp nml, tsh 2.21, hgba1c 5.4   REVIEW OF SYSTEMS: Out of a complete 14 system review of symptoms, the patient complains only of the following symptoms, headaches, stress and all other reviewed systems are negative.   ALLERGIES: Allergies  Allergen Reactions   Cyclobenzaprine Shortness Of Breath and Other (See Comments)    Relaxed tongue    Shrimp Flavor Anaphylaxis   Sulfa Antibiotics Anaphylaxis, Hives and Swelling   Carisoprodol Hives   Metronidazole     not allergic; drug doesn't work for pt   Morphine Other (See Comments)    Feels like her head is going to "blow up"     Topiramate     Brain fog   Adhesive [Tape] Rash    Other reaction(s): SKIN - rash (skin burns) PAPER TAPE IS OK    Tramadol Itching, Nausea And Vomiting and Rash     HOME MEDICATIONS: Outpatient Medications Prior to Visit  Medication Sig Dispense Refill   acetaminophen (TYLENOL) 500 MG tablet Take by mouth.     albuterol (VENTOLIN HFA) 108 (90 Base) MCG/ACT  inhaler Inhale 1-2 puffs into the lungs every 6 (six) hours as needed for wheezing or shortness of breath. 54 g 0   Azelastine-Fluticasone 137-50 MCG/ACT SUSP Place 1 spray into both nostrils 2 (two) times daily. 23 g 5   CALCIUM PO Take 1 tablet by mouth 4 (four) times a week.     cetirizine (ZYRTEC) 10 MG tablet Take 2 tablets (20 mg total) by mouth 2 (two) times daily. 360 tablet 1    COVID-19 At Home Antigen Test (CARESTART COVID-19 HOME TEST) KIT Use as directed 4 each 0   COVID-19 At Home Antigen Test KIT USE AS DIRECTED WITHIN PACKAGE INSERT 4 kit 0   Cyanocobalamin (NASCOBAL NA) Place 1 spray into the nose every Sunday.     diazepam (VALIUM) 10 MG tablet Take 1 tablet (10 mg total) by mouth at bedtime as needed for sleep. 30 tablet 2   EPINEPHrine 0.3 mg/0.3 mL IJ SOAJ injection INJECT 0.3 MG INTO THE MUSCLE ONCE FOR 1 DOSE. 2 each 1   EPINEPHrine 0.3 mg/0.3 mL IJ SOAJ injection Inject 0.3 mg into the muscle as needed for anaphylaxis. 2 each 1   fluconazole (DIFLUCAN) 150 MG tablet Take 1 tablet (150 mg total) by mouth once a week. 2 tablet 0   fluconazole (DIFLUCAN) 150 MG tablet Take 1 tablet (150 mg total) by mouth once a week. 2 tablet 0   fluticasone-salmeterol (ADVAIR HFA) 230-21 MCG/ACT inhaler INHALE 2 PUFFS INTO THE LUNGS 2 (TWO) TIMES DAILY. 36 g 1   Fluticasone-Umeclidin-Vilant (TRELEGY ELLIPTA) 200-62.5-25 MCG/INH AEPB Inhale 1 puff into the lungs daily. 180 each 1   Galcanezumab-gnlm (EMGALITY) 120 MG/ML SOAJ Inject 120 mg into the skin every 30 (thirty) days. 1 mL 11   hydrocortisone 2.5 % ointment APPLY TO THE AFFECTED AREA(S) TWO TIMES DAILY AS NEEDED 454 g 0   Hypertonic Nasal Wash (SINUS RINSE REFILL) PACK Use one packet dissolved in water as needed. (Patient taking differently: Place 1 each into the nose in the morning and at bedtime.) 100 each 5   ibuprofen (ADVIL) 800 MG tablet Take 1 tablet (800 mg total) by mouth 3 (three) times daily as needed (pain.). 270 tablet 0   imiquimod (ALDARA) 5 % cream APPLY TOPICALLY TO AFFECTED AREA DAILY FOR 5 DAYS AS NEEDED. 24 each 0   ipratropium (ATROVENT) 0.06 % nasal spray Place 2 sprays into both nostrils 4 (four) times daily. 45 mL 3   meclizine (ANTIVERT) 25 MG tablet Take 25 mg by mouth 3 (three) times daily as needed (dizziness/vertigo).      MUCUS RELIEF ER 600 MG 12 hr tablet Take 600-1,200 mg by mouth 2  (two) times daily as needed (congestion).      Olopatadine HCl 0.2 % SOLN PLACE 1 DROP INTO BOTH EYES TWICE DAILY AS NEEDED. 7.5 mL 3   ondansetron (ZOFRAN-ODT) 4 MG disintegrating tablet PLACE 1 TABLET ONTO THE TONGUE EVERY 8 HOURS AS NEEDED FOR NAUSEA. 90 tablet 0   ondansetron (ZOFRAN-ODT) 4 MG disintegrating tablet Take 1-2 tablets (4-8 mg total) by mouth every 8 (eight) hours as needed. May take with Rizatriptan 30 tablet 3   pantoprazole (PROTONIX) 40 MG tablet TAKE 1 TABLET (40 MG TOTAL) BY MOUTH AT BEDTIME. 90 tablet 3   Pediatric Multiple Vit-C-FA (MULTIVITAMIN ANIMAL SHAPES, WITH CA/FA,) with C & FA chewable tablet Chew 2 tablets by mouth daily.     rizatriptan (MAXALT-MLT) 10 MG disintegrating tablet Dissolve 1 tablet (10 mg total) by  mouth as needed for migraine. May repeat in 2 hours if needed 9 tablet 11   rizatriptan (MAXALT-MLT) 10 MG disintegrating tablet Dissolve 1 tablet (10 mg total) by mouth as needed for migraine. May repeat in 2 hours if needed 27 tablet 4   simethicone (MYLICON) 80 MG chewable tablet Chew 80 mg by mouth every 6 (six) hours as needed for flatulence.     tezepelumab-ekko (TEZSPIRE) 210 MG/1.91ML syringe Inject 1.91 mLs (210 mg total) into the skin every 28 (twenty-eight) days. 1.91 mL 11   tiZANidine (ZANAFLEX) 4 MG tablet Take 1 tablet (4 mg total) by mouth every 8 (eight) hours as needed. 30 tablet 0   triamcinolone ointment (KENALOG) 0.1 % APPLY TO AFFECTED AREA(S) 2 TIMES A DAY 454 g 2   triamcinolone ointment (KENALOG) 0.1 % APPLY TO AFFECTED AREA(S) 2 TIMES A DAY 454 g 2   triamcinolone ointment (KENALOG) 0.1 % APPLY TO THE AFFECTED AREA(S) TWO TIMES DAILY 454 g 2   valACYclovir (VALTREX) 500 MG tablet TAKE 2 TABLETS (1,000 MG TOTAL) BY MOUTH AT BEDTIME. 180 tablet 3   Vibegron (GEMTESA) 75 MG TABS Take 1 tablet by mouth daily. 30 tablet 11   Vitamin D, Ergocalciferol, (DRISDOL) 1.25 MG (50000 UNIT) CAPS capsule TAKE 1 CAPSULE BY MOUTH ONCE A WEEK. 12  capsule 3   amitriptyline (ELAVIL) 50 MG tablet TAKE 1 TABLET (50 MG TOTAL) BY MOUTH AT BEDTIME. 30 tablet 0   methylPREDNISolone (MEDROL DOSEPAK) 4 MG TBPK tablet Take pills all together daily with food for 6 days. Take the first dose as soon as possible. Take other doses in the mornings. 6-5-4-3-2-1 21 tablet 1   tinidazole (TINDAMAX) 500 MG tablet Take 2 tablets (1,000 mg total) by mouth daily with breakfast. 10 tablet 0   topiramate (TOPAMAX) 50 MG tablet Take 1 tablet (50 mg total) by mouth at bedtime. 30 tablet 6   traZODone (DESYREL) 50 MG tablet TAKE 0.5-1 TABLET (25-50 MG TOTAL) BY MOUTH AT BEDTIME AS NEEDED FOR SLEEP. 90 tablet 0   Facility-Administered Medications Prior to Visit  Medication Dose Route Frequency Provider Last Rate Last Admin   tezepelumab-ekko (TEZSPIRE) 210 MG/1.91ML syringe 210 mg  210 mg Subcutaneous Q28 days Valentina Shaggy, MD   210 mg at 08/27/21 1701     PAST MEDICAL HISTORY: Past Medical History:  Diagnosis Date   Anxiety    Asthma    Chicken pox    Complication of anesthesia    slow to wake up from anesthesia   DJD (degenerative joint disease)    GERD (gastroesophageal reflux disease)    resolved after gastric surgery   H/O gastric bypass 2016   History of iron deficiency anemia    HSV infection    Migraines    Obese    Pneumonia    Childhood history   PONV (postoperative nausea and vomiting)    Varicose vein of leg      PAST SURGICAL HISTORY: Past Surgical History:  Procedure Laterality Date   ANKLE FRACTURE SURGERY Left 03/08/2009   BREAST SURGERY Bilateral    Cyst Removal   GASTRIC BYPASS  06/18/2015   LAPAROSCOPIC VAGINAL HYSTERECTOMY WITH SALPINGECTOMY Bilateral 05/15/2020   Procedure: LAPAROSCOPIC ASSISTED VAGINAL HYSTERECTOMY WITH SALPINGECTOMY;  Surgeon: Louretta Shorten, MD;  Location[facility];  Service: Gynecology;  Laterality: Bilateral;  need bed   LASER ABLATION     Varicose veins   TONSILLECTOMY AND  ADENOIDECTOMY  2001   TUBAL  LIGATION  12/19/1999   UPPER GI ENDOSCOPY     Uterine ablation  12/2018     FAMILY HISTORY: Family History  Problem Relation Age of Onset   COPD Mother    Heart disease Mother    Alcohol abuse Mother    Depression Mother    Drug abuse Mother    High Cholesterol Mother    High blood pressure Mother    COPD Father    Aneurysm Father    Heart disease Father    Early death Father    Diabetes Father    Drug abuse Father    AAA (abdominal aortic aneurysm) Father        cause of death at 49   Arthritis Sister    Kidney disease Sister    Asthma Neg Hx    Allergic rhinitis Neg Hx      SOCIAL HISTORY: Social History   Socioeconomic History   Marital status: Married    Spouse name: Not on file   Number of children: Not on file   Years of education: Not on file   Highest education level: Not on file  Occupational History   Not on file  Tobacco Use   Smoking status: Never   Smokeless tobacco: Never  Vaping Use   Vaping Use: Never used  Substance and Sexual Activity   Alcohol use: Yes    Comment: Social/Rare   Drug use: Never   Sexual activity: Yes    Birth control/protection: Surgical  Other Topics Concern   Not on file  Social History Narrative   Not on file   Social Determinants of Health   Financial Resource Strain: Not on file  Food Insecurity: Not on file  Transportation Needs: Not on file  Physical Activity: Not on file  Stress: Not on file  Social Connections: Not on file  Intimate Partner Violence: Not on file     PHYSICAL EXAM  Vitals:   09/26/21 1418  BP: 128/81  Pulse: 92  Weight: 180 lb 8 oz (81.9 kg)  Height: 5' 6"  (1.676 m)   Body mass index is 29.13 kg/m.  Generalized: Well developed, in no acute distress  Cardiology: normal rate and rhythm, no murmur auscultated  Respiratory: clear to auscultation bilaterally    Neurological examination  Mentation: Alert oriented to time, place, history taking.  Follows all commands speech and language fluent Cranial nerve II-XII: Pupils were equal round reactive to light. Extraocular movements were full, visual field were full on confrontational test. Facial sensation and strength were normal. Uvula tongue midline. Head turning and shoulder shrug  were normal and symmetric. Motor: The motor testing reveals 5 over 5 strength of all 4 extremities. Good symmetric motor tone is noted throughout.  Sensory: Sensory testing is intact to soft touch on all 4 extremities. No evidence of extinction is noted.  Coordination: Cerebellar testing reveals good finger-nose-finger and heel-to-shin bilaterally.  Gait and station: Gait is normal. Tandem gait is normal. Romberg is negative. No drift is seen.  Reflexes: Deep tendon reflexes are symmetric and normal bilaterally.    DIAGNOSTIC DATA (LABS, IMAGING, TESTING) - I reviewed patient records, labs, notes, testing and imaging myself where available.  Lab Results  Component Value Date   WBC 3.8 (L) 02/08/2021   HGB 12.7 02/08/2021   HCT 37.8 02/08/2021   MCV 91.9 02/08/2021   PLT 174.0 02/08/2021      Component Value Date/Time   NA 139 02/08/2021 0748   NA 140 06/29/2019  1055   K 3.9 02/08/2021 0748   CL 106 02/08/2021 0748   CO2 29 02/08/2021 0748   GLUCOSE 94 02/08/2021 0748   BUN 14 02/08/2021 0748   BUN 11 06/29/2019 1055   CREATININE 0.65 02/08/2021 0748   CALCIUM 9.0 02/08/2021 0748   CALCIUM 8.7 02/08/2021 0748   PROT 6.4 02/08/2021 0748   PROT 7.0 06/29/2019 1055   ALBUMIN 3.7 02/08/2021 0748   ALBUMIN 4.3 06/29/2019 1055   AST 24 02/08/2021 0748   ALT 25 02/08/2021 0748   ALKPHOS 46 02/08/2021 0748   BILITOT 0.5 02/08/2021 0748   BILITOT 0.5 06/29/2019 1055   GFRNONAA >60 07/08/2020 1410   GFRAA >60 07/08/2020 1410   Lab Results  Component Value Date   CHOL 153 02/08/2021   HDL 70.70 02/08/2021   LDLCALC 74 02/08/2021   TRIG 43.0 02/08/2021   CHOLHDL 2 02/08/2021   Lab Results   Component Value Date   HGBA1C 5.4 02/08/2021   Lab Results  Component Value Date   VITAMINB12 243 02/08/2021   Lab Results  Component Value Date   TSH 2.21 02/08/2021    No flowsheet data found.   No flowsheet data found.   ASSESSMENT AND PLAN  50 y.o. year old female  has a past medical history of Anxiety, Asthma, Chicken pox, Complication of anesthesia, DJD (degenerative joint disease), GERD (gastroesophageal reflux disease), H/O gastric bypass (2016), History of iron deficiency anemia, HSV infection, Migraines, Obese, Pneumonia, PONV (postoperative nausea and vomiting), and Varicose vein of leg. here with    Migraine with aura and without status migrainosus, not intractable  We will continue Emgality injections for now. We will restart amitriptyline at a lower dose of 68m daily 30 minutes before bedtime. I am hopeful this will help with insomnia and headaches. Will increase dose if well tolerated. We will check on Botox benefits. Consider Botox if amitriptyline not effective. She will continue rizatriptan. Healthy lifestyle habits encouraged. CBT advised for insomnia and stress. She will follow up in 4 months, sooner if needed.    No orders of the defined types were placed in this encounter.    Meds ordered this encounter  Medications   amitriptyline (ELAVIL) 10 MG tablet    Sig: Take 1 tablet (10 mg total) by mouth at bedtime.    Dispense:  90 tablet    Refill:  1    Order Specific Question:   Supervising Provider    Answer:   AMelvenia Beam[[[id number]]      ADebbora Presto MSN, FNP-C 09/26/2021, 3:41 PM  [facility] [address] [place] [place] [phone]

## 2021-09-26 NOTE — Patient Instructions (Addendum)
Below is our plan:  We will continue Emgality injections. We will restart amitriptyline at 10mg  30 minutes before bedtime. We will wait 4-6 weeks to determine effectiveness. I will check Botox benefits. Consider cognitive behavioral therapy for insomnia.   Please make sure you are staying well hydrated. I recommend 50-60 ounces daily. Well balanced diet and regular exercise encouraged. Consistent sleep schedule with 6-8 hours recommended.   Please continue follow up with care team as directed.   Follow up with me in 3-4 months   You may receive a survey regarding today's visit. I encourage you to leave honest feed back as I do use this information to improve patient care. Thank you for seeing me today!

## 2021-10-01 ENCOUNTER — Ambulatory Visit: Payer: 59 | Admitting: Neurology

## 2021-10-07 ENCOUNTER — Telehealth: Payer: Self-pay | Admitting: Family Medicine

## 2021-10-07 NOTE — Telephone Encounter (Signed)
I called [facility] @ [phone] and spoke with Elta Guadeloupe to start Botox PA. CPT [id number], [id number]. Dx: Y65.993. Requesting 155 units every 12 weeks. Elta Guadeloupe advised me to fax clinicals to [phone]. Pending #[id number].

## 2021-10-09 ENCOUNTER — Telehealth (INDEPENDENT_AMBULATORY_CARE_PROVIDER_SITE_OTHER): Payer: 59 | Admitting: Neurology

## 2021-10-09 DIAGNOSIS — G43709 Chronic migraine without aura, not intractable, without status migrainosus: Secondary | ICD-10-CM

## 2021-10-09 NOTE — Progress Notes (Addendum)
[facility]    Provider:  Dr Jaynee Eagles Requesting Provider: Dorothyann Peng, NP Primary Care Provider:  Dorothyann Peng, NP  CC:  migraines  Virtual Visit via Video Note  I connected with Donna Park on 10/15/21 at  1:30 PM EDT by a video enabled telemedicine application and verified that I am speaking with the correct person using two identifiers.  Location: Patient: Her place of work Facilities manager, [employer] [place] Provider: office [address] [place], [place]   I discussed the limitations of evaluation and management by telemedicine and the availability of in person appointments. The patient expressed understanding and agreed to proceed.  Follow Up Instructions:    I discussed the assessment and treatment plan with the patient. The patient was provided an opportunity to ask questions and all were answered. The patient agreed with the plan and demonstrated an understanding of the instructions.   The patient was advised to call back or seek an in-person evaluation if the symptoms worsen or if the condition fails to improve as anticipated.  I provided 20 minutes of non-face-to-face time during this encounter.   Melvenia Beam, MD   10/09/2021: I gave [name] that are pulsating, pounding, throbbing, unilateral with nausea, phonophobia and photophobia and can last upwards of 24 hours.  No aura.  No medication overuse.  Ongoing for over a year at this frequency, severity.  Affecting her life.  At this time we discussed trying a different CGRP or continuing to Botox, she would like to try Botox.  HPI:  Donna Park is a 50 y.o. female here as requested by Dorothyann Peng, NP for migraine. Has had migraines since a little kid, extensive family history, her mother, brother, 83 of her sons all have  migraines. She has headaches 25 days a month and 8 are moderatel to severe migraines that can last for 24-72 hours for over a year. Ibuprofen and goody powders help but she may be taking them daily, too much. She has lost 145 pounds. Headaches start start unilateral but can also be bilateral, pulsating/pounding/throbbing, spreads to the whole head, light/sound sensitivity, nausea, zofran to help stop vomiting, she can wake up with headaches. Imitrex makes her skin very sensitive. She sometimes has an aura. She is having blurry vision and she has loss of vision a black spot. Headaches change and can be worse positionally with blurred vision and she can wake with them.Worsening in quality and severity.  No other focal neurologic deficits, associated symptoms, inciting events or modifiable factors.  Reviewed notes, labs and imaging from outside physicians, which showed:  From a thorough review of records, medications tried that ca be used in migraine management include: Amitriptyline(cannot tolerate), sumatriptan, ibuprofen, ondansetron, blood pressure medications contraindicated due to hypotension, zomig, topamax,  02/2021: cbc unremarkable, cmp nml, tsh 2.21, hgba1c 5.4  Review of Systems: Patient complains of symptoms per HPI as well as the following symptoms: migraines . Pertinent negatives and positives per HPI. All others negative    Social History   Socioeconomic History   Marital status: Married    Spouse name: Not on file   Number of children: Not on file   Years of education: Not on file   Highest education level: Not on file  Occupational History   Not on file  Tobacco Use  Smoking status: Never   Smokeless tobacco: Never  Vaping Use   Vaping Use: Never used  Substance and Sexual Activity   Alcohol use: Yes    Comment: Social/Rare   Drug use: Never   Sexual activity: Yes    Birth control/protection: Surgical  Other Topics Concern   Not on file  Social History Narrative    Not on file   Social Determinants of Health   Financial Resource Strain: Not on file  Food Insecurity: Not on file  Transportation Needs: Not on file  Physical Activity: Not on file  Stress: Not on file  Social Connections: Not on file  Intimate Partner Violence: Not on file    Family History  Problem Relation Age of Onset   COPD Mother    Heart disease Mother    Alcohol abuse Mother    Depression Mother    Drug abuse Mother    High Cholesterol Mother    High blood pressure Mother    COPD Father    Aneurysm Father    Heart disease Father    Early death Father    Diabetes Father    Drug abuse Father    AAA (abdominal aortic aneurysm) Father        cause of death at 24   Arthritis Sister    Kidney disease Sister    Asthma Neg Hx    Allergic rhinitis Neg Hx     Past Medical History:  Diagnosis Date   Anxiety    Asthma    Chicken pox    Complication of anesthesia    slow to wake up from anesthesia   DJD (degenerative joint disease)    GERD (gastroesophageal reflux disease)    resolved after gastric surgery   H/O gastric bypass 2016   History of iron deficiency anemia    HSV infection    Migraines    Obese    Pneumonia    Childhood history   PONV (postoperative nausea and vomiting)    Varicose vein of leg     Patient Active Problem List   Diagnosis Date Noted   Chronic migraine without aura without status migrainosus, not intractable 06/14/2021   S/P laparoscopic assisted vaginal hysterectomy (LAVH) 05/15/2020   Migraines    GERD (gastroesophageal reflux disease)    Asthma    Genital warts    Seasonal and perennial allergic rhinitis 02/10/2019   Moderate persistent asthma, uncomplicated 42/70/6237   Anaphylactic shock due to adverse food reaction 02/10/2019   Atopic dermatitis 02/10/2019   H/O gastric bypass 2016    Past Surgical History:  Procedure Laterality Date   ANKLE FRACTURE SURGERY Left 03/08/2009   BREAST SURGERY Bilateral    Cyst  Removal   GASTRIC BYPASS  06/18/2015   LAPAROSCOPIC VAGINAL HYSTERECTOMY WITH SALPINGECTOMY Bilateral 05/15/2020   Procedure: LAPAROSCOPIC ASSISTED VAGINAL HYSTERECTOMY WITH SALPINGECTOMY;  Surgeon: Louretta Shorten, MD;  Location[facility];  Service: Gynecology;  Laterality: Bilateral;  need bed   LASER ABLATION     Varicose veins   TONSILLECTOMY AND ADENOIDECTOMY  2001   TUBAL LIGATION  12/19/1999   UPPER GI ENDOSCOPY     Uterine ablation  12/2018    Current Outpatient Medications  Medication Sig Dispense Refill   acetaminophen (TYLENOL) 500 MG tablet Take by mouth.     albuterol (VENTOLIN HFA) 108 (90 Base) MCG/ACT inhaler Inhale 1-2 puffs into the lungs every 6 (six) hours as needed for wheezing or shortness of breath. [place]  g 0   amitriptyline (ELAVIL) 10 MG tablet Take 1 tablet (10 mg total) by mouth at bedtime. 90 tablet 1   Azelastine-Fluticasone 137-50 MCG/ACT SUSP Place 1 spray into both nostrils 2 (two) times daily. 23 g 5   CALCIUM PO Take 1 tablet by mouth 4 (four) times a week.     cetirizine (ZYRTEC) 10 MG tablet Take 2 tablets (20 mg total) by mouth 2 (two) times daily. 360 tablet 1   COVID-19 At Home Antigen Test (CARESTART COVID-19 HOME TEST) KIT Use as directed 4 each 0   COVID-19 At Home Antigen Test KIT USE AS DIRECTED WITHIN PACKAGE INSERT 4 kit 0   Cyanocobalamin (NASCOBAL NA) Place 1 spray into the nose every Sunday.     diazepam (VALIUM) 10 MG tablet Take 1 tablet (10 mg total) by mouth at bedtime as needed for sleep. 30 tablet 2   EPINEPHrine 0.3 mg/0.3 mL IJ SOAJ injection INJECT 0.3 MG INTO THE MUSCLE ONCE FOR 1 DOSE. 2 each 1   EPINEPHrine 0.3 mg/0.3 mL IJ SOAJ injection Inject 0.3 mg into the muscle as needed for anaphylaxis. 2 each 1   fluconazole (DIFLUCAN) 150 MG tablet Take 1 tablet (150 mg total) by mouth once a week. 2 tablet 0   fluconazole (DIFLUCAN) 150 MG tablet Take 1 tablet (150 mg total) by mouth once a week. 2 tablet 0    fluticasone-salmeterol (ADVAIR HFA) 230-21 MCG/ACT inhaler INHALE 2 PUFFS INTO THE LUNGS 2 (TWO) TIMES DAILY. 36 g 1   Fluticasone-Umeclidin-Vilant (TRELEGY ELLIPTA) 200-62.5-25 MCG/INH AEPB Inhale 1 puff into the lungs daily. 180 each 1   Galcanezumab-gnlm (EMGALITY) 120 MG/ML SOAJ Inject 120 mg into the skin every 30 (thirty) days. 1 mL 11   hydrocortisone 2.5 % ointment APPLY TO THE AFFECTED AREA(S) TWO TIMES DAILY AS NEEDED 454 g 0   Hypertonic Nasal Wash (SINUS RINSE REFILL) PACK Use one packet dissolved in water as needed. (Patient taking differently: Place 1 each into the nose in the morning and at bedtime.) 100 each 5   ibuprofen (ADVIL) 800 MG tablet Take 1 tablet (800 mg total) by mouth 3 (three) times daily as needed (pain.). 270 tablet 0   imiquimod (ALDARA) 5 % cream APPLY TOPICALLY TO AFFECTED AREA DAILY FOR 5 DAYS AS NEEDED. 24 each 0   ipratropium (ATROVENT) 0.06 % nasal spray Place 2 sprays into both nostrils 4 (four) times daily. 45 mL 3   meclizine (ANTIVERT) 25 MG tablet Take 25 mg by mouth 3 (three) times daily as needed (dizziness/vertigo).      MUCUS RELIEF ER 600 MG 12 hr tablet Take 600-1,200 mg by mouth 2 (two) times daily as needed (congestion).      Olopatadine HCl 0.2 % SOLN PLACE 1 DROP INTO BOTH EYES TWICE DAILY AS NEEDED. 7.5 mL 3   ondansetron (ZOFRAN-ODT) 4 MG disintegrating tablet PLACE 1 TABLET ONTO THE TONGUE EVERY 8 HOURS AS NEEDED FOR NAUSEA. 90 tablet 0   ondansetron (ZOFRAN-ODT) 4 MG disintegrating tablet Take 1-2 tablets (4-8 mg total) by mouth every 8 (eight) hours as needed. May take with Rizatriptan 30 tablet 3   pantoprazole (PROTONIX) 40 MG tablet TAKE 1 TABLET (40 MG TOTAL) BY MOUTH AT BEDTIME. 90 tablet 3   Pediatric Multiple Vit-C-FA (MULTIVITAMIN ANIMAL SHAPES, WITH CA/FA,) with C & FA chewable tablet Chew 2 tablets by mouth daily.     rizatriptan (MAXALT-MLT) 10 MG disintegrating tablet Dissolve 1 tablet (10 mg total) by mouth  as needed for migraine.  May repeat in 2 hours if needed 9 tablet 11   rizatriptan (MAXALT-MLT) 10 MG disintegrating tablet Dissolve 1 tablet (10 mg total) by mouth as needed for migraine. May repeat in 2 hours if needed 27 tablet 4   simethicone (MYLICON) 80 MG chewable tablet Chew 80 mg by mouth every 6 (six) hours as needed for flatulence.     tezepelumab-ekko (TEZSPIRE) 210 MG/1.91ML syringe Inject 1.91 mLs (210 mg total) into the skin every 28 (twenty-eight) days. 1.91 mL 11   tiZANidine (ZANAFLEX) 4 MG tablet Take 1 tablet (4 mg total) by mouth every 8 (eight) hours as needed. 30 tablet 0   triamcinolone ointment (KENALOG) 0.1 % APPLY TO AFFECTED AREA(S) 2 TIMES A DAY 454 g 2   triamcinolone ointment (KENALOG) 0.1 % APPLY TO AFFECTED AREA(S) 2 TIMES A DAY 454 g 2   triamcinolone ointment (KENALOG) 0.1 % APPLY TO THE AFFECTED AREA(S) TWO TIMES DAILY 454 g 2   valACYclovir (VALTREX) 500 MG tablet TAKE 2 TABLETS (1,000 MG TOTAL) BY MOUTH AT BEDTIME. 180 tablet 3   Vibegron (GEMTESA) 75 MG TABS Take 1 tablet by mouth daily. 30 tablet 11   Vitamin D, Ergocalciferol, (DRISDOL) 1.25 MG (50000 UNIT) CAPS capsule TAKE 1 CAPSULE BY MOUTH ONCE A WEEK. 12 capsule 3   Current Facility-Administered Medications  Medication Dose Route Frequency Provider Last Rate Last Admin   tezepelumab-ekko (TEZSPIRE) 210 MG/1.91ML syringe 210 mg  210 mg Subcutaneous Q28 days Valentina Shaggy, MD   210 mg at 09/26/21 1853    Allergies as of 10/09/2021 - Review Complete 09/26/2021  Allergen Reaction Noted   Cyclobenzaprine Shortness Of Breath and Other (See Comments) 07/09/2012   Shrimp flavor Anaphylaxis 09/26/2021   Sulfa antibiotics Anaphylaxis, Hives, and Swelling 05/25/2007   Carisoprodol Hives 12/15/2012   Metronidazole  07/03/2021   Morphine Other (See Comments) 06/18/2015   Topiramate  07/03/2021   Adhesive [tape] Rash 04/05/2015   Tramadol Itching, Nausea And Vomiting, and Rash 09/26/2021    Vitals: There were no vitals  taken for this visit. Last Weight:  Wt Readings from Last 1 Encounters:  09/26/21 180 lb 8 oz (81.9 kg)   Last Height:   Ht Readings from Last 1 Encounters:  09/26/21 5' 6"  (1.676 m)    Physical exam: Exam: Gen: NAD, conversant      CV: attempted, Could not perform over Web Video. Denies palpitations or chest pain or SOB. VS: Breathing at a normal rate. Weight appears within normal limit. Not febrile. Eyes: Conjunctivae clear without exudates or hemorrhage  Neuro: Detailed Neurologic Exam  Speech:    Speech is normal; fluent and spontaneous with normal comprehension.  Cognition:    The patient is oriented to person, place, and time;     recent and remote memory intact;     language fluent;     normal attention, concentration,     fund of knowledge Cranial Nerves:    The pupils are equal, round, and reactive to light. Attempted, Cannot perform fundoscopic exam. Visual fields are full to finger confrontation. Extraocular movements are intact.  The face is symmetric with normal sensation. The palate elevates in the midline. Hearing intact. Voice is normal. Shoulder shrug is normal. The tongue has normal motion without fasciculations.   Coordination:    Normal finger to nose  Gait:    Normal native gait  Motor Observation:   no involuntary movements noted. Tone:    Appears normal  Posture:    Posture is normal. normal erect    Strength:    Strength is anti-gravity and symmetric in the upper and lower limbs.      Sensation: intact to LT     Reflex Exam:     Assessment/Plan:  Patient with chronic migraines.  Emgality and topiramate still not helping, she has been approved for Botox, has an upcoming appointment this week.  Acute/emergent: Stop Imitrex. Try Rizatriptan: Please take one tablet at the onset of your headache. If it does not improve the symptoms please take one additional tablet. Do not take more then 2 tablets in 24hrs. Do not take use more then 2 to 3  times in a week.May take Rizatriptan with ondansetron. Other options for acute/emergency meds include Ubrelvy, Nurtec and many other options MRI brain: normal  Discussed: There is increased risk for stroke in women with migraine with aura and a contraindication for the combined contraceptive pill for use by women who have migraine with aura. The risk for women with migraine without aura is lower. However other risk factors like smoking are far more likely to increase stroke risk than migraine. There is a recommendation for no smoking and for the use of OCPs without estrogen such as progestogen only pills particularly for women with migraine with aura.Marland Kitchen People who have migraine headaches with auras may be 3 times more likely to have a stroke caused by a blood clot, compared to migraine patients who don't see auras. Women who take hormone-replacement therapy may be 30 percent more likely to suffer a clot-based stroke than women not taking medication containing estrogen. Other risk factors like smoking and high blood pressure may be  much more important.  No orders of the defined types were placed in this encounter.   No orders of the defined types were placed in this encounter.   Cc: Dorothyann Peng, NP,  [place], [place], NP  Sarina Ill, MD  [facility] [address] [place], [place] [address]  Phone [phone] Fax [phone]

## 2021-10-14 ENCOUNTER — Other Ambulatory Visit (HOSPITAL_COMMUNITY): Payer: Self-pay

## 2021-10-14 NOTE — Telephone Encounter (Signed)
Received approval from [facility]. PA # [id number] [phone] (10/07/21- 04/06/22). Patient is scheduled for Botox samples with Dr. Jaynee Eagles 11/10.

## 2021-10-15 ENCOUNTER — Telehealth: Payer: Self-pay | Admitting: Neurology

## 2021-10-15 NOTE — Telephone Encounter (Signed)
It appears as though patient has been approved for Botox form [employer].  If this is the case then lets not use samples on Thursday, we can bill her insurance for Botox and charge a procedure code.  Please let her know this will be actually cheaper for her because she can submit the co-pay for the appointment and for the Botox up to the savings program and recoup her money.  If I use samples she will have to pay for an office appointment which is not reimbursable by the Botox savings program.

## 2021-10-17 ENCOUNTER — Ambulatory Visit (INDEPENDENT_AMBULATORY_CARE_PROVIDER_SITE_OTHER): Payer: 59 | Admitting: Neurology

## 2021-10-17 ENCOUNTER — Other Ambulatory Visit: Payer: Self-pay | Admitting: Pharmacist

## 2021-10-17 ENCOUNTER — Other Ambulatory Visit: Payer: Self-pay

## 2021-10-17 ENCOUNTER — Other Ambulatory Visit (HOSPITAL_COMMUNITY): Payer: Self-pay

## 2021-10-17 ENCOUNTER — Other Ambulatory Visit: Payer: Self-pay | Admitting: Family

## 2021-10-17 ENCOUNTER — Other Ambulatory Visit: Payer: Self-pay | Admitting: Adult Health

## 2021-10-17 DIAGNOSIS — G43709 Chronic migraine without aura, not intractable, without status migrainosus: Secondary | ICD-10-CM

## 2021-10-17 DIAGNOSIS — G43919 Migraine, unspecified, intractable, without status migrainosus: Secondary | ICD-10-CM

## 2021-10-17 MED ORDER — IBUPROFEN 800 MG PO TABS
800.0000 mg | ORAL_TABLET | Freq: Three times a day (TID) | ORAL | 0 refills | Status: DC | PRN
  Filled 2021-10-24 – 2022-01-23 (×2): qty 270, 90d supply, fill #0

## 2021-10-17 MED ORDER — FLUTICASONE PROPIONATE 50 MCG/ACT NA SUSP
2.0000 | Freq: Every day | NASAL | 5 refills | Status: DC
  Filled 2021-10-17: qty 16, 30d supply, fill #0

## 2021-10-17 MED ORDER — IMIQUIMOD 5 % EX CREA
TOPICAL_CREAM | CUTANEOUS | 0 refills | Status: AC
  Filled 2021-10-24: qty 24, 90d supply, fill #0
  Filled 2021-11-08: qty 24, 24d supply, fill #0

## 2021-10-17 MED ORDER — CARESTART COVID-19 HOME TEST VI KIT
PACK | 0 refills | Status: DC
  Filled 2021-10-17: qty 4, 4d supply, fill #0

## 2021-10-17 MED ORDER — AZELASTINE HCL 0.1 % NA SOLN
2.0000 | Freq: Two times a day (BID) | NASAL | 5 refills | Status: DC
Start: 2021-10-17 — End: 2022-11-13
  Filled 2021-10-17: qty 30, 25d supply, fill #0

## 2021-10-17 MED FILL — Ergocalciferol Cap 1.25 MG (50000 Unit): ORAL | 84 days supply | Qty: 12 | Fill #0 | Status: AC

## 2021-10-17 MED FILL — Pantoprazole Sodium EC Tab 40 MG (Base Equiv): ORAL | 90 days supply | Qty: 90 | Fill #1 | Status: AC

## 2021-10-17 MED FILL — Valacyclovir HCl Tab 500 MG: ORAL | 90 days supply | Qty: 180 | Fill #2 | Status: AC

## 2021-10-17 NOTE — Progress Notes (Signed)
Patient signed consent  Botox- 200 units x 1 vial Lot: [id number] Expiration: 03/2024 NDC: 9872-1587-27  Dx: M18.485 B/B

## 2021-10-17 NOTE — Progress Notes (Signed)
Consent Form Botulism Toxin Injection For Chronic Migraine  10/17/2021: First botox. +a.   Reviewed orally with patient, additionally signature is on file:  Botulism toxin has been approved by the Federal drug administration for treatment of chronic migraine. Botulism toxin does not cure chronic migraine and it may not be effective in some patients.  The administration of botulism toxin is accomplished by injecting a small amount of toxin into the muscles of the neck and head. Dosage must be titrated for each individual. Any benefits resulting from botulism toxin tend to wear off after 3 months with a repeat injection required if benefit is to be maintained. Injections are usually done every 3-4 months with maximum effect peak achieved by about 2 or 3 weeks. Botulism toxin is expensive and you should be sure of what costs you will incur resulting from the injection.  The side effects of botulism toxin use for chronic migraine may include:   -Transient, and usually mild, facial weakness with facial injections  -Transient, and usually mild, head or neck weakness with head/neck injections  -Reduction or loss of forehead facial animation due to forehead muscle weakness  -Eyelid drooping  -Dry eye  -Pain at the site of injection or bruising at the site of injection  -Double vision  -Potential unknown long term risks  Contraindications: You should not have Botox if you are pregnant, nursing, allergic to albumin, have an infection, skin condition, or muscle weakness at the site of the injection, or have myasthenia gravis, Lambert-Eaton syndrome, or ALS.  It is also possible that as with any injection, there may be an allergic reaction or no effect from the medication. Reduced effectiveness after repeated injections is sometimes seen and rarely infection at the injection site may occur. All care will be taken to prevent these side effects. If therapy is given over a long time, atrophy and wasting in  the muscle injected may occur. Occasionally the patient's become refractory to treatment because they develop antibodies to the toxin. In this event, therapy needs to be modified.  I have read the above information and consent to the administration of botulism toxin.    BOTOX PROCEDURE NOTE FOR MIGRAINE HEADACHE    Contraindications and precautions discussed with patient(above). Aseptic procedure was observed and patient tolerated procedure. Procedure performed by Dr. Georgia Dom  The condition has existed for more than 6 months, and pt does not have a diagnosis of ALS, Myasthenia Gravis or Lambert-Eaton Syndrome.  Risks and benefits of injections discussed and pt agrees to proceed with the procedure.  Written consent obtained  These injections are medically necessary. Pt  receives good benefits from these injections. These injections do not cause sedations or hallucinations which the oral therapies may cause.  Description of procedure:  The patient was placed in a sitting position. The standard protocol was used for Botox as follows, with 5 units of Botox injected at each site:   -Procerus muscle, midline injection  -Corrugator muscle, bilateral injection  -Frontalis muscle, bilateral injection, with 2 sites each side, medial injection was performed in the upper one third of the frontalis muscle, in the region vertical from the medial inferior edge of the superior orbital rim. The lateral injection was again in the upper one third of the forehead vertically above the lateral limbus of the cornea, 1.5 cm lateral to the medial injection site.  -Temporalis muscle injection, 4 sites, bilaterally. The first injection was 3 cm above the tragus of the ear, second injection site was  1.5 cm to 3 cm up from the first injection site in line with the tragus of the ear. The third injection site was 1.5-3 cm forward between the first 2 injection sites. The fourth injection site was 1.5 cm posterior to  the second injection site.   -Occipitalis muscle injection, 3 sites, bilaterally. The first injection was done one half way between the occipital protuberance and the tip of the mastoid process behind the ear. The second injection site was done lateral and superior to the first, 1 fingerbreadth from the first injection. The third injection site was 1 fingerbreadth superiorly and medially from the first injection site.  -Cervical paraspinal muscle injection, 2 sites, bilateral knee first injection site was 1 cm from the midline of the cervical spine, 3 cm inferior to the lower border of the occipital protuberance. The second injection site was 1.5 cm superiorly and laterally to the first injection site.  -Trapezius muscle injection was performed at 3 sites, bilaterally. The first injection site was in the upper trapezius muscle halfway between the inflection point of the neck, and the acromion. The second injection site was one half way between the acromion and the first injection site. The third injection was done between the first injection site and the inflection point of the neck.   Will return for repeat injection in 3 months.   200 units of Botox was used, any Botox not injected was wasted. The patient tolerated the procedure well, there were no complications of the above procedure.

## 2021-10-17 NOTE — Telephone Encounter (Signed)
Donna Park messaged pt back in other encounter.

## 2021-10-17 NOTE — Addendum Note (Signed)
Addended by: Clovis Cao A on: 10/17/2021 02:23 PM   Modules accepted: Orders

## 2021-10-18 ENCOUNTER — Other Ambulatory Visit (HOSPITAL_COMMUNITY): Payer: Self-pay

## 2021-10-19 NOTE — Telephone Encounter (Signed)
Ok to refill albuterol

## 2021-10-21 ENCOUNTER — Other Ambulatory Visit (HOSPITAL_COMMUNITY): Payer: Self-pay

## 2021-10-21 MED ORDER — ALBUTEROL SULFATE HFA 108 (90 BASE) MCG/ACT IN AERS
1.0000 | INHALATION_SPRAY | Freq: Four times a day (QID) | RESPIRATORY_TRACT | 0 refills | Status: DC | PRN
  Filled 2021-10-21: qty 54, 75d supply, fill #0

## 2021-10-24 ENCOUNTER — Other Ambulatory Visit (HOSPITAL_COMMUNITY): Payer: Self-pay

## 2021-10-24 ENCOUNTER — Other Ambulatory Visit: Payer: Self-pay

## 2021-10-24 ENCOUNTER — Ambulatory Visit (INDEPENDENT_AMBULATORY_CARE_PROVIDER_SITE_OTHER): Payer: 59 | Admitting: *Deleted

## 2021-10-24 DIAGNOSIS — J4540 Moderate persistent asthma, uncomplicated: Secondary | ICD-10-CM

## 2021-10-24 DIAGNOSIS — J4550 Severe persistent asthma, uncomplicated: Secondary | ICD-10-CM

## 2021-10-25 ENCOUNTER — Other Ambulatory Visit (HOSPITAL_COMMUNITY): Payer: Self-pay

## 2021-11-04 ENCOUNTER — Other Ambulatory Visit (HOSPITAL_COMMUNITY): Payer: Self-pay

## 2021-11-05 ENCOUNTER — Telehealth: Payer: Self-pay | Admitting: Allergy & Immunology

## 2021-11-05 ENCOUNTER — Other Ambulatory Visit (HOSPITAL_COMMUNITY): Payer: Self-pay

## 2021-11-05 MED ORDER — ALBUTEROL SULFATE HFA 108 (90 BASE) MCG/ACT IN AERS
1.0000 | INHALATION_SPRAY | Freq: Four times a day (QID) | RESPIRATORY_TRACT | 1 refills | Status: DC | PRN
  Filled 2021-11-05: qty 54, 75d supply, fill #0

## 2021-11-05 MED ORDER — PREDNISONE 10 MG PO TABS
ORAL_TABLET | ORAL | 0 refills | Status: AC
  Filled 2021-11-05: qty 36, 9d supply, fill #0

## 2021-11-05 NOTE — Telephone Encounter (Signed)
Patient called reporting an asthma exacerbation with frequent albuterol use for the past few days.  She thinks this was triggered by a viral URI.  She does not require prednisone very often at all, so I sent in a course of prednisone as well as a refill of her albuterol.  Salvatore Marvel, MD [facility]

## 2021-11-07 ENCOUNTER — Ambulatory Visit: Payer: 59 | Admitting: Allergy & Immunology

## 2021-11-07 ENCOUNTER — Other Ambulatory Visit: Payer: Self-pay

## 2021-11-07 ENCOUNTER — Ambulatory Visit (INDEPENDENT_AMBULATORY_CARE_PROVIDER_SITE_OTHER): Payer: 59 | Admitting: Allergy & Immunology

## 2021-11-07 ENCOUNTER — Other Ambulatory Visit (HOSPITAL_COMMUNITY): Payer: Self-pay

## 2021-11-07 VITALS — BP 142/80 | HR 107 | Temp 97.9°F | Ht 66.0 in | Wt 187.2 lb

## 2021-11-07 DIAGNOSIS — J4541 Moderate persistent asthma with (acute) exacerbation: Secondary | ICD-10-CM | POA: Diagnosis not present

## 2021-11-07 DIAGNOSIS — T7800XD Anaphylactic reaction due to unspecified food, subsequent encounter: Secondary | ICD-10-CM

## 2021-11-07 DIAGNOSIS — J302 Other seasonal allergic rhinitis: Secondary | ICD-10-CM | POA: Diagnosis not present

## 2021-11-07 DIAGNOSIS — J3089 Other allergic rhinitis: Secondary | ICD-10-CM

## 2021-11-07 DIAGNOSIS — L508 Other urticaria: Secondary | ICD-10-CM | POA: Diagnosis not present

## 2021-11-07 DIAGNOSIS — J4540 Moderate persistent asthma, uncomplicated: Secondary | ICD-10-CM

## 2021-11-07 MED ORDER — TRIAMCINOLONE ACETONIDE 0.1 % EX OINT
TOPICAL_OINTMENT | CUTANEOUS | 2 refills | Status: AC
  Filled 2021-11-07: qty 454, 90d supply, fill #0
  Filled 2022-07-26: qty 454, 90d supply, fill #1
  Filled 2022-10-20: qty 454, 90d supply, fill #2

## 2021-11-07 MED ORDER — ALBUTEROL SULFATE HFA 108 (90 BASE) MCG/ACT IN AERS
1.0000 | INHALATION_SPRAY | Freq: Four times a day (QID) | RESPIRATORY_TRACT | 0 refills | Status: DC | PRN
  Filled 2021-11-07 – 2022-05-01 (×2): qty 54, 75d supply, fill #0

## 2021-11-07 MED ORDER — TRELEGY ELLIPTA 200-62.5-25 MCG/ACT IN AEPB
1.0000 | INHALATION_SPRAY | Freq: Every day | RESPIRATORY_TRACT | 5 refills | Status: DC
Start: 2021-11-07 — End: 2022-09-26
  Filled 2021-11-07 – 2021-11-08 (×2): qty 180, 90d supply, fill #0
  Filled 2022-05-01: qty 180, 90d supply, fill #1

## 2021-11-07 MED ORDER — IPRATROPIUM BROMIDE 0.06 % NA SOLN
2.0000 | Freq: Four times a day (QID) | NASAL | 3 refills | Status: DC
Start: 2021-11-07 — End: 2023-06-25
  Filled 2021-11-07: qty 45, 90d supply, fill #0

## 2021-11-07 MED ORDER — METHYLPREDNISOLONE ACETATE 80 MG/ML IJ SUSP
80.0000 mg | Freq: Once | INTRAMUSCULAR | Status: AC
  Administered 2021-11-07: 80 mg via INTRAMUSCULAR

## 2021-11-07 MED ORDER — CETIRIZINE HCL 10 MG PO TABS
20.0000 mg | ORAL_TABLET | Freq: Two times a day (BID) | ORAL | 1 refills | Status: AC
  Filled 2021-11-07: qty 360, 90d supply, fill #0

## 2021-11-07 NOTE — Progress Notes (Signed)
FOLLOW UP  Date of Service/Encounter:  11/07/21   Assessment:   Moderate persistent asthma with acute exacerbation   Seasonal and perennial allergic rhinitis (grass, trees, molds, cat, dust mite, cockroach) - s/p five years of allergen immunotherapy)   Anaphylactic shock due to food (shrimp) - tolerates other shellfish   Itching   S/p hysterectomy (June 2021)   S/p gastric bypass (2016)     Asthma Reportables:  Severity: moderate persistent  Risk: high Control: well controlled   Plan/Recommendations:   1. Moderate persistent asthma, uncomplicated - Lung testing looked AWESOME. - Let's give you Depo 80mg  to help speed this process along. - Complete your prednisone course that you are on already.  - Call me with any issues.  - Daily controller medication(s): Trelegy 200/62.5/25 one puff once daily and Tezspire every four weeks. Will discuss with Dr. Ernst Bowler once he returns about whether he thinks that you should continue Tezspire due to itching and occasional hives - Prior to physical activity: albuterol 2 puffs 10-15 minutes before physical activity. - Rescue medications: albuterol 4 puffs every 4-6 hours as needed - Changes during respiratory infections or worsening symptoms: ADD ON Arnuity 287mcg to 1 puff twice daily for ONE TO TWO WEEKS. - Asthma control goals:  * Full participation in all desired activities (may need albuterol before activity) * Albuterol use two time or less a week on average (not counting use with activity) * Cough interfering with sleep two time or less a month * Oral steroids no more than once a year * No hospitalizations  2. Seasonal and perennial allergic rhinitis - Continue with cetirizine 20mg  twice daily. - Continue ipratropium bromide nasal spray 2 sprays each nostril twice a day as needed. Do not use on the same day that you use Dymista - Continue with Dymista one sprays per nostril up to twice daily. - Continue with nasal saline  rinses.   3. Anaphylactic shock due to food - Avoid shrimp. - EpiPen is up to date.   4. Return in about 6 months (around 05/08/2022).    Subjective:   Donna Park is a 50 y.o. female presenting today for follow up of  Chief Complaint  Patient presents with   Cough    Started taking Prednazone due to coughing. Body aches bc of coughing. Pulse ox has been dropping below 86. ACT 11.    Rada Hay has a history of the following: Patient Active Problem List   Diagnosis Date Noted   Chronic migraine without aura without status migrainosus, not intractable 06/14/2021   S/P laparoscopic assisted vaginal hysterectomy (LAVH) 05/15/2020   Migraines    GERD (gastroesophageal reflux disease)    Asthma    Genital warts    Seasonal and perennial allergic rhinitis 02/10/2019   Moderate persistent asthma, uncomplicated 62/95/2841   Anaphylactic shock due to adverse food reaction 02/10/2019   Atopic dermatitis 02/10/2019   H/O gastric bypass 2016    History obtained from: chart review and patient.  Donna Park is a 50 y.o. female presenting for a follow up visit.  She was last seen in July 2022.  At that time, she was doing well on Trelegy 200 mcg 1 puff once daily as well as Tezspire every month.  For her rhinitis, we continue with Zyrtec 20 mg twice daily as well as ipratropium bromide as needed and Dymista as needed.  She continue to avoid shrimp.  After the last visit, we had a discussion about changing to  Xolair from [employer].  However, she felt that the latter was working better to control her asthma and wanted to continue with that.  Her hives are under better control when I talk to her.  Since the last visit, she has done well.  Asthma/Respiratory Symptom History: She remains on the Trelegy 1 puff once daily.  She has been using her albuterol more recently over the last week.  I sent in some prednisone for her a couple of days ago which she did start.  She feels like she is  getting a little bit better, but still 100%.  She is wondering if she needs steroid injection to help with this.  Prior to this, her asthma is under excellent control.  She remains up-to-date on her Tezspire monthly.  She has not been to the emergency room nor she needed to go to the urgent care for her symptoms.  Allergic Rhinitis Symptom History: She remains on Flonase and Astelin.  She was on Dymista in the past, but her insurance was not covering it.  She has not needed antibiotics.  She has not had a sinus infection.  Food Allergy Symptom History: She continues to avoid shrimp.  She has had no accidental exposures. Her EpiPen is up-to-date.  Skin Symptom History: Urticaria was previously not well controlled, but she is doing much better now.  She remains on Zyrtec 2 tablets twice daily.  Note has been her baseline medication for years.  Work for her is going well.  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  Therefore she is working as needed in our office and at other places as well.  Otherwise, there have been no changes to her past medical history, surgical history, family history, or social history.    Review of Systems  Constitutional: Negative.  Negative for chills, fever, malaise/fatigue and weight loss.  HENT: Negative.  Negative for congestion, ear discharge, ear pain and sinus pain.   Eyes:  Negative for pain, discharge and redness.  Respiratory:  Positive for cough and wheezing. Negative for sputum production, shortness of breath and stridor.   Cardiovascular: Negative.  Negative for chest pain and palpitations.  Gastrointestinal:  Negative for abdominal pain, constipation, diarrhea, heartburn, nausea and vomiting.  Skin: Negative.  Negative for itching and rash.  Neurological:  Negative for dizziness and headaches.  Endo/Heme/Allergies:  Negative for environmental allergies. Does not bruise/bleed easily.      Objective:    Blood pressure (!) 142/80, pulse (!) 107, temperature 97.9 F (36.6 C), temperature source Temporal, height 5\' 6"  (1.676 m), weight 187 lb 3.2 oz (84.9 kg), SpO2 100 %. Body mass index is 30.21 kg/m.   Physical Exam:  Physical Exam Vitals reviewed.  Constitutional:      Appearance: She is well-developed.     Comments: Very pleasant female.  Talkative.  HENT:     Head: Normocephalic and atraumatic.     Right Ear: Tympanic membrane, ear canal and external ear normal.     Left Ear: Tympanic membrane, ear canal and external ear normal.     Nose: No nasal deformity, septal deviation, mucosal edema or rhinorrhea.     Right Turbinates: Enlarged, swollen and pale.     Left Turbinates: Enlarged, swollen and pale.     Right Sinus: No maxillary sinus tenderness or frontal sinus tenderness.     Left Sinus: No maxillary sinus tenderness or frontal sinus tenderness.  Mouth/Throat:     Mouth: Mucous membranes are not pale and not dry.     Pharynx: Uvula midline.  Eyes:     General: Lids are normal. No allergic shiner.       Right eye: No discharge.        Left eye: No discharge.     Conjunctiva/sclera: Conjunctivae normal.     Right eye: Right conjunctiva is not injected. No chemosis.    Left eye: Left conjunctiva is not injected. No chemosis.    Pupils: Pupils are equal, round, and reactive to light.  Cardiovascular:     Rate and Rhythm: Normal rate and regular rhythm.     Heart sounds: Normal heart sounds.  Pulmonary:     Effort: Pulmonary effort is normal. No tachypnea, accessory muscle usage or respiratory distress.     Breath sounds: Normal breath sounds. No wheezing, rhonchi or rales.     Comments: Moving air well in all lung fields.  No increased work of breathing. Chest:     Chest wall: No tenderness.  Lymphadenopathy:     Cervical: No cervical adenopathy.  Skin:    Coloration: Skin is not pale.     Findings: No abrasion, erythema, petechiae or rash. Rash is not papular,  urticarial or vesicular.  Neurological:     Mental Status: She is alert.  Psychiatric:        Behavior: Behavior is cooperative.     Diagnostic studies:    Spirometry: results normal (FEV1: 2.19/85%, FVC: 2.70/84%, FEV1/FVC: 81%).    Spirometry consistent with normal pattern.   Allergy Studies: none        Salvatore Marvel, MD  [facility]

## 2021-11-07 NOTE — Patient Instructions (Addendum)
1. Moderate persistent asthma, uncomplicated - Lung testing looked AWESOME. - Let's give you Depo 80mg  to help speed this process along. - Complete your prednisone course that you are on already.  - Call me with any issues.  - Daily controller medication(s): Trelegy 200/62.5/25 one puff once daily and Tezspire every four weeks. Will discuss with Dr. Ernst Bowler once he returns about whether he thinks that you should continue Tezspire due to itching and occasional hives - Prior to physical activity: albuterol 2 puffs 10-15 minutes before physical activity. - Rescue medications: albuterol 4 puffs every 4-6 hours as needed - Changes during respiratory infections or worsening symptoms: ADD ON Arnuity 258mcg to 1 puff twice daily for ONE TO TWO WEEKS. - Asthma control goals:  * Full participation in all desired activities (may need albuterol before activity) * Albuterol use two time or less a week on average (not counting use with activity) * Cough interfering with sleep two time or less a month * Oral steroids no more than once a year * No hospitalizations  2. Seasonal and perennial allergic rhinitis - Continue with cetirizine 20mg  twice daily. - Continue ipratropium bromide nasal spray 2 sprays each nostril twice a day as needed. Do not use on the same day that you use Dymista - Continue with Dymista one sprays per nostril up to twice daily. - Continue with nasal saline rinses.   3. Anaphylactic shock due to food - Avoid shrimp. - EpiPen is up to date.   4. Return in about 6 months (around 05/08/2022).    Please inform us of any Emergency Department visits, hospitalizations, or changes in symptoms. Call us before going to the ED for breathing or allergy symptoms since we might be able to fit you in for a sick visit. Feel free to contact us anytime with any questions, problems, or concerns.  It was a pleasure to see you again today!  Websites that have reliable patient information: 1.  American Academy of Asthma, Allergy, and Immunology: www.aaaai.org 2. Food Allergy Research and Education (FARE): foodallergy.org 3. Mothers of Asthmatics: http://www.asthmacommunitynetwork.org 4. American College of Allergy, Asthma, and Immunology: www.acaai.org   COVID-19 Vaccine Information can be found at: [email or web address] For questions related to vaccine distribution or appointments, please email [email or web address] or call [phone].   We realize that you might be concerned about having an allergic reaction to the COVID19 vaccines. To help with that concern, WE ARE OFFERING THE COVID19 VACCINES IN OUR OFFICE! Ask the front desk for dates!     "Like" [place] on Facebook and Instagram for our latest updates!      A healthy democracy works best when [employer] participate! Make sure you are registered to vote! If you have moved or changed any of your contact information, you will need to get this updated before voting!  In some cases, you MAY be able to register to vote online: [email or web address]

## 2021-11-08 ENCOUNTER — Other Ambulatory Visit: Payer: Self-pay | Admitting: Allergy & Immunology

## 2021-11-08 ENCOUNTER — Other Ambulatory Visit (HOSPITAL_COMMUNITY): Payer: Self-pay

## 2021-11-11 ENCOUNTER — Other Ambulatory Visit (HOSPITAL_COMMUNITY): Payer: Self-pay

## 2021-11-11 ENCOUNTER — Encounter: Payer: Self-pay | Admitting: Allergy & Immunology

## 2021-11-11 DIAGNOSIS — J3089 Other allergic rhinitis: Secondary | ICD-10-CM | POA: Diagnosis not present

## 2021-11-11 DIAGNOSIS — T7800XD Anaphylactic reaction due to unspecified food, subsequent encounter: Secondary | ICD-10-CM | POA: Diagnosis not present

## 2021-11-11 DIAGNOSIS — J4541 Moderate persistent asthma with (acute) exacerbation: Secondary | ICD-10-CM | POA: Diagnosis not present

## 2021-11-11 DIAGNOSIS — J302 Other seasonal allergic rhinitis: Secondary | ICD-10-CM | POA: Diagnosis not present

## 2021-11-11 MED ORDER — AZELASTINE-FLUTICASONE 137-50 MCG/ACT NA SUSP
2.0000 | Freq: Two times a day (BID) | NASAL | 5 refills | Status: DC | PRN
  Filled 2021-11-11: qty 23, 30d supply, fill #0
  Filled 2022-09-26 – 2022-09-29 (×3): qty 23, 30d supply, fill #1
  Filled 2022-10-20 – 2022-10-23 (×2): qty 23, 30d supply, fill #2

## 2021-11-11 MED ORDER — FLUCONAZOLE 150 MG PO TABS
150.0000 mg | ORAL_TABLET | Freq: Every day | ORAL | 2 refills | Status: DC
  Filled 2021-11-11: qty 14, 14d supply, fill #0
  Filled 2022-01-31: qty 14, 14d supply, fill #1

## 2021-11-11 NOTE — Addendum Note (Signed)
Addended by: Valentina Shaggy on: 11/11/2021 02:20 PM   Modules accepted: Orders

## 2021-11-12 ENCOUNTER — Other Ambulatory Visit (HOSPITAL_COMMUNITY): Payer: Self-pay

## 2021-11-14 LAB — ALLERGEN PROFILE, SHELLFISH
Clam IgE: <0.1 kU/L
F023-IgE Crab: <0.1 kU/L
F080-IgE Lobster: <0.1 kU/L
F290-IgE Oyster: <0.1 kU/L
Scallop IgE: <0.1 kU/L
Shrimp IgE: 1.25 kU/L — AB

## 2021-11-14 LAB — ALLERGENS W/COMP RFLX AREA 2
Alternaria Alternata IgE: <0.1 kU/L
Aspergillus Fumigatus IgE: <0.1 kU/L
Bermuda Grass IgE: <0.1 kU/L
Cedar, Mountain IgE: <0.1 kU/L
Cladosporium Herbarum IgE: <0.1 kU/L
Cockroach, German IgE: 0.11 kU/L — AB
Common Silver Birch IgE: <0.1 kU/L
Cottonwood IgE: <0.1 kU/L
D Farinae IgE: <0.1 kU/L
D Pteronyssinus IgE: <0.1 kU/L
E001-IgE Cat Dander: <0.1 kU/L
E005-IgE Dog Dander: <0.1 kU/L
Elm, American IgE: <0.1 kU/L
IgE (Immunoglobulin E), Serum: 24 IU/mL (ref 6–495)
Johnson Grass IgE: <0.1 kU/L
Maple/Box Elder IgE: <0.1 kU/L
Mouse Urine IgE: <0.1 kU/L
Oak, White IgE: <0.1 kU/L
Pecan, Hickory IgE: <0.1 kU/L
Penicillium Chrysogen IgE: <0.1 kU/L
Pigweed, Rough IgE: <0.1 kU/L
Ragweed, Short IgE: <0.1 kU/L
Sheep Sorrel IgE Qn: <0.1 kU/L
Timothy Grass IgE: <0.1 kU/L
White Mulberry IgE: <0.1 kU/L

## 2021-11-14 LAB — SARS-COV-2 ANTIBODIES: SARS-CoV-2 Antibodies: POSITIVE

## 2021-11-14 LAB — SARS-COV-2 SEMI-QUANTITATIVE TOTAL ANTIBODY, SPIKE: SARS-CoV-2 Spike Ab Interp: POSITIVE

## 2021-11-14 LAB — SARS-COV-2 SPIKE AB DILUTION: SARS-CoV-2 Spike Ab Dilution: >25000 U/mL (ref <0.8)

## 2021-11-21 ENCOUNTER — Other Ambulatory Visit: Payer: Self-pay

## 2021-11-21 ENCOUNTER — Ambulatory Visit (INDEPENDENT_AMBULATORY_CARE_PROVIDER_SITE_OTHER): Payer: 59

## 2021-11-21 DIAGNOSIS — J4550 Severe persistent asthma, uncomplicated: Secondary | ICD-10-CM | POA: Diagnosis not present

## 2021-12-10 ENCOUNTER — Other Ambulatory Visit (HOSPITAL_COMMUNITY): Payer: Self-pay

## 2021-12-12 ENCOUNTER — Other Ambulatory Visit (HOSPITAL_COMMUNITY): Payer: Self-pay

## 2021-12-19 ENCOUNTER — Other Ambulatory Visit: Payer: Self-pay

## 2021-12-19 ENCOUNTER — Ambulatory Visit (INDEPENDENT_AMBULATORY_CARE_PROVIDER_SITE_OTHER): Payer: 59

## 2021-12-19 DIAGNOSIS — J4550 Severe persistent asthma, uncomplicated: Secondary | ICD-10-CM

## 2022-01-06 ENCOUNTER — Other Ambulatory Visit (HOSPITAL_COMMUNITY): Payer: Self-pay

## 2022-01-07 ENCOUNTER — Other Ambulatory Visit (HOSPITAL_COMMUNITY): Payer: Self-pay

## 2022-01-08 ENCOUNTER — Other Ambulatory Visit (HOSPITAL_COMMUNITY): Payer: Self-pay

## 2022-01-09 ENCOUNTER — Other Ambulatory Visit (HOSPITAL_COMMUNITY): Payer: Self-pay

## 2022-01-09 MED FILL — Valacyclovir HCl Tab 500 MG: ORAL | 60 days supply | Qty: 120 | Fill #3 | Status: CN

## 2022-01-09 MED FILL — Ergocalciferol Cap 1.25 MG (50000 Unit): ORAL | 84 days supply | Qty: 12 | Fill #1 | Status: AC

## 2022-01-10 ENCOUNTER — Other Ambulatory Visit: Payer: Self-pay | Admitting: Adult Health

## 2022-01-10 ENCOUNTER — Other Ambulatory Visit (HOSPITAL_COMMUNITY): Payer: Self-pay

## 2022-01-10 MED ORDER — VALACYCLOVIR HCL 500 MG PO TABS
1000.0000 mg | ORAL_TABLET | Freq: Every day | ORAL | 3 refills | Status: DC
  Filled 2022-01-10: qty 180, 90d supply, fill #0
  Filled 2022-04-03: qty 180, 90d supply, fill #1

## 2022-01-14 ENCOUNTER — Ambulatory Visit (INDEPENDENT_AMBULATORY_CARE_PROVIDER_SITE_OTHER): Payer: 59 | Admitting: Neurology

## 2022-01-14 DIAGNOSIS — G43709 Chronic migraine without aura, not intractable, without status migrainosus: Secondary | ICD-10-CM | POA: Diagnosis not present

## 2022-01-14 NOTE — Progress Notes (Signed)
Botox consent signed  Botox- 200 units x 1 vial Lot: [id number] Expiration: 08/2024 NDC: 3159-4585-92  Bacteriostatic 0.9% Sodium Chloride- 28mL total Lot: [id number] Expiration: 01/08/2023 NDC: 8638-1771-16  Dx: F79.038 B/B

## 2022-01-14 NOTE — Progress Notes (Signed)
Consent Form Botulism Toxin Injection For Chronic Migraine  01/14/2022: did GREAT! Only one-two migraine since last being seen 10/17/2021: First botox. +a.   Reviewed orally with patient, additionally signature is on file:  Botulism toxin has been approved by the Federal drug administration for treatment of chronic migraine. Botulism toxin does not cure chronic migraine and it may not be effective in some patients.  The administration of botulism toxin is accomplished by injecting a small amount of toxin into the muscles of the neck and head. Dosage must be titrated for each individual. Any benefits resulting from botulism toxin tend to wear off after 3 months with a repeat injection required if benefit is to be maintained. Injections are usually done every 3-4 months with maximum effect peak achieved by about 2 or 3 weeks. Botulism toxin is expensive and you should be sure of what costs you will incur resulting from the injection.  The side effects of botulism toxin use for chronic migraine may include:   -Transient, and usually mild, facial weakness with facial injections  -Transient, and usually mild, head or neck weakness with head/neck injections  -Reduction or loss of forehead facial animation due to forehead muscle weakness  -Eyelid drooping  -Dry eye  -Pain at the site of injection or bruising at the site of injection  -Double vision  -Potential unknown long term risks  Contraindications: You should not have Botox if you are pregnant, nursing, allergic to albumin, have an infection, skin condition, or muscle weakness at the site of the injection, or have myasthenia gravis, Lambert-Eaton syndrome, or ALS.  It is also possible that as with any injection, there may be an allergic reaction or no effect from the medication. Reduced effectiveness after repeated injections is sometimes seen and rarely infection at the injection site may occur. All care will be taken to prevent these side  effects. If therapy is given over a long time, atrophy and wasting in the muscle injected may occur. Occasionally the patient's become refractory to treatment because they develop antibodies to the toxin. In this event, therapy needs to be modified.  I have read the above information and consent to the administration of botulism toxin.    BOTOX PROCEDURE NOTE FOR MIGRAINE HEADACHE    Contraindications and precautions discussed with patient(above). Aseptic procedure was observed and patient tolerated procedure. Procedure performed by Dr. Georgia Dom  The condition has existed for more than 6 months, and pt does not have a diagnosis of ALS, Myasthenia Gravis or Lambert-Eaton Syndrome.  Risks and benefits of injections discussed and pt agrees to proceed with the procedure.  Written consent obtained  These injections are medically necessary. Pt  receives good benefits from these injections. These injections do not cause sedations or hallucinations which the oral therapies may cause.  Description of procedure:  The patient was placed in a sitting position. The standard protocol was used for Botox as follows, with 5 units of Botox injected at each site:   -Procerus muscle, midline injection  -Corrugator muscle, bilateral injection  -Frontalis muscle, bilateral injection, with 2 sites each side, medial injection was performed in the upper one third of the frontalis muscle, in the region vertical from the medial inferior edge of the superior orbital rim. The lateral injection was again in the upper one third of the forehead vertically above the lateral limbus of the cornea, 1.5 cm lateral to the medial injection site.  -Temporalis muscle injection, 4 sites, bilaterally. The first injection was 3 cm  above the tragus of the ear, second injection site was 1.5 cm to 3 cm up from the first injection site in line with the tragus of the ear. The third injection site was 1.5-3 cm forward between the first  2 injection sites. The fourth injection site was 1.5 cm posterior to the second injection site.   -Occipitalis muscle injection, 3 sites, bilaterally. The first injection was done one half way between the occipital protuberance and the tip of the mastoid process behind the ear. The second injection site was done lateral and superior to the first, 1 fingerbreadth from the first injection. The third injection site was 1 fingerbreadth superiorly and medially from the first injection site.  -Cervical paraspinal muscle injection, 2 sites, bilateral knee first injection site was 1 cm from the midline of the cervical spine, 3 cm inferior to the lower border of the occipital protuberance. The second injection site was 1.5 cm superiorly and laterally to the first injection site.  -Trapezius muscle injection was performed at 3 sites, bilaterally. The first injection site was in the upper trapezius muscle halfway between the inflection point of the neck, and the acromion. The second injection site was one half way between the acromion and the first injection site. The third injection was done between the first injection site and the inflection point of the neck.   Will return for repeat injection in 3 months.   200 units of Botox was used, any Botox not injected was wasted. The patient tolerated the procedure well, there were no complications of the above procedure.

## 2022-01-16 ENCOUNTER — Ambulatory Visit: Payer: 59

## 2022-01-16 ENCOUNTER — Other Ambulatory Visit (HOSPITAL_COMMUNITY): Payer: Self-pay

## 2022-01-23 ENCOUNTER — Encounter: Payer: Self-pay | Admitting: Neurology

## 2022-01-23 ENCOUNTER — Other Ambulatory Visit (HOSPITAL_COMMUNITY): Payer: Self-pay

## 2022-01-23 ENCOUNTER — Encounter: Payer: Self-pay | Admitting: Adult Health

## 2022-01-23 ENCOUNTER — Ambulatory Visit (INDEPENDENT_AMBULATORY_CARE_PROVIDER_SITE_OTHER): Payer: 59

## 2022-01-23 ENCOUNTER — Other Ambulatory Visit: Payer: Self-pay

## 2022-01-23 DIAGNOSIS — J4550 Severe persistent asthma, uncomplicated: Secondary | ICD-10-CM

## 2022-01-23 MED ORDER — TIZANIDINE HCL 4 MG PO TABS
4.0000 mg | ORAL_TABLET | Freq: Three times a day (TID) | ORAL | 0 refills | Status: DC | PRN
  Filled 2022-01-23: qty 10, 4d supply, fill #0

## 2022-01-24 ENCOUNTER — Other Ambulatory Visit (HOSPITAL_COMMUNITY): Payer: Self-pay

## 2022-01-27 ENCOUNTER — Other Ambulatory Visit (HOSPITAL_COMMUNITY): Payer: Self-pay

## 2022-01-28 ENCOUNTER — Ambulatory Visit: Payer: 59

## 2022-01-28 ENCOUNTER — Other Ambulatory Visit (HOSPITAL_COMMUNITY): Payer: Self-pay

## 2022-01-30 NOTE — Progress Notes (Signed)
Donna Park D.Donna Park Donna Park Phone: [phone]   Assessment and Plan:     1. Neck pain 2. Somatic dysfunction of cervical region 3. Somatic dysfunction of thoracic region 4. Somatic dysfunction of lumbar region 5. Somatic dysfunction of pelvic region 6. Somatic dysfunction of rib region -Chronic with exacerbation, initial sports medicine visit - Patient presents with multiple musculoskeletal complaints with most prominent being in neck radiating to left shoulder likely due to patient's job demands - No red flag symptoms, so no imaging obtained today - Start meloxicam 15 mg daily x2 weeks.  If still having pain after 2 weeks, complete 3rd-week of meloxicam. May use remaining meloxicam as needed once daily for pain control.  Do not to use additional NSAIDs while taking meloxicam.  May use Tylenol [phone] mg to 3 times a day for breakthrough pain. - start HEP for neck and trapezius - Patient has received significant relief with OMT in the past.  Elects for repeat OMT today.  Tolerated well per note below. - Decision today to treat with OMT was based on Physical Exam  After verbal consent patient was treated with HVLA (high velocity low amplitude), ME (muscle energy), FPR (flex positional release), ST (soft tissue), PC/PD (Pelvic Compression/ Pelvic Decompression) techniques in cervical, rib, thoracic, lumbar, and pelvic areas. Patient tolerated the procedure well with improvement in symptoms.  Patient educated on potential side effects of soreness and recommended to rest, hydrate, and use Tylenol as needed for pain control.    Pertinent previous records reviewed include telephone encounter from 01/31/2022   Follow Up: 3 weeks for reevaluation.  Could repeat OMT if patient received benefit from today's visit.  Would discontinue daily meloxicam at that time.  Can obtain x-rays if no improvement or worsening of symptoms    Subjective:   I, Donna Park, am serving as a Education administrator for Doctor Glennon Mac  Chief Complaint: neck and shoulder pain   HPI:  01/31/2022 Patient is a 51 year old female complaining of neck and shoulder pain. Patient states been going on for about 2 weeks has knots in her shoulders both neck and shoulders have been stiff , interested in OMT , knots in her upper trap , low back and her sciatic nerve on her right side flares up when she's having intercourse goes down her leg and upper her back   Relevant Historical Information: History of migraines being treated with Botox injections  Additional pertinent review of systems negative.   Current Outpatient Medications:    meloxicam (MOBIC) 15 MG tablet, Take 1 tablet (15 mg total) by mouth daily., Disp: 30 tablet, Rfl: 0   acetaminophen (TYLENOL) 500 MG tablet, Take by mouth., Disp: , Rfl:    albuterol (VENTOLIN HFA) 108 (90 Base) MCG/ACT inhaler, Inhale 1-2 puffs into the lungs every 6 (six) hours as needed for wheezing or shortness of breath., Disp: 54 g, Rfl: 0   amitriptyline (ELAVIL) 10 MG tablet, Take 1 tablet (10 mg total) by mouth at bedtime., Disp: 90 tablet, Rfl: 1   azelastine (ASTELIN) 0.1 % nasal spray, Place 2 sprays into both nostrils 2 (two) times daily., Disp: 30 mL, Rfl: 5   Azelastine-Fluticasone (DYMISTA) 137-50 MCG/ACT SUSP, Place 2 sprays into both nostrils 2 (two) times daily as needed., Disp: 23 g, Rfl: 5   CALCIUM PO, Take 1 tablet by mouth 4 (four) times a week., Disp: , Rfl:    cetirizine (ZYRTEC) 10  MG tablet, Take 2 tablets (20 mg total) by mouth 2 (two) times daily., Disp: 360 tablet, Rfl: 1   COVID-19 At Home Antigen Test (CARESTART COVID-19 HOME TEST) KIT, Use as directed, Disp: 4 each, Rfl: 0   COVID-19 At Home Antigen Test (CARESTART COVID-19 HOME TEST) KIT, Use as directed, Disp: 4 each, Rfl: 0   COVID-19 At Home Antigen Test KIT, USE AS DIRECTED WITHIN PACKAGE INSERT, Disp: 4 kit, Rfl: 0    Cyanocobalamin (NASCOBAL NA), Place 1 spray into the nose every Sunday., Disp: , Rfl:    diazepam (VALIUM) 10 MG tablet, Take 1 tablet (10 mg total) by mouth at bedtime as needed for sleep., Disp: 30 tablet, Rfl: 2   EPINEPHrine 0.3 mg/0.3 mL IJ SOAJ injection, Inject 0.3 mg into the muscle as needed for anaphylaxis., Disp: 2 each, Rfl: 1   fluconazole (DIFLUCAN) 150 MG tablet, Take 1 tablet (150 mg total) by mouth daily., Disp: 14 tablet, Rfl: 2   fluticasone (FLONASE) 50 MCG/ACT nasal spray, Place 2 sprays into both nostrils daily., Disp: 16 g, Rfl: 5   fluticasone-salmeterol (ADVAIR HFA) 230-21 MCG/ACT inhaler, INHALE 2 PUFFS INTO THE LUNGS 2 (TWO) TIMES DAILY., Disp: 36 g, Rfl: 1   Fluticasone-Umeclidin-Vilant (TRELEGY ELLIPTA) 200-62.5-25 MCG/ACT AEPB, Inhale 1 puff into the lungs daily., Disp: 60 each, Rfl: 5   Galcanezumab-gnlm (EMGALITY) 120 MG/ML SOAJ, Inject 120 mg into the skin every 30 (thirty) days., Disp: 1 mL, Rfl: 11   Hypertonic Nasal Wash (SINUS RINSE REFILL) PACK, Use one packet dissolved in water as needed. (Patient taking differently: Place 1 each into the nose in the morning and at bedtime.), Disp: 100 each, Rfl: 5   ibuprofen (ADVIL) 800 MG tablet, Take 1 tablet (800 mg total) by mouth 3 (three) times daily as needed (pain.)., Disp: 270 tablet, Rfl: 0   imiquimod (ALDARA) 5 % cream, Apply daily as needed for 5 days, Disp: 24 each, Rfl: 1   ipratropium (ATROVENT) 0.06 % nasal spray, Place 2 sprays into both nostrils 4 (four) times daily., Disp: 45 mL, Rfl: 3   lactulose, encephalopathy, (CHRONULAC) 10 GM/15ML SOLN, Take 30 mLs (20 g total) by mouth daily as needed., Disp: 450 mL, Rfl: 0   meclizine (ANTIVERT) 25 MG tablet, Take 25 mg by mouth 3 (three) times daily as needed (dizziness/vertigo). , Disp: , Rfl:    MUCUS RELIEF ER 600 MG 12 hr tablet, Take 600-1,200 mg by mouth 2 (two) times daily as needed (congestion). , Disp: , Rfl:    ondansetron (ZOFRAN-ODT) 4 MG disintegrating  tablet, Take 1-2 tablets (4-8 mg total) by mouth every 8 (eight) hours as needed. May take with Rizatriptan, Disp: 30 tablet, Rfl: 3   pantoprazole (PROTONIX) 40 MG tablet, TAKE 1 TABLET (40 MG TOTAL) BY MOUTH AT BEDTIME., Disp: 90 tablet, Rfl: 3   Pediatric Multiple Vit-C-FA (MULTIVITAMIN ANIMAL SHAPES, WITH CA/FA,) with C & FA chewable tablet, Chew 2 tablets by mouth daily., Disp: , Rfl:    rizatriptan (MAXALT-MLT) 10 MG disintegrating tablet, Dissolve 1 tablet (10 mg total) by mouth as needed for migraine. May repeat in 2 hours if needed, Disp: 9 tablet, Rfl: 11   rizatriptan (MAXALT-MLT) 10 MG disintegrating tablet, Dissolve 1 tablet (10 mg total) by mouth as needed for migraine. May repeat in 2 hours if needed, Disp: 27 tablet, Rfl: 4   simethicone (MYLICON) 80 MG chewable tablet, Chew 80 mg by mouth every 6 (six) hours as needed for flatulence., Disp: ,  Rfl:    tezepelumab-ekko (TEZSPIRE) 210 MG/1.91ML syringe, Inject 1.91 mLs (210 mg total) into the skin every 28 (twenty-eight) days., Disp: 1.91 mL, Rfl: 11   tinidazole (TINDAMAX) 500 MG tablet, Take 2 tablets (1,000 mg total) by mouth daily with breakfast for 5 days, Disp: 10 tablet, Rfl: 0   tiZANidine (ZANAFLEX) 4 MG tablet, Take 1 tablet by mouth every 8  hours as needed., Disp: 10 tablet, Rfl: 0   triamcinolone ointment (KENALOG) 0.1 %, APPLY TO AFFECTED AREA(S) 2 TIMES A DAY, Disp: 454 g, Rfl: 2   valACYclovir (VALTREX) 500 MG tablet, Take 2 tablets (1,000 mg total) by mouth at bedtime., Disp: 180 tablet, Rfl: 3   Vibegron (GEMTESA) 75 MG TABS, Take 1 tablet by mouth daily., Disp: 30 tablet, Rfl: 11   Vitamin D, Ergocalciferol, (DRISDOL) 1.25 MG (50000 UNIT) CAPS capsule, TAKE 1 CAPSULE BY MOUTH ONCE A WEEK., Disp: 12 capsule, Rfl: 3  Current Facility-Administered Medications:    tezepelumab-ekko (TEZSPIRE) 210 MG/1.91ML syringe 210 mg, 210 mg, Subcutaneous, Q28 days, Valentina Shaggy, MD, 210 mg at 01/23/22 1642   Objective:      Vitals:   01/31/22 0841  BP: 112/80  Pulse: 80  SpO2: 97%  Weight: 178 lb (80.7 kg)  Height: 5' 6"  (1.676 m)      Body mass index is 28.73 kg/m.    Physical Exam:      OMT Physical Exam:  ASIS Compression Test: Positive Right Cervical: TTP paraspinal, C3-6 RL SL.  Limited bilateral sidebending Rib: Bilateral elevated first rib with TTP, worse on left Thoracic: TTP paraspinal, T3-7 RRSL Lumbar: TTP paraspinal, L2 RRSR Pelvis: Right anterior innominate   Gen: Appears well, nad, nontoxic and pleasant Psych: Alert and oriented, appropriate mood and affect Neuro: sensation intact, strength is 5/5 in upper and lower extremities, muscle tone wnl Skin: no susupicious lesions or rashes  Back - Normal skin, Spine with normal alignment and no deformity.   No tenderness to vertebral process palpation.   Thoracic paraspinous muscles are mildly tender and without spasm Straight leg raise negative TTP most significantly at cervical paraspinal, trapezius on left  Electronically signed by:  Donna Park D.[facility] 9:43 AM 01/31/22

## 2022-01-31 ENCOUNTER — Other Ambulatory Visit (HOSPITAL_COMMUNITY): Payer: Self-pay

## 2022-01-31 ENCOUNTER — Ambulatory Visit (INDEPENDENT_AMBULATORY_CARE_PROVIDER_SITE_OTHER): Payer: 59 | Admitting: Sports Medicine

## 2022-01-31 ENCOUNTER — Ambulatory Visit: Payer: 59 | Admitting: Adult Health

## 2022-01-31 ENCOUNTER — Ambulatory Visit: Payer: 59 | Admitting: Sports Medicine

## 2022-01-31 ENCOUNTER — Telehealth: Payer: Self-pay | Admitting: Adult Health

## 2022-01-31 ENCOUNTER — Other Ambulatory Visit: Payer: Self-pay | Admitting: Adult Health

## 2022-01-31 ENCOUNTER — Encounter: Payer: Self-pay | Admitting: Adult Health

## 2022-01-31 ENCOUNTER — Other Ambulatory Visit: Payer: Self-pay

## 2022-01-31 VITALS — BP 112/80 | HR 80 | Ht 66.0 in | Wt 178.0 lb

## 2022-01-31 VITALS — BP 112/80 | HR 80 | Temp 97.9°F | Ht 66.0 in | Wt 178.0 lb

## 2022-01-31 DIAGNOSIS — M9905 Segmental and somatic dysfunction of pelvic region: Secondary | ICD-10-CM

## 2022-01-31 DIAGNOSIS — Z1211 Encounter for screening for malignant neoplasm of colon: Secondary | ICD-10-CM

## 2022-01-31 DIAGNOSIS — N760 Acute vaginitis: Secondary | ICD-10-CM | POA: Diagnosis not present

## 2022-01-31 DIAGNOSIS — B9689 Other specified bacterial agents as the cause of diseases classified elsewhere: Secondary | ICD-10-CM

## 2022-01-31 DIAGNOSIS — M9908 Segmental and somatic dysfunction of rib cage: Secondary | ICD-10-CM | POA: Diagnosis not present

## 2022-01-31 DIAGNOSIS — M542 Cervicalgia: Secondary | ICD-10-CM

## 2022-01-31 DIAGNOSIS — M9902 Segmental and somatic dysfunction of thoracic region: Secondary | ICD-10-CM

## 2022-01-31 DIAGNOSIS — M9901 Segmental and somatic dysfunction of cervical region: Secondary | ICD-10-CM | POA: Diagnosis not present

## 2022-01-31 DIAGNOSIS — K5909 Other constipation: Secondary | ICD-10-CM

## 2022-01-31 DIAGNOSIS — M9903 Segmental and somatic dysfunction of lumbar region: Secondary | ICD-10-CM

## 2022-01-31 MED ORDER — ACYCLOVIR 5 % EX CREA
TOPICAL_CREAM | CUTANEOUS | 2 refills | Status: DC
  Filled 2022-01-31: qty 5, 5d supply, fill #0

## 2022-01-31 MED ORDER — MELOXICAM 15 MG PO TABS
15.0000 mg | ORAL_TABLET | Freq: Every day | ORAL | 0 refills | Status: DC
  Filled 2022-01-31: qty 30, 30d supply, fill #0

## 2022-01-31 MED ORDER — TINIDAZOLE 500 MG PO TABS
1000.0000 mg | ORAL_TABLET | Freq: Every day | ORAL | 0 refills | Status: DC
  Filled 2022-01-31: qty 10, 5d supply, fill #0

## 2022-01-31 MED ORDER — LACTULOSE ENCEPHALOPATHY 10 GM/15ML PO SOLN
20.0000 g | Freq: Every day | ORAL | 0 refills | Status: DC | PRN
  Filled 2022-01-31: qty 450, 15d supply, fill #0

## 2022-01-31 MED ORDER — IMIQUIMOD 5 % EX CREA
TOPICAL_CREAM | Freq: Every day | CUTANEOUS | 1 refills | Status: DC | PRN
  Filled 2022-01-31: qty 24, 12d supply, fill #0

## 2022-01-31 NOTE — Progress Notes (Signed)
Subjective:    Patient ID: Donna Park, female    DOB: [birth year], 51 y.o.   MRN: [id number]  HPI 51 year old female who  has a past medical history of Anxiety, Asthma, Chicken pox, Complication of anesthesia, DJD (degenerative joint disease), GERD (gastroesophageal reflux disease), H/O gastric bypass (2016), History of iron deficiency anemia, HSV infection, Migraines, Obese, Pneumonia, PONV (postoperative nausea and vomiting), and Varicose vein of leg.  She presents to the office today for for multiple issues.  Chronic Constipation -has been an ongoing.  Benefiber, Metamucil, fiber Gummies, hydration, and tries to eat healthy diet, and walks a lot throughout the day.  Despite all of these modalities she continues to have issues with constipation.  She does report bloating at times, will go multiple days without having a bowel movement.  Did try and get her in for her routine screening colonoscopy last year but was unable to get scheduled as she did not call about her GI back.  Additionally, she has a history of bacterial vaginosis.  Over the last day or so she has been experiencing vaginal discharge, odor, and burning.  She does take Diflucan on a regular basis due to oral thrush.  Symptoms are consistent with previous bacterial vaginosis infections.  No concerns for STDs.   Review of Systems See HPI   Past Medical History:  Diagnosis Date   Anxiety    Asthma    Chicken pox    Complication of anesthesia    slow to wake up from anesthesia   DJD (degenerative joint disease)    GERD (gastroesophageal reflux disease)    resolved after gastric surgery   H/O gastric bypass 2016   History of iron deficiency anemia    HSV infection    Migraines    Obese    Pneumonia    Childhood history   PONV (postoperative nausea and vomiting)    Varicose vein of leg     Social History   Socioeconomic History   Marital status: Married    Spouse name: Not on file   Number of children: Not  on file   Years of education: Not on file   Highest education level: Associate degree: academic program  Occupational History   Not on file  Tobacco Use   Smoking status: Never   Smokeless tobacco: Never  Vaping Use   Vaping Use: Never used  Substance and Sexual Activity   Alcohol use: Yes    Comment: Social/Rare   Drug use: Never   Sexual activity: Yes    Birth control/protection: Surgical  Other Topics Concern   Not on file  Social History Narrative   Not on file   Social Determinants of Health   Financial Resource Strain: Medium Risk   Difficulty of Paying Living Expenses: Somewhat hard  Food Insecurity: No Food Insecurity   Worried About Charity fundraiser in the Last Year: Never true   Ran Out of Food in the Last Year: Never true  Transportation Needs: No Transportation Needs   Lack of Transportation (Medical): No   Lack of Transportation (Non-Medical): No  Physical Activity: Sufficiently Active   Days of Exercise per Week: 5 days   Minutes of Exercise per Session: 150+ min  Stress: Stress Concern Present   Feeling of Stress : Very much  Social Connections: Moderately Integrated   Frequency of Communication with Friends and Family: More than three times a week   Frequency of Social Gatherings with Friends and  Family: Never   Attends Religious Services: More than 4 times per year   Active Member of Clubs or Organizations: Yes   Attends Music therapist: More than 4 times per year   Marital Status: Separated  Intimate Partner Violence: Not on file    Past Surgical History:  Procedure Laterality Date   ANKLE FRACTURE SURGERY Left 03/08/2009   BREAST SURGERY Bilateral    Cyst Removal   GASTRIC BYPASS  06/18/2015   LAPAROSCOPIC VAGINAL HYSTERECTOMY WITH SALPINGECTOMY Bilateral 05/15/2020   Procedure: LAPAROSCOPIC ASSISTED VAGINAL HYSTERECTOMY WITH SALPINGECTOMY;  Surgeon: Louretta Shorten, MD;  Location: [facility];  Service: Gynecology;   Laterality: Bilateral;  need bed   LASER ABLATION     Varicose veins   TONSILLECTOMY AND ADENOIDECTOMY  2001   TUBAL LIGATION  12/19/1999   UPPER GI ENDOSCOPY     Uterine ablation  12/2018    Family History  Problem Relation Age of Onset   COPD Mother    Heart disease Mother    Alcohol abuse Mother    Depression Mother    Drug abuse Mother    High Cholesterol Mother    High blood pressure Mother    COPD Father    Aneurysm Father    Heart disease Father    Early death Father    Diabetes Father    Drug abuse Father    AAA (abdominal aortic aneurysm) Father        cause of death at 77   Arthritis Sister    Kidney disease Sister    Asthma Neg Hx    Allergic rhinitis Neg Hx     Allergies  Allergen Reactions   Cyclobenzaprine Shortness Of Breath and Other (See Comments)    Relaxed tongue    Shrimp Flavor Anaphylaxis   Sulfa Antibiotics Anaphylaxis, Hives and Swelling   Carisoprodol Hives   Metronidazole     not allergic; drug doesn't work for pt   Morphine Other (See Comments)    Feels like her head is going to "blow up"     Topiramate     Brain fog   Adhesive [Tape] Rash    Other reaction(s): SKIN - rash (skin burns) PAPER TAPE IS OK    Tramadol Itching, Nausea And Vomiting and Rash    Current Outpatient Medications on File Prior to Visit  Medication Sig Dispense Refill   acetaminophen (TYLENOL) 500 MG tablet Take by mouth.     albuterol (VENTOLIN HFA) 108 (90 Base) MCG/ACT inhaler Inhale 1-2 puffs into the lungs every 6 (six) hours as needed for wheezing or shortness of breath. 54 g 0   amitriptyline (ELAVIL) 10 MG tablet Take 1 tablet (10 mg total) by mouth at bedtime. 90 tablet 1   azelastine (ASTELIN) 0.1 % nasal spray Place 2 sprays into both nostrils 2 (two) times daily. 30 mL 5   Azelastine-Fluticasone (DYMISTA) 137-50 MCG/ACT SUSP Place 2 sprays into both nostrils 2 (two) times daily as needed. 23 g 5   CALCIUM PO Take 1 tablet by mouth 4 (four) times a  week.     cetirizine (ZYRTEC) 10 MG tablet Take 2 tablets (20 mg total) by mouth 2 (two) times daily. 360 tablet 1   COVID-19 At Home Antigen Test (CARESTART COVID-19 HOME TEST) KIT Use as directed 4 each 0   COVID-19 At Home Antigen Test (CARESTART COVID-19 HOME TEST) KIT Use as directed 4 each 0   COVID-19 At Home Antigen Test KIT  USE AS DIRECTED WITHIN PACKAGE INSERT 4 kit 0   Cyanocobalamin (NASCOBAL NA) Place 1 spray into the nose every Sunday.     diazepam (VALIUM) 10 MG tablet Take 1 tablet (10 mg total) by mouth at bedtime as needed for sleep. 30 tablet 2   EPINEPHrine 0.3 mg/0.3 mL IJ SOAJ injection Inject 0.3 mg into the muscle as needed for anaphylaxis. 2 each 1   fluconazole (DIFLUCAN) 150 MG tablet Take 1 tablet (150 mg total) by mouth daily. 14 tablet 2   fluticasone (FLONASE) 50 MCG/ACT nasal spray Place 2 sprays into both nostrils daily. 16 g 5   Fluticasone-Umeclidin-Vilant (TRELEGY ELLIPTA) 200-62.5-25 MCG/ACT AEPB Inhale 1 puff into the lungs daily. 60 each 5   Galcanezumab-gnlm (EMGALITY) 120 MG/ML SOAJ Inject 120 mg into the skin every 30 (thirty) days. 1 mL 11   Hypertonic Nasal Wash (SINUS RINSE REFILL) PACK Use one packet dissolved in water as needed. (Patient taking differently: Place 1 each into the nose in the morning and at bedtime.) 100 each 5   ibuprofen (ADVIL) 800 MG tablet Take 1 tablet (800 mg total) by mouth 3 (three) times daily as needed (pain.). 270 tablet 0   ipratropium (ATROVENT) 0.06 % nasal spray Place 2 sprays into both nostrils 4 (four) times daily. 45 mL 3   meclizine (ANTIVERT) 25 MG tablet Take 25 mg by mouth 3 (three) times daily as needed (dizziness/vertigo).      MUCUS RELIEF ER 600 MG 12 hr tablet Take 600-1,200 mg by mouth 2 (two) times daily as needed (congestion).      ondansetron (ZOFRAN-ODT) 4 MG disintegrating tablet Take 1-2 tablets (4-8 mg total) by mouth every 8 (eight) hours as needed. May take with Rizatriptan 30 tablet 3   pantoprazole  (PROTONIX) 40 MG tablet TAKE 1 TABLET (40 MG TOTAL) BY MOUTH AT BEDTIME. 90 tablet 3   Pediatric Multiple Vit-C-FA (MULTIVITAMIN ANIMAL SHAPES, WITH CA/FA,) with C & FA chewable tablet Chew 2 tablets by mouth daily.     rizatriptan (MAXALT-MLT) 10 MG disintegrating tablet Dissolve 1 tablet (10 mg total) by mouth as needed for migraine. May repeat in 2 hours if needed 9 tablet 11   rizatriptan (MAXALT-MLT) 10 MG disintegrating tablet Dissolve 1 tablet (10 mg total) by mouth as needed for migraine. May repeat in 2 hours if needed 27 tablet 4   simethicone (MYLICON) 80 MG chewable tablet Chew 80 mg by mouth every 6 (six) hours as needed for flatulence.     tezepelumab-ekko (TEZSPIRE) 210 MG/1.91ML syringe Inject 1.91 mLs (210 mg total) into the skin every 28 (twenty-eight) days. 1.91 mL 11   tiZANidine (ZANAFLEX) 4 MG tablet Take 1 tablet by mouth every 8  hours as needed. 10 tablet 0   triamcinolone ointment (KENALOG) 0.1 % APPLY TO AFFECTED AREA(S) 2 TIMES A DAY 454 g 2   valACYclovir (VALTREX) 500 MG tablet Take 2 tablets (1,000 mg total) by mouth at bedtime. 180 tablet 3   Vibegron (GEMTESA) 75 MG TABS Take 1 tablet by mouth daily. 30 tablet 11   Vitamin D, Ergocalciferol, (DRISDOL) 1.25 MG (50000 UNIT) CAPS capsule TAKE 1 CAPSULE BY MOUTH ONCE A WEEK. 12 capsule 3   fluticasone-salmeterol (ADVAIR HFA) 230-21 MCG/ACT inhaler INHALE 2 PUFFS INTO THE LUNGS 2 (TWO) TIMES DAILY. 36 g 1   Current Facility-Administered Medications on File Prior to Visit  Medication Dose Route Frequency Provider Last Rate Last Admin   tezepelumab-ekko (TEZSPIRE) 210 MG/1.91ML syringe 210  mg  210 mg Subcutaneous Q28 days Valentina Shaggy, MD   210 mg at 01/23/22 1642    BP 112/80    Pulse 80    Temp 97.9 F (36.6 C) (Oral)    Ht _0  (1.676 m)    Wt 178 lb (80.7 kg)    SpO2 97%    BMI 28.73 kg/m       Objective:   Physical Exam Vitals and nursing note reviewed.  Constitutional:      Appearance: Normal  appearance.  Pulmonary:     Effort: Pulmonary effort is normal.     Breath sounds: Normal breath sounds.  Abdominal:     General: Abdomen is flat. Bowel sounds are normal.     Palpations: Abdomen is soft.     Tenderness: There is abdominal tenderness in the left upper quadrant and left lower quadrant.  Musculoskeletal:        General: Normal range of motion.  Skin:    General: Skin is warm and dry.  Neurological:     General: No focal deficit present.     Mental Status: She is alert and oriented to person, place, and time.  Psychiatric:        Mood and Affect: Mood normal.        Behavior: Behavior normal.        Thought Content: Thought content normal.        Judgment: Judgment normal.       Assessment & Plan:  1. Bacterial vaginosis  - tinidazole (TINDAMAX) 500 MG tablet; Take 2 tablets (1,000 mg total) by mouth daily with breakfast for 5 days.  Dispense: 10 tablet; Refill: 0  2. Chronic constipation  - Lactulose 20 GM/30ML SOLN; Take 30 mLs (20 g total) by mouth daily as needed.  Dispense: 450 mL; Refill: 0  3. Colon cancer screening  - Ambulatory referral to Gastroenterology  Dorothyann Peng, NP

## 2022-01-31 NOTE — Patient Instructions (Addendum)
Good to see you  - Start meloxicam 15 mg daily x2 weeks.  If still having pain after 2 weeks, complete 3rd-week of meloxicam. May use remaining meloxicam as needed once daily for pain control.  Do not to use additional NSAIDs while taking meloxicam.  May use Tylenol [phone] mg to 3 times a day for breakthrough pain. Neck ROM  3 week follow up

## 2022-01-31 NOTE — Telephone Encounter (Signed)
Called pt to give the number for pt to call and sch. Pt has no vm set tup. Sent Mychart msg for pt to call [facility] to sch appt.

## 2022-01-31 NOTE — Addendum Note (Signed)
Addended by: Apolinar Junes on: 01/31/2022 08:26 AM   Modules accepted: Orders

## 2022-02-03 ENCOUNTER — Other Ambulatory Visit: Payer: Self-pay | Admitting: Adult Health

## 2022-02-03 ENCOUNTER — Encounter: Payer: Self-pay | Admitting: Sports Medicine

## 2022-02-03 ENCOUNTER — Other Ambulatory Visit (HOSPITAL_COMMUNITY): Payer: Self-pay

## 2022-02-03 NOTE — Telephone Encounter (Signed)
Pt was called and does not have VM that has been set up yet will try again later

## 2022-02-04 ENCOUNTER — Other Ambulatory Visit: Payer: Self-pay | Admitting: Adult Health

## 2022-02-04 ENCOUNTER — Other Ambulatory Visit (HOSPITAL_COMMUNITY): Payer: Self-pay

## 2022-02-04 MED ORDER — ACYCLOVIR 5 % EX OINT
1.0000 "application " | TOPICAL_OINTMENT | CUTANEOUS | 1 refills | Status: DC
  Filled 2022-02-04: qty 15, 30d supply, fill #0
  Filled 2022-04-03: qty 15, 10d supply, fill #0
  Filled 2022-05-01: qty 15, 10d supply, fill #1

## 2022-02-04 MED ORDER — ACYCLOVIR 5 % EX OINT
1.0000 "application " | TOPICAL_OINTMENT | CUTANEOUS | 1 refills | Status: DC
  Filled 2022-02-04: qty 15, 1d supply, fill #0

## 2022-02-04 NOTE — Telephone Encounter (Signed)
Spoke with pt on only taking 1 meloxicam and taking tylenol 1000 mg 3 times a day for breakthrough pain she understood that she could only take 1 meloxicam and that taking more than 1 could lead to other health issues

## 2022-02-05 ENCOUNTER — Other Ambulatory Visit (HOSPITAL_COMMUNITY): Payer: Self-pay

## 2022-02-06 ENCOUNTER — Other Ambulatory Visit (HOSPITAL_COMMUNITY): Payer: Self-pay

## 2022-02-10 ENCOUNTER — Other Ambulatory Visit (HOSPITAL_COMMUNITY): Payer: Self-pay

## 2022-02-11 ENCOUNTER — Telehealth: Payer: Self-pay | Admitting: Gastroenterology

## 2022-02-11 NOTE — Telephone Encounter (Signed)
Good morning Dr. Silverio Decamp, ? ?This patient is requesting a transfer of care to you.  She moved here about 3 years ago from [place].  While in [place], she had an EGD in 2016 prior to having gastric bypass surgery.  (These reports and her other office visits are in [place] as part of [facility].  Currently, she is having burning in her stomach, says she is chronically constipated, but within the last two weeks has been experiencing loose stools and has lost about 6 pounds within this last two weeks.  Please let me know if you approve her transfer. ? ?Thank you. ?

## 2022-02-11 NOTE — Telephone Encounter (Signed)
Please schedule office visit next available appointment with APP. Thanks ?

## 2022-02-12 ENCOUNTER — Encounter: Payer: Self-pay | Admitting: Gastroenterology

## 2022-02-14 ENCOUNTER — Other Ambulatory Visit (HOSPITAL_COMMUNITY): Payer: Self-pay

## 2022-02-14 ENCOUNTER — Encounter: Payer: 59 | Admitting: Adult Health

## 2022-02-17 ENCOUNTER — Encounter: Payer: Self-pay | Admitting: Adult Health

## 2022-02-17 NOTE — Telephone Encounter (Signed)
Appt rescheduled for 02/18/22. ?

## 2022-02-18 ENCOUNTER — Encounter: Payer: Self-pay | Admitting: Adult Health

## 2022-02-18 ENCOUNTER — Ambulatory Visit: Payer: 59 | Admitting: Adult Health

## 2022-02-18 ENCOUNTER — Other Ambulatory Visit (HOSPITAL_COMMUNITY)
Admission: RE | Admit: 2022-02-18 | Discharge: 2022-02-18 | Disposition: A | Discharge status: Home / Self Care | Payer: 59 | Source: Ambulatory Visit | Attending: Adult Health | Admitting: Adult Health

## 2022-02-18 VITALS — BP 110/86 | HR 79 | Temp 97.5°F | Ht 66.0 in | Wt 166.0 lb

## 2022-02-18 DIAGNOSIS — Z113 Encounter for screening for infections with a predominantly sexual mode of transmission: Secondary | ICD-10-CM | POA: Diagnosis not present

## 2022-02-18 DIAGNOSIS — R300 Dysuria: Secondary | ICD-10-CM | POA: Diagnosis not present

## 2022-02-18 DIAGNOSIS — F32A Depression, unspecified: Secondary | ICD-10-CM

## 2022-02-18 DIAGNOSIS — F419 Anxiety disorder, unspecified: Secondary | ICD-10-CM

## 2022-02-18 LAB — POCT URINALYSIS DIPSTICK
Bilirubin, UA: NEGATIVE
Blood, UA: NEGATIVE
Glucose, UA: NEGATIVE
Ketones, UA: NEGATIVE
Leukocytes, UA: NEGATIVE
Nitrite, UA: NEGATIVE
Protein, UA: NEGATIVE
Spec Grav, UA: 1.015 (ref 1.010–1.025)
Urobilinogen, UA: 0.2 E.U./dL
pH, UA: 6 (ref 5.0–8.0)

## 2022-02-18 MED ORDER — CITALOPRAM HYDROBROMIDE 10 MG PO TABS: 10.0000 mg | ORAL_TABLET | Freq: Every day | ORAL | 0 refills | Status: DC

## 2022-02-18 NOTE — Progress Notes (Signed)
? ?Subjective:  ? ? Patient ID: Donna Park, female    DOB: 02/24/1971, 51 y.o.   MRN: [id number] ? ?HPI ? ?51 year old female who  has a past medical history of Anxiety, Asthma, Chicken pox, Complication of anesthesia, DJD (degenerative joint disease), GERD (gastroesophageal reflux disease), H/O gastric bypass (2016), History of iron deficiency anemia, HSV infection, Migraines, Obese, Pneumonia, PONV (postoperative nausea and vomiting), and Varicose vein of leg. ? ?She presents to the office today for anxiety and depression.  He reports that roughly 2 weeks ago she found out that her significant other of 3 years had been having a relationship with someone else in addition to herself.  She feels as though she cannot sleep, she has no appetite, when she does try and eat she becomes nauseous, has had increased bowel movements due to anxiety.  Has not had any vomiting. ? ?She is also looking at moving back to [place] towards the end of the summer to be around her mother and other family members and finds this stressful as well. ? ?She is taking diazepam at night to help her sleep.  During the day she feels not like herself, is having trouble concentrating and staying focused. ? ?Currently denies SI or HI ? ?Wt Readings from Last 3 Encounters:  ?02/18/22 166 lb (75.3 kg)  ?01/31/22 178 lb (80.7 kg)  ?01/31/22 178 lb (80.7 kg)  ? ? ? ?Review of Systems ?See HPI  ? ?Past Medical History:  ?Diagnosis Date  ? Anxiety   ? Asthma   ? Chicken pox   ? Complication of anesthesia   ? slow to wake up from anesthesia  ? DJD (degenerative joint disease)   ? GERD (gastroesophageal reflux disease)   ? resolved after gastric surgery  ? H/O gastric bypass 2016  ? History of iron deficiency anemia   ? HSV infection   ? Migraines   ? Obese   ? Pneumonia   ? Childhood history  ? PONV (postoperative nausea and vomiting)   ? Varicose vein of leg   ? ? ?Social History  ? ?Socioeconomic History  ? Marital status: Married  ?  Spouse name:  Not on file  ? Number of children: Not on file  ? Years of education: Not on file  ? Highest education level: Associate degree: academic program  ?Occupational History  ? Not on file  ?Tobacco Use  ? Smoking status: Never  ? Smokeless tobacco: Never  ?Vaping Use  ? Vaping Use: Never used  ?Substance and Sexual Activity  ? Alcohol use: Yes  ?  Comment: Social/Rare  ? Drug use: Never  ? Sexual activity: Yes  ?  Birth control/protection: Surgical  ?Other Topics Concern  ? Not on file  ?Social History Narrative  ? Not on file  ? ?Social Determinants of Health  ? ?Financial Resource Strain: Medium Risk  ? Difficulty of Paying Living Expenses: Somewhat hard  ?Food Insecurity: No Food Insecurity  ? Worried About Charity fundraiser in the Last Year: Never true  ? Ran Out of Food in the Last Year: Never true  ?Transportation Needs: No Transportation Needs  ? Lack of Transportation (Medical): No  ? Lack of Transportation (Non-Medical): No  ?Physical Activity: Sufficiently Active  ? Days of Exercise per Week: 5 days  ? Minutes of Exercise per Session: 150+ min  ?Stress: Stress Concern Present  ? Feeling of Stress : Very much  ?Social Connections: Moderately Integrated  ? Frequency  of Communication with Friends and Family: More than three times a week  ? Frequency of Social Gatherings with Friends and Family: Never  ? Attends Religious Services: More than 4 times per year  ? Active Member of Clubs or Organizations: Yes  ? Attends Archivist Meetings: More than 4 times per year  ? Marital Status: Separated  ?Intimate Partner Violence: Not on file  ? ? ?Past Surgical History:  ?Procedure Laterality Date  ? ANKLE FRACTURE SURGERY Left 03/08/2009  ? BREAST SURGERY Bilateral   ? Cyst Removal  ? GASTRIC BYPASS  06/18/2015  ? LAPAROSCOPIC VAGINAL HYSTERECTOMY WITH SALPINGECTOMY Bilateral 05/15/2020  ? Procedure: LAPAROSCOPIC ASSISTED VAGINAL HYSTERECTOMY WITH SALPINGECTOMY;  Surgeon: Louretta Shorten, MD;  Location: [facility];  Service: Gynecology;  Laterality: Bilateral;  need bed  ? LASER ABLATION    ? Varicose veins  ? TONSILLECTOMY AND ADENOIDECTOMY  2001  ? TUBAL LIGATION  12/19/1999  ? UPPER GI ENDOSCOPY    ? Uterine ablation  12/2018  ? ? ?Family History  ?Problem Relation Age of Onset  ? COPD Mother   ? Heart disease Mother   ? Alcohol abuse Mother   ? Depression Mother   ? Drug abuse Mother   ? High Cholesterol Mother   ? High blood pressure Mother   ? COPD Father   ? Aneurysm Father   ? Heart disease Father   ? Early death Father   ? Diabetes Father   ? Drug abuse Father   ? AAA (abdominal aortic aneurysm) Father   ?     cause of death at 25  ? Arthritis Sister   ? Kidney disease Sister   ? Asthma Neg Hx   ? Allergic rhinitis Neg Hx   ? ? ?Allergies  ?Allergen Reactions  ? Cyclobenzaprine Shortness Of Breath and Other (See Comments)  ?  Relaxed tongue ?  ? Shrimp Flavor Anaphylaxis  ? Sulfa Antibiotics Anaphylaxis, Hives and Swelling  ? Carisoprodol Hives  ? Metronidazole   ?  not allergic; drug doesn't work for pt  ? Morphine Other (See Comments)  ?  Feels like her head is going to "blow up" ? ?  ? Topiramate   ?  Brain fog  ? Adhesive [Tape] Rash  ?  Other reaction(s): SKIN - rash (skin burns) PAPER TAPE IS OK ?  ? Tramadol Itching, Nausea And Vomiting and Rash  ? ? ?Current Outpatient Medications on File Prior to Visit  ?Medication Sig Dispense Refill  ? acetaminophen (TYLENOL) 500 MG tablet Take by mouth.    ? acyclovir ointment (ZOVIRAX) 5 % Apply topically every 3 (three) hours as directed. 30 g 1  ? acyclovir ointment (ZOVIRAX) 5 % Apply onto the affected area every 3 hours. 15 g 1  ? albuterol (VENTOLIN HFA) 108 (90 Base) MCG/ACT inhaler Inhale 1-2 puffs into the lungs every 6 (six) hours as needed for wheezing or shortness of breath. 54 g 0  ? amitriptyline (ELAVIL) 10 MG tablet Take 1 tablet (10 mg total) by mouth at bedtime. 90 tablet 1  ? azelastine (ASTELIN) 0.1 % nasal spray Place 2 sprays into both  nostrils 2 (two) times daily. 30 mL 5  ? Azelastine-Fluticasone (DYMISTA) 137-50 MCG/ACT SUSP Place 2 sprays into both nostrils 2 (two) times daily as needed. 23 g 5  ? CALCIUM PO Take 1 tablet by mouth 4 (four) times a week.    ? cetirizine (ZYRTEC) 10 MG tablet Take 2  tablets (20 mg total) by mouth 2 (two) times daily. 360 tablet 1  ? Cyanocobalamin (NASCOBAL NA) Place 1 spray into the nose every Sunday.    ? diazepam (VALIUM) 10 MG tablet Take 1 tablet (10 mg total) by mouth at bedtime as needed for sleep. 30 tablet 2  ? EPINEPHrine 0.3 mg/0.3 mL IJ SOAJ injection Inject 0.3 mg into the muscle as needed for anaphylaxis. 2 each 1  ? fluconazole (DIFLUCAN) 150 MG tablet Take 1 tablet (150 mg total) by mouth daily. 14 tablet 2  ? fluticasone (FLONASE) 50 MCG/ACT nasal spray Place 2 sprays into both nostrils daily. 16 g 5  ? Fluticasone-Umeclidin-Vilant (TRELEGY ELLIPTA) 200-62.5-25 MCG/ACT AEPB Inhale 1 puff into the lungs daily. 60 each 5  ? Galcanezumab-gnlm (EMGALITY) 120 MG/ML SOAJ Inject 120 mg into the skin every 30 (thirty) days. 1 mL 11  ? Hypertonic Nasal Wash (SINUS RINSE REFILL) PACK Use one packet dissolved in water as needed. (Patient taking differently: Place 1 each into the nose in the morning and at bedtime.) 100 each 5  ? ibuprofen (ADVIL) 800 MG tablet Take 1 tablet (800 mg total) by mouth 3 (three) times daily as needed (pain.). 270 tablet 0  ? ipratropium (ATROVENT) 0.06 % nasal spray Place 2 sprays into both nostrils 4 (four) times daily. 45 mL 3  ? lactulose, encephalopathy, (CHRONULAC) 10 GM/15ML SOLN Take 30 mLs (20 g total) by mouth daily as needed. 450 mL 0  ? meclizine (ANTIVERT) 25 MG tablet Take 25 mg by mouth 3 (three) times daily as needed (dizziness/vertigo).     ? meloxicam (MOBIC) 15 MG tablet Take 1 tablet (15 mg total) by mouth daily. 30 tablet 0  ? MUCUS RELIEF ER 600 MG 12 hr tablet Take 600-1,200 mg by mouth 2 (two) times daily as needed (congestion).     ? ondansetron  (ZOFRAN-ODT) 4 MG disintegrating tablet Take 1-2 tablets (4-8 mg total) by mouth every 8 (eight) hours as needed. May take with Rizatriptan 30 tablet 3  ? Pediatric Multiple Vit-C-FA (MULTIVITAMIN ANIMAL SHAP

## 2022-02-19 ENCOUNTER — Ambulatory Visit: Payer: 59 | Admitting: Adult Health

## 2022-02-19 ENCOUNTER — Other Ambulatory Visit: Payer: Self-pay | Admitting: Adult Health

## 2022-02-19 ENCOUNTER — Encounter: Payer: Self-pay | Admitting: Neurology

## 2022-02-19 ENCOUNTER — Encounter: Payer: Self-pay | Admitting: Adult Health

## 2022-02-19 ENCOUNTER — Other Ambulatory Visit (HOSPITAL_COMMUNITY): Payer: Self-pay

## 2022-02-19 MED ORDER — SERTRALINE HCL 50 MG PO TABS
50.0000 mg | ORAL_TABLET | Freq: Every day | ORAL | 0 refills | Status: DC
  Filled 2022-02-19: qty 90, 90d supply, fill #0

## 2022-02-20 ENCOUNTER — Ambulatory Visit (INDEPENDENT_AMBULATORY_CARE_PROVIDER_SITE_OTHER): Payer: 59

## 2022-02-20 ENCOUNTER — Other Ambulatory Visit: Payer: Self-pay

## 2022-02-20 DIAGNOSIS — J4550 Severe persistent asthma, uncomplicated: Secondary | ICD-10-CM | POA: Diagnosis not present

## 2022-02-20 LAB — URINE CYTOLOGY ANCILLARY ONLY
Chlamydia: NEGATIVE
Comment: NEGATIVE
Comment: NEGATIVE
Comment: NORMAL
Neisseria Gonorrhea: NEGATIVE
Trichomonas: NEGATIVE

## 2022-02-20 NOTE — Progress Notes (Signed)
? ? [name] D.Merril Abbe ?[facility] ?[place] ?Phone: [phone] ?  ?Assessment and Plan:   ?  ?1. Neck pain ?2. Somatic dysfunction of cervical region ?3. Somatic dysfunction of thoracic region ?4. Somatic dysfunction of lumbar region ?5. Somatic dysfunction of pelvic region ?6. Somatic dysfunction of rib region ?-Chronic with exacerbation, subsequent visit ?- Continued multiple musculoskeletal complaints with most prominent being in neck and trapezius at today's visit.  Overall patient did have some benefit from OMT ?- Discontinue daily meloxicam and may use remainder as needed.  Recommend using Tylenol for day-to-day pain relief ?- Continue HEP for neck and trapezius ?- Patient has received significant relief with OMT in the past.  Elects for repeat OMT today.  Tolerated well per note below. ?- Decision today to treat with OMT was based on Physical Exam ? ?After verbal consent patient was treated with HVLA (high velocity low amplitude), ME (muscle energy), FPR (flex positional release), ST (soft tissue), PC/PD (Pelvic Compression/ Pelvic Decompression) techniques in cervical, rib, thoracic, lumbar, and pelvic areas. Patient tolerated the procedure well with improvement in symptoms.  Patient educated on potential side effects of soreness and recommended to rest, hydrate, and use Tylenol as needed for pain control.  ?  ?Pertinent previous records reviewed include none ?  ?Follow Up: 2-week follow-up for repeat OMT.  Could consider trigger point injections if continued significant trapezius pain ?  ?Subjective:   ?I, [name], am serving as a Education administrator for [employer] ? ?Chief Complaint: neck and shoulder pain  ? ?HPI:  ?01/31/2022 ?Patient is a 51 year old female complaining of neck and shoulder pain. Patient states been going on for about 2 weeks has knots in her shoulders both neck and shoulders have been stiff , interested in OMT , knots  in her upper trap , low back and her sciatic nerve on her right side flares up when she's having intercourse goes down her leg and upper her back  ? ?02/21/2022 ?Patient states that her neck and her shoulders are still bothering her after her adjustment she was very tight is still tight in the upper back , still wants the adjustment will get a massage later tonight as well  , hip still hurts , her tailbone she woke up one morning with pain   ? ?  ?Relevant Historical Information: History of migraines being treated with Botox injections ? ?Additional pertinent review of systems negative. ? ? ?Current Outpatient Medications:  ?  acetaminophen (TYLENOL) 500 MG tablet, Take by mouth., Disp: , Rfl:  ?  acyclovir ointment (ZOVIRAX) 5 %, Apply topically every 3 (three) hours as directed., Disp: 30 g, Rfl: 1 ?  acyclovir ointment (ZOVIRAX) 5 %, Apply onto the affected area every 3 hours., Disp: 15 g, Rfl: 1 ?  albuterol (VENTOLIN HFA) 108 (90 Base) MCG/ACT inhaler, Inhale 1-2 puffs into the lungs every 6 (six) hours as needed for wheezing or shortness of breath., Disp: 54 g, Rfl: 0 ?  amitriptyline (ELAVIL) 10 MG tablet, Take 1 tablet (10 mg total) by mouth at bedtime., Disp: 90 tablet, Rfl: 1 ?  azelastine (ASTELIN) 0.1 % nasal spray, Place 2 sprays into both nostrils 2 (two) times daily., Disp: 30 mL, Rfl: 5 ?  Azelastine-Fluticasone (DYMISTA) 137-50 MCG/ACT SUSP, Place 2 sprays into both nostrils 2 (two) times daily as needed., Disp: 23 g, Rfl: 5 ?  CALCIUM PO, Take 1 tablet by mouth 4 (four) times  a week., Disp: , Rfl:  ?  cetirizine (ZYRTEC) 10 MG tablet, Take 2 tablets (20 mg total) by mouth 2 (two) times daily., Disp: 360 tablet, Rfl: 1 ?  Cyanocobalamin (NASCOBAL NA), Place 1 spray into the nose every Sunday., Disp: , Rfl:  ?  diazepam (VALIUM) 10 MG tablet, Take 1 tablet (10 mg total) by mouth at bedtime as needed for sleep., Disp: 30 tablet, Rfl: 2 ?  EPINEPHrine 0.3 mg/0.3 mL IJ SOAJ injection, Inject 0.3 mg into  the muscle as needed for anaphylaxis., Disp: 2 each, Rfl: 1 ?  fluconazole (DIFLUCAN) 150 MG tablet, Take 1 tablet (150 mg total) by mouth daily., Disp: 14 tablet, Rfl: 2 ?  fluticasone (FLONASE) 50 MCG/ACT nasal spray, Place 2 sprays into both nostrils daily., Disp: 16 g, Rfl: 5 ?  fluticasone-salmeterol (ADVAIR HFA) 230-21 MCG/ACT inhaler, INHALE 2 PUFFS INTO THE LUNGS 2 (TWO) TIMES DAILY., Disp: 36 g, Rfl: 1 ?  Fluticasone-Umeclidin-Vilant (TRELEGY ELLIPTA) 200-62.5-25 MCG/ACT AEPB, Inhale 1 puff into the lungs daily., Disp: 60 each, Rfl: 5 ?  Galcanezumab-gnlm (EMGALITY) 120 MG/ML SOAJ, Inject 120 mg into the skin every 30 (thirty) days., Disp: 1 mL, Rfl: 11 ?  Hypertonic Nasal Wash (SINUS RINSE REFILL) PACK, Use one packet dissolved in water as needed. (Patient taking differently: Place 1 each into the nose in the morning and at bedtime.), Disp: 100 each, Rfl: 5 ?  ibuprofen (ADVIL) 800 MG tablet, Take 1 tablet (800 mg total) by mouth 3 (three) times daily as needed (pain.)., Disp: 270 tablet, Rfl: 0 ?  IMVEXXY MAINTENANCE PACK 10 MCG INST, , Disp: , Rfl:  ?  ipratropium (ATROVENT) 0.06 % nasal spray, Place 2 sprays into both nostrils 4 (four) times daily., Disp: 45 mL, Rfl: 3 ?  lactulose, encephalopathy, (CHRONULAC) 10 GM/15ML SOLN, Take 30 mLs (20 g total) by mouth daily as needed., Disp: 450 mL, Rfl: 0 ?  meclizine (ANTIVERT) 25 MG tablet, Take 25 mg by mouth 3 (three) times daily as needed (dizziness/vertigo). , Disp: , Rfl:  ?  meloxicam (MOBIC) 15 MG tablet, Take 1 tablet (15 mg total) by mouth daily., Disp: 30 tablet, Rfl: 0 ?  MUCUS RELIEF ER 600 MG 12 hr tablet, Take 600-1,200 mg by mouth 2 (two) times daily as needed (congestion). , Disp: , Rfl:  ?  ondansetron (ZOFRAN-ODT) 4 MG disintegrating tablet, Take 1-2 tablets (4-8 mg total) by mouth every 8 (eight) hours as needed. May take with Rizatriptan, Disp: 30 tablet, Rfl: 3 ?  pantoprazole (PROTONIX) 40 MG tablet, TAKE 1 TABLET (40 MG TOTAL) BY MOUTH  AT BEDTIME., Disp: 90 tablet, Rfl: 3 ?  Pediatric Multiple Vit-C-FA (MULTIVITAMIN ANIMAL SHAPES, WITH CA/FA,) with C & FA chewable tablet, Chew 2 tablets by mouth daily., Disp: , Rfl:  ?  rizatriptan (MAXALT-MLT) 10 MG disintegrating tablet, Dissolve 1 tablet (10 mg total) by mouth as needed for migraine. May repeat in 2 hours if needed, Disp: 9 tablet, Rfl: 11 ?  rizatriptan (MAXALT-MLT) 10 MG disintegrating tablet, Dissolve 1 tablet (10 mg total) by mouth as needed for migraine. May repeat in 2 hours if needed, Disp: 27 tablet, Rfl: 4 ?  sertraline (ZOLOFT) 50 MG tablet, Take 1 tablet (50 mg total) by mouth daily., Disp: 90 tablet, Rfl: 0 ?  simethicone (MYLICON) 80 MG chewable tablet, Chew 80 mg by mouth every 6 (six) hours as needed for flatulence., Disp: , Rfl:  ?  tezepelumab-ekko (TEZSPIRE) 210 MG/1.91ML syringe, Inject 1.91 mLs (  210 mg total) into the skin every 28 (twenty-eight) days., Disp: 1.91 mL, Rfl: 11 ?  tinidazole (TINDAMAX) 500 MG tablet, Take 2 tablets (1,000 mg total) by mouth daily with breakfast for 5 days, Disp: 10 tablet, Rfl: 0 ?  tiZANidine (ZANAFLEX) 4 MG tablet, Take 1 tablet by mouth every 8  hours as needed., Disp: 10 tablet, Rfl: 0 ?  triamcinolone ointment (KENALOG) 0.1 %, APPLY TO AFFECTED AREA(S) 2 TIMES A DAY, Disp: 454 g, Rfl: 2 ?  valACYclovir (VALTREX) 500 MG tablet, Take 2 tablets (1,000 mg total) by mouth at bedtime., Disp: 180 tablet, Rfl: 3 ?  Vibegron (GEMTESA) 75 MG TABS, Take 1 tablet by mouth daily., Disp: 30 tablet, Rfl: 11 ?  Vitamin D, Ergocalciferol, (DRISDOL) 1.25 MG (50000 UNIT) CAPS capsule, TAKE 1 CAPSULE BY MOUTH ONCE A WEEK., Disp: 12 capsule, Rfl: 3 ? ?Current Facility-Administered Medications:  ?  tezepelumab-ekko (TEZSPIRE) 210 MG/1.91ML syringe 210 mg, 210 mg, Subcutaneous, Q28 days, Valentina Shaggy, MD, 210 mg at 02/20/22 1735  ? ?Objective:   ?  ?Vitals:  ? 02/21/22 0809  ?BP: 118/78  ?Pulse: 84  ?SpO2: 99%  ?Weight: 166 lb (75.3 kg)  ?Height: '5\' 6"'$   (1.676 m)  ?  ?  ?Body mass index is 26.79 kg/m?.  ?  ?Physical Exam:   ? ?General: Well-appearing, cooperative, sitting comfortably in no acute distress.  ? ?OMT Physical Exam: ? ?ASIS Compression

## 2022-02-21 ENCOUNTER — Other Ambulatory Visit (HOSPITAL_COMMUNITY): Payer: Self-pay

## 2022-02-21 ENCOUNTER — Encounter: Payer: Self-pay | Admitting: Gastroenterology

## 2022-02-21 ENCOUNTER — Ambulatory Visit (INDEPENDENT_AMBULATORY_CARE_PROVIDER_SITE_OTHER): Payer: 59 | Admitting: Sports Medicine

## 2022-02-21 ENCOUNTER — Ambulatory Visit (INDEPENDENT_AMBULATORY_CARE_PROVIDER_SITE_OTHER): Payer: 59 | Admitting: Gastroenterology

## 2022-02-21 VITALS — BP 118/78 | HR 84 | Ht 66.0 in | Wt 166.0 lb

## 2022-02-21 VITALS — BP 116/74 | HR 61 | Ht 66.0 in | Wt 169.8 lb

## 2022-02-21 DIAGNOSIS — R634 Abnormal weight loss: Secondary | ICD-10-CM | POA: Diagnosis not present

## 2022-02-21 DIAGNOSIS — M9901 Segmental and somatic dysfunction of cervical region: Secondary | ICD-10-CM

## 2022-02-21 DIAGNOSIS — Z9884 Bariatric surgery status: Secondary | ICD-10-CM | POA: Diagnosis not present

## 2022-02-21 DIAGNOSIS — R1012 Left upper quadrant pain: Secondary | ICD-10-CM | POA: Diagnosis not present

## 2022-02-21 DIAGNOSIS — M9908 Segmental and somatic dysfunction of rib cage: Secondary | ICD-10-CM | POA: Diagnosis not present

## 2022-02-21 DIAGNOSIS — R194 Change in bowel habit: Secondary | ICD-10-CM | POA: Diagnosis not present

## 2022-02-21 DIAGNOSIS — M9903 Segmental and somatic dysfunction of lumbar region: Secondary | ICD-10-CM | POA: Diagnosis not present

## 2022-02-21 DIAGNOSIS — M542 Cervicalgia: Secondary | ICD-10-CM | POA: Diagnosis not present

## 2022-02-21 DIAGNOSIS — M9902 Segmental and somatic dysfunction of thoracic region: Secondary | ICD-10-CM | POA: Diagnosis not present

## 2022-02-21 DIAGNOSIS — M9905 Segmental and somatic dysfunction of pelvic region: Secondary | ICD-10-CM | POA: Diagnosis not present

## 2022-02-21 MED ORDER — PLENVU 140 G PO SOLR
140.0000 g | ORAL | 0 refills | Status: DC
  Filled 2022-02-21: qty 3, 1d supply, fill #0

## 2022-02-21 NOTE — Patient Instructions (Addendum)
Good to see you  ?2 week follow up for repeat OMT and potentially trigger point injections ?

## 2022-02-21 NOTE — Progress Notes (Signed)
? ? ? ?02/21/2022 ?Donna Park ?[id number] ?Jul 07, 1971 ? ? ?HISTORY OF PRESENT ILLNESS: This is a 51 year old female who is new to our office.  She was initially referred here by Adriana Mccallum, NP, for screening colonoscopy.  She has never had colonoscopy in the past.  She is a Equities trader, is a Psychologist, sport and exercise.  While she is here today she tells me that she has been having a lot of GI issues recently.  She says that typically she is constipated, but over the last several weeks has been having more loose stools and diarrhea.  She is status post Roux-en-Y gastric bypass surgery in July 2016 in [place].  She says that she has been feeling really bloated.  Has some left upper quadrant abdominal discomfort that she describes as burning.  She usually notices it if she feels like she has to have a BM.  Tells me that she had an ulcer in the past.  She she is concerned because she has been dropping more weight without trying.  Says that she is lost about 8 pounds in the last couple weeks.  She has been under a lot of stress with separating from her boyfriend.  She is on pantoprazole 40 mg daily, but takes that at bedtime.  Uses only rare ibuprofen.  She says that she has a sister with Crohn's disease. ? ? ?Past Medical History:  ?Diagnosis Date  ? Anxiety   ? Arthritis   ? Asthma   ? Chicken pox   ? Complication of anesthesia   ? slow to wake up from anesthesia  ? Depression   ? DJD (degenerative joint disease)   ? GERD (gastroesophageal reflux disease)   ? resolved after gastric surgery  ? H/O gastric bypass 2016  ? History of iron deficiency anemia   ? HSV infection   ? Migraines   ? Obese   ? Pneumonia   ? Childhood history  ? PONV (postoperative nausea and vomiting)   ? Status post dilation of esophageal narrowing   ? Varicose vein of leg   ? ?Past Surgical History:  ?Procedure Laterality Date  ? ANKLE FRACTURE SURGERY Left 03/08/2009  ? BREAST SURGERY Bilateral   ? Cyst Removal  ? GASTRIC BYPASS   06/18/2015  ? LAPAROSCOPIC VAGINAL HYSTERECTOMY WITH SALPINGECTOMY Bilateral 05/15/2020  ? Procedure: LAPAROSCOPIC ASSISTED VAGINAL HYSTERECTOMY WITH SALPINGECTOMY;  Surgeon: Louretta Shorten, MD;  Location: [facility];  Service: Gynecology;  Laterality: Bilateral;  need bed  ? LASER ABLATION    ? Varicose veins  ? TONSILLECTOMY AND ADENOIDECTOMY  2001  ? TUBAL LIGATION  12/19/1999  ? UPPER GI ENDOSCOPY    ? Uterine ablation  12/2018  ? ? reports that she has never smoked. She has never used smokeless tobacco. She reports current alcohol use. She reports that she does not use drugs. ?family history includes AAA (abdominal aortic aneurysm) in her father; Alcohol abuse in her mother; Aneurysm in her father; Arthritis in her sister; COPD in her father and mother; Crohn's disease in her sister; Depression in her mother; Diabetes in her father and paternal aunt; Drug abuse in her father and mother; Early death in her father; Heart disease in her father and mother; High Cholesterol in her mother; High blood pressure in her mother; Kidney disease in her brother, maternal aunt, and sister; Thyroid disease in her sister; Ulcerative colitis in her sister. ?Allergies  ?Allergen Reactions  ? Cyclobenzaprine Shortness Of Breath and Other (See Comments)  ?  Relaxed tongue ?  ? Shrimp Flavor Anaphylaxis  ? Sulfa Antibiotics Anaphylaxis, Hives and Swelling  ? Carisoprodol Hives  ? Metronidazole   ?  not allergic; drug doesn't work for pt  ? Morphine Other (See Comments)  ?  Feels like her head is going to "blow up" ? ?  ? Topiramate   ?  Brain fog  ? Adhesive [Tape] Rash  ?  Other reaction(s): SKIN - rash (skin burns) PAPER TAPE IS OK ?  ? Tramadol Itching, Nausea And Vomiting and Rash  ? ? ?  ?Outpatient Encounter Medications as of 02/21/2022  ?Medication Sig  ? acetaminophen (TYLENOL) 500 MG tablet Take by mouth.  ? acyclovir ointment (ZOVIRAX) 5 % Apply topically every 3 (three) hours as directed.  ? acyclovir ointment  (ZOVIRAX) 5 % Apply onto the affected area every 3 hours.  ? albuterol (VENTOLIN HFA) 108 (90 Base) MCG/ACT inhaler Inhale 1-2 puffs into the lungs every 6 (six) hours as needed for wheezing or shortness of breath.  ? amitriptyline (ELAVIL) 10 MG tablet Take 1 tablet (10 mg total) by mouth at bedtime.  ? azelastine (ASTELIN) 0.1 % nasal spray Place 2 sprays into both nostrils 2 (two) times daily.  ? Azelastine-Fluticasone (DYMISTA) 137-50 MCG/ACT SUSP Place 2 sprays into both nostrils 2 (two) times daily as needed.  ? CALCIUM PO Take 1 tablet by mouth 4 (four) times a week.  ? cetirizine (ZYRTEC) 10 MG tablet Take 2 tablets (20 mg total) by mouth 2 (two) times daily.  ? Cyanocobalamin (NASCOBAL NA) Place 1 spray into the nose every Sunday.  ? diazepam (VALIUM) 10 MG tablet Take 1 tablet (10 mg total) by mouth at bedtime as needed for sleep.  ? EPINEPHrine 0.3 mg/0.3 mL IJ SOAJ injection Inject 0.3 mg into the muscle as needed for anaphylaxis.  ? fluconazole (DIFLUCAN) 150 MG tablet Take 1 tablet (150 mg total) by mouth daily.  ? fluticasone (FLONASE) 50 MCG/ACT nasal spray Place 2 sprays into both nostrils daily.  ? Fluticasone-Umeclidin-Vilant (TRELEGY ELLIPTA) 200-62.5-25 MCG/ACT AEPB Inhale 1 puff into the lungs daily.  ? Galcanezumab-gnlm (EMGALITY) 120 MG/ML SOAJ Inject 120 mg into the skin every 30 (thirty) days.  ? Hypertonic Nasal Wash (SINUS RINSE REFILL) PACK Use one packet dissolved in water as needed. (Patient taking differently: Place 1 each into the nose in the morning and at bedtime.)  ? ibuprofen (ADVIL) 800 MG tablet Take 1 tablet (800 mg total) by mouth 3 (three) times daily as needed (pain.).  ? IMVEXXY MAINTENANCE PACK 10 MCG INST   ? ipratropium (ATROVENT) 0.06 % nasal spray Place 2 sprays into both nostrils 4 (four) times daily.  ? lactulose, encephalopathy, (CHRONULAC) 10 GM/15ML SOLN Take 30 mLs (20 g total) by mouth daily as needed.  ? meclizine (ANTIVERT) 25 MG tablet Take 25 mg by mouth 3  (three) times daily as needed (dizziness/vertigo).   ? meloxicam (MOBIC) 15 MG tablet Take 1 tablet (15 mg total) by mouth daily.  ? MUCUS RELIEF ER 600 MG 12 hr tablet Take 600-1,200 mg by mouth 2 (two) times daily as needed (congestion).   ? ondansetron (ZOFRAN-ODT) 4 MG disintegrating tablet Take 1-2 tablets (4-8 mg total) by mouth every 8 (eight) hours as needed. May take with Rizatriptan  ? Pediatric Multiple Vit-C-FA (MULTIVITAMIN ANIMAL SHAPES, WITH CA/FA,) with C & FA chewable tablet Chew 2 tablets by mouth daily.  ? rizatriptan (MAXALT-MLT) 10 MG disintegrating tablet Dissolve 1 tablet (10 mg total)  by mouth as needed for migraine. May repeat in 2 hours if needed  ? rizatriptan (MAXALT-MLT) 10 MG disintegrating tablet Dissolve 1 tablet (10 mg total) by mouth as needed for migraine. May repeat in 2 hours if needed  ? sertraline (ZOLOFT) 50 MG tablet Take 1 tablet (50 mg total) by mouth daily.  ? simethicone (MYLICON) 80 MG chewable tablet Chew 80 mg by mouth every 6 (six) hours as needed for flatulence.  ? tezepelumab-ekko (TEZSPIRE) 210 MG/1.91ML syringe Inject 1.91 mLs (210 mg total) into the skin every 28 (twenty-eight) days.  ? tinidazole (TINDAMAX) 500 MG tablet Take 2 tablets (1,000 mg total) by mouth daily with breakfast for 5 days  ? tiZANidine (ZANAFLEX) 4 MG tablet Take 1 tablet by mouth every 8  hours as needed.  ? triamcinolone ointment (KENALOG) 0.1 % APPLY TO AFFECTED AREA(S) 2 TIMES A DAY  ? valACYclovir (VALTREX) 500 MG tablet Take 2 tablets (1,000 mg total) by mouth at bedtime.  ? Vibegron (GEMTESA) 75 MG TABS Take 1 tablet by mouth daily.  ? Vitamin D, Ergocalciferol, (DRISDOL) 1.25 MG (50000 UNIT) CAPS capsule TAKE 1 CAPSULE BY MOUTH ONCE A WEEK.  ? fluticasone-salmeterol (ADVAIR HFA) 230-21 MCG/ACT inhaler INHALE 2 PUFFS INTO THE LUNGS 2 (TWO) TIMES DAILY.  ? pantoprazole (PROTONIX) 40 MG tablet TAKE 1 TABLET (40 MG TOTAL) BY MOUTH AT BEDTIME.  ? ?Facility-Administered Encounter  Medications as of 02/21/2022  ?Medication  ? tezepelumab-ekko (TEZSPIRE) 210 MG/1.91ML syringe 210 mg  ? ? ? ?REVIEW OF SYSTEMS  : All other systems reviewed and negative except where noted in the History of Present I

## 2022-02-24 ENCOUNTER — Other Ambulatory Visit: Payer: Self-pay

## 2022-02-24 ENCOUNTER — Ambulatory Visit (INDEPENDENT_AMBULATORY_CARE_PROVIDER_SITE_OTHER): Payer: 59 | Admitting: Family Medicine

## 2022-02-24 ENCOUNTER — Encounter: Payer: Self-pay | Admitting: Family Medicine

## 2022-02-24 VITALS — BP 127/90 | HR 88 | Ht 66.0 in | Wt 172.5 lb

## 2022-02-24 DIAGNOSIS — G43709 Chronic migraine without aura, not intractable, without status migrainosus: Secondary | ICD-10-CM | POA: Diagnosis not present

## 2022-02-24 NOTE — Progress Notes (Signed)
? ? ?Chief Complaint  ?Patient presents with  ? Follow-up  ?  Rm 1, alone. Here for migraine f/u. Pt would like to discuss new medication, sertraline. Pt reports waking up w a migraine, under lots of stress.   ? ? ?HISTORY OF PRESENT ILLNESS: ? ?02/24/22 ALL:  ?Donna Park returns for follow up for migraines. She was last seen by Donna Park in 10/2021 and started on Botox therapy. She had second procedure 01/14/2022. She reports significant improvement in migraines. She feels migraines are 85-90% improved. She continues Emgality monthly, amitriptyline '10mg'$  QHS and rizatriptan as well. She reports maybe 2-3 migraines since 11/2021.  ? ?She was recently started on sertraline '50mg'$  daily for increased stress. She is concerned about taking this with amitriptyline. She also takes diazepam '10mg'$  at bedtime for muscle relaxer.  ? ?10/09/2021 AA: I gave [name] that are pulsating, pounding, throbbing, unilateral with nausea, phonophobia and photophobia and can last upwards of 24 hours.  No aura.  No medication overuse.  Ongoing for over a year at this frequency, severity.  Affecting her life.  At this time we discussed trying a different CGRP or continuing to Botox, she would like to try Botox. ? ?09/26/2021 ALL: ?Donna Park is a 51 y.o. female here today for follow up for migraines. She was started on topiramate and Emgality at last visit in 06/2021. She did not like side effects of topiramate and she does not feel Emgality has been effective after loading dose and three monthly doses. She does feel that it has helped some. She is having daily headaches. She has 10-12 migraines a month. Migraines can last 5-7 days. Rizatriptan helps. She is dealing with more stress. She lost her father in August. She does not sleep well. Amitriptyline '50mg'$  made her too sleepy after  two doses. Trazodone gave her a really bad headache after one dose. Topiramate gave her brain fog after three doses.  ? ?Medications tried that can be used in migraine management include: Emgality (ineffective), topiramate (can not tolerate), amitriptyline(cannot tolerate), Zomig, sumatriptan, ibuprofen, ondansetron, blood pressure medications contraindicated due to hypotension ? ?HISTORY (copied from Donna Donna Park previous note) ? ?HPI:  Donna Park is a 51 y.o. female here as requested by Donna Peng, NP for migraine. Has had migraines since a little kid, extensive family history, her mother, brother, 51 of her sons all have migraines. She has headaches 25 days a month and 8 are moderatel to severe migraines that can last for 24-72 hours for over a year. Ibuprofen and goody powders help but she may be taking them daily, too much. She has lost 145 pounds. Headaches start start unilateral but can also be bilateral, pulsating/pounding/throbbing, spreads to the whole head, light/sound sensitivity, nausea, zofran to help stop vomiting, she can wake up with headaches. Imitrex makes her skin very sensitive. She sometimes has an aura. She is having blurry vision and she has loss of vision a black spot. Headaches change and can be worse positionally with blurred vision and she can wake with them.Worsening in quality and severity.  No other focal neurologic deficits, associated symptoms, inciting events or modifiable factors. ?  ?Reviewed notes, labs and imaging from outside physicians, which showed: ?  ?From a thorough review of records, medications tried that ca be used in migraine management include: Amitriptyline(cannot tolerate),  sumatriptan, ibuprofen, ondansetron, blood pressure medications contraindicated due to hypotension, zomig, topamax,  ?02/2021: cbc unremarkable, cmp nml, tsh 2.21, hgba1c 5.4 ? ? ?REVIEW OF SYSTEMS: Out of a complete 14 system review of symptoms, the patient complains only of the following  symptoms, headaches, stress and all other reviewed systems are negative. ? ? ?ALLERGIES: ?Allergies  ?Allergen Reactions  ? Cyclobenzaprine Shortness Of Breath and Other (See Comments)  ?  Relaxed tongue ?  ? Shrimp Flavor Anaphylaxis  ? Sulfa Antibiotics Anaphylaxis, Hives and Swelling  ? Carisoprodol Hives  ? Metronidazole   ?  not allergic; drug doesn't work for pt  ? Morphine Other (See Comments)  ?  Feels like her head is going to "blow up" ? ?  ? Topiramate   ?  Brain fog  ? Adhesive [Tape] Rash  ?  Other reaction(s): SKIN - rash (skin burns) PAPER TAPE IS OK ?  ? Tramadol Itching, Nausea And Vomiting and Rash  ? ? ? ?HOME MEDICATIONS: ?Outpatient Medications Prior to Visit  ?Medication Sig Dispense Refill  ? acetaminophen (TYLENOL) 500 MG tablet Take by mouth.    ? acyclovir ointment (ZOVIRAX) 5 % Apply onto the affected area every 3 hours. 15 g 1  ? albuterol (VENTOLIN HFA) 108 (90 Base) MCG/ACT inhaler Inhale 1-2 puffs into the lungs every 6 (six) hours as needed for wheezing or shortness of breath. 54 g 0  ? amitriptyline (ELAVIL) 10 MG tablet Take 1 tablet (10 mg total) by mouth at bedtime. 90 tablet 1  ? azelastine (ASTELIN) 0.1 % nasal spray Place 2 sprays into both nostrils 2 (two) times daily. 30 mL 5  ? Azelastine-Fluticasone (DYMISTA) 137-50 MCG/ACT SUSP Place 2 sprays into both nostrils 2 (two) times daily as needed. 23 g 5  ? CALCIUM PO Take 1 tablet by mouth 4 (four) times a week.    ? cetirizine (ZYRTEC) 10 MG tablet Take 2 tablets (20 mg total) by mouth 2 (two) times daily. 360 tablet 1  ? Cyanocobalamin (NASCOBAL NA) Place 1 spray into the nose every Sunday.    ? diazepam (VALIUM) 10 MG tablet Take 1 tablet (10 mg total) by mouth at bedtime as needed for sleep. 30 tablet 2  ? EPINEPHrine 0.3 mg/0.3 mL IJ SOAJ injection Inject 0.3 mg into the muscle as needed for anaphylaxis. 2 each 1  ? fluconazole (DIFLUCAN) 150 MG tablet Take 1 tablet (150 mg total) by mouth daily. 14 tablet 2  ? fluticasone  (FLONASE) 50 MCG/ACT nasal spray Place 2 sprays into both nostrils daily. 16 g 5  ? Fluticasone-Umeclidin-Vilant (TRELEGY ELLIPTA) 200-62.5-25 MCG/ACT AEPB Inhale 1 puff into the lungs daily. 60 each 5  ? Galcanezumab-gnlm (EMGALITY) 120 MG/ML SOAJ Inject 120 mg into the skin every 30 (thirty) days. 1 mL 11  ? Hypertonic Nasal Wash (SINUS RINSE REFILL) PACK Use one packet dissolved in water as needed. (Patient taking differently: Place 1 each into the nose in the morning and at bedtime.) 100 each 5  ? ibuprofen (ADVIL) 800 MG tablet Take 1 tablet (800 mg total) by mouth 3 (three) times daily as needed (pain.). 270 tablet 0  ? ipratropium (ATROVENT) 0.06 % nasal spray Place 2 sprays into both nostrils 4 (four) times daily. 45 mL 3  ? lactulose, encephalopathy, (CHRONULAC) 10 GM/15ML SOLN Take 30 mLs (20 g total) by mouth daily as needed. 450 mL 0  ? meclizine (ANTIVERT) 25 MG tablet Take 25 mg by mouth 3 (three) times daily  as needed (dizziness/vertigo).     ? meloxicam (MOBIC) 15 MG tablet Take 1 tablet (15 mg total) by mouth daily. 30 tablet 0  ? MUCUS RELIEF ER 600 MG 12 hr tablet Take 600-1,200 mg by mouth 2 (two) times daily as needed (congestion).     ? ondansetron (ZOFRAN-ODT) 4 MG disintegrating tablet Take 1-2 tablets (4-8 mg total) by mouth every 8 (eight) hours as needed. May take with Rizatriptan 30 tablet 3  ? Pediatric Multiple Vit-C-FA (MULTIVITAMIN ANIMAL SHAPES, WITH CA/FA,) with C & FA chewable tablet Chew 2 tablets by mouth daily.    ? PEG-KCl-NaCl-NaSulf-Na Asc-C (PLENVU) 140 g SOLR Take 140 g by mouth as directed. 3 each 0  ? rizatriptan (MAXALT-MLT) 10 MG disintegrating tablet Dissolve 1 tablet (10 mg total) by mouth as needed for migraine. May repeat in 2 hours if needed 27 tablet 4  ? sertraline (ZOLOFT) 50 MG tablet Take 1 tablet (50 mg total) by mouth daily. 90 tablet 0  ? simethicone (MYLICON) 80 MG chewable tablet Chew 80 mg by mouth every 6 (six) hours as needed for flatulence.    ?  tinidazole (TINDAMAX) 500 MG tablet Take 2 tablets (1,000 mg total) by mouth daily with breakfast for 5 days 10 tablet 0  ? tiZANidine (ZANAFLEX) 4 MG tablet Take 1 tablet by mouth every 8  hours as needed.

## 2022-02-24 NOTE — Patient Instructions (Signed)
Below is our plan: ? ?We will continue Botox every 12 weeks, Emgality monthly and rizatriptan as needed. Continue amitriptyline for the next 4-6 weeks. If feeling well, decrease amitriptyline to '10mg'$  every other night for 2-3 weeks then '10mg'$  every 3rd night for 1-2 weeks then stop. Continue close follow up with PCP.  ? ?Please make sure you are staying well hydrated. I recommend 50-60 ounces daily. Well balanced diet and regular exercise encouraged. Consistent sleep schedule with 6-8 hours recommended.  ? ?Please continue follow up with care team as directed.  ? ?Follow up with me in 1 year for office visit if you stay in [place]  ? ?You may receive a survey regarding today's visit. I encourage you to leave honest feed back as I do use this information to improve patient care. Thank you for seeing me today!  ? ? ?

## 2022-02-26 ENCOUNTER — Other Ambulatory Visit (HOSPITAL_COMMUNITY): Payer: Self-pay

## 2022-02-27 ENCOUNTER — Telehealth: Payer: Self-pay | Admitting: Neurology

## 2022-02-27 NOTE — Telephone Encounter (Signed)
Patient scheduled to come in on 05/04 for Botox. Faxed clinicals to [facility] @ [phone]. Pending #[phone]. ?

## 2022-03-01 ENCOUNTER — Encounter: Payer: Self-pay | Admitting: Gastroenterology

## 2022-03-01 DIAGNOSIS — R1012 Left upper quadrant pain: Secondary | ICD-10-CM | POA: Insufficient documentation

## 2022-03-01 DIAGNOSIS — R194 Change in bowel habit: Secondary | ICD-10-CM | POA: Insufficient documentation

## 2022-03-01 DIAGNOSIS — R634 Abnormal weight loss: Secondary | ICD-10-CM | POA: Insufficient documentation

## 2022-03-05 NOTE — Progress Notes (Signed)
? Donna Park ?[facility] ?[place] ?Phone: [phone] ?  ?Assessment and Plan:   ?  ?1. Neck pain ?2. Somatic dysfunction of cervical region ?3. Somatic dysfunction of thoracic region ?4. Somatic dysfunction of lumbar region ?5. Somatic dysfunction of pelvic region ?6. Somatic dysfunction of rib region ?-Chronic with exacerbation, subsequent visit ?- Continued neck pain with flare and left cervical paraspinal, left trapezius since previous office visit ?- Has received relief from OMT and trigger point injections in the past, so elected to repeat both today.  Tolerated well per note below ?- Patient has received significant relief with OMT in the past.  Elects for repeat OMT today.  Tolerated well per note below. ?- Decision today to treat with OMT was based on Physical Exam ?  ?After verbal consent patient was treated with HVLA (high velocity low amplitude), ME (muscle energy), FPR (flex positional release), ST (soft tissue), PC/PD (Pelvic Compression/ Pelvic Decompression) techniques in cervical, rib, thoracic, lumbar, and pelvic areas. Patient tolerated the procedure well with improvement in symptoms.  Patient educated on potential side effects of soreness and recommended to rest, hydrate, and use Tylenol as needed for pain control. ? ?Trigger Point Injection: ?After informed consent was obtained, skin cleaned with alcohol  prep.  A total of 3 trigger points identified along left cervical paraspinal, left trapezius.  Injections given over area of pain for total injection of 2 ml lidocaine 1% w/o epi and 1 mL Kenalog 40 mg.  Patient had relief after the injection without side effects.  Pt given signs of infection to watch for. ? ? ?Pertinent previous records reviewed include none ?  ?Follow Up: 2 weeks for reevaluation.  Could consider repeat OMT +/- trigger point injections based on benefit patient received today ?  ?Subjective:   ?I, [name], am  serving as a Education administrator for [employer] ? ?Chief Complaint: neck and shoulder pain  ? ?HPI:  ?01/31/2022 ?Patient is a 51 year old female complaining of neck and shoulder pain. Patient states been going on for about 2 weeks has knots in her shoulders both neck and shoulders have been stiff , interested in OMT , knots in her upper trap , low back and her sciatic nerve on her right side flares up when she's having intercourse goes down her leg and upper her back  ?  ?02/21/2022 ?Patient states that her neck and her shoulders are still bothering her after her adjustment she was very tight is still tight in the upper back , still wants the adjustment will get a massage later tonight as well  , hip still hurts , her tailbone she woke up one morning with pain   ?  ?03/06/2022 ?Patient states still has upper trap tightness, finder her self leaning on that left side  ? ? ? ?Relevant Historical Information: History of migraines being treated with Botox injections ? ? ?Additional pertinent review of systems negative. ? ?Current Outpatient Medications  ?Medication Sig Dispense Refill  ? acetaminophen (TYLENOL) 500 MG tablet Take by mouth.    ? acyclovir ointment (ZOVIRAX) 5 % Apply onto the affected area every 3 hours. 15 g 1  ? albuterol (VENTOLIN HFA) 108 (90 Base) MCG/ACT inhaler Inhale 1-2 puffs into the lungs every 6 (six) hours as needed for wheezing or shortness of breath. 54 g 0  ? amitriptyline (ELAVIL) 10 MG tablet Take 1 tablet (10 mg total) by mouth at bedtime. 90 tablet  1  ? azelastine (ASTELIN) 0.1 % nasal spray Place 2 sprays into both nostrils 2 (two) times daily. 30 mL 5  ? Azelastine-Fluticasone (DYMISTA) 137-50 MCG/ACT SUSP Place 2 sprays into both nostrils 2 (two) times daily as needed. 23 g 5  ? CALCIUM PO Take 1 tablet by mouth 4 (four) times a week.    ? cetirizine (ZYRTEC) 10 MG tablet Take 2 tablets (20 mg total) by mouth 2 (two) times daily. 360 tablet 1  ? Cyanocobalamin (NASCOBAL NA) Place 1  spray into the nose every Sunday.    ? diazepam (VALIUM) 10 MG tablet Take 1 tablet (10 mg total) by mouth at bedtime as needed for sleep. 30 tablet 2  ? EPINEPHrine 0.3 mg/0.3 mL IJ SOAJ injection Inject 0.3 mg into the muscle as needed for anaphylaxis. 2 each 1  ? fluconazole (DIFLUCAN) 150 MG tablet Take 1 tablet (150 mg total) by mouth daily. 14 tablet 2  ? fluticasone (FLONASE) 50 MCG/ACT nasal spray Place 2 sprays into both nostrils daily. 16 g 5  ? fluticasone-salmeterol (ADVAIR HFA) 230-21 MCG/ACT inhaler INHALE 2 PUFFS INTO THE LUNGS 2 (TWO) TIMES DAILY. 36 g 1  ? Fluticasone-Umeclidin-Vilant (TRELEGY ELLIPTA) 200-62.5-25 MCG/ACT AEPB Inhale 1 puff into the lungs daily. 60 each 5  ? Galcanezumab-gnlm (EMGALITY) 120 MG/ML SOAJ Inject 120 mg into the skin every 30 (thirty) days. 1 mL 11  ? Hypertonic Nasal Wash (SINUS RINSE REFILL) PACK Use one packet dissolved in water as needed. (Patient taking differently: Place 1 each into the nose in the morning and at bedtime.) 100 each 5  ? ibuprofen (ADVIL) 800 MG tablet Take 1 tablet (800 mg total) by mouth 3 (three) times daily as needed (pain.). 270 tablet 0  ? ipratropium (ATROVENT) 0.06 % nasal spray Place 2 sprays into both nostrils 4 (four) times daily. 45 mL 3  ? lactulose, encephalopathy, (CHRONULAC) 10 GM/15ML SOLN Take 30 mLs (20 g total) by mouth daily as needed. 450 mL 0  ? meclizine (ANTIVERT) 25 MG tablet Take 25 mg by mouth 3 (three) times daily as needed (dizziness/vertigo).     ? meloxicam (MOBIC) 15 MG tablet Take 1 tablet (15 mg total) by mouth daily. 30 tablet 0  ? MUCUS RELIEF ER 600 MG 12 hr tablet Take 600-1,200 mg by mouth 2 (two) times daily as needed (congestion).     ? ondansetron (ZOFRAN-ODT) 4 MG disintegrating tablet Take 1-2 tablets (4-8 mg total) by mouth every 8 (eight) hours as needed. May take with Rizatriptan 30 tablet 3  ? pantoprazole (PROTONIX) 40 MG tablet TAKE 1 TABLET (40 MG TOTAL) BY MOUTH AT BEDTIME. 90 tablet 3  ?  Pediatric Multiple Vit-C-FA (MULTIVITAMIN ANIMAL SHAPES, WITH CA/FA,) with C & FA chewable tablet Chew 2 tablets by mouth daily.    ? PEG-KCl-NaCl-NaSulf-Na Asc-C (PLENVU) 140 g SOLR Take 140 g by mouth as directed. 3 each 0  ? rizatriptan (MAXALT-MLT) 10 MG disintegrating tablet Dissolve 1 tablet (10 mg total) by mouth as needed for migraine. May repeat in 2 hours if needed 27 tablet 4  ? sertraline (ZOLOFT) 50 MG tablet Take 1 tablet (50 mg total) by mouth daily. 90 tablet 0  ? simethicone (MYLICON) 80 MG chewable tablet Chew 80 mg by mouth every 6 (six) hours as needed for flatulence.    ? tinidazole (TINDAMAX) 500 MG tablet Take 2 tablets (1,000 mg total) by mouth daily with breakfast for 5 days 10 tablet 0  ? tiZANidine (  ZANAFLEX) 4 MG tablet Take 1 tablet by mouth every 8  hours as needed. 10 tablet 0  ? triamcinolone ointment (KENALOG) 0.1 % APPLY TO AFFECTED AREA(S) 2 TIMES A DAY 454 g 2  ? valACYclovir (VALTREX) 500 MG tablet Take 2 tablets (1,000 mg total) by mouth at bedtime. 180 tablet 3  ? Vibegron (GEMTESA) 75 MG TABS Take 1 tablet by mouth daily. 30 tablet 11  ? Vitamin D, Ergocalciferol, (DRISDOL) 1.25 MG (50000 UNIT) CAPS capsule TAKE 1 CAPSULE BY MOUTH ONCE A WEEK. 12 capsule 3  ? ?Current Facility-Administered Medications  ?Medication Dose Route Frequency Provider Last Rate Last Admin  ? tezepelumab-ekko (TEZSPIRE) 210 MG/1.91ML syringe 210 mg  210 mg Subcutaneous Q28 days Valentina Shaggy, MD   210 mg at 02/20/22 1735  ?  ?  ?Objective:   ?  ?Vitals:  ? 03/06/22 1537  ?BP: 116/80  ?Pulse: 72  ?SpO2: 97%  ?Weight: 162 lb (73.5 kg)  ?Height: '5\' 6"'$  (1.676 m)  ?  ?  ?Body mass index is 26.15 kg/m?.  ?  ?Physical Exam:   ?  ?General: Well-appearing, cooperative, sitting comfortably in no acute distress.  ? ?OMT Physical Exam: ? ?ASIS Compression Test: Positive Right ?Cervical: TTP paraspinal, C3-5 RL SL ?Rib: Bilateral elevated first rib with TTP ?Thoracic: TTP paraspinal, T5-7 RRSL ?Lumbar: nTTP  paraspinal, L5 rotated right ?Pelvis: Right anterior innominate ? ?TTP left trapezius ? ?Electronically signed by:  ?Donna Park ?[facility] ?4:10 PM 03/06/22 ?

## 2022-03-06 ENCOUNTER — Other Ambulatory Visit (HOSPITAL_COMMUNITY): Payer: Self-pay

## 2022-03-06 ENCOUNTER — Ambulatory Visit (INDEPENDENT_AMBULATORY_CARE_PROVIDER_SITE_OTHER): Payer: 59 | Admitting: Sports Medicine

## 2022-03-06 VITALS — BP 116/80 | HR 72 | Ht 66.0 in | Wt 162.0 lb

## 2022-03-06 DIAGNOSIS — M542 Cervicalgia: Secondary | ICD-10-CM | POA: Diagnosis not present

## 2022-03-06 DIAGNOSIS — M9903 Segmental and somatic dysfunction of lumbar region: Secondary | ICD-10-CM | POA: Diagnosis not present

## 2022-03-06 DIAGNOSIS — M9901 Segmental and somatic dysfunction of cervical region: Secondary | ICD-10-CM

## 2022-03-06 DIAGNOSIS — M9905 Segmental and somatic dysfunction of pelvic region: Secondary | ICD-10-CM | POA: Diagnosis not present

## 2022-03-06 DIAGNOSIS — M9902 Segmental and somatic dysfunction of thoracic region: Secondary | ICD-10-CM | POA: Diagnosis not present

## 2022-03-06 DIAGNOSIS — M9908 Segmental and somatic dysfunction of rib cage: Secondary | ICD-10-CM | POA: Diagnosis not present

## 2022-03-06 MED ORDER — MELOXICAM 15 MG PO TABS
15.0000 mg | ORAL_TABLET | Freq: Every day | ORAL | 0 refills | Status: DC
  Filled 2022-03-06 – 2022-03-28 (×2): qty 30, 30d supply, fill #0

## 2022-03-06 NOTE — Patient Instructions (Addendum)
Good to see you  ?Neck and trap HEP  ?Meloxicam refill use as needed  ?2 week follow up  ? ?

## 2022-03-10 NOTE — Progress Notes (Signed)
Agree with the assessment and plan as outlined by Jessica Zehr, PA-C. ? ?Floyd Lusignan, DO, FACG ? ?

## 2022-03-11 ENCOUNTER — Other Ambulatory Visit (HOSPITAL_COMMUNITY): Payer: Self-pay

## 2022-03-12 ENCOUNTER — Encounter: Payer: Self-pay | Admitting: Gastroenterology

## 2022-03-12 ENCOUNTER — Ambulatory Visit: Payer: 59

## 2022-03-13 ENCOUNTER — Other Ambulatory Visit (HOSPITAL_COMMUNITY): Payer: Self-pay

## 2022-03-14 ENCOUNTER — Other Ambulatory Visit (HOSPITAL_COMMUNITY): Payer: Self-pay
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
## 2022-03-17 ENCOUNTER — Other Ambulatory Visit (HOSPITAL_COMMUNITY): Payer: Self-pay

## 2022-03-20 ENCOUNTER — Ambulatory Visit: Payer: 59

## 2022-03-20 NOTE — Telephone Encounter (Signed)
Received PA from [place], [place] # [phone] (04/07/2022-04/07/2023). ?

## 2022-03-21 ENCOUNTER — Ambulatory Visit (AMBULATORY_SURGERY_CENTER): Payer: 59 | Admitting: Gastroenterology

## 2022-03-21 ENCOUNTER — Ambulatory Visit: Payer: 59 | Admitting: Sports Medicine

## 2022-03-21 ENCOUNTER — Encounter: Payer: Self-pay | Admitting: Gastroenterology

## 2022-03-21 VITALS — BP 117/72 | HR 77 | Temp 98.6°F | Resp 14 | Ht 66.0 in | Wt 169.0 lb

## 2022-03-21 DIAGNOSIS — R1319 Other dysphagia: Secondary | ICD-10-CM | POA: Diagnosis not present

## 2022-03-21 DIAGNOSIS — D123 Benign neoplasm of transverse colon: Secondary | ICD-10-CM | POA: Diagnosis not present

## 2022-03-21 DIAGNOSIS — R634 Abnormal weight loss: Secondary | ICD-10-CM | POA: Diagnosis not present

## 2022-03-21 DIAGNOSIS — K640 First degree hemorrhoids: Secondary | ICD-10-CM

## 2022-03-21 DIAGNOSIS — D122 Benign neoplasm of ascending colon: Secondary | ICD-10-CM

## 2022-03-21 DIAGNOSIS — R140 Abdominal distension (gaseous): Secondary | ICD-10-CM

## 2022-03-21 DIAGNOSIS — R1012 Left upper quadrant pain: Secondary | ICD-10-CM | POA: Diagnosis not present

## 2022-03-21 DIAGNOSIS — Z9884 Bariatric surgery status: Secondary | ICD-10-CM

## 2022-03-21 DIAGNOSIS — R197 Diarrhea, unspecified: Secondary | ICD-10-CM | POA: Diagnosis not present

## 2022-03-21 DIAGNOSIS — R194 Change in bowel habit: Secondary | ICD-10-CM | POA: Diagnosis not present

## 2022-03-21 MED ORDER — SODIUM CHLORIDE 0.9 % IV SOLN: 500.0000 mL | Freq: Once | INTRAVENOUS | Status: DC

## 2022-03-21 NOTE — Op Note (Signed)
[place] ?Patient Name: Donna Park ?Procedure Date: 03/21/2022 1:59 PM ?MRN: [id number] ?Endoscopist: Gerrit Heck , MD ?Age: 51 ?Referring MD:  ?Date of Birth: 10/21/1971 ?Gender: Female ?Account #: [id number] ?Procedure:                Upper GI endoscopy ?Indications:              Abdominal pain in the left upper quadrant,  ?                          Dysphagia, Abdominal bloating, Diarrhea, Weight loss ?Medicines:                Monitored Anesthesia Care ?Procedure:                Pre-Anesthesia Assessment: ?                          - Prior to the procedure, a History and Physical  ?                          was performed, and patient medications and  ?                          allergies were reviewed. The patient's tolerance of  ?                          previous anesthesia was also reviewed. The risks  ?                          and benefits of the procedure and the sedation  ?                          options and risks were discussed with the patient.  ?                          All questions were answered, and informed consent  ?                          was obtained. Prior Anticoagulants: The patient has  ?                          taken no previous anticoagulant or antiplatelet  ?                          agents. ASA Grade Assessment: III - A patient with  ?                          severe systemic disease. After reviewing the risks  ?                          and benefits, the patient was deemed in  ?                          satisfactory condition to undergo the procedure. ?  After obtaining informed consent, the endoscope was  ?                          passed under direct vision. Throughout the  ?                          procedure, the patient's blood pressure, pulse, and  ?                          oxygen saturations were monitored continuously. The  ?                          Endoscope was introduced through the mouth, and  ?                          advanced  to the jejunum. The upper GI endoscopy was  ?                          accomplished without difficulty. The patient  ?                          tolerated the procedure well. ?Scope In: ?Scope Out: ?Findings:                 The examined esophagus was normal. Due to the  ?                          symptoms or dysphagia, the decision was made to  ?                          proceed with empiric esophageal dilation. A  ?                          guidewire was placed and the scope was withdrawn.  ?                          Dilation was performed with a Savary dilator with  ?                          no resistance at 17 mm. The dilation site was  ?                          examined following endoscope reinsertion and showed  ?                          no bleeding, mucosal tear or perforation. Estimated  ?                          blood loss: none. ?                          The Z-line was regular and was found 40 cm from the  ?  incisors. ?                          Evidence of a Roux-en-Y gastrojejunostomy was  ?                          found. The gastrojejunal anastomosis was  ?                          characterized by healthy appearing mucosa. This was  ?                          easily traversed. The pouch-to-jejunum limb was  ?                          characterized by healthy appearing mucosa. The  ?                          gastric pouch mucosa was normal appearing. Biopsies  ?                          were taken with a cold forceps for Helicobacter  ?                          pylori testing. Estimated blood loss was minimal. ?                          The examined small bowel was normal. This was  ?                          biopsied with a cold forceps for histology.  ?                          Estimated blood loss was minimal. ?Complications:            No immediate complications. ?Estimated Blood Loss:     Estimated blood loss was minimal. ?Impression:               - Normal esophagus.  Dilated. ?                          - Z-line regular, 40 cm from the incisors. ?                          - Roux-en-Y gastrojejunostomy with gastrojejunal  ?                          anastomosis characterized by healthy appearing  ?                          mucosa. ?                          - Normal gastric mucosa. Biopsied. ?                          - Normal examined  jejunum. Biopsied. ?Recommendation:           - Patient has a contact number available for  ?                          emergencies. The signs and symptoms of potential  ?                          delayed complications were discussed with the  ?                          patient. Return to normal activities tomorrow.  ?                          Written discharge instructions were provided to the  ?                          patient. ?                          - Resume previous diet. ?                          - Continue present medications. ?                          - Await pathology results. ?                          - Colonoscopy today. ?Gerrit Heck, MD ?03/21/2022 2:36:13 PM ?

## 2022-03-21 NOTE — Op Note (Signed)
[place] [facility] ?Patient Name: Donna Park ?Procedure Date: 03/21/2022 1:58 PM ?MRN: [id number] ?Endoscopist: Gerrit Heck , MD ?Age: 51 ?Referring MD:  ?Date of Birth: Mar 16, 1971 ?Gender: Female ?Account #: [id number] ?Procedure:                Colonoscopy ?Indications:              Abdominal pain in the left upper quadrant, Family  ?                          history of Crohn's disease in a first-degree  ?                          relative (sister), Change in bowel habits,  ?                          Diarrhea, Weight loss ?Medicines:                Monitored Anesthesia Care ?Procedure:                Pre-Anesthesia Assessment: ?                          - Prior to the procedure, a History and Physical  ?                          was performed, and patient medications and  ?                          allergies were reviewed. The patient's tolerance of  ?                          previous anesthesia was also reviewed. The risks  ?                          and benefits of the procedure and the sedation  ?                          options and risks were discussed with the patient.  ?                          All questions were answered, and informed consent  ?                          was obtained. Prior Anticoagulants: The patient has  ?                          taken no previous anticoagulant or antiplatelet  ?                          agents. ASA Grade Assessment: III - A patient with  ?                          severe systemic disease. After reviewing the risks  ?  and benefits, the patient was deemed in  ?                          satisfactory condition to undergo the procedure. ?                          After obtaining informed consent, the colonoscope  ?                          was passed under direct vision. Throughout the  ?                          procedure, the patient's blood pressure, pulse, and  ?                          oxygen saturations were monitored continuously.  The  ?                          Olympus CF-[id number] (#[id number]) Colonoscope was  ?                          introduced through the anus and advanced to the the  ?                          terminal ileum. The colonoscopy was performed  ?                          without difficulty. The patient tolerated the  ?                          procedure well. The quality of the bowel  ?                          preparation was good. The terminal ileum, ileocecal  ?                          valve, appendiceal orifice, and rectum were  ?                          photographed. ?Scope In: 2:13:56 PM ?Scope Out: 2:28:15 PM ?Scope Withdrawal Time: 0 hours 12 minutes 20 seconds  ?Total Procedure Duration: 0 hours 14 minutes 19 seconds  ?Findings:                 The perianal and digital rectal examinations were  ?                          normal. ?                          Two sessile polyps were found in the transverse  ?                          colon and ascending colon. The polyps were 3 to 5  ?  mm in size. These polyps were removed with a cold  ?                          snare. Resection and retrieval were complete.  ?                          Estimated blood loss was minimal. ?                          The mucosa was otherwise normal appearing  ?                          throughout the entire colon. Biopsies for histology  ?                          were taken with a cold forceps from the right colon  ?                          and left colon for evaluation of microscopic  ?                          colitis. Estimated blood loss was minimal. ?                          Non-bleeding internal hemorrhoids were found during  ?                          retroflexion. The hemorrhoids were small and Grade  ?                          I (internal hemorrhoids that do not prolapse). ?                          The terminal ileum appeared normal. ?Complications:            No immediate complications. ?Estimated Blood  Loss:     Estimated blood loss was minimal. ?Impression:               - Two 3 to 5 mm polyps in the transverse colon and  ?                          in the ascending colon, removed with a cold snare.  ?                          Resected and retrieved. ?                          - Normal mucosa in the entire examined colon.  ?                          Biopsied. ?                          - Non-bleeding internal hemorrhoids. ?                          -  The examined portion of the ileum was normal. ?Recommendation:           - Patient has a contact number available for  ?                          emergencies. The signs and symptoms of potential  ?                          delayed complications were discussed with the  ?                          patient. Return to normal activities tomorrow.  ?                          Written discharge instructions were provided to the  ?                          patient. ?                          - Resume previous diet. ?                          - Continue present medications. ?                          - Await pathology results. ?                          - Repeat colonoscopy for surveillance based on  ?                          pathology results. ?                          - Return to GI office PRN. ?Gerrit Heck, MD ?03/21/2022 2:39:46 PM ?

## 2022-03-21 NOTE — Progress Notes (Signed)
To Pacu, VSS. Report to [email or web address] 

## 2022-03-21 NOTE — Progress Notes (Signed)
Called to room to assist during endoscopic procedure.  Patient ID and intended procedure confirmed with present staff. Received instructions for my participation in the procedure from the performing physician.  

## 2022-03-21 NOTE — Patient Instructions (Signed)
?Handouts given on polyps and hemorrhoids.  ? ?YOU HAD AN ENDOSCOPIC PROCEDURE TODAY AT [facility]:   Refer to the procedure report that was given to you for any specific questions about what was found during the examination.  If the procedure report does not answer your questions, please call your gastroenterologist to clarify.  If you requested that your care partner not be given the details of your procedure findings, then the procedure report has been included in a sealed envelope for you to review at your convenience later. ? ?YOU SHOULD EXPECT: Some feelings of bloating in the abdomen. Passage of more gas than usual.  Walking can help get rid of the air that was put into your GI tract during the procedure and reduce the bloating. If you had a lower endoscopy (such as a colonoscopy or flexible sigmoidoscopy) you may notice spotting of blood in your stool or on the toilet paper. If you underwent a bowel prep for your procedure, you may not have a normal bowel movement for a few days. ? ?Please Note:  You might notice some irritation and congestion in your nose or some drainage.  This is from the oxygen used during your procedure.  There is no need for concern and it should clear up in a day or so. ? ?SYMPTOMS TO REPORT IMMEDIATELY: ? ?Following lower endoscopy (colonoscopy or flexible sigmoidoscopy): ? Excessive amounts of blood in the stool ? Significant tenderness or worsening of abdominal pains ? Swelling of the abdomen that is new, acute ? Fever of 100?F or higher ? ?Following upper endoscopy (EGD) ? Vomiting of blood or coffee ground material ? New chest pain or pain under the shoulder blades ? Painful or persistently difficult swallowing ? New shortness of breath ? Fever of 100?F or higher ? Black, tarry-looking stools ? ?For urgent or emergent issues, a gastroenterologist can be reached at any hour by calling [phone]. ?Do not use MyChart messaging for urgent concerns.  ? ? ?DIET:   We do recommend a small meal at first, but then you may proceed to your regular diet.  Drink plenty of fluids but you should avoid alcoholic beverages for 24 hours. ? ?ACTIVITY:  You should plan to take it easy for the rest of today and you should NOT DRIVE or use heavy machinery until tomorrow (because of the sedation medicines used during the test).   ? ?FOLLOW UP: ?Our staff will call the number listed on your records 48-72 hours following your procedure to check on you and address any questions or concerns that you may have regarding the information given to you following your procedure. If we do not reach you, we will leave a message.  We will attempt to reach you two times.  During this call, we will ask if you have developed any symptoms of COVID 19. If you develop any symptoms (ie: fever, flu-like symptoms, shortness of breath, cough etc.) before then, please call [phone].  If you test positive for Covid 19 in the 2 weeks post procedure, please call and report this information to [place].   ? ?If any biopsies were taken you will be contacted by phone or by letter within the next 1-3 weeks.  Please call us at [phone] if you have not heard about the biopsies in 3 weeks.  ? ? ?SIGNATURES/CONFIDENTIALITY: ?You and/or your care partner have signed paperwork which will be entered into your electronic medical record.  These signatures attest to the fact  that that the information above on your After Visit Summary has been reviewed and is understood.  Full responsibility of the confidentiality of this discharge information lies with you and/or your care-partner.  ?

## 2022-03-21 NOTE — Progress Notes (Signed)
VS completed by CW. ? ?Medical history reviewed and updated. ? ?

## 2022-03-21 NOTE — Progress Notes (Signed)
? ?GASTROENTEROLOGY PROCEDURE H&P NOTE  ? ?Primary Care Physician: ?Dorothyann Peng, NP ? ? ? ?Reason for Procedure:  Change in bowel habits, diarrhea, abdominal bloating, LUQ pain, weight loss, dysphagia; Colon cancer screening ? ?Plan:    EGD, colonoscopy ? ?Patient is appropriate for endoscopic procedure(s) in the ambulatory ([place]) setting. ? ?The nature of the procedure, as well as the risks, benefits, and alternatives were carefully and thoroughly reviewed with the patient. Ample time for discussion and questions allowed. The patient understood, was satisfied, and agreed to proceed.  ? ? ? ?HPI: ?Donna Park is a 51 y.o. female who presents for EGD and colonoscopy for evaluation of multiple GI symptoms to include change in bowel habits, diarrhea, dysphagia, LUQ pain, abdominal bloating, weight loss, along with colon cancer screening.  Family history of Crohn's Disease.  Personal history of Roux-en-Y gastric bypass 06/2015. ? ?Past Medical History:  ?Diagnosis Date  ? Allergy   ? Anxiety   ? Arthritis   ? Asthma   ? Chicken pox   ? Complication of anesthesia   ? slow to wake up from anesthesia  ? Depression   ? DJD (degenerative joint disease)   ? GERD (gastroesophageal reflux disease)   ? resolved after gastric surgery  ? H/O gastric bypass 2016  ? History of iron deficiency anemia   ? HSV infection   ? Migraines   ? Obese   ? Pneumonia   ? Childhood history  ? PONV (postoperative nausea and vomiting)   ? Status post dilation of esophageal narrowing   ? Varicose vein of leg   ? ? ?Past Surgical History:  ?Procedure Laterality Date  ? ANKLE FRACTURE SURGERY Left 03/08/2009  ? BREAST SURGERY Bilateral   ? Cyst Removal  ? GASTRIC BYPASS  06/18/2015  ? LAPAROSCOPIC VAGINAL HYSTERECTOMY WITH SALPINGECTOMY Bilateral 05/15/2020  ? Procedure: LAPAROSCOPIC ASSISTED VAGINAL HYSTERECTOMY WITH SALPINGECTOMY;  Surgeon: Louretta Shorten, MD;  Location: [facility];  Service: Gynecology;  Laterality: Bilateral;   need bed  ? LASER ABLATION    ? Varicose veins  ? TONSILLECTOMY AND ADENOIDECTOMY  2001  ? TUBAL LIGATION  12/19/1999  ? UPPER GASTROINTESTINAL ENDOSCOPY    ? UPPER GI ENDOSCOPY    ? Uterine ablation  12/2018  ? ? ?Prior to Admission medications   ?Medication Sig Start Date End Date Taking? Authorizing Provider  ?albuterol (VENTOLIN HFA) 108 (90 Base) MCG/ACT inhaler Inhale 1-2 puffs into the lungs every 6 (six) hours as needed for wheezing or shortness of breath. 11/07/21  Yes Valentina Shaggy, MD  ?cetirizine (ZYRTEC) 10 MG tablet Take 2 tablets (20 mg total) by mouth 2 (two) times daily. 11/07/21  Yes Valentina Shaggy, MD  ?diazepam (VALIUM) 10 MG tablet Take 1 tablet (10 mg total) by mouth at bedtime as needed for sleep. 08/27/21  Yes Nafziger, Tommi Rumps, NP  ?fluconazole (DIFLUCAN) 150 MG tablet Take 1 tablet (150 mg total) by mouth daily. 11/11/21  Yes Valentina Shaggy, MD  ?Fluticasone-Umeclidin-Vilant (TRELEGY ELLIPTA) 200-62.5-25 MCG/ACT AEPB Inhale 1 puff into the lungs daily. 11/07/21  Yes Valentina Shaggy, MD  ?Galcanezumab-gnlm [facility]) 120 MG/ML SOAJ Inject 120 mg into the skin every 30 (thirty) days. 06/14/21  Yes Melvenia Beam, MD  ?Hypertonic Nasal Wash (SINUS RINSE REFILL) PACK Use one packet dissolved in water as needed. ?Patient taking differently: Place 1 each into the nose in the morning and at bedtime. 02/10/19  Yes Valentina Shaggy, MD  ?ibuprofen (ADVIL)  800 MG tablet Take 1 tablet (800 mg total) by mouth 3 (three) times daily as needed (pain.). 10/17/21  Yes Nafziger, Tommi Rumps, NP  ?meloxicam (MOBIC) 15 MG tablet Take 1 tablet (15 mg total) by mouth daily. 03/06/22  Yes Glennon Mac, DO  ?pantoprazole (PROTONIX) 40 MG tablet TAKE 1 TABLET (40 MG TOTAL) BY MOUTH AT BEDTIME. 02/08/21 03/21/22 Yes Nafziger, Tommi Rumps, NP  ?Pediatric Multiple Vit-C-FA (MULTIVITAMIN ANIMAL SHAPES, WITH CA/FA,) with C & FA chewable tablet Chew 2 tablets by mouth daily.   Yes [provider]   ?rizatriptan (MAXALT-MLT) 10 MG disintegrating tablet Dissolve 1 tablet (10 mg total) by mouth as needed for migraine. May repeat in 2 hours if needed 08/23/21  Yes Melvenia Beam, MD  ?tinidazole (TINDAMAX) 500 MG tablet Take 2 tablets (1,000 mg total) by mouth daily with breakfast for 5 days 01/31/22  Yes Nafziger, Tommi Rumps, NP  ?valACYclovir (VALTREX) 500 MG tablet Take 2 tablets (1,000 mg total) by mouth at bedtime. 01/10/22  Yes Dorothyann Peng, NP  ?Vitamin D, Ergocalciferol, (DRISDOL) 1.25 MG (50000 UNIT) CAPS capsule TAKE 1 CAPSULE BY MOUTH ONCE A WEEK. 02/12/21 04/04/22 Yes Nafziger, Tommi Rumps, NP  ?acetaminophen (TYLENOL) 500 MG tablet Take by mouth.    [provider]  ?acyclovir ointment (ZOVIRAX) 5 % Apply onto the affected area every 3 hours. 02/04/22   Nafziger, Tommi Rumps, NP  ?amitriptyline (ELAVIL) 10 MG tablet Take 1 tablet (10 mg total) by mouth at bedtime. 09/26/21   Lomax, Amy, NP  ?azelastine (ASTELIN) 0.1 % nasal spray Place 2 sprays into both nostrils 2 (two) times daily. ?Patient not taking: Reported on 03/21/2022 10/17/21   Althea Charon, [place]  ?Azelastine-Fluticasone (DYMISTA) 137-50 MCG/ACT SUSP Place 2 sprays into both nostrils 2 (two) times daily as needed. 11/11/21   Valentina Shaggy, MD  ?CALCIUM PO Take 1 tablet by mouth 4 (four) times a week.    [provider]  ?Cyanocobalamin (NASCOBAL NA) Place 1 spray into the nose every Sunday.    [provider]  ?EPINEPHrine 0.3 mg/0.3 mL IJ SOAJ injection Inject 0.3 mg into the muscle as needed for anaphylaxis. 07/05/21   Althea Charon, [place]  ?fluticasone (FLONASE) 50 MCG/ACT nasal spray Place 2 sprays into both nostrils daily. 10/17/21   Althea Charon, [place]  ?fluticasone-salmeterol (ADVAIR HFA) 230-21 MCG/ACT inhaler INHALE 2 PUFFS INTO THE LUNGS 2 (TWO) TIMES DAILY. 11/16/20 12/01/21  Valentina Shaggy, MD  ?ipratropium (ATROVENT) 0.06 % nasal spray Place 2 sprays into both nostrils 4 (four) times daily. 11/07/21    Valentina Shaggy, MD  ?lactulose, encephalopathy, (CHRONULAC) 10 GM/15ML SOLN Take 30 mLs (20 g total) by mouth daily as needed. 01/31/22   Nafziger, Tommi Rumps, NP  ?meclizine (ANTIVERT) 25 MG tablet Take 25 mg by mouth 3 (three) times daily as needed (dizziness/vertigo).  11/26/18   [provider]  ?MUCUS RELIEF ER 600 MG 12 hr tablet Take 600-1,200 mg by mouth 2 (two) times daily as needed (congestion).  12/12/18   [provider]  ?ondansetron (ZOFRAN-ODT) 4 MG disintegrating tablet Take 1-2 tablets (4-8 mg total) by mouth every 8 (eight) hours as needed. May take with Rizatriptan 06/14/21   Melvenia Beam, MD  ?sertraline (ZOLOFT) 50 MG tablet Take 1 tablet (50 mg total) by mouth daily. ?Patient not taking: Reported on 03/21/2022 02/19/22   Dorothyann Peng, NP  ?simethicone (MYLICON) 80 MG chewable tablet Chew 80 mg by mouth every 6 (six) hours as needed for flatulence.  [provider]  ?tezepelumab-ekko (TEZSPIRE) 210 MG/1.91ML syringe Inject 1.91 mLs (210 mg total) into the skin every 28 (twenty-eight) days. 04/05/21   Tresa Garter, MD  ?tiZANidine (ZANAFLEX) 4 MG tablet Take 1 tablet by mouth every 8  hours as needed. 01/23/22   Nafziger, Tommi Rumps, NP  ?triamcinolone ointment (KENALOG) 0.1 % APPLY TO AFFECTED AREA(S) 2 TIMES A DAY 11/07/21 11/07/22  Valentina Shaggy, MD  ?Vibegron [facility]) 75 MG TABS Take 1 tablet by mouth daily. 03/26/21     ? ? ?Current Outpatient Medications  ?Medication Sig Dispense Refill  ? albuterol (VENTOLIN HFA) 108 (90 Base) MCG/ACT inhaler Inhale 1-2 puffs into the lungs every 6 (six) hours as needed for wheezing or shortness of breath. 54 g 0  ? cetirizine (ZYRTEC) 10 MG tablet Take 2 tablets (20 mg total) by mouth 2 (two) times daily. 360 tablet 1  ? diazepam (VALIUM) 10 MG tablet Take 1 tablet (10 mg total) by mouth at bedtime as needed for sleep. 30 tablet 2  ? fluconazole (DIFLUCAN) 150 MG tablet Take 1 tablet (150 mg total) by mouth daily. 14  tablet 2  ? Fluticasone-Umeclidin-Vilant (TRELEGY ELLIPTA) 200-62.5-25 MCG/ACT AEPB Inhale 1 puff into the lungs daily. 60 each 5  ? Galcanezumab-gnlm (EMGALITY) 120 MG/ML SOAJ Inject 120 mg into the skin every 30 (t

## 2022-03-24 ENCOUNTER — Encounter: Payer: Self-pay | Admitting: Adult Health

## 2022-03-25 ENCOUNTER — Ambulatory Visit (INDEPENDENT_AMBULATORY_CARE_PROVIDER_SITE_OTHER): Payer: 59

## 2022-03-25 ENCOUNTER — Telehealth: Payer: Self-pay | Admitting: *Deleted

## 2022-03-25 ENCOUNTER — Telehealth: Payer: Self-pay | Admitting: Allergy & Immunology

## 2022-03-25 DIAGNOSIS — J4550 Severe persistent asthma, uncomplicated: Secondary | ICD-10-CM | POA: Diagnosis not present

## 2022-03-25 MED ORDER — FLUCONAZOLE 150 MG PO TABS
150.0000 mg | ORAL_TABLET | Freq: Every day | ORAL | 2 refills | Status: DC
  Filled 2022-03-25: qty 14, 14d supply, fill #0

## 2022-03-25 MED ORDER — FLUCONAZOLE 150 MG PO TABS
150.0000 mg | ORAL_TABLET | Freq: Every day | ORAL | 2 refills | Status: DC
  Filled 2022-03-25: qty 14, 14d supply, fill #0
  Filled 2022-04-23: qty 14, 14d supply, fill #1
  Filled 2022-07-26: qty 14, 14d supply, fill #2

## 2022-03-25 NOTE — Telephone Encounter (Signed)
Refilled Diflucan.  ? ?Salvatore Marvel, MD ?[facility] ? ?

## 2022-03-25 NOTE — Telephone Encounter (Signed)
FYI

## 2022-03-25 NOTE — Telephone Encounter (Signed)
?  Follow up Call- ? ? ?  03/21/2022  ?  1:22 PM  ?Call back number  ?Post procedure Call Back phone  # [phone]  ?Permission to leave phone message Yes  ?  ? ?Patient questions: ? ?Do you have a fever, pain , or abdominal swelling? No. ?Pain Score  0 * ? ?Have you tolerated food without any problems? Yes.   ? ?Have you been able to return to your normal activities? Yes.   ? ?Do you have any questions about your discharge instructions: ?Diet   No. ?Medications  No. ?Follow up visit  No. ? ?Do you have questions or concerns about your Care? No. ? ?Actions: ?* If pain score is 4 or above: ?No action needed, pain <4. ? ? ?

## 2022-03-26 ENCOUNTER — Other Ambulatory Visit (HOSPITAL_COMMUNITY): Payer: Self-pay

## 2022-03-27 ENCOUNTER — Encounter: Payer: Self-pay | Admitting: Gastroenterology

## 2022-03-27 NOTE — Progress Notes (Signed)
? [name] D.Merril Abbe ?[facility] ?[place] ?Phone: [phone] ?  ?Assessment and Plan:   ?  ?1. Neck pain ?2. Somatic dysfunction of cervical region ?3. Somatic dysfunction of thoracic region ?4. Somatic dysfunction of lumbar region ?5. Somatic dysfunction of pelvic region ?6. Somatic dysfunction of rib region ?-Chronic with exacerbation, subsequent sports medicine visit ?- Recurrence of multiple musculoskeletal complaints with most prominent being in left-sided neck and trapezius ?- Continue meloxicam as needed for breakthrough pain and Tylenol for day-to-day pain ?- Patient has received significant relief with trigger point injections in the past, and elects for repeat injections today.  Tolerated well per note below. ? ?Trigger Point Injection: ?After informed consent was obtained, skin cleaned with alcohol  prep.  A total of 4 trigger points identified along left trapezius, left levator, left cervical paraspinal.  Injections given over area of pain for total injection of 3 ml lidocaine 1% w/o epi.  Patient had relief after the injection without side effects.  Pt given signs of infection to watch for.  ? ?- Patient has received significant relief with OMT in the past.  Elects for repeat OMT today.  Tolerated well per note below. ?- Decision today to treat with OMT was based on Physical Exam ?  ?After verbal consent patient was treated with HVLA (high velocity low amplitude), ME (muscle energy), FPR (flex positional release), ST (soft tissue), PC/PD (Pelvic Compression/ Pelvic Decompression) techniques in cervical, rib, thoracic, lumbar, and pelvic areas. Patient tolerated the procedure well with improvement in symptoms.  Patient educated on potential side effects of soreness and recommended to rest, hydrate, and use Tylenol as needed for pain control. ?  ?Pertinent previous records reviewed include none ?  ?Follow Up: 4 weeks for repeat OMT maintenance.  Could  consider repeat trigger point injections ?  ?Subjective:   ?I, [name], am serving as a Education administrator for [employer] ? ?Chief Complaint: neck and shoulder pain  ?  ?HPI:  ?01/31/2022 ?Patient is a 51 year old female complaining of neck and shoulder pain. Patient states been going on for about 2 weeks has knots in her shoulders both neck and shoulders have been stiff , interested in OMT , knots in her upper trap , low back and her sciatic nerve on her right side flares up when she's having intercourse goes down her leg and upper her back  ?  ?02/21/2022 ?Patient states that her neck and her shoulders are still bothering her after her adjustment she was very tight is still tight in the upper back , still wants the adjustment will get a massage later tonight as well  , hip still hurts , her tailbone she woke up one morning with pain   ?  ?03/06/2022 ?Patient states still has upper trap tightness, finder her self leaning on that left side  ?  ?03/28/2022 ?Patient states that she is still tight time for an adjustment would like to discuss a lower spot for CSI  ? ?  ?Relevant Historical Information: History of migraines being treated with Botox injections ? ?Additional pertinent review of systems negative. ? ?Current Outpatient Medications  ?Medication Sig Dispense Refill  ? acetaminophen (TYLENOL) 500 MG tablet Take by mouth.    ? acyclovir ointment (ZOVIRAX) 5 % Apply onto the affected area every 3 hours. 15 g 1  ? albuterol (VENTOLIN HFA) 108 (90 Base) MCG/ACT inhaler Inhale 1-2 puffs into the lungs every 6 (six) hours  as needed for wheezing or shortness of breath. 54 g 0  ? amitriptyline (ELAVIL) 10 MG tablet Take 1 tablet (10 mg total) by mouth at bedtime. 90 tablet 1  ? azelastine (ASTELIN) 0.1 % nasal spray Place 2 sprays into both nostrils 2 (two) times daily. (Patient not taking: Reported on 03/21/2022) 30 mL 5  ? Azelastine-Fluticasone (DYMISTA) 137-50 MCG/ACT SUSP Place 2 sprays into both nostrils 2  (two) times daily as needed. 23 g 5  ? CALCIUM PO Take 1 tablet by mouth 4 (four) times a week.    ? cetirizine (ZYRTEC) 10 MG tablet Take 2 tablets (20 mg total) by mouth 2 (two) times daily. 360 tablet 1  ? Cyanocobalamin (NASCOBAL NA) Place 1 spray into the nose every Sunday.    ? diazepam (VALIUM) 10 MG tablet Take 1 tablet (10 mg total) by mouth at bedtime as needed for sleep. 30 tablet 2  ? EPINEPHrine 0.3 mg/0.3 mL IJ SOAJ injection Inject 0.3 mg into the muscle as needed for anaphylaxis. 2 each 1  ? fluconazole (DIFLUCAN) 150 MG tablet Take 1 tablet (150 mg total) by mouth daily. 14 tablet 2  ? fluticasone (FLONASE) 50 MCG/ACT nasal spray Place 2 sprays into both nostrils daily. 16 g 5  ? fluticasone-salmeterol (ADVAIR HFA) 230-21 MCG/ACT inhaler INHALE 2 PUFFS INTO THE LUNGS 2 (TWO) TIMES DAILY. 36 g 1  ? Fluticasone-Umeclidin-Vilant (TRELEGY ELLIPTA) 200-62.5-25 MCG/ACT AEPB Inhale 1 puff into the lungs daily. 60 each 5  ? Galcanezumab-gnlm (EMGALITY) 120 MG/ML SOAJ Inject 120 mg into the skin every 30 (thirty) days. 1 mL 11  ? Hypertonic Nasal Wash (SINUS RINSE REFILL) PACK Use one packet dissolved in water as needed. (Patient taking differently: Place 1 each into the nose in the morning and at bedtime.) 100 each 5  ? ibuprofen (ADVIL) 800 MG tablet Take 1 tablet (800 mg total) by mouth 3 (three) times daily as needed (pain.). 270 tablet 0  ? ipratropium (ATROVENT) 0.06 % nasal spray Place 2 sprays into both nostrils 4 (four) times daily. 45 mL 3  ? lactulose, encephalopathy, (CHRONULAC) 10 GM/15ML SOLN Take 30 mLs (20 g total) by mouth daily as needed. 450 mL 0  ? meclizine (ANTIVERT) 25 MG tablet Take 25 mg by mouth 3 (three) times daily as needed (dizziness/vertigo).     ? meloxicam (MOBIC) 15 MG tablet Take 1 tablet (15 mg total) by mouth daily. 30 tablet 0  ? MUCUS RELIEF ER 600 MG 12 hr tablet Take 600-1,200 mg by mouth 2 (two) times daily as needed (congestion).     ? ondansetron (ZOFRAN-ODT) 4 MG  disintegrating tablet Take 1-2 tablets (4-8 mg total) by mouth every 8 (eight) hours as needed. May take with Rizatriptan 30 tablet 3  ? pantoprazole (PROTONIX) 40 MG tablet TAKE 1 TABLET (40 MG TOTAL) BY MOUTH AT BEDTIME. 90 tablet 3  ? Pediatric Multiple Vit-C-FA (MULTIVITAMIN ANIMAL SHAPES, WITH CA/FA,) with C & FA chewable tablet Chew 2 tablets by mouth daily.    ? rizatriptan (MAXALT-MLT) 10 MG disintegrating tablet Dissolve 1 tablet (10 mg total) by mouth as needed for migraine. May repeat in 2 hours if needed 27 tablet 4  ? sertraline (ZOLOFT) 50 MG tablet Take 1 tablet (50 mg total) by mouth daily. (Patient not taking: Reported on 03/21/2022) 90 tablet 0  ? simethicone (MYLICON) 80 MG chewable tablet Chew 80 mg by mouth every 6 (six) hours as needed for flatulence.    ? tezepelumab-ekko (  TEZSPIRE) 210 MG/1.91ML syringe Inject 1.91 mLs (210 mg total) into the skin every 28 (twenty-eight) days. 1.91 mL 11  ? tinidazole (TINDAMAX) 500 MG tablet Take 2 tablets (1,000 mg total) by mouth daily with breakfast for 5 days 10 tablet 0  ? tiZANidine (ZANAFLEX) 4 MG tablet Take 1 tablet by mouth every 8  hours as needed. 10 tablet 0  ? triamcinolone ointment (KENALOG) 0.1 % APPLY TO AFFECTED AREA(S) 2 TIMES A DAY 454 g 2  ? valACYclovir (VALTREX) 500 MG tablet Take 2 tablets (1,000 mg total) by mouth at bedtime. 180 tablet 3  ? Vibegron (GEMTESA) 75 MG TABS Take 1 tablet by mouth daily. 30 tablet 11  ? Vitamin D, Ergocalciferol, (DRISDOL) 1.25 MG (50000 UNIT) CAPS capsule TAKE 1 CAPSULE BY MOUTH ONCE A WEEK. 12 capsule 3  ? ?Current Facility-Administered Medications  ?Medication Dose Route Frequency Provider Last Rate Last Admin  ? tezepelumab-ekko (TEZSPIRE) 210 MG/1.91ML syringe 210 mg  210 mg Subcutaneous Q28 days Valentina Shaggy, MD   210 mg at 03/25/22 1754  ?  ?  ?Objective:   ?  ?Vitals:  ? 03/28/22 1041  ?BP: 118/78  ?Pulse: 84  ?SpO2: 98%  ?Weight: 166 lb (75.3 kg)  ?Height: '5\' 6"'$  (1.676 m)  ?  ?  ?Body  mass index is 26.79 kg/m?.  ?  ?Physical Exam:   ?  ?General: Well-appearing, cooperative, sitting comfortably in no acute distress.  ? ?OMT Physical Exam: ?  ?ASIS Compression Test: Positive Right ?Cervical

## 2022-03-28 ENCOUNTER — Ambulatory Visit (INDEPENDENT_AMBULATORY_CARE_PROVIDER_SITE_OTHER): Payer: 59 | Admitting: Sports Medicine

## 2022-03-28 ENCOUNTER — Other Ambulatory Visit: Payer: Self-pay | Admitting: Adult Health

## 2022-03-28 ENCOUNTER — Other Ambulatory Visit (HOSPITAL_COMMUNITY): Payer: Self-pay

## 2022-03-28 VITALS — BP 118/78 | HR 84 | Ht 66.0 in | Wt 166.0 lb

## 2022-03-28 DIAGNOSIS — M9902 Segmental and somatic dysfunction of thoracic region: Secondary | ICD-10-CM | POA: Diagnosis not present

## 2022-03-28 DIAGNOSIS — M9908 Segmental and somatic dysfunction of rib cage: Secondary | ICD-10-CM | POA: Diagnosis not present

## 2022-03-28 DIAGNOSIS — M542 Cervicalgia: Secondary | ICD-10-CM | POA: Diagnosis not present

## 2022-03-28 DIAGNOSIS — M9901 Segmental and somatic dysfunction of cervical region: Secondary | ICD-10-CM

## 2022-03-28 DIAGNOSIS — M9903 Segmental and somatic dysfunction of lumbar region: Secondary | ICD-10-CM

## 2022-03-28 DIAGNOSIS — M9905 Segmental and somatic dysfunction of pelvic region: Secondary | ICD-10-CM

## 2022-03-28 MED ORDER — DIAZEPAM 10 MG PO TABS
10.0000 mg | ORAL_TABLET | Freq: Every evening | ORAL | 2 refills | Status: DC | PRN
  Filled 2022-03-28: qty 30, 30d supply, fill #0
  Filled 2022-04-23: qty 30, 30d supply, fill #1
  Filled 2022-04-24: qty 30, 30d supply, fill #0
  Filled 2022-07-26: qty 30, 30d supply, fill #1

## 2022-03-28 NOTE — Patient Instructions (Addendum)
Good to see you  ?Ankle HEP  ?4 week OMT follow up  ?

## 2022-03-28 NOTE — Telephone Encounter (Signed)
Okay for refill?  ? ? ?LOV 04/04/2022 ? ? ?Last Refill     ?diazepam (VALIUM) 10 MG tablet 30 tablet 2 08/27/2021   ?Sig:   Take 1 tablet (10 mg total) by mouth at bedtime as needed for sleep.      ? ? ?

## 2022-03-31 ENCOUNTER — Other Ambulatory Visit (HOSPITAL_COMMUNITY): Payer: Self-pay

## 2022-03-31 MED ORDER — GEMTESA 75 MG PO TABS
1.0000 | ORAL_TABLET | Freq: Every day | ORAL | 0 refills | Status: DC
  Filled 2022-03-31: qty 90, 90d supply, fill #0

## 2022-04-01 ENCOUNTER — Other Ambulatory Visit (HOSPITAL_COMMUNITY): Payer: Self-pay

## 2022-04-02 ENCOUNTER — Ambulatory Visit: Payer: 59 | Attending: Adult Health | Admitting: Pharmacist

## 2022-04-02 ENCOUNTER — Other Ambulatory Visit (HOSPITAL_COMMUNITY): Payer: Self-pay

## 2022-04-02 DIAGNOSIS — Z79899 Other long term (current) drug therapy: Secondary | ICD-10-CM

## 2022-04-02 NOTE — Progress Notes (Signed)
? ?  S: ?Patient presents today for review of their specialty medication.  ? ?Patient is currently taking Tezspire for asthma. Patient is managed by Dr. Ernst Bowler for this.  ? ?Dosing: 210 mg subq q28days ? ?Adherence: confirms.  ? ?Efficacy: pt endorses good control. ? ?Monitoring:  ?S/Sx of hypersensitivity: none. Some itching at the injection site initially but nothing currently. ?PFTs: monitored by Dr. Lyda Perone office  ?S/sx of infection: none  ? ?Current adverse effects: none reported ? ? ?O: ?   ? ?Lab Results  ?Component Value Date  ? WBC 3.8 (L) 02/08/2021  ? HGB 12.7 02/08/2021  ? HCT 37.8 02/08/2021  ? MCV 91.9 02/08/2021  ? PLT 174.0 02/08/2021  ? ? ?  Chemistry   ?   ?Component Value Date/Time  ? NA 139 02/08/2021 0748  ? NA 140 06/29/2019 1055  ? K 3.9 02/08/2021 0748  ? CL 106 02/08/2021 0748  ? CO2 29 02/08/2021 0748  ? BUN 14 02/08/2021 0748  ? BUN 11 06/29/2019 1055  ? CREATININE 0.65 02/08/2021 0748  ?    ?Component Value Date/Time  ? CALCIUM 9.0 02/08/2021 0748  ? CALCIUM 8.7 02/08/2021 0748  ? ALKPHOS 46 02/08/2021 0748  ? AST 24 02/08/2021 0748  ? ALT 25 02/08/2021 0748  ? BILITOT 0.5 02/08/2021 0748  ? BILITOT 0.5 06/29/2019 1055  ?  ? ?A/P: ?1. Medication review: patient currently on Tezspire for asthma. She is tolerating this well with reported efficacy. I reviewed the medication with her. She has received confirmation of reimbursement aids from the drug manufacturer of Tezspire. I will pass this information along to the pharmacy. No recommendations for any changes at this time.  ? ?Benard Halsted, PharmD, BCACP, CPP ?Clinical Pharmacist ?[place] ?[phone] ? ? ? ? ? ?

## 2022-04-03 ENCOUNTER — Other Ambulatory Visit: Payer: Self-pay | Admitting: Adult Health

## 2022-04-03 ENCOUNTER — Other Ambulatory Visit (HOSPITAL_COMMUNITY): Payer: Self-pay

## 2022-04-04 ENCOUNTER — Ambulatory Visit (INDEPENDENT_AMBULATORY_CARE_PROVIDER_SITE_OTHER): Payer: 59 | Admitting: Adult Health

## 2022-04-04 ENCOUNTER — Other Ambulatory Visit (HOSPITAL_COMMUNITY)
Admission: RE | Admit: 2022-04-04 | Discharge: 2022-04-04 | Disposition: A | Discharge status: Home / Self Care | Payer: 59 | Source: Ambulatory Visit | Attending: Adult Health | Admitting: Adult Health

## 2022-04-04 ENCOUNTER — Encounter: Payer: Self-pay | Admitting: Adult Health

## 2022-04-04 ENCOUNTER — Other Ambulatory Visit (HOSPITAL_COMMUNITY): Payer: Self-pay

## 2022-04-04 VITALS — BP 110/72 | HR 75 | Temp 97.8°F | Ht 66.0 in | Wt 173.0 lb

## 2022-04-04 DIAGNOSIS — F5101 Primary insomnia: Secondary | ICD-10-CM | POA: Diagnosis not present

## 2022-04-04 DIAGNOSIS — J4550 Severe persistent asthma, uncomplicated: Secondary | ICD-10-CM | POA: Diagnosis not present

## 2022-04-04 DIAGNOSIS — Z9884 Bariatric surgery status: Secondary | ICD-10-CM | POA: Diagnosis not present

## 2022-04-04 DIAGNOSIS — Z0000 Encounter for general adult medical examination without abnormal findings: Secondary | ICD-10-CM

## 2022-04-04 DIAGNOSIS — G43919 Migraine, unspecified, intractable, without status migrainosus: Secondary | ICD-10-CM

## 2022-04-04 DIAGNOSIS — A6004 Herpesviral vulvovaginitis: Secondary | ICD-10-CM | POA: Diagnosis not present

## 2022-04-04 DIAGNOSIS — K5909 Other constipation: Secondary | ICD-10-CM | POA: Diagnosis not present

## 2022-04-04 DIAGNOSIS — L987 Excessive and redundant skin and subcutaneous tissue: Secondary | ICD-10-CM | POA: Diagnosis not present

## 2022-04-04 DIAGNOSIS — Z113 Encounter for screening for infections with a predominantly sexual mode of transmission: Secondary | ICD-10-CM

## 2022-04-04 DIAGNOSIS — K219 Gastro-esophageal reflux disease without esophagitis: Secondary | ICD-10-CM

## 2022-04-04 LAB — LIPID PANEL
Cholesterol: 157 mg/dL (ref 0–200)
HDL: 77.9 mg/dL (ref >39.00)
LDL Cholesterol: 71 mg/dL (ref 0–99)
NonHDL: 79.14
Total CHOL/HDL Ratio: 2
Triglycerides: 39 mg/dL (ref 0.0–149.0)
VLDL: 7.8 mg/dL (ref 0.0–40.0)

## 2022-04-04 LAB — COMPREHENSIVE METABOLIC PANEL
ALT: 30 U/L (ref 0–35)
AST: 29 U/L (ref 0–37)
Albumin: 4 g/dL (ref 3.5–5.2)
Alkaline Phosphatase: 41 U/L (ref 39–117)
BUN: 15 mg/dL (ref 6–23)
CO2: 30 mEq/L (ref 19–32)
Calcium: 8.8 mg/dL (ref 8.4–10.5)
Chloride: 105 mEq/L (ref 96–112)
Creatinine, Ser: 0.68 mg/dL (ref 0.40–1.20)
GFR: 102.43 mL/min (ref >60.00)
Glucose, Bld: 87 mg/dL (ref 70–99)
Potassium: 3.6 mEq/L (ref 3.5–5.1)
Sodium: 142 mEq/L (ref 135–145)
Total Bilirubin: 0.4 mg/dL (ref 0.2–1.2)
Total Protein: 6.6 g/dL (ref 6.0–8.3)

## 2022-04-04 LAB — CBC WITH DIFFERENTIAL/PLATELET
Basophils Absolute: 0 10*3/uL (ref 0.0–0.1)
Basophils Relative: 0.6 % (ref 0.0–3.0)
Eosinophils Absolute: 0 10*3/uL (ref 0.0–0.7)
Eosinophils Relative: 0.5 % (ref 0.0–5.0)
HCT: 37.3 % (ref 36.0–46.0)
Hemoglobin: 12.2 g/dL (ref 12.0–15.0)
Lymphocytes Relative: 31.5 % (ref 12.0–46.0)
Lymphs Abs: 1.3 10*3/uL (ref 0.7–4.0)
MCHC: 32.8 g/dL (ref 30.0–36.0)
MCV: 91.4 fl (ref 78.0–100.0)
Monocytes Absolute: 0.4 10*3/uL (ref 0.1–1.0)
Monocytes Relative: 9.9 % (ref 3.0–12.0)
Neutro Abs: 2.5 10*3/uL (ref 1.4–7.7)
Neutrophils Relative %: 57.5 % (ref 43.0–77.0)
Platelets: 151 10*3/uL (ref 150.0–400.0)
RBC: 4.08 Mil/uL (ref 3.87–5.11)
RDW: 14.9 % (ref 11.5–15.5)
WBC: 4.3 10*3/uL (ref 4.0–10.5)

## 2022-04-04 LAB — IBC + FERRITIN
Ferritin: 11.1 ng/mL (ref 10.0–291.0)
Iron: 50 ug/dL (ref 42–145)
Saturation Ratios: 12.7 % — ABNORMAL LOW (ref 20.0–50.0)
TIBC: 393.4 ug/dL (ref 250.0–450.0)
Transferrin: 281 mg/dL (ref 212.0–360.0)

## 2022-04-04 LAB — TSH: TSH: 2.24 u[IU]/mL (ref 0.35–5.50)

## 2022-04-04 LAB — VITAMIN D 25 HYDROXY (VIT D DEFICIENCY, FRACTURES): VITD: 32.56 ng/mL (ref 30.00–100.00)

## 2022-04-04 LAB — FOLATE: Folate: 6.2 ng/mL (ref >5.9)

## 2022-04-04 LAB — HEMOGLOBIN A1C: Hgb A1c MFr Bld: 5.6 % (ref 4.6–6.5)

## 2022-04-04 LAB — VITAMIN B12: Vitamin B-12: 206 pg/mL — ABNORMAL LOW (ref 211–911)

## 2022-04-04 MED ORDER — VALACYCLOVIR HCL 500 MG PO TABS
1000.0000 mg | ORAL_TABLET | Freq: Two times a day (BID) | ORAL | 0 refills | Status: DC
Start: 2022-04-04 — End: 2022-05-01
  Filled 2022-04-04: qty 120, 30d supply, fill #0

## 2022-04-04 MED ORDER — VALACYCLOVIR HCL 500 MG PO TABS
1000.0000 mg | ORAL_TABLET | Freq: Two times a day (BID) | ORAL | 1 refills | Status: DC
  Filled 2022-04-04: qty 120, 30d supply, fill #0
  Filled 2022-04-04: qty 360, 90d supply, fill #0

## 2022-04-04 MED ORDER — PANTOPRAZOLE SODIUM 40 MG PO TBEC
40.0000 mg | DELAYED_RELEASE_TABLET | Freq: Every day | ORAL | 3 refills | Status: DC
  Filled 2022-04-04: qty 90, 90d supply, fill #0
  Filled 2022-07-26: qty 90, 90d supply, fill #1
  Filled 2022-10-20: qty 90, 90d supply, fill #2

## 2022-04-04 NOTE — Addendum Note (Signed)
Addended by: Apolinar Junes on: 04/04/2022 11:55 AM ? ? Modules accepted: Orders ? ?

## 2022-04-04 NOTE — Progress Notes (Signed)
? ?Subjective:  ? ? Patient ID: Donna Park, female    DOB: [birth year], 51 y.o.   MRN: [id number] ? ?HPI ?Patient presents for yearly preventative medicine examination. She is a pleasant 51 year old female who  has a past medical history of Allergy, Anxiety, Arthritis, Asthma, Chicken pox, Complication of anesthesia, Depression, DJD (degenerative joint disease), GERD (gastroesophageal reflux disease), H/O gastric bypass (2016), History of iron deficiency anemia, HSV infection, Migraines, Obese, Pneumonia, PONV (postoperative nausea and vomiting), Status post dilation of esophageal narrowing, and Varicose vein of leg. ? ?GERD-uses Protonix 40 mg daily and feels well controlled on this medication. ? ?HSV-takes Valtrex 500 mg twice daily. Has had recent outbreaks due to stress in her life.  ? ?Migraine headaches-are infrequent.  Takes Elavil and uses Imitrex as needed. Managed by Neurology  ? ?Insomnia -currently managed with Valium 10 mg nightly as needed.  She does have a longstanding history of insomnia.  Has tried multiple medications in the past including being on melatonin. ? ?Asthma-currently managed by allergy and asthma.  Prescribed albuterol inhaler, Advair inhaler, and Xolair injection.  She feels as though her symptoms are well controlled ? ?History of Gastric Bypass -had Roux-en-Y surgery in 2016.  She denies nausea or vomiting.  Does have alternating bowel habits with mostly constipation since her area check surgery.  She did recently have a colonoscopy and had a few polyps removed.  She has tried MiraLAX and fiber without relief.  She has seen by GI for this issue in which time they did a colonoscopy.  No follow-up at this time.  She would like her labs checked ? ?STD screening -she would like to have STD screening done.  No concerns for STDs ? ?Chronic fungal infection in perineal and groin.  Believes that this is due to extra skin that she has weight loss from bariatric surgery.  Feels as though  the skin pulls down and inhibits drying as well as causes low back pain.  She would like a referral to plastic surgery to see if there is anything that can be done to help remove the excess skin since it is causing issues. ? ? ?All immunizations and health maintenance protocols were reviewed with the patient and needed orders were placed. ? ?Appropriate screening laboratory values were ordered for the patient including screening of hyperlipidemia, renal function and hepatic function. ? ?Medication reconciliation,  past medical history, social history, problem list and allergies were reviewed in detail with the patient ? ?Goals were established with regard to weight loss, exercise, and  diet in compliance with medications ? ?Review of Systems  ?Constitutional: Negative.   ?HENT: Negative.    ?Eyes: Negative.   ?Respiratory: Negative.    ?Cardiovascular: Negative.   ?Gastrointestinal: Negative.   ?Endocrine: Negative.   ?Genitourinary: Negative.   ?Musculoskeletal:  Positive for back pain.  ?Skin:  Positive for rash.  ?Allergic/Immunologic: Negative.   ?Neurological: Negative.   ?Hematological: Negative.   ?Psychiatric/Behavioral: Negative.    ? ?Past Medical History:  ?Diagnosis Date  ? Allergy   ? Anxiety   ? Arthritis   ? Asthma   ? Chicken pox   ? Complication of anesthesia   ? slow to wake up from anesthesia  ? Depression   ? DJD (degenerative joint disease)   ? GERD (gastroesophageal reflux disease)   ? resolved after gastric surgery  ? H/O gastric bypass 2016  ? History of iron deficiency anemia   ? HSV infection   ?  Migraines   ? Obese   ? Pneumonia   ? Childhood history  ? PONV (postoperative nausea and vomiting)   ? Status post dilation of esophageal narrowing   ? Varicose vein of leg   ? ? ?Social History  ? ?Socioeconomic History  ? Marital status: Married  ?  Spouse name: Not on file  ? Number of children: 4  ? Years of education: Not on file  ? Highest education level: Associate degree: academic program   ?Occupational History  ? Not on file  ?Tobacco Use  ? Smoking status: Never  ? Smokeless tobacco: Never  ?Vaping Use  ? Vaping Use: Never used  ?Substance and Sexual Activity  ? Alcohol use: Yes  ?  Comment: Social/Rare  ? Drug use: Never  ? Sexual activity: Yes  ?  Birth control/protection: Surgical  ?Other Topics Concern  ? Not on file  ?Social History Narrative  ? Not on file  ? ?Social Determinants of Health  ? ?Financial Resource Strain: Medium Risk  ? Difficulty of Paying Living Expenses: Somewhat hard  ?Food Insecurity: No Food Insecurity  ? Worried About Charity fundraiser in the Last Year: Never true  ? Ran Out of Food in the Last Year: Never true  ?Transportation Needs: No Transportation Needs  ? Lack of Transportation (Medical): No  ? Lack of Transportation (Non-Medical): No  ?Physical Activity: Sufficiently Active  ? Days of Exercise per Week: 5 days  ? Minutes of Exercise per Session: 150+ min  ?Stress: Stress Concern Present  ? Feeling of Stress : Very much  ?Social Connections: Moderately Integrated  ? Frequency of Communication with Friends and Family: More than three times a week  ? Frequency of Social Gatherings with Friends and Family: Never  ? Attends Religious Services: More than 4 times per year  ? Active Member of Clubs or Organizations: Yes  ? Attends Archivist Meetings: More than 4 times per year  ? Marital Status: Separated  ?Intimate Partner Violence: Not on file  ? ? ?Past Surgical History:  ?Procedure Laterality Date  ? ANKLE FRACTURE SURGERY Left 03/08/2009  ? BREAST SURGERY Bilateral   ? Cyst Removal  ? GASTRIC BYPASS  06/18/2015  ? LAPAROSCOPIC VAGINAL HYSTERECTOMY WITH SALPINGECTOMY Bilateral 05/15/2020  ? Procedure: LAPAROSCOPIC ASSISTED VAGINAL HYSTERECTOMY WITH SALPINGECTOMY;  Surgeon: Louretta Shorten, MD;  Location: [facility];  Service: Gynecology;  Laterality: Bilateral;  need bed  ? LASER ABLATION    ? Varicose veins  ? TONSILLECTOMY AND ADENOIDECTOMY   2001  ? TUBAL LIGATION  12/19/1999  ? UPPER GASTROINTESTINAL ENDOSCOPY    ? UPPER GI ENDOSCOPY    ? Uterine ablation  12/2018  ? ? ?Family History  ?Problem Relation Age of Onset  ? COPD Mother   ? Heart disease Mother   ? Alcohol abuse Mother   ? Depression Mother   ? Drug abuse Mother   ? High Cholesterol Mother   ? High blood pressure Mother   ? COPD Father   ? Aneurysm Father   ? Heart disease Father   ? Early death Father   ? Diabetes Father   ? Drug abuse Father   ? AAA (abdominal aortic aneurysm) Father   ?     cause of death at 11  ? Arthritis Sister   ? Kidney disease Sister   ? Crohn's disease Sister   ? Ulcerative colitis Sister   ? Thyroid disease Sister   ? Kidney  disease Brother   ? Kidney disease Maternal Aunt   ? Diabetes Paternal Aunt   ? Asthma Neg Hx   ? Allergic rhinitis Neg Hx   ? Colon cancer Neg Hx   ? Rectal cancer Neg Hx   ? Stomach cancer Neg Hx   ? ? ?Allergies  ?Allergen Reactions  ? Cyclobenzaprine Shortness Of Breath and Other (See Comments)  ?  Relaxed tongue ?  ? Shrimp Flavor Anaphylaxis  ? Sulfa Antibiotics Anaphylaxis, Hives and Swelling  ? Carisoprodol Hives  ? Metronidazole   ?  not allergic; drug doesn't work for pt  ? Morphine Other (See Comments)  ?  Feels like her head is going to "blow up" ? ?  ? Topiramate   ?  Brain fog  ? Adhesive [Tape] Rash  ?  Other reaction(s): SKIN - rash (skin burns) PAPER TAPE IS OK ?  ? Tramadol Itching, Nausea And Vomiting and Rash  ? ? ?Current Outpatient Medications on File Prior to Visit  ?Medication Sig Dispense Refill  ? acetaminophen (TYLENOL) 500 MG tablet Take by mouth.    ? acyclovir ointment (ZOVIRAX) 5 % Apply onto the affected area every 3 hours. 15 g 1  ? albuterol (VENTOLIN HFA) 108 (90 Base) MCG/ACT inhaler Inhale 1-2 puffs into the lungs every 6 (six) hours as needed for wheezing or shortness of breath. 54 g 0  ? amitriptyline (ELAVIL) 10 MG tablet Take 1 tablet (10 mg total) by mouth at bedtime. 90 tablet 1  ? azelastine  (ASTELIN) 0.1 % nasal spray Place 2 sprays into both nostrils 2 (two) times daily. (Patient not taking: Reported on 03/21/2022) 30 mL 5  ? Azelastine-Fluticasone (DYMISTA) 137-50 MCG/ACT SUSP Place 2 sprays int

## 2022-04-04 NOTE — Telephone Encounter (Signed)
Please advise 

## 2022-04-04 NOTE — Telephone Encounter (Signed)
Mickel Baas this pt saw Dr Bryan Lemma for procedure recently.   ?

## 2022-04-07 ENCOUNTER — Telehealth: Payer: Self-pay

## 2022-04-07 ENCOUNTER — Other Ambulatory Visit: Payer: Self-pay

## 2022-04-07 ENCOUNTER — Other Ambulatory Visit (HOSPITAL_COMMUNITY): Payer: Self-pay

## 2022-04-07 MED ORDER — LINACLOTIDE 145 MCG PO CAPS
145.0000 ug | ORAL_CAPSULE | Freq: Every day | ORAL | 3 refills | Status: DC
  Filled 2022-04-07: qty 60, 60d supply, fill #0
  Filled 2022-06-03: qty 60, 60d supply, fill #1
  Filled 2022-07-26: qty 60, 60d supply, fill #2
  Filled 2022-10-20: qty 60, 60d supply, fill #3

## 2022-04-07 NOTE — Telephone Encounter (Signed)
Plan to start with Linzess 145 mcg daily.  #60, RF 3.  If suboptimal response, can increase to 290 mcg/day ORN and MiraLAX 1 cap/day and titrate to soft stools without straining to have BM.  Please ensure she is drinking at least 64 ounces of water per day.  Can schedule follow-up appointment in the GI clinic to review efficacy and whether or not further evaluation (i.e. Sitz marker study, ARM, etc.) is needed. ?

## 2022-04-07 NOTE — Telephone Encounter (Signed)
Received PA request for Emgality. Completed via CMM. Sent to [employer]. Should have a determination within 3-5 business days. Key: [id number]. ?

## 2022-04-08 ENCOUNTER — Other Ambulatory Visit (HOSPITAL_COMMUNITY): Payer: Self-pay

## 2022-04-08 ENCOUNTER — Other Ambulatory Visit: Payer: Self-pay | Admitting: Allergy & Immunology

## 2022-04-08 LAB — URINE CYTOLOGY ANCILLARY ONLY
Chlamydia: NEGATIVE
Comment: NEGATIVE
Comment: NORMAL
Neisseria Gonorrhea: NEGATIVE

## 2022-04-08 MED ORDER — TEZSPIRE 210 MG/1.91ML ~~LOC~~ SOSY
210.0000 mg | PREFILLED_SYRINGE | SUBCUTANEOUS | 11 refills | Status: DC
  Filled 2022-04-08: qty 1.91, 28d supply, fill #0

## 2022-04-08 NOTE — Telephone Encounter (Signed)
Spoke with pt and pt is scheduled for a f/u on 6/2 at 11 am at [facility] office. Pt verbalized understanding and had no other concerns at end of call.  ?

## 2022-04-08 NOTE — Progress Notes (Signed)
Attempted to call pt. Unable to leave voicemail.  ?

## 2022-04-09 ENCOUNTER — Other Ambulatory Visit: Payer: Self-pay | Admitting: Pharmacist

## 2022-04-09 ENCOUNTER — Other Ambulatory Visit (HOSPITAL_COMMUNITY): Payer: Self-pay

## 2022-04-09 MED ORDER — TEZSPIRE 210 MG/1.91ML ~~LOC~~ SOSY
210.0000 mg | PREFILLED_SYRINGE | SUBCUTANEOUS | 11 refills | Status: DC
  Filled 2022-04-09: qty 1.91, 28d supply, fill #0
  Filled 2022-05-08: qty 1.91, 28d supply, fill #1
  Filled 2022-06-03: qty 1.91, 28d supply, fill #2
  Filled 2022-07-04: qty 1.91, 28d supply, fill #3
  Filled 2022-08-04: qty 1.91, 28d supply, fill #4
  Filled 2022-09-08: qty 1.91, 28d supply, fill #5
  Filled 2022-10-06: qty 1.91, 28d supply, fill #6
  Filled 2022-11-10 (×2): qty 1.91, 28d supply, fill #7
  Filled 2022-12-02: qty 1.91, 28d supply, fill #8
  Filled 2023-01-07 – 2023-01-08 (×4): qty 1.91, 28d supply, fill #9
  Filled 2023-02-06 – 2023-02-18 (×2): qty 1.91, 28d supply, fill #10
  Filled 2023-03-31: qty 1.91, 28d supply, fill #11

## 2022-04-10 ENCOUNTER — Ambulatory Visit: Payer: 59 | Admitting: Neurology

## 2022-04-10 ENCOUNTER — Other Ambulatory Visit (HOSPITAL_COMMUNITY): Payer: Self-pay

## 2022-04-10 LAB — RPR: RPR Ser Ql: NONREACTIVE

## 2022-04-10 LAB — EXTRA SPECIMEN

## 2022-04-10 LAB — VITAMIN A: Vitamin A (Retinoic Acid): 34 ug/dL — ABNORMAL LOW (ref 38–98)

## 2022-04-10 LAB — PTH, INTACT AND CALCIUM
Calcium: 8.8 mg/dL (ref 8.6–10.2)
PTH: 47 pg/mL (ref 16–77)

## 2022-04-10 LAB — HEPATITIS C ANTIBODY
Hepatitis C Ab: NONREACTIVE
SIGNAL TO CUT-OFF: 0.17 (ref <1.00)

## 2022-04-10 LAB — HIV ANTIBODY (ROUTINE TESTING W REFLEX): HIV 1&2 Ab, 4th Generation: NONREACTIVE

## 2022-04-10 NOTE — Telephone Encounter (Signed)
Received additional questions from [place]. Form completed and faxed back to [place]. Received a receipt of confirmation. ? ?Spoke with pt on the phone and she states the Emgality has reduced headache or migraine frequency by at least 2 days per month. Has also helped reduce migraine severity and duration.  ?

## 2022-04-14 ENCOUNTER — Other Ambulatory Visit (HOSPITAL_COMMUNITY): Payer: Self-pay

## 2022-04-18 ENCOUNTER — Other Ambulatory Visit (HOSPITAL_COMMUNITY): Payer: Self-pay

## 2022-04-18 ENCOUNTER — Encounter: Payer: Self-pay | Admitting: Adult Health

## 2022-04-18 ENCOUNTER — Ambulatory Visit (INDEPENDENT_AMBULATORY_CARE_PROVIDER_SITE_OTHER): Payer: 59 | Admitting: Neurology

## 2022-04-18 ENCOUNTER — Ambulatory Visit (INDEPENDENT_AMBULATORY_CARE_PROVIDER_SITE_OTHER): Payer: 59 | Admitting: Adult Health

## 2022-04-18 VITALS — BP 110/80 | HR 72 | Temp 97.9°F | Ht 66.0 in | Wt 172.0 lb

## 2022-04-18 DIAGNOSIS — G43709 Chronic migraine without aura, not intractable, without status migrainosus: Secondary | ICD-10-CM | POA: Diagnosis not present

## 2022-04-18 DIAGNOSIS — D2371 Other benign neoplasm of skin of right lower limb, including hip: Secondary | ICD-10-CM | POA: Diagnosis not present

## 2022-04-18 DIAGNOSIS — D492 Neoplasm of unspecified behavior of bone, soft tissue, and skin: Secondary | ICD-10-CM

## 2022-04-18 DIAGNOSIS — G43009 Migraine without aura, not intractable, without status migrainosus: Secondary | ICD-10-CM

## 2022-04-18 DIAGNOSIS — L918 Other hypertrophic disorders of the skin: Secondary | ICD-10-CM

## 2022-04-18 MED ORDER — NURTEC 75 MG PO TBDP
75.0000 mg | ORAL_TABLET | Freq: Every day | ORAL | 11 refills | Status: DC | PRN
  Filled 2022-04-18 – 2022-10-20 (×3): qty 16, 30d supply, fill #0
  Filled 2022-11-26: qty 16, 30d supply, fill #1

## 2022-04-18 NOTE — Progress Notes (Signed)
Botox- 200 units x 1 vial ?Lot: [id number] ?Expiration: 11/2024 ?[place]: [phone] ? ?Bacteriostatic 0.9% Sodium Chloride- 58m total ?Lot: [id number] ?Expiration: 07/09/2023 ?[place] [address]-77? ?Dx: GS14.239?B/B ? ?

## 2022-04-18 NOTE — Progress Notes (Addendum)
Consent Form ?Botulism Toxin Injection For Chronic Migraine ? ?04/18/2022; last seen by Dr Jaynee Eagles in 10/2021 and started on Botox therapy. She had second procedure 01/14/2022. She reports significant improvement in migraines. She feels migraines are 85-90% improved. Baseline: more than 25 headache days a month with greater than 10 being moderate to severe migraines. SHE LOST 170 POUNDS AFTER GASTRIC BYPASS 7 YEARS AGO. She works at [employer] with Dr. Leafy Ro.  ? ?She now only has 4 migraine days a month and <10 total headache days a month. Failed sumatriptan, rizatriptan. Prescrive nurtec. ? ?Meds ordered this encounter  ?Medications  ? Rimegepant Sulfate (NURTEC) 75 MG TBDP  ?  Sig: Take 75 mg by mouth daily as needed. For migraines. Take as close to onset of migraine as possible. One daily maximum.  ?  Dispense:  16 tablet  ?  Refill:  11  ?  Episodic migraines. She now only has 4 migraine days a month and <10 total headache days a month on botox and emgality. Failed sumatriptan, rizatriptan. Prescrive nurtec.  ? ? ?Reviewed orally with patient, additionally signature is on file: ? ?Botulism toxin has been approved by the Federal drug administration for treatment of chronic migraine. Botulism toxin does not cure chronic migraine and it may not be effective in some patients. ? ?The administration of botulism toxin is accomplished by injecting a small amount of toxin into the muscles of the neck and head. Dosage must be titrated for each individual. Any benefits resulting from botulism toxin tend to wear off after 3 months with a repeat injection required if benefit is to be maintained. Injections are usually done every 3-4 months with maximum effect peak achieved by about 2 or 3 weeks. Botulism toxin is expensive and you should be sure of what costs you will incur resulting from the injection. ? ?The side effects of botulism toxin use for chronic migraine may include: ? ? -Transient, and usually mild, facial weakness with  facial injections ? -Transient, and usually mild, head or neck weakness with head/neck injections ? -Reduction or loss of forehead facial animation due to forehead muscle weakness ? -Eyelid drooping ? -Dry eye ? -Pain at the site of injection or bruising at the site of injection ? -Double vision ? -Potential unknown long term risks ? ?Contraindications: You should not have Botox if you are pregnant, nursing, allergic to albumin, have an infection, skin condition, or muscle weakness at the site of the injection, or have myasthenia gravis, Lambert-Eaton syndrome, or ALS. ? ?It is also possible that as with any injection, there may be an allergic reaction or no effect from the medication. Reduced effectiveness after repeated injections is sometimes seen and rarely infection at the injection site may occur. All care will be taken to prevent these side effects. If therapy is given over a long time, atrophy and wasting in the muscle injected may occur. Occasionally the patient's become refractory to treatment because they develop antibodies to the toxin. In this event, therapy needs to be modified. ? ?I have read the above information and consent to the administration of botulism toxin. ? ? ? ?BOTOX PROCEDURE NOTE FOR MIGRAINE HEADACHE ? ? ? ?Contraindications and precautions discussed with patient(above). Aseptic procedure was observed and patient tolerated procedure. Procedure performed by Dr. Georgia Dom ? ?The condition has existed for more than 6 months, and pt does not have a diagnosis of ALS, Myasthenia Gravis or Lambert-Eaton Syndrome.  Risks and benefits of injections discussed and pt agrees  to proceed with the procedure.  Written consent obtained ? ?These injections are medically necessary. Pt  receives good benefits from these injections. These injections do not cause sedations or hallucinations which the oral therapies may cause. ? ?Description of procedure: ? ?The patient was placed in a sitting position. The  standard protocol was used for Botox as follows, with 5 units of Botox injected at each site: ? ? ?-Procerus muscle, midline injection ? ?-Corrugator muscle, bilateral injection ? ?-Frontalis muscle, bilateral injection, with 2 sites each side, medial injection was performed in the upper one third of the frontalis muscle, in the region vertical from the medial inferior edge of the superior orbital rim. The lateral injection was again in the upper one third of the forehead vertically above the lateral limbus of the cornea, 1.5 cm lateral to the medial injection site. ? ?-Temporalis muscle injection, 4 sites, bilaterally. The first injection was 3 cm above the tragus of the ear, second injection site was 1.5 cm to 3 cm up from the first injection site in line with the tragus of the ear. The third injection site was 1.5-3 cm forward between the first 2 injection sites. The fourth injection site was 1.5 cm posterior to the second injection site.  ? ?-Occipitalis muscle injection, 3 sites, bilaterally. The first injection was done one half way between the occipital protuberance and the tip of the mastoid process behind the ear. The second injection site was done lateral and superior to the first, 1 fingerbreadth from the first injection. The third injection site was 1 fingerbreadth superiorly and medially from the first injection site. ? ?-Cervical paraspinal muscle injection, 2 sites, bilateral knee first injection site was 1 cm from the midline of the cervical spine, 3 cm inferior to the lower border of the occipital protuberance. The second injection site was 1.5 cm superiorly and laterally to the first injection site. ? ?-Trapezius muscle injection was performed at 3 sites, bilaterally. The first injection site was in the upper trapezius muscle halfway between the inflection point of the neck, and the acromion. The second injection site was one half way between the acromion and the first injection site. The third  injection was done between the first injection site and the inflection point of the neck. ? ? ?Will return for repeat injection in 3 months. ? ? ?200 units of Botox was used, 45 U Botox not injected was wasted. The patient tolerated the procedure well, there were no complications of the above procedure. ? ? ?

## 2022-04-18 NOTE — Progress Notes (Signed)
? ?Subjective:  ? ? Patient ID: Donna Park, female    DOB: 10/22/1971, 51 y.o.   MRN: [id number] ? ?HPI ?51 year old female who  has a past medical history of Allergy, Anxiety, Arthritis, Asthma, Chicken pox, Complication of anesthesia, Depression, DJD (degenerative joint disease), GERD (gastroesophageal reflux disease), H/O gastric bypass (2016), History of iron deficiency anemia, HSV infection, Migraines, Obese, Pneumonia, PONV (postoperative nausea and vomiting), Status post dilation of esophageal narrowing, and Varicose vein of leg. ? ?She presents to the office today for concern of a " mole" on her right foot and a skin tag on her neck.  ? ?In regards to the mole, she would like to have it removed as it can be painful at times and possible becoming larger.  ? ?The skin tag on her neck becomes irritated and painful with clothing and when wearing jewelry.  ? ? ?Review of Systems ?See HPI  ? ?Past Medical History:  ?Diagnosis Date  ? Allergy   ? Anxiety   ? Arthritis   ? Asthma   ? Chicken pox   ? Complication of anesthesia   ? slow to wake up from anesthesia  ? Depression   ? DJD (degenerative joint disease)   ? GERD (gastroesophageal reflux disease)   ? resolved after gastric surgery  ? H/O gastric bypass 2016  ? History of iron deficiency anemia   ? HSV infection   ? Migraines   ? Obese   ? Pneumonia   ? Childhood history  ? PONV (postoperative nausea and vomiting)   ? Status post dilation of esophageal narrowing   ? Varicose vein of leg   ? ? ?Social History  ? ?Socioeconomic History  ? Marital status: Married  ?  Spouse name: Not on file  ? Number of children: 4  ? Years of education: Not on file  ? Highest education level: Associate degree: academic program  ?Occupational History  ? Not on file  ?Tobacco Use  ? Smoking status: Never  ? Smokeless tobacco: Never  ?Vaping Use  ? Vaping Use: Never used  ?Substance and Sexual Activity  ? Alcohol use: Yes  ?  Comment: Social/Rare  ? Drug use: Never  ? Sexual  activity: Yes  ?  Birth control/protection: Surgical  ?Other Topics Concern  ? Not on file  ?Social History Narrative  ? Not on file  ? ?Social Determinants of Health  ? ?Financial Resource Strain: Medium Risk  ? Difficulty of Paying Living Expenses: Somewhat hard  ?Food Insecurity: No Food Insecurity  ? Worried About Charity fundraiser in the Last Year: Never true  ? Ran Out of Food in the Last Year: Never true  ?Transportation Needs: No Transportation Needs  ? Lack of Transportation (Medical): No  ? Lack of Transportation (Non-Medical): No  ?Physical Activity: Sufficiently Active  ? Days of Exercise per Week: 5 days  ? Minutes of Exercise per Session: 150+ min  ?Stress: Stress Concern Present  ? Feeling of Stress : Very much  ?Social Connections: Moderately Integrated  ? Frequency of Communication with Friends and Family: More than three times a week  ? Frequency of Social Gatherings with Friends and Family: Never  ? Attends Religious Services: More than 4 times per year  ? Active Member of Clubs or Organizations: Yes  ? Attends Archivist Meetings: More than 4 times per year  ? Marital Status: Separated  ?Intimate Partner Violence: Not on file  ? ? ?Past  Surgical History:  ?Procedure Laterality Date  ? ANKLE FRACTURE SURGERY Left 03/08/2009  ? BREAST SURGERY Bilateral   ? Cyst Removal  ? GASTRIC BYPASS  06/18/2015  ? LAPAROSCOPIC VAGINAL HYSTERECTOMY WITH SALPINGECTOMY Bilateral 05/15/2020  ? Procedure: LAPAROSCOPIC ASSISTED VAGINAL HYSTERECTOMY WITH SALPINGECTOMY;  Surgeon: Louretta Shorten, MD;  Location: [facility];  Service: Gynecology;  Laterality: Bilateral;  need bed  ? LASER ABLATION    ? Varicose veins  ? TONSILLECTOMY AND ADENOIDECTOMY  2001  ? TUBAL LIGATION  12/19/1999  ? UPPER GASTROINTESTINAL ENDOSCOPY    ? UPPER GI ENDOSCOPY    ? Uterine ablation  12/2018  ? ? ?Family History  ?Problem Relation Age of Onset  ? COPD Mother   ? Heart disease Mother   ? Alcohol abuse Mother   ?  Depression Mother   ? Drug abuse Mother   ? High Cholesterol Mother   ? High blood pressure Mother   ? COPD Father   ? Aneurysm Father   ? Heart disease Father   ? Early death Father   ? Diabetes Father   ? Drug abuse Father   ? AAA (abdominal aortic aneurysm) Father   ?     cause of death at 45  ? Arthritis Sister   ? Kidney disease Sister   ? Crohn's disease Sister   ? Ulcerative colitis Sister   ? Thyroid disease Sister   ? Kidney disease Brother   ? Kidney disease Maternal Aunt   ? Diabetes Paternal Aunt   ? Asthma Neg Hx   ? Allergic rhinitis Neg Hx   ? Colon cancer Neg Hx   ? Rectal cancer Neg Hx   ? Stomach cancer Neg Hx   ? ? ?Allergies  ?Allergen Reactions  ? Cyclobenzaprine Shortness Of Breath and Other (See Comments)  ?  Relaxed tongue ?  ? Shrimp Flavor Anaphylaxis  ? Sulfa Antibiotics Anaphylaxis, Hives and Swelling  ? Carisoprodol Hives  ? Metronidazole   ?  not allergic; drug doesn't work for pt  ? Morphine Other (See Comments)  ?  Feels like her head is going to "blow up" ? ?  ? Topiramate   ?  Brain fog  ? Adhesive [Tape] Rash  ?  Other reaction(s): SKIN - rash (skin burns) PAPER TAPE IS OK ?  ? Tramadol Itching, Nausea And Vomiting and Rash  ? ? ?Current Outpatient Medications on File Prior to Visit  ?Medication Sig Dispense Refill  ? acetaminophen (TYLENOL) 500 MG tablet Take by mouth.    ? acyclovir ointment (ZOVIRAX) 5 % Apply onto the affected area every 3 hours. 15 g 1  ? albuterol (VENTOLIN HFA) 108 (90 Base) MCG/ACT inhaler Inhale 1-2 puffs into the lungs every 6 (six) hours as needed for wheezing or shortness of breath. 54 g 0  ? amitriptyline (ELAVIL) 10 MG tablet Take 1 tablet (10 mg total) by mouth at bedtime. 90 tablet 1  ? azelastine (ASTELIN) 0.1 % nasal spray Place 2 sprays into both nostrils 2 (two) times daily. 30 mL 5  ? Azelastine-Fluticasone (DYMISTA) 137-50 MCG/ACT SUSP Place 2 sprays into both nostrils 2 (two) times daily as needed. 23 g 5  ? CALCIUM PO Take 1 tablet by mouth  4 (four) times a week.    ? cetirizine (ZYRTEC) 10 MG tablet Take 2 tablets (20 mg total) by mouth 2 (two) times daily. 360 tablet 1  ? Cyanocobalamin (NASCOBAL NA) Place 1 spray into the nose every  Sunday.    ? diazepam (VALIUM) 10 MG tablet Take 1 tablet (10 mg total) by mouth at bedtime as needed for sleep. 30 tablet 2  ? EPINEPHrine 0.3 mg/0.3 mL IJ SOAJ injection Inject 0.3 mg into the muscle as needed for anaphylaxis. 2 each 1  ? fluconazole (DIFLUCAN) 150 MG tablet Take 1 tablet (150 mg total) by mouth daily. 14 tablet 2  ? fluticasone (FLONASE) 50 MCG/ACT nasal spray Place 2 sprays into both nostrils daily. 16 g 5  ? Fluticasone-Umeclidin-Vilant (TRELEGY ELLIPTA) 200-62.5-25 MCG/ACT AEPB Inhale 1 puff into the lungs daily. 60 each 5  ? Galcanezumab-gnlm (EMGALITY) 120 MG/ML SOAJ Inject 120 mg into the skin every 30 (thirty) days. 1 mL 11  ? Hypertonic Nasal Wash (SINUS RINSE REFILL) PACK Use one packet dissolved in water as needed. (Patient taking differently: Place 1 each into the nose in the morning and at bedtime.) 100 each 5  ? ibuprofen (ADVIL) 800 MG tablet Take 1 tablet (800 mg total) by mouth 3 (three) times daily as needed (pain.). 270 tablet 0  ? ipratropium (ATROVENT) 0.06 % nasal spray Place 2 sprays into both nostrils 4 (four) times daily. 45 mL 3  ? linaclotide (LINZESS) 145 MCG CAPS capsule Take 1 capsule (145 mcg total) by mouth daily before breakfast. 60 capsule 3  ? meclizine (ANTIVERT) 25 MG tablet Take 25 mg by mouth 3 (three) times daily as needed (dizziness/vertigo).     ? meloxicam (MOBIC) 15 MG tablet Take 1 tablet (15 mg total) by mouth daily. 30 tablet 0  ? MUCUS RELIEF ER 600 MG 12 hr tablet Take 600-1,200 mg by mouth 2 (two) times daily as needed (congestion).     ? ondansetron (ZOFRAN-ODT) 4 MG disintegrating tablet Take 1-2 tablets (4-8 mg total) by mouth every 8 (eight) hours as needed. May take with Rizatriptan 30 tablet 3  ? pantoprazole (PROTONIX) 40 MG tablet Take 1 tablet  (40 mg total) by mouth at bedtime. 90 tablet 3  ? Pediatric Multiple Vit-C-FA (MULTIVITAMIN ANIMAL SHAPES, WITH CA/FA,) with C & FA chewable tablet Chew 2 tablets by mouth daily.    ? rizatriptan (MAXA

## 2022-04-18 NOTE — Addendum Note (Signed)
Addended by: Sarina Ill B on: 04/18/2022 10:51 AM ? ? Modules accepted: Orders ? ?

## 2022-04-22 ENCOUNTER — Telehealth: Payer: Self-pay | Admitting: *Deleted

## 2022-04-22 ENCOUNTER — Ambulatory Visit (INDEPENDENT_AMBULATORY_CARE_PROVIDER_SITE_OTHER): Payer: 59

## 2022-04-22 DIAGNOSIS — J4550 Severe persistent asthma, uncomplicated: Secondary | ICD-10-CM

## 2022-04-22 NOTE — Telephone Encounter (Signed)
Audria Mey Key: [id number] - PA Case ID: [id number] help? Call us at [phone] ?Status ? ?PA nurtec complete  waiting on approval  ?

## 2022-04-23 ENCOUNTER — Other Ambulatory Visit: Payer: Self-pay

## 2022-04-23 ENCOUNTER — Other Ambulatory Visit (HOSPITAL_COMMUNITY): Payer: Self-pay

## 2022-04-24 ENCOUNTER — Other Ambulatory Visit (HOSPITAL_COMMUNITY): Payer: Self-pay

## 2022-04-24 NOTE — Progress Notes (Signed)
[name] D.[place] Phone: [phone]   Assessment and Plan:     1. Neck pain 2. Somatic dysfunction of cervical region 3. Somatic dysfunction of thoracic region 4. Somatic dysfunction of lumbar region 5. Somatic dysfunction of pelvic region 6. Somatic dysfunction of rib region -Chronic with exacerbation, subsequent visit - Continued neck pain that is limiting day-to-day activities with new left-sided radicular symptoms.  Patient has not improved with >6 weeks of conservative therapy including OMT, HEP, NSAIDs, trigger point injections. - Due to failure with conservative therapy, we will proceed with MRI of cervical spine without contrast and follow-up afterwards to discuss results and treatment plan - X-ray obtained in clinic.  My interpretation: No acute fracture or vertebral collapse.  - DG Cervical Spine 2 or 3 views; Future  - Patient has received significant relief with OMT in the past.  Elects for repeat OMT today.  Tolerated well per note below. - Decision today to treat with OMT was based on Physical Exam   After verbal consent patient was treated with HVLA (high velocity low amplitude), ME (muscle energy), FPR (flex positional release), ST (soft tissue), PC/PD (Pelvic Compression/ Pelvic Decompression) techniques in cervical, rib, thoracic, lumbar, and pelvic areas. Patient tolerated the procedure well with improvement in symptoms.  Patient educated on potential side effects of soreness and recommended to rest, hydrate, and use Tylenol as needed for pain control.   Pertinent previous records reviewed include none   Follow Up: 3 days after MRI to review results and discuss treatment plan   Subjective:   I, [name], am serving as a Education administrator for Doctor Glennon Mac  Chief Complaint: neck and shoulder pain    HPI:  01/31/2022 Patient is a 51 year old female complaining of neck and shoulder pain.  Patient states been going on for about 2 weeks has knots in her shoulders both neck and shoulders have been stiff , interested in OMT , knots in her upper trap , low back and her sciatic nerve on her right side flares up when she's having intercourse goes down her leg and upper her back    02/21/2022 Patient states that her neck and her shoulders are still bothering her after her adjustment she was very tight is still tight in the upper back , still wants the adjustment will get a massage later tonight as well  , hip still hurts , her tailbone she woke up one morning with pain     03/06/2022 Patient states still has upper trap tightness, finder her self leaning on that left side    03/28/2022 Patient states that she is still tight time for an adjustment would like to discuss a lower spot for CSI   04/25/2022 Patient states that her neck is heavy, would like a referral for MRI, still neck tightness and pain,     Relevant Historical Information: History of migraines being treated with Botox injections  Additional pertinent review of systems negative.  Current Outpatient Medications  Medication Sig Dispense Refill   acetaminophen (TYLENOL) 500 MG tablet Take by mouth.     acyclovir ointment (ZOVIRAX) 5 % Apply onto the affected area every 3 hours. 15 g 1   albuterol (VENTOLIN HFA) 108 (90 Base) MCG/ACT inhaler Inhale 1-2 puffs into the lungs every 6 (six) hours as needed for wheezing or shortness of breath. 54 g 0   amitriptyline (ELAVIL) 10 MG tablet Take 1 tablet (10  mg total) by mouth at bedtime. 90 tablet 1   azelastine (ASTELIN) 0.1 % nasal spray Place 2 sprays into both nostrils 2 (two) times daily. 30 mL 5   Azelastine-Fluticasone (DYMISTA) 137-50 MCG/ACT SUSP Place 2 sprays into both nostrils 2 (two) times daily as needed. 23 g 5   CALCIUM PO Take 1 tablet by mouth 4 (four) times a week.     cetirizine (ZYRTEC) 10 MG tablet Take 2 tablets (20 mg total) by mouth 2 (two) times daily. 360  tablet 1   Cyanocobalamin (NASCOBAL NA) Place 1 spray into the nose every Sunday.     diazepam (VALIUM) 10 MG tablet Take 1 tablet (10 mg total) by mouth at bedtime as needed for sleep. 30 tablet 2   EPINEPHrine 0.3 mg/0.3 mL IJ SOAJ injection Inject 0.3 mg into the muscle as needed for anaphylaxis. 2 each 1   fluconazole (DIFLUCAN) 150 MG tablet Take 1 tablet (150 mg total) by mouth daily. 14 tablet 2   fluticasone (FLONASE) 50 MCG/ACT nasal spray Place 2 sprays into both nostrils daily. 16 g 5   Fluticasone-Umeclidin-Vilant (TRELEGY ELLIPTA) 200-62.5-25 MCG/ACT AEPB Inhale 1 puff into the lungs daily. 60 each 5   Galcanezumab-gnlm (EMGALITY) 120 MG/ML SOAJ Inject 120 mg into the skin every 30 (thirty) days. 1 mL 11   Hypertonic Nasal Wash (SINUS RINSE REFILL) PACK Use one packet dissolved in water as needed. (Patient taking differently: Place 1 each into the nose in the morning and at bedtime.) 100 each 5   ibuprofen (ADVIL) 800 MG tablet Take 1 tablet (800 mg total) by mouth 3 (three) times daily as needed (pain.). 270 tablet 0   ipratropium (ATROVENT) 0.06 % nasal spray Place 2 sprays into both nostrils 4 (four) times daily. 45 mL 3   linaclotide (LINZESS) 145 MCG CAPS capsule Take 1 capsule (145 mcg total) by mouth daily before breakfast. 60 capsule 3   meclizine (ANTIVERT) 25 MG tablet Take 25 mg by mouth 3 (three) times daily as needed (dizziness/vertigo).      meloxicam (MOBIC) 15 MG tablet Take 1 tablet (15 mg total) by mouth daily. 30 tablet 0   MUCUS RELIEF ER 600 MG 12 hr tablet Take 600-1,200 mg by mouth 2 (two) times daily as needed (congestion).      ondansetron (ZOFRAN-ODT) 4 MG disintegrating tablet Take 1-2 tablets (4-8 mg total) by mouth every 8 (eight) hours as needed. May take with Rizatriptan 30 tablet 3   pantoprazole (PROTONIX) 40 MG tablet Take 1 tablet (40 mg total) by mouth at bedtime. 90 tablet 3   Pediatric Multiple Vit-C-FA (MULTIVITAMIN ANIMAL SHAPES, WITH CA/FA,) with  C & FA chewable tablet Chew 2 tablets by mouth daily.     Rimegepant Sulfate (NURTEC) 75 MG TBDP Dissolve 1 tablet by mouth daily as needed for migraines. Take as close to onset of migraine as possible. One daily maximum. 16 tablet 11   rizatriptan (MAXALT-MLT) 10 MG disintegrating tablet Dissolve 1 tablet (10 mg total) by mouth as needed for migraine. May repeat in 2 hours if needed 27 tablet 4   simethicone (MYLICON) 80 MG chewable tablet Chew 80 mg by mouth every 6 (six) hours as needed for flatulence.     tezepelumab-ekko (TEZSPIRE) 210 MG/1.91ML syringe Inject 1.91 mLs (210 mg total) into the skin every 28 (twenty-eight) days. 1.91 mL 11   tinidazole (TINDAMAX) 500 MG tablet Take 2 tablets (1,000 mg total) by mouth daily with breakfast for 5 days  10 tablet 0   tiZANidine (ZANAFLEX) 4 MG tablet Take 1 tablet by mouth every 8  hours as needed. 10 tablet 0   triamcinolone ointment (KENALOG) 0.1 % APPLY TO AFFECTED AREA(S) 2 TIMES A DAY 454 g 2   valACYclovir (VALTREX) 500 MG tablet Take 2 tablets (1,000 mg total) by mouth 2 (two) times daily. For acute flares 120 tablet 0   valACYclovir (VALTREX) 500 MG tablet Take 500 mg by mouth 2 (two) times daily.     Vibegron (GEMTESA) 75 MG TABS Take 1 tablet by mouth daily. 30 tablet 11   Vibegron (GEMTESA) 75 MG TABS Take 1 tablet by mouth daily. 90 tablet 0   fluticasone-salmeterol (ADVAIR HFA) 230-21 MCG/ACT inhaler INHALE 2 PUFFS INTO THE LUNGS 2 (TWO) TIMES DAILY. 36 g 1   Current Facility-Administered Medications  Medication Dose Route Frequency Provider Last Rate Last Admin   tezepelumab-ekko (TEZSPIRE) 210 MG/1.91ML syringe 210 mg  210 mg Subcutaneous Q28 days Valentina Shaggy, MD   210 mg at 04/22/22 1718      Objective:     Vitals:   04/25/22 0900  BP: 110/80  Weight: 170 lb (77.1 kg)  Height: '5\' 6"'$  (1.676 m)      Body mass index is 27.44 kg/m.    Physical Exam:     General: Well-appearing, cooperative, sitting comfortably in  no acute distress.   OMT Physical Exam:  ASIS Compression Test: Positive Right Cervical: TTP paraspinal, C3-5 RR SR Rib: Bilateral elevated first rib with TTP Thoracic: TTP paraspinal, T4 RSR Lumbar: TTP paraspinal, L1-4 RR SL Pelvis: Right anterior innominate with out flare  Cervical Spine: Posture normal Skin: normal, intact  Neurological:   Strength:  Right  Left   Deltoid 5/5 5/5  Bicep 5/5  5/5  Tricep 5/5 5/5  Wrist Flexion 5/5 5/5  Wrist Extension 5/5 5/5  Grip 5/5 5/5  Finger Abduction 5/5 5/5   Sensation: intact to light touch in upper extremities bilaterally  Spurling's:  negative bilaterally Neck ROM: Full active ROM TTP: cervical paraspinal, thoracic paraspinal, trapezius NTTP: cervical spinous processes,   Electronically signed by:  [name] D.[facility] 9:36 AM 04/25/22

## 2022-04-25 ENCOUNTER — Ambulatory Visit (INDEPENDENT_AMBULATORY_CARE_PROVIDER_SITE_OTHER): Payer: 59 | Admitting: Sports Medicine

## 2022-04-25 ENCOUNTER — Other Ambulatory Visit (HOSPITAL_COMMUNITY): Payer: Self-pay

## 2022-04-25 ENCOUNTER — Ambulatory Visit (INDEPENDENT_AMBULATORY_CARE_PROVIDER_SITE_OTHER): Payer: 59

## 2022-04-25 VITALS — BP 110/80 | Ht 66.0 in | Wt 170.0 lb

## 2022-04-25 DIAGNOSIS — M9902 Segmental and somatic dysfunction of thoracic region: Secondary | ICD-10-CM

## 2022-04-25 DIAGNOSIS — M9905 Segmental and somatic dysfunction of pelvic region: Secondary | ICD-10-CM

## 2022-04-25 DIAGNOSIS — M9908 Segmental and somatic dysfunction of rib cage: Secondary | ICD-10-CM

## 2022-04-25 DIAGNOSIS — M9903 Segmental and somatic dysfunction of lumbar region: Secondary | ICD-10-CM

## 2022-04-25 DIAGNOSIS — M542 Cervicalgia: Secondary | ICD-10-CM

## 2022-04-25 DIAGNOSIS — M9901 Segmental and somatic dysfunction of cervical region: Secondary | ICD-10-CM

## 2022-04-25 NOTE — Patient Instructions (Addendum)
Good to see you  Xray on the way out  MRI referral  Follow up 3 days after your MRI to discuss results

## 2022-04-30 ENCOUNTER — Encounter: Payer: Self-pay | Admitting: Adult Health

## 2022-05-01 ENCOUNTER — Other Ambulatory Visit (HOSPITAL_COMMUNITY): Payer: Self-pay

## 2022-05-01 ENCOUNTER — Other Ambulatory Visit: Payer: Self-pay | Admitting: Adult Health

## 2022-05-01 DIAGNOSIS — G43919 Migraine, unspecified, intractable, without status migrainosus: Secondary | ICD-10-CM

## 2022-05-02 ENCOUNTER — Other Ambulatory Visit (HOSPITAL_COMMUNITY): Payer: Self-pay

## 2022-05-02 ENCOUNTER — Ambulatory Visit (INDEPENDENT_AMBULATORY_CARE_PROVIDER_SITE_OTHER): Payer: 59 | Admitting: Plastic Surgery

## 2022-05-02 ENCOUNTER — Encounter: Payer: Self-pay | Admitting: Plastic Surgery

## 2022-05-02 VITALS — BP 134/81 | HR 76 | Ht 66.0 in | Wt 168.0 lb

## 2022-05-02 DIAGNOSIS — Z9884 Bariatric surgery status: Secondary | ICD-10-CM | POA: Diagnosis not present

## 2022-05-02 DIAGNOSIS — M793 Panniculitis, unspecified: Secondary | ICD-10-CM

## 2022-05-02 DIAGNOSIS — R21 Rash and other nonspecific skin eruption: Secondary | ICD-10-CM

## 2022-05-02 MED ORDER — IBUPROFEN 800 MG PO TABS
800.0000 mg | ORAL_TABLET | Freq: Three times a day (TID) | ORAL | 0 refills | Status: DC | PRN
  Filled 2022-05-02: qty 270, 90d supply, fill #0

## 2022-05-02 MED ORDER — VALACYCLOVIR HCL 500 MG PO TABS
1000.0000 mg | ORAL_TABLET | Freq: Two times a day (BID) | ORAL | 0 refills | Status: DC
Start: 2022-05-02 — End: 2022-06-03
  Filled 2022-05-02: qty 120, 30d supply, fill #0

## 2022-05-02 NOTE — Progress Notes (Signed)
Referring Provider Dorothyann Peng, NP [place],  [place] [address]   CC:  Abdominal pannus and rashes   Donna Park is an 51 y.o. female.  HPI: Patient is a 51 year old who has been having abdominal rashes under her pannus.  She uses hydrocortisone cream to treat this.  She had bariatric surgery in 2016 and a hysterectomy in 2021.  She is lost 170 pounds.  Her current BMI is 27.  She is also interested in improvement of her thighs.    Allergies  Allergen Reactions   Cyclobenzaprine Shortness Of Breath and Other (See Comments)    Relaxed tongue    Shrimp Flavor Anaphylaxis   Sulfa Antibiotics Anaphylaxis, Hives and Swelling   Carisoprodol Hives   Metronidazole     not allergic; drug doesn't work for pt   Morphine Other (See Comments)    Feels like her head is going to "blow up"     Topiramate     Brain fog   Adhesive [Tape] Rash    Other reaction(s): SKIN - rash (skin burns) PAPER TAPE IS OK    Tramadol Itching, Nausea And Vomiting and Rash    Outpatient Encounter Medications as of 05/02/2022  Medication Sig   acetaminophen (TYLENOL) 500 MG tablet Take by mouth.   acyclovir ointment (ZOVIRAX) 5 % Apply onto the affected area every 3 hours.   albuterol (VENTOLIN HFA) 108 (90 Base) MCG/ACT inhaler Inhale 1 - 2 puffs into the lungs every 6 hours as needed for wheezing or shortness of breath.   amitriptyline (ELAVIL) 10 MG tablet Take 1 tablet (10 mg total) by mouth at bedtime.   azelastine (ASTELIN) 0.1 % nasal spray Place 2 sprays into both nostrils 2 (two) times daily.   Azelastine-Fluticasone (DYMISTA) 137-50 MCG/ACT SUSP Place 2 sprays into both nostrils 2 (two) times daily as needed.   CALCIUM PO Take 1 tablet by mouth 4 (four) times a week.   cetirizine (ZYRTEC) 10 MG tablet Take 2 tablets (20 mg total) by mouth 2 (two) times daily.   Cyanocobalamin (NASCOBAL NA) Place 1 spray into the nose every Sunday.   diazepam (VALIUM) 10 MG tablet Take  1 tablet (10 mg total) by mouth at bedtime as needed for sleep.   EPINEPHrine 0.3 mg/0.3 mL IJ SOAJ injection Inject 0.3 mg into the muscle as needed for anaphylaxis.   fluconazole (DIFLUCAN) 150 MG tablet Take 1 tablet (150 mg total) by mouth daily.   fluticasone (FLONASE) 50 MCG/ACT nasal spray Place 2 sprays into both nostrils daily.   Fluticasone-Umeclidin-Vilant (TRELEGY ELLIPTA) 200-62.5-25 MCG/ACT AEPB Inhale 1 puff into the lungs daily.   Galcanezumab-gnlm (EMGALITY) 120 MG/ML SOAJ Inject 120 mg into the skin every 30 (thirty) days.   Hypertonic Nasal Wash (SINUS RINSE REFILL) PACK Use one packet dissolved in water as needed. (Patient taking differently: Place 1 each into the nose in the morning and at bedtime.)   ibuprofen (ADVIL) 800 MG tablet Take 1 tablet (800 mg total) by mouth 3 (three) times daily as needed (pain.).   ipratropium (ATROVENT) 0.06 % nasal spray Place 2 sprays into both nostrils 4 (four) times daily.   linaclotide (LINZESS) 145 MCG CAPS capsule Take 1 capsule (145 mcg total) by mouth daily before breakfast.   meclizine (ANTIVERT) 25 MG tablet Take 25 mg by mouth 3 (three) times daily as needed (dizziness/vertigo).    meloxicam (MOBIC) 15 MG tablet Take 1 tablet (15 mg total) by mouth daily.  MUCUS RELIEF ER 600 MG 12 hr tablet Take 600-1,200 mg by mouth 2 (two) times daily as needed (congestion).    ondansetron (ZOFRAN-ODT) 4 MG disintegrating tablet Take 1-2 tablets (4-8 mg total) by mouth every 8 (eight) hours as needed. May take with Rizatriptan   pantoprazole (PROTONIX) 40 MG tablet Take 1 tablet (40 mg total) by mouth at bedtime.   Pediatric Multiple Vit-C-FA (MULTIVITAMIN ANIMAL SHAPES, WITH CA/FA,) with C & FA chewable tablet Chew 2 tablets by mouth daily.   Rimegepant Sulfate (NURTEC) 75 MG TBDP Dissolve 1 tablet by mouth daily as needed for migraines. Take as close to onset of migraine as possible. One daily maximum.   rizatriptan (MAXALT-MLT) 10 MG  disintegrating tablet Dissolve 1 tablet (10 mg total) by mouth as needed for migraine. May repeat in 2 hours if needed   simethicone (MYLICON) 80 MG chewable tablet Chew 80 mg by mouth every 6 (six) hours as needed for flatulence.   tezepelumab-ekko (TEZSPIRE) 210 MG/1.91ML syringe Inject 1.91 mLs (210 mg total) into the skin every 28 (twenty-eight) days.   tinidazole (TINDAMAX) 500 MG tablet Take 2 tablets (1,000 mg total) by mouth daily with breakfast for 5 days   tiZANidine (ZANAFLEX) 4 MG tablet Take 1 tablet by mouth every 8  hours as needed.   triamcinolone ointment (KENALOG) 0.1 % APPLY TO AFFECTED AREA(S) 2 TIMES A DAY   valACYclovir (VALTREX) 500 MG tablet Take 2 tablets (1,000 mg total) by mouth 2 (two) times daily. For acute flares   valACYclovir (VALTREX) 500 MG tablet Take 500 mg by mouth 2 (two) times daily.   Vibegron (GEMTESA) 75 MG TABS Take 1 tablet by mouth daily.   Vibegron (GEMTESA) 75 MG TABS Take 1 tablet by mouth daily.   fluticasone-salmeterol (ADVAIR HFA) 230-21 MCG/ACT inhaler INHALE 2 PUFFS INTO THE LUNGS 2 (TWO) TIMES DAILY.   Facility-Administered Encounter Medications as of 05/02/2022  Medication   tezepelumab-ekko (TEZSPIRE) 210 MG/1.91ML syringe 210 mg     Past Medical History:  Diagnosis Date   Allergy    Anxiety    Arthritis    Asthma    Chicken pox    Complication of anesthesia    slow to wake up from anesthesia   Depression    DJD (degenerative joint disease)    GERD (gastroesophageal reflux disease)    resolved after gastric surgery   H/O gastric bypass 2016   History of iron deficiency anemia    HSV infection    Migraines    Obese    Pneumonia    Childhood history   PONV (postoperative nausea and vomiting)    Status post dilation of esophageal narrowing    Varicose vein of leg     Past Surgical History:  Procedure Laterality Date   ANKLE FRACTURE SURGERY Left 03/08/2009   BREAST SURGERY Bilateral    Cyst Removal   GASTRIC BYPASS   06/18/2015   LAPAROSCOPIC VAGINAL HYSTERECTOMY WITH SALPINGECTOMY Bilateral 05/15/2020   Procedure: LAPAROSCOPIC ASSISTED VAGINAL HYSTERECTOMY WITH SALPINGECTOMY;  Surgeon: Louretta Shorten, MD;  Location[facility];  Service: Gynecology;  Laterality: Bilateral;  need bed   LASER ABLATION     Varicose veins   TONSILLECTOMY AND ADENOIDECTOMY  2001   TUBAL LIGATION  12/19/1999   UPPER GASTROINTESTINAL ENDOSCOPY     UPPER GI ENDOSCOPY     Uterine ablation  12/2018    Family History  Problem Relation Age of Onset   COPD Mother  Heart disease Mother    Alcohol abuse Mother    Depression Mother    Drug abuse Mother    High Cholesterol Mother    High blood pressure Mother    COPD Father    Aneurysm Father    Heart disease Father    Early death Father    Diabetes Father    Drug abuse Father    AAA (abdominal aortic aneurysm) Father        cause of death at 34   Arthritis Sister    Kidney disease Sister    Crohn's disease Sister    Ulcerative colitis Sister    Thyroid disease Sister    Kidney disease Brother    Kidney disease Maternal Aunt    Diabetes Paternal Aunt    Asthma Neg Hx    Allergic rhinitis Neg Hx    Colon cancer Neg Hx    Rectal cancer Neg Hx    Stomach cancer Neg Hx     Social History   Social History Narrative   Not on file     Review of Systems General: Denies fevers, chills, weight loss CV: Denies chest pain, shortness of breath, palpitations   Physical Exam    05/02/2022    9:12 AM 04/25/2022    9:00 AM 04/18/2022    8:23 AM  Vitals with BMI  Height '5\' 6"'$  '5\' 6"'$  '5\' 6"'$   Weight 168 lbs 170 lbs 172 lbs  BMI 27.13 52.84 13.24  Systolic 401 027 253  Diastolic 81 80 80  Pulse 76  72    General:  No acute distress,  Alert and oriented, Non-Toxic, Normal speech and affect Abdomen: Patient has some abdominal pannus.  She does have some skin irritation under this.  She has significant excess skin above and below her umbilicus.  She does  not have significant adipose. Extremity: Some adipose bilateral thighs, does not have a huge amount of excess skin but does have some cobblestoning.  Assessment/Plan 1) abdominal panniculectomy may help improve patient's rashes and panniculitis. 2) patient would have additional benefit if she has had on transposition of the umbilicus and plication of the rectus muscles. 3) given the contour of her thighs I have recommended thigh liposuction primarily medially rather than thigh lift.    Lennice Sites 05/02/2022, 12:39 PM

## 2022-05-06 ENCOUNTER — Other Ambulatory Visit (HOSPITAL_COMMUNITY): Payer: Self-pay

## 2022-05-07 NOTE — Telephone Encounter (Signed)
Spoke to pt and advised that some insurance do not cover X-rays before a visit. Advised pt that she could contact insurance company to see if it is approved. Pt declined and stated she will wait until her Friday visit with [facility].

## 2022-05-08 ENCOUNTER — Other Ambulatory Visit (HOSPITAL_COMMUNITY): Payer: Self-pay

## 2022-05-09 ENCOUNTER — Other Ambulatory Visit (HOSPITAL_COMMUNITY): Payer: Self-pay

## 2022-05-09 ENCOUNTER — Encounter: Payer: Self-pay | Admitting: Adult Health

## 2022-05-09 ENCOUNTER — Ambulatory Visit (INDEPENDENT_AMBULATORY_CARE_PROVIDER_SITE_OTHER): Payer: 59 | Admitting: Adult Health

## 2022-05-09 ENCOUNTER — Ambulatory Visit (INDEPENDENT_AMBULATORY_CARE_PROVIDER_SITE_OTHER): Payer: 59

## 2022-05-09 ENCOUNTER — Encounter: Payer: Self-pay | Admitting: Gastroenterology

## 2022-05-09 ENCOUNTER — Ambulatory Visit (INDEPENDENT_AMBULATORY_CARE_PROVIDER_SITE_OTHER): Payer: 59 | Admitting: Gastroenterology

## 2022-05-09 VITALS — BP 110/70 | HR 90 | Ht 66.0 in | Wt 173.0 lb

## 2022-05-09 VITALS — BP 120/60 | HR 108 | Temp 98.0°F | Ht 66.0 in | Wt 173.0 lb

## 2022-05-09 DIAGNOSIS — K5900 Constipation, unspecified: Secondary | ICD-10-CM | POA: Diagnosis not present

## 2022-05-09 DIAGNOSIS — E538 Deficiency of other specified B group vitamins: Secondary | ICD-10-CM | POA: Diagnosis not present

## 2022-05-09 DIAGNOSIS — M25572 Pain in left ankle and joints of left foot: Secondary | ICD-10-CM

## 2022-05-09 DIAGNOSIS — Z01419 Encounter for gynecological examination (general) (routine) without abnormal findings: Secondary | ICD-10-CM | POA: Diagnosis not present

## 2022-05-09 DIAGNOSIS — Z1272 Encounter for screening for malignant neoplasm of vagina: Secondary | ICD-10-CM | POA: Diagnosis not present

## 2022-05-09 DIAGNOSIS — Z113 Encounter for screening for infections with a predominantly sexual mode of transmission: Secondary | ICD-10-CM | POA: Diagnosis not present

## 2022-05-09 DIAGNOSIS — N959 Unspecified menopausal and perimenopausal disorder: Secondary | ICD-10-CM | POA: Diagnosis not present

## 2022-05-09 DIAGNOSIS — Z1231 Encounter for screening mammogram for malignant neoplasm of breast: Secondary | ICD-10-CM | POA: Diagnosis not present

## 2022-05-09 DIAGNOSIS — Z6827 Body mass index (BMI) 27.0-27.9, adult: Secondary | ICD-10-CM | POA: Diagnosis not present

## 2022-05-09 DIAGNOSIS — R1319 Other dysphagia: Secondary | ICD-10-CM

## 2022-05-09 DIAGNOSIS — N3281 Overactive bladder: Secondary | ICD-10-CM | POA: Diagnosis not present

## 2022-05-09 DIAGNOSIS — N760 Acute vaginitis: Secondary | ICD-10-CM | POA: Diagnosis not present

## 2022-05-09 MED ORDER — ESTRADIOL 2 MG PO TABS
2.0000 mg | ORAL_TABLET | Freq: Every day | ORAL | 12 refills | Status: DC
  Filled 2022-05-09: qty 90, 90d supply, fill #0
  Filled 2022-07-26: qty 90, 90d supply, fill #1
  Filled 2022-10-20: qty 90, 90d supply, fill #2

## 2022-05-09 MED ORDER — ESTRADIOL 2 MG PO TABS: 2.0000 mg | ORAL_TABLET | Freq: Every day | ORAL | 12 refills | Status: DC

## 2022-05-09 MED ORDER — GEMTESA 75 MG PO TABS
1.0000 | ORAL_TABLET | Freq: Every day | ORAL | 4 refills | Status: DC
  Filled 2022-05-09 – 2022-07-26 (×2): qty 90, 90d supply, fill #0
  Filled 2022-10-20: qty 90, 90d supply, fill #1

## 2022-05-09 NOTE — Progress Notes (Signed)
Chief Complaint:    Constipation, dysphagia  GI History: 51 year old with history of  RYGB in 06/2015, migraines and medical history as below initially seen in GI clinic on 02/21/2022 for screening colonoscopy evaluation of GI symptoms.    History of constipation, with episodic loose stools.  Does have associated abdominal bloating and LUQ discomfort.  Has previously trialed fiber, MiraLAX, senna, lactulose.  Reports a history of gastric ulcer in the past when living in [place].  Separately with intermittent solid food dysphagia.  Family history notable for sister with Crohn's disease.  - 02/21/2022: Initial appointment in GI clinic - 03/21/2022: EGD: Normal esophagus dilated with 17 mm Savary without mucosal rent.  Normal Z-line.  Healthy-appearing Roux-en-Y gastric bypass anatomy (path benign), normal small bowel (path benign) - 03/21/2022: Colonoscopy: 2 polyps 3-5 mm (path: TA x1, HP x1), Otherwise normal appearing colon (path benign without MC, IBD).  Grade 1 internal hemorrhoids.  Normal TI.  Repeat in 7 years - 04/04/2022: Normal CBC, CMP, TSH, HCV, HIV, vitamin D.  Ferritin 11, iron 50, TIBC 393, sat 12.7%.  B12 206, folate 6.2.  Vitamin A 34.  PCM recommended increasing dietary vitamin A  HPI:     Patient is a 51 y.o. female presenting to the Gastroenterology Clinic for follow-up.   Constipation has improved since starting Linzess last month.  Has changed to 145 mcg QOD which works well for her.  Not requiring any additional laxatives, stool softeners, etc.  Separately, still having issues with dysphagia. Feels like she is having to generate more force to swallow. No aspiration, cough, etc. No issues with mastication. Points to anterior neck. Some improvement with recent EGD with empiric dilation; able to tolerate pills w/o issue now, but still with intermittent sxs with solids.   Has started MVI with B12, folate, iron along with daily Vit A due to deficiencies as outlined  above.   Review of systems:     No chest pain, no SOB, no fevers, no urinary sx   Past Medical History:  Diagnosis Date   Allergy    Anxiety    Arthritis    Asthma    Chicken pox    Complication of anesthesia    slow to wake up from anesthesia   Depression    DJD (degenerative joint disease)    GERD (gastroesophageal reflux disease)    resolved after gastric surgery   H/O gastric bypass 2016   History of iron deficiency anemia    HSV infection    Migraines    Obese    Pneumonia    Childhood history   PONV (postoperative nausea and vomiting)    Status post dilation of esophageal narrowing    Varicose vein of leg     Patient's surgical history, family medical history, social history, medications and allergies were all reviewed in Epic    Current Outpatient Medications  Medication Sig Dispense Refill   acetaminophen (TYLENOL) 500 MG tablet Take by mouth.     acyclovir ointment (ZOVIRAX) 5 % Apply onto the affected area every 3 hours. 15 g 1   albuterol (VENTOLIN HFA) 108 (90 Base) MCG/ACT inhaler Inhale 1 - 2 puffs into the lungs every 6 hours as needed for wheezing or shortness of breath. 54 g 0   amitriptyline (ELAVIL) 10 MG tablet Take 1 tablet (10 mg total) by mouth at bedtime. 90 tablet 1   azelastine (ASTELIN) 0.1 % nasal spray Place 2 sprays into both nostrils 2 (two) times daily.  30 mL 5   Azelastine-Fluticasone (DYMISTA) 137-50 MCG/ACT SUSP Place 2 sprays into both nostrils 2 (two) times daily as needed. 23 g 5   CALCIUM PO Take 1 tablet by mouth 4 (four) times a week.     cetirizine (ZYRTEC) 10 MG tablet Take 2 tablets (20 mg total) by mouth 2 (two) times daily. 360 tablet 1   Cyanocobalamin (NASCOBAL NA) Place 1 spray into the nose every Sunday.     diazepam (VALIUM) 10 MG tablet Take 1 tablet (10 mg total) by mouth at bedtime as needed for sleep. 30 tablet 2   EPINEPHrine 0.3 mg/0.3 mL IJ SOAJ injection Inject 0.3 mg into the muscle as needed for anaphylaxis. 2  each 1   estradiol (ESTRACE) 2 MG tablet Take 1 tablet (2 mg total) by mouth daily. 90 tablet 12   estradiol (ESTRACE) 2 MG tablet Take 1 tablet (2 mg total) by mouth daily. 30 tablet 12   fluconazole (DIFLUCAN) 150 MG tablet Take 1 tablet (150 mg total) by mouth daily. 14 tablet 2   fluticasone (FLONASE) 50 MCG/ACT nasal spray Place 2 sprays into both nostrils daily. 16 g 5   Fluticasone-Umeclidin-Vilant (TRELEGY ELLIPTA) 200-62.5-25 MCG/ACT AEPB Inhale 1 puff into the lungs daily. 60 each 5   Galcanezumab-gnlm (EMGALITY) 120 MG/ML SOAJ Inject 120 mg into the skin every 30 (thirty) days. 1 mL 11   Hypertonic Nasal Wash (SINUS RINSE REFILL) PACK Use one packet dissolved in water as needed. (Patient taking differently: Place 1 each into the nose in the morning and at bedtime.) 100 each 5   ibuprofen (ADVIL) 800 MG tablet Take 1 tablet (800 mg total) by mouth 3 (three) times daily as needed (pain.). 270 tablet 0   ipratropium (ATROVENT) 0.06 % nasal spray Place 2 sprays into both nostrils 4 (four) times daily. 45 mL 3   linaclotide (LINZESS) 145 MCG CAPS capsule Take 1 capsule (145 mcg total) by mouth daily before breakfast. 60 capsule 3   meclizine (ANTIVERT) 25 MG tablet Take 25 mg by mouth 3 (three) times daily as needed (dizziness/vertigo).      meloxicam (MOBIC) 15 MG tablet Take 1 tablet (15 mg total) by mouth daily. 30 tablet 0   MUCUS RELIEF ER 600 MG 12 hr tablet Take 600-1,200 mg by mouth 2 (two) times daily as needed (congestion).      ondansetron (ZOFRAN-ODT) 4 MG disintegrating tablet Take 1-2 tablets (4-8 mg total) by mouth every 8 (eight) hours as needed. May take with Rizatriptan 30 tablet 3   pantoprazole (PROTONIX) 40 MG tablet Take 1 tablet (40 mg total) by mouth at bedtime. 90 tablet 3   Pediatric Multiple Vit-C-FA (MULTIVITAMIN ANIMAL SHAPES, WITH CA/FA,) with C & FA chewable tablet Chew 2 tablets by mouth daily.     Rimegepant Sulfate (NURTEC) 75 MG TBDP Dissolve 1 tablet by  mouth daily as needed for migraines. Take as close to onset of migraine as possible. One daily maximum. 16 tablet 11   rizatriptan (MAXALT-MLT) 10 MG disintegrating tablet Dissolve 1 tablet (10 mg total) by mouth as needed for migraine. May repeat in 2 hours if needed 27 tablet 4   simethicone (MYLICON) 80 MG chewable tablet Chew 80 mg by mouth every 6 (six) hours as needed for flatulence.     tezepelumab-ekko (TEZSPIRE) 210 MG/1.91ML syringe Inject 1.91 mLs (210 mg total) into the skin every 28 (twenty-eight) days. 1.91 mL 11   tiZANidine (ZANAFLEX) 4 MG tablet Take 1 tablet  by mouth every 8  hours as needed. 10 tablet 0   triamcinolone ointment (KENALOG) 0.1 % APPLY TO AFFECTED AREA(S) 2 TIMES A DAY 454 g 2   valACYclovir (VALTREX) 500 MG tablet Take 500 mg by mouth 2 (two) times daily.     valACYclovir (VALTREX) 500 MG tablet Take 2 tablets (1,000 mg total) by mouth 2 (two) times daily as needed for acute flares 120 tablet 0   Vibegron (GEMTESA) 75 MG TABS Take 1 tablet by mouth daily. 30 tablet 11   Vibegron (GEMTESA) 75 MG TABS Take 1 tablet by mouth daily. 90 tablet 4   fluticasone-salmeterol (ADVAIR HFA) 230-21 MCG/ACT inhaler INHALE 2 PUFFS INTO THE LUNGS 2 (TWO) TIMES DAILY. 36 g 1   Current Facility-Administered Medications  Medication Dose Route Frequency Provider Last Rate Last Admin   tezepelumab-ekko (TEZSPIRE) 210 MG/1.91ML syringe 210 mg  210 mg Subcutaneous Q28 days Valentina Shaggy, MD   210 mg at 04/22/22 1718    Physical Exam:     BP 110/70   Pulse 90   Ht '5\' 6"'$  (1.676 m)   Wt 173 lb (78.5 kg)   BMI 27.92 kg/m   GENERAL:  Pleasant female in NAD PSYCH: : Cooperative, normal affect Musculoskeletal:  Normal muscle tone, normal strength NEURO: Alert and oriented x 3, no focal neurologic deficits   IMPRESSION and PLAN:    1) Chronic constipation - Symptoms improving since starting Linzess - Continue Linzess qod as currently doing - Continue adequate hydration  with at least 64 ounces of water/day  2) Dysphagia - Some improvement with recent EGD with empiric dilation.  Able to tolerate pills without issue now, but still issues with intermittent solid food dysphagia.  EGD was otherwise unrevealing for mucosal/luminal pathology - Schedule Esophageal Manometry - Continue cutting food in small pieces, chewing thoroughly, and drink plenty fluids with meals  3) Folate deficiency 4) B12 deficiency 5) Mild iron deficiency without anemia - Related to prior Roux-en-Y gastric bypass - Started multivitamin which contains folate, B12, iron, etc. - Recheck labs in 6 months to ensure adequate response to oral therapy  6) History of colon polyps - Repeat colonoscopy in 7 years (2030) for ongoing polyp surveillance      Lavena Bullion ,DO, FACG 05/09/2022, 11:19 AM

## 2022-05-09 NOTE — Progress Notes (Signed)
Subjective:    Patient ID: Donna Park, female    DOB: [birth year], 51 y.o.   MRN: [id number]  HPI 51 year old female who  has a past medical history of Allergy, Anxiety, Arthritis, Asthma, Chicken pox, Complication of anesthesia, Depression, DJD (degenerative joint disease), GERD (gastroesophageal reflux disease), H/O gastric bypass (2016), History of iron deficiency anemia, HSV infection, Migraines, Obese, Pneumonia, PONV (postoperative nausea and vomiting), Status post dilation of esophageal narrowing, and Varicose vein of leg.  She is being evaluated today for left ankle pain.  She reports breaking her ankle in 2010.   She still has plates and screws in the left ankle.   She reports that over the last 2 weeks she has had more discomfort along the medial and front side of her left ankle with range of motion movements. .  She has also noticed some more soft tissue swelling and has also experiencing a popping sensation.  She does feel discomfort along the medial aspect when she ties her shoes.  Denies trauma or aggravating injury.  In care everywhere it is noted from last xray of left ankle dated 07/30/2009 1. Instrumented medial and lateral malleolus fractures. No  complication is identified.  2. Persistent ankle soft tissue swelling, especially  medially.   Review of Systems See HPI   Past Medical History:  Diagnosis Date   Allergy    Anxiety    Arthritis    Asthma    Chicken pox    Complication of anesthesia    slow to wake up from anesthesia   Depression    DJD (degenerative joint disease)    GERD (gastroesophageal reflux disease)    resolved after gastric surgery   H/O gastric bypass 2016   History of iron deficiency anemia    HSV infection    Migraines    Obese    Pneumonia    Childhood history   PONV (postoperative nausea and vomiting)    Status post dilation of esophageal narrowing    Varicose vein of leg     Social History   Socioeconomic History    Marital status: Married    Spouse name: Not on file   Number of children: 4   Years of education: Not on file   Highest education level: Associate degree: academic program  Occupational History   Not on file  Tobacco Use   Smoking status: Never   Smokeless tobacco: Never  Vaping Use   Vaping Use: Never used  Substance and Sexual Activity   Alcohol use: Yes    Comment: Social/Rare   Drug use: Never   Sexual activity: Yes    Birth control/protection: Surgical  Other Topics Concern   Not on file  Social History Narrative   Not on file   Social Determinants of Health   Financial Resource Strain: Medium Risk   Difficulty of Paying Living Expenses: Somewhat hard  Food Insecurity: No Food Insecurity   Worried About Charity fundraiser in the Last Year: Never true   Ran Out of Food in the Last Year: Never true  Transportation Needs: No Transportation Needs   Lack of Transportation (Medical): No   Lack of Transportation (Non-Medical): No  Physical Activity: Sufficiently Active   Days of Exercise per Week: 5 days   Minutes of Exercise per Session: 150+ min  Stress: Stress Concern Present   Feeling of Stress : Very much  Social Connections: Moderately Integrated   Frequency of Communication with Friends and  Family: More than three times a week   Frequency of Social Gatherings with Friends and Family: Never   Attends Religious Services: More than 4 times per year   Active Member of Clubs or Organizations: Yes   Attends Music therapist: More than 4 times per year   Marital Status: Separated  Intimate Partner Violence: Not on file    Past Surgical History:  Procedure Laterality Date   ANKLE FRACTURE SURGERY Left 03/08/2009   BREAST SURGERY Bilateral    Cyst Removal   GASTRIC BYPASS  06/18/2015   LAPAROSCOPIC VAGINAL HYSTERECTOMY WITH SALPINGECTOMY Bilateral 05/15/2020   Procedure: LAPAROSCOPIC ASSISTED VAGINAL HYSTERECTOMY WITH SALPINGECTOMY;  Surgeon: Louretta Shorten, MD;  Location: [facility];  Service: Gynecology;  Laterality: Bilateral;  need bed   LASER ABLATION     Varicose veins   TONSILLECTOMY AND ADENOIDECTOMY  2001   TUBAL LIGATION  12/19/1999   UPPER GASTROINTESTINAL ENDOSCOPY     UPPER GI ENDOSCOPY     Uterine ablation  12/2018    Family History  Problem Relation Age of Onset   COPD Mother    Heart disease Mother    Alcohol abuse Mother    Depression Mother    Drug abuse Mother    High Cholesterol Mother    High blood pressure Mother    COPD Father    Aneurysm Father    Heart disease Father    Early death Father    Diabetes Father    Drug abuse Father    AAA (abdominal aortic aneurysm) Father        cause of death at 34   Arthritis Sister    Kidney disease Sister    Crohn's disease Sister    Ulcerative colitis Sister    Thyroid disease Sister    Kidney disease Brother    Kidney disease Maternal Aunt    Diabetes Paternal Aunt    Asthma Neg Hx    Allergic rhinitis Neg Hx    Colon cancer Neg Hx    Rectal cancer Neg Hx    Stomach cancer Neg Hx     Allergies  Allergen Reactions   Cyclobenzaprine Shortness Of Breath and Other (See Comments)    Relaxed tongue    Shrimp Flavor Anaphylaxis   Sulfa Antibiotics Anaphylaxis, Hives and Swelling   Carisoprodol Hives   Metronidazole     not allergic; drug doesn't work for pt   Morphine Other (See Comments)    Feels like her head is going to "blow up"     Topiramate     Brain fog   Adhesive [Tape] Rash    Other reaction(s): SKIN - rash (skin burns) PAPER TAPE IS OK    Tramadol Itching, Nausea And Vomiting and Rash    Current Outpatient Medications on File Prior to Visit  Medication Sig Dispense Refill   acetaminophen (TYLENOL) 500 MG tablet Take by mouth.     acyclovir ointment (ZOVIRAX) 5 % Apply onto the affected area every 3 hours. 15 g 1   albuterol (VENTOLIN HFA) 108 (90 Base) MCG/ACT inhaler Inhale 1 - 2 puffs into the lungs every 6 hours  as needed for wheezing or shortness of breath. 54 g 0   amitriptyline (ELAVIL) 10 MG tablet Take 1 tablet (10 mg total) by mouth at bedtime. 90 tablet 1   azelastine (ASTELIN) 0.1 % nasal spray Place 2 sprays into both nostrils 2 (two) times daily. 30 mL 5   Azelastine-Fluticasone (DYMISTA)  137-50 MCG/ACT SUSP Place 2 sprays into both nostrils 2 (two) times daily as needed. 23 g 5   CALCIUM PO Take 1 tablet by mouth 4 (four) times a week.     cetirizine (ZYRTEC) 10 MG tablet Take 2 tablets (20 mg total) by mouth 2 (two) times daily. 360 tablet 1   Cyanocobalamin (NASCOBAL NA) Place 1 spray into the nose every Sunday.     diazepam (VALIUM) 10 MG tablet Take 1 tablet (10 mg total) by mouth at bedtime as needed for sleep. 30 tablet 2   EPINEPHrine 0.3 mg/0.3 mL IJ SOAJ injection Inject 0.3 mg into the muscle as needed for anaphylaxis. 2 each 1   fluconazole (DIFLUCAN) 150 MG tablet Take 1 tablet (150 mg total) by mouth daily. 14 tablet 2   fluticasone (FLONASE) 50 MCG/ACT nasal spray Place 2 sprays into both nostrils daily. 16 g 5   fluticasone-salmeterol (ADVAIR HFA) 230-21 MCG/ACT inhaler INHALE 2 PUFFS INTO THE LUNGS 2 (TWO) TIMES DAILY. 36 g 1   Fluticasone-Umeclidin-Vilant (TRELEGY ELLIPTA) 200-62.5-25 MCG/ACT AEPB Inhale 1 puff into the lungs daily. 60 each 5   Galcanezumab-gnlm (EMGALITY) 120 MG/ML SOAJ Inject 120 mg into the skin every 30 (thirty) days. 1 mL 11   Hypertonic Nasal Wash (SINUS RINSE REFILL) PACK Use one packet dissolved in water as needed. (Patient taking differently: Place 1 each into the nose in the morning and at bedtime.) 100 each 5   ibuprofen (ADVIL) 800 MG tablet Take 1 tablet (800 mg total) by mouth 3 (three) times daily as needed (pain.). 270 tablet 0   ipratropium (ATROVENT) 0.06 % nasal spray Place 2 sprays into both nostrils 4 (four) times daily. 45 mL 3   linaclotide (LINZESS) 145 MCG CAPS capsule Take 1 capsule (145 mcg total) by mouth daily before breakfast. 60  capsule 3   meclizine (ANTIVERT) 25 MG tablet Take 25 mg by mouth 3 (three) times daily as needed (dizziness/vertigo).      meloxicam (MOBIC) 15 MG tablet Take 1 tablet (15 mg total) by mouth daily. 30 tablet 0   MUCUS RELIEF ER 600 MG 12 hr tablet Take 600-1,200 mg by mouth 2 (two) times daily as needed (congestion).      ondansetron (ZOFRAN-ODT) 4 MG disintegrating tablet Take 1-2 tablets (4-8 mg total) by mouth every 8 (eight) hours as needed. May take with Rizatriptan 30 tablet 3   pantoprazole (PROTONIX) 40 MG tablet Take 1 tablet (40 mg total) by mouth at bedtime. 90 tablet 3   Pediatric Multiple Vit-C-FA (MULTIVITAMIN ANIMAL SHAPES, WITH CA/FA,) with C & FA chewable tablet Chew 2 tablets by mouth daily.     Rimegepant Sulfate (NURTEC) 75 MG TBDP Dissolve 1 tablet by mouth daily as needed for migraines. Take as close to onset of migraine as possible. One daily maximum. 16 tablet 11   rizatriptan (MAXALT-MLT) 10 MG disintegrating tablet Dissolve 1 tablet (10 mg total) by mouth as needed for migraine. May repeat in 2 hours if needed 27 tablet 4   simethicone (MYLICON) 80 MG chewable tablet Chew 80 mg by mouth every 6 (six) hours as needed for flatulence.     tezepelumab-ekko (TEZSPIRE) 210 MG/1.91ML syringe Inject 1.91 mLs (210 mg total) into the skin every 28 (twenty-eight) days. 1.91 mL 11   tiZANidine (ZANAFLEX) 4 MG tablet Take 1 tablet by mouth every 8  hours as needed. 10 tablet 0   triamcinolone ointment (KENALOG) 0.1 % APPLY TO AFFECTED AREA(S) 2 TIMES A  DAY 454 g 2   valACYclovir (VALTREX) 500 MG tablet Take 500 mg by mouth 2 (two) times daily.     valACYclovir (VALTREX) 500 MG tablet Take 2 tablets (1,000 mg total) by mouth 2 (two) times daily as needed for acute flares 120 tablet 0   Vibegron (GEMTESA) 75 MG TABS Take 1 tablet by mouth daily. 30 tablet 11   Current Facility-Administered Medications on File Prior to Visit  Medication Dose Route Frequency Provider Last Rate Last Admin    tezepelumab-ekko (TEZSPIRE) 210 MG/1.91ML syringe 210 mg  210 mg Subcutaneous Q28 days Valentina Shaggy, MD   210 mg at 04/22/22 1718    BP 120/60   Pulse (!) 108   Temp 98 F (36.7 C) (Oral)   Ht '5\' 6"'$  (1.676 m)   Wt 173 lb (78.5 kg)   SpO2 98%   BMI 27.92 kg/m       Objective:   Physical Exam Vitals and nursing note reviewed.  Constitutional:      Appearance: Normal appearance.  Cardiovascular:     Pulses:          Dorsalis pedis pulses are 1+ on the left side.  Musculoskeletal:        General: Normal range of motion.     Left foot: Normal range of motion. No deformity.       Feet:  Feet:     Left foot:     Skin integrity: Skin integrity normal. No erythema or warmth.  Skin:    General: Skin is warm and dry.     Capillary Refill: Capillary refill takes less than 2 seconds.  Neurological:     General: No focal deficit present.     Mental Status: She is alert and oriented to person, place, and time.  Psychiatric:        Mood and Affect: Mood normal.        Behavior: Behavior normal.        Thought Content: Thought content normal.        Judgment: Judgment normal.       Assessment & Plan:  1. Acute left ankle pain - likely arthritic features. Consider referral to orthopedics  - DG Ankle Complete Left; Future   Dorothyann Peng, NP

## 2022-05-09 NOTE — Patient Instructions (Addendum)
If you are age 49 or younger, your body mass index should be between 19-25. Your Body mass index is 27.92 kg/m. If this is out of the aformentioned range listed, please consider follow up with your Primary Care Provider.   __________________________________________________________  The [facility] providers would like to encourage you to use [facility] to communicate with providers for non-urgent requests or questions.  Due to long hold times on the telephone, sending your provider a message by [facility] may be a faster and more efficient way to get a response.  Please allow 48 business hours for a response.  Please remember that this is for non-urgent requests.   Due to recent changes in healthcare laws, you may see the results of your imaging and laboratory studies on MyChart before your provider has had a chance to review them.  We understand that in some cases there may be results that are confusing or concerning to you. Not all laboratory results come back in the same time frame and the provider may be waiting for multiple results in order to interpret others.  Please give [place] 48 hours in order for your provider to thoroughly review all the results before contacting the office for clarification of your results.   You have been scheduled for an esophageal manometry test at [facility] on 09/24/22 at 830 am. Please arrive 30 minutes prior to your procedure for registration. You will need to go to outpatient registration (1st floor of the hospital) first. Make certain to bring your insurance cards as well as a complete list of medications.  Please remember the following:  1) Do not take any muscle relaxants, xanax (alprazolam) or ativan for 1 day prior to your test as well as the day of the test.  2) Nothing to eat or drink after 12:00 midnight on the night before your test.  3) Hold all diabetic medications/insulin the morning of the test. You may eat and take your medications after the  test.  It will take at least 2 weeks to receive the results of this test from your physician.  ------------------------------------------ ABOUT ESOPHAGEAL MANOMETRY Esophageal manometry (muh-NOM-uh-tree) is a test that gauges how well your esophagus works. Your esophagus is the long, muscular tube that connects your throat to your stomach. Esophageal manometry measures the rhythmic muscle contractions (peristalsis) that occur in your esophagus when you swallow. Esophageal manometry also measures the coordination and force exerted by the muscles of your esophagus.  During esophageal manometry, a thin, flexible tube (catheter) that contains sensors is passed through your nose, down your esophagus and into your stomach. Esophageal manometry can be helpful in diagnosing some mostly uncommon disorders that affect your esophagus.  Why it's done Esophageal manometry is used to evaluate the movement (motility) of food through the esophagus and into the stomach. The test measures how well the circular bands of muscle (sphincters) at the top and bottom of your esophagus open and close, as well as the pressure, strength and pattern of the wave of esophageal muscle contractions that moves food along.  What you can expect Esophageal manometry is an outpatient procedure done without sedation. Most people tolerate it well. You may be asked to change into a hospital gown before the test starts.  During esophageal manometry  While you are sitting up, a member of your health care team sprays your throat with a numbing medication or puts numbing gel in your nose or both.  A catheter is guided through your nose into your  esophagus. The catheter may be sheathed in a water-filled sleeve. It doesn't interfere with your breathing. However, your eyes may water, and you may gag. You may have a slight nosebleed from irritation.  After the catheter is in place, you may be asked to lie on your back on an exam table, or you may be  asked to remain seated.  You then swallow small sips of water. As you do, a computer connected to the catheter records the pressure, strength and pattern of your esophageal muscle contractions.  During the test, you'll be asked to breathe slowly and smoothly, remain as still as possible, and swallow only when you're asked to do so.  A member of your health care team may move the catheter down into your stomach while the catheter continues its measurements.  The catheter then is slowly withdrawn. The test usually lasts 20 to 30 minutes.  After esophageal manometry  When your esophageal manometry is complete, you may return to your normal activities  This test typically takes 30-45 minutes to complete. ________________________________________________________________________________   Thank you for choosing me and [facility].  Vito Cirigliano, D.O.

## 2022-05-10 ENCOUNTER — Ambulatory Visit
Admission: RE | Admit: 2022-05-10 | Discharge: 2022-05-10 | Disposition: A | Discharge status: Home / Self Care | Payer: 59 | Source: Ambulatory Visit | Attending: Sports Medicine | Admitting: Sports Medicine

## 2022-05-10 ENCOUNTER — Encounter: Payer: Self-pay | Admitting: Adult Health

## 2022-05-10 DIAGNOSIS — M542 Cervicalgia: Secondary | ICD-10-CM

## 2022-05-10 DIAGNOSIS — M50223 Other cervical disc displacement at C6-C7 level: Secondary | ICD-10-CM | POA: Diagnosis not present

## 2022-05-10 DIAGNOSIS — M9902 Segmental and somatic dysfunction of thoracic region: Secondary | ICD-10-CM

## 2022-05-10 DIAGNOSIS — M9901 Segmental and somatic dysfunction of cervical region: Secondary | ICD-10-CM

## 2022-05-10 DIAGNOSIS — M9903 Segmental and somatic dysfunction of lumbar region: Secondary | ICD-10-CM

## 2022-05-10 DIAGNOSIS — M9908 Segmental and somatic dysfunction of rib cage: Secondary | ICD-10-CM

## 2022-05-10 DIAGNOSIS — M9905 Segmental and somatic dysfunction of pelvic region: Secondary | ICD-10-CM

## 2022-05-12 ENCOUNTER — Encounter: Payer: Self-pay | Admitting: Gastroenterology

## 2022-05-12 ENCOUNTER — Other Ambulatory Visit (HOSPITAL_COMMUNITY): Payer: Self-pay

## 2022-05-12 NOTE — Progress Notes (Signed)
[name] would like for patient to follow up in clinic at her earliest convenience

## 2022-05-13 ENCOUNTER — Other Ambulatory Visit: Payer: Self-pay

## 2022-05-13 DIAGNOSIS — R1319 Other dysphagia: Secondary | ICD-10-CM

## 2022-05-14 ENCOUNTER — Other Ambulatory Visit: Payer: Self-pay | Admitting: Adult Health

## 2022-05-14 DIAGNOSIS — M25572 Pain in left ankle and joints of left foot: Secondary | ICD-10-CM

## 2022-05-15 ENCOUNTER — Other Ambulatory Visit (HOSPITAL_COMMUNITY): Payer: Self-pay

## 2022-05-20 ENCOUNTER — Ambulatory Visit (INDEPENDENT_AMBULATORY_CARE_PROVIDER_SITE_OTHER): Payer: 59

## 2022-05-20 DIAGNOSIS — J4550 Severe persistent asthma, uncomplicated: Secondary | ICD-10-CM | POA: Diagnosis not present

## 2022-05-22 ENCOUNTER — Encounter: Payer: Self-pay | Admitting: *Deleted

## 2022-05-22 NOTE — Progress Notes (Signed)
Donna Park D.[facility] [place] [place] Phone: [phone]   Assessment and Plan:    1. Neck pain 2. Acute pain of left shoulder 3. Somatic dysfunction of cervical region 4. Somatic dysfunction of thoracic region 5. Somatic dysfunction of rib region -Chronic with exacerbation, subsequent visit - Continue neck pain with symptoms more consistent today with neck pain and left shoulder pain, and not neck pain with radicular symptoms. - Reviewed MRI C-spine with patient in clinic.  Mild disc herniations C4-5, C5-6, C6-7, most prominent at C6-7 without spinal cord impingement - Patient elected for repeat trigger point injections.  Previous trigger point injections included Kenalog and lidocaine on 03/28/2022.  Due to brief period between injections, we will only perform injections with lidocaine at today's visit.  Tolerated well per note below  Trigger Point Injection: After informed consent was obtained, skin cleaned with alcohol  prep.  A total of 3 trigger points identified along left trapezius.  Injections given over area of pain for total injection of 3 ml lidocaine 1% w/o epi.  Patient had relief after the injection without side effects.  Pt given signs of infection to watch for.   - Patient has received significant relief with OMT in the past.  Elects for repeat OMT today.  Tolerated well per note below. - Decision today to treat with OMT was based on Physical Exam  After verbal consent patient was treated with HVLA (high velocity low amplitude), ME (muscle energy), FPR (flex positional release), ST (soft tissue), PC/PD (Pelvic Compression/ Pelvic Decompression) techniques in cervical, rib, thoracic,   areas. Patient tolerated the procedure well with improvement in symptoms.  Patient educated on potential side effects of soreness and recommended to rest, hydrate, and use Tylenol as needed for pain control.    Pertinent previous  records reviewed include MRI C-spine 05/10/2022   Follow Up: 4 weeks for reevaluation, repeat OMT, could consider repeat trigger point injections, review of physical therapy has benefit for neck and shoulder   Subjective:   I, Donna Park, am serving as a scribe for [name]  Chief Complaint: neck and shoulder pain    HPI:  01/31/2022 Patient is a 51 year old female complaining of neck and shoulder pain. Patient states been going on for about 2 weeks has knots in her shoulders both neck and shoulders have been stiff , interested in OMT , knots in her upper trap , low back and her sciatic nerve on her right side flares up when she's having intercourse goes down her leg and upper her back    02/21/2022 Patient states that her neck and her shoulders are still bothering her after her adjustment she was very tight is still tight in the upper back , still wants the adjustment will get a massage later tonight as well  , hip still hurts , her tailbone she woke up one morning with pain     03/06/2022 Patient states still has upper trap tightness, finder her self leaning on that left side    03/28/2022 Patient states that she is still tight time for an adjustment would like to discuss a lower spot for CSI    04/25/2022 Patient states that her neck is heavy, would like a referral for MRI, still neck tightness and pain,  05/23/2022 Patient states she is ready for the OMT but the left shoulder clavicle have been sore, neck and shoulder still hurts. Patient has had the  MRI of the ankle this morning. Patient wanting to know if she can get another shoulder injection today      Relevant Historical Information: History of migraines being treated with Botox injections  Additional pertinent review of systems negative.   Current Outpatient Medications:    acetaminophen (TYLENOL) 500 MG tablet, Take by mouth., Disp: , Rfl:    acyclovir ointment (ZOVIRAX) 5 %, Apply onto the affected area every 3  hours., Disp: 15 g, Rfl: 1   albuterol (VENTOLIN HFA) 108 (90 Base) MCG/ACT inhaler, Inhale 1 - 2 puffs into the lungs every 6 hours as needed for wheezing or shortness of breath., Disp: 54 g, Rfl: 0   amitriptyline (ELAVIL) 10 MG tablet, Take 1 tablet (10 mg total) by mouth at bedtime., Disp: 90 tablet, Rfl: 1   azelastine (ASTELIN) 0.1 % nasal spray, Place 2 sprays into both nostrils 2 (two) times daily., Disp: 30 mL, Rfl: 5   Azelastine-Fluticasone (DYMISTA) 137-50 MCG/ACT SUSP, Place 2 sprays into both nostrils 2 (two) times daily as needed., Disp: 23 g, Rfl: 5   CALCIUM PO, Take 1 tablet by mouth 4 (four) times a week., Disp: , Rfl:    cetirizine (ZYRTEC) 10 MG tablet, Take 2 tablets (20 mg total) by mouth 2 (two) times daily., Disp: 360 tablet, Rfl: 1   Cyanocobalamin (NASCOBAL NA), Place 1 spray into the nose every Sunday., Disp: , Rfl:    diazepam (VALIUM) 10 MG tablet, Take 1 tablet (10 mg total) by mouth at bedtime as needed for sleep., Disp: 30 tablet, Rfl: 2   EPINEPHrine 0.3 mg/0.3 mL IJ SOAJ injection, Inject 0.3 mg into the muscle as needed for anaphylaxis., Disp: 2 each, Rfl: 1   estradiol (ESTRACE) 2 MG tablet, Take 1 tablet (2 mg total) by mouth daily., Disp: 90 tablet, Rfl: 12   estradiol (ESTRACE) 2 MG tablet, Take 1 tablet (2 mg total) by mouth daily., Disp: 30 tablet, Rfl: 12   fluconazole (DIFLUCAN) 150 MG tablet, Take 1 tablet (150 mg total) by mouth daily., Disp: 14 tablet, Rfl: 2   fluticasone (FLONASE) 50 MCG/ACT nasal spray, Place 2 sprays into both nostrils daily., Disp: 16 g, Rfl: 5   Fluticasone-Umeclidin-Vilant (TRELEGY ELLIPTA) 200-62.5-25 MCG/ACT AEPB, Inhale 1 puff into the lungs daily., Disp: 60 each, Rfl: 5   Galcanezumab-gnlm (EMGALITY) 120 MG/ML SOAJ, Inject 120 mg into the skin every 30 (thirty) days., Disp: 1 mL, Rfl: 11   Hypertonic Nasal Wash (SINUS RINSE REFILL) PACK, Use one packet dissolved in water as needed. (Patient taking differently: Place 1 each  into the nose in the morning and at bedtime.), Disp: 100 each, Rfl: 5   ibuprofen (ADVIL) 800 MG tablet, Take 1 tablet (800 mg total) by mouth 3 (three) times daily as needed (pain.)., Disp: 270 tablet, Rfl: 0   ipratropium (ATROVENT) 0.06 % nasal spray, Place 2 sprays into both nostrils 4 (four) times daily., Disp: 45 mL, Rfl: 3   linaclotide (LINZESS) 145 MCG CAPS capsule, Take 1 capsule (145 mcg total) by mouth daily before breakfast., Disp: 60 capsule, Rfl: 3   meclizine (ANTIVERT) 25 MG tablet, Take 25 mg by mouth 3 (three) times daily as needed (dizziness/vertigo). , Disp: , Rfl:    meloxicam (MOBIC) 15 MG tablet, Take 1 tablet (15 mg total) by mouth daily., Disp: 30 tablet, Rfl: 0   MUCUS RELIEF ER 600 MG 12 hr tablet, Take 600-1,200 mg by mouth 2 (two) times daily as needed (congestion). , Disp: ,  Rfl:    ondansetron (ZOFRAN-ODT) 4 MG disintegrating tablet, Take 1-2 tablets (4-8 mg total) by mouth every 8 (eight) hours as needed. May take with Rizatriptan, Disp: 30 tablet, Rfl: 3   pantoprazole (PROTONIX) 40 MG tablet, Take 1 tablet (40 mg total) by mouth at bedtime., Disp: 90 tablet, Rfl: 3   Pediatric Multiple Vit-C-FA (MULTIVITAMIN ANIMAL SHAPES, WITH CA/FA,) with C & FA chewable tablet, Chew 2 tablets by mouth daily., Disp: , Rfl:    Rimegepant Sulfate (NURTEC) 75 MG TBDP, Dissolve 1 tablet by mouth daily as needed for migraines. Take as close to onset of migraine as possible. One daily maximum., Disp: 16 tablet, Rfl: 11   rizatriptan (MAXALT-MLT) 10 MG disintegrating tablet, Dissolve 1 tablet (10 mg total) by mouth as needed for migraine. May repeat in 2 hours if needed, Disp: 27 tablet, Rfl: 4   simethicone (MYLICON) 80 MG chewable tablet, Chew 80 mg by mouth every 6 (six) hours as needed for flatulence., Disp: , Rfl:    tezepelumab-ekko (TEZSPIRE) 210 MG/1.91ML syringe, Inject 1.91 mLs (210 mg total) into the skin every 28 (twenty-eight) days., Disp: 1.91 mL, Rfl: 11   tiZANidine  (ZANAFLEX) 4 MG tablet, Take 1 tablet by mouth every 8  hours as needed., Disp: 10 tablet, Rfl: 0   triamcinolone ointment (KENALOG) 0.1 %, APPLY TO AFFECTED AREA(S) 2 TIMES A DAY, Disp: 454 g, Rfl: 2   valACYclovir (VALTREX) 500 MG tablet, Take 500 mg by mouth 2 (two) times daily., Disp: , Rfl:    valACYclovir (VALTREX) 500 MG tablet, Take 2 tablets (1,000 mg total) by mouth 2 (two) times daily as needed for acute flares, Disp: 120 tablet, Rfl: 0   Vibegron (GEMTESA) 75 MG TABS, Take 1 tablet by mouth daily., Disp: 30 tablet, Rfl: 11   Vibegron (GEMTESA) 75 MG TABS, Take 1 tablet by mouth daily., Disp: 90 tablet, Rfl: 4   fluticasone-salmeterol (ADVAIR HFA) 230-21 MCG/ACT inhaler, INHALE 2 PUFFS INTO THE LUNGS 2 (TWO) TIMES DAILY., Disp: 36 g, Rfl: 1  Current Facility-Administered Medications:    tezepelumab-ekko (TEZSPIRE) 210 MG/1.91ML syringe 210 mg, 210 mg, Subcutaneous, Q28 days, Valentina Shaggy, MD, 210 mg at 05/20/22 1745   Objective:     Vitals:   05/23/22 0921  BP: 104/70  Pulse: 75  SpO2: 95%  Weight: 170 lb (77.1 kg)  Height: '5\' 6"'$  (1.676 m)      Body mass index is 27.44 kg/m.    Physical Exam:    General: Well-appearing, cooperative, sitting comfortably in no acute distress.   OMT Physical Exam:  ASIS Compression Test: Positive Right Cervical: TTP paraspinal, C4 RL SR Rib: Bilateral elevated first rib with TTP, worse on left Thoracic: TTP paraspinal, T4 RRSR  TTP left-sided cervical paraspinal, left-sided trapezius   Electronically signed by:  Donna Park D.[facility] 9:56 AM 05/23/22

## 2022-05-23 ENCOUNTER — Ambulatory Visit
Admission: RE | Admit: 2022-05-23 | Discharge: 2022-05-23 | Disposition: A | Discharge status: Home / Self Care | Payer: 59 | Source: Ambulatory Visit | Attending: Adult Health | Admitting: Adult Health

## 2022-05-23 ENCOUNTER — Ambulatory Visit (INDEPENDENT_AMBULATORY_CARE_PROVIDER_SITE_OTHER): Payer: 59 | Admitting: Sports Medicine

## 2022-05-23 VITALS — BP 104/70 | HR 75 | Ht 66.0 in | Wt 170.0 lb

## 2022-05-23 DIAGNOSIS — M9908 Segmental and somatic dysfunction of rib cage: Secondary | ICD-10-CM

## 2022-05-23 DIAGNOSIS — M9902 Segmental and somatic dysfunction of thoracic region: Secondary | ICD-10-CM | POA: Diagnosis not present

## 2022-05-23 DIAGNOSIS — M25512 Pain in left shoulder: Secondary | ICD-10-CM | POA: Diagnosis not present

## 2022-05-23 DIAGNOSIS — M542 Cervicalgia: Secondary | ICD-10-CM

## 2022-05-23 DIAGNOSIS — M9901 Segmental and somatic dysfunction of cervical region: Secondary | ICD-10-CM | POA: Diagnosis not present

## 2022-05-23 DIAGNOSIS — R600 Localized edema: Secondary | ICD-10-CM | POA: Diagnosis not present

## 2022-05-23 DIAGNOSIS — M25572 Pain in left ankle and joints of left foot: Secondary | ICD-10-CM

## 2022-05-23 NOTE — Patient Instructions (Addendum)
Good to see you  Trigger point injection given in shoulder today PT placed for neck shoulder and trap Left shoulder exercises given  Follow up in 4 weeks

## 2022-05-29 ENCOUNTER — Telehealth: Payer: Self-pay

## 2022-05-29 NOTE — Telephone Encounter (Signed)
Called patient, advised insurance denied authorization due to abdomen does not hang over panus along with weight has not been stable for the past 6 months. A quote for abdominoplasty with liposuction to BL thighs was provided on 6/15.  Patient does not want to pay for having cosmetic surgery

## 2022-06-03 ENCOUNTER — Other Ambulatory Visit: Payer: Self-pay | Admitting: Adult Health

## 2022-06-03 ENCOUNTER — Other Ambulatory Visit: Payer: Self-pay

## 2022-06-03 ENCOUNTER — Other Ambulatory Visit (HOSPITAL_COMMUNITY): Payer: Self-pay

## 2022-06-03 ENCOUNTER — Encounter: Payer: Self-pay | Admitting: Neurology

## 2022-06-03 MED ORDER — AMITRIPTYLINE HCL 10 MG PO TABS
10.0000 mg | ORAL_TABLET | Freq: Every day | ORAL | 1 refills | Status: DC
  Filled 2022-06-03: qty 90, 90d supply, fill #0
  Filled 2022-09-26: qty 90, 90d supply, fill #1

## 2022-06-03 NOTE — Telephone Encounter (Signed)
Amitriptyline 10 mg refill sent to pharmacy per Amy NP.

## 2022-06-06 ENCOUNTER — Other Ambulatory Visit (HOSPITAL_COMMUNITY): Payer: Self-pay

## 2022-06-06 MED ORDER — VALACYCLOVIR HCL 500 MG PO TABS
1000.0000 mg | ORAL_TABLET | Freq: Two times a day (BID) | ORAL | 1 refills | Status: DC
Start: 2022-06-06 — End: 2022-09-16
  Filled 2022-06-06 – 2022-06-16 (×2): qty 120, 30d supply, fill #0
  Filled 2022-07-26: qty 120, 30d supply, fill #1

## 2022-06-07 ENCOUNTER — Ambulatory Visit: Payer: 59 | Attending: Sports Medicine | Admitting: Rehabilitative and Restorative Service Providers"

## 2022-06-07 DIAGNOSIS — M9908 Segmental and somatic dysfunction of rib cage: Secondary | ICD-10-CM | POA: Insufficient documentation

## 2022-06-07 DIAGNOSIS — M9902 Segmental and somatic dysfunction of thoracic region: Secondary | ICD-10-CM | POA: Diagnosis not present

## 2022-06-07 DIAGNOSIS — M9901 Segmental and somatic dysfunction of cervical region: Secondary | ICD-10-CM | POA: Insufficient documentation

## 2022-06-07 DIAGNOSIS — M25512 Pain in left shoulder: Secondary | ICD-10-CM | POA: Insufficient documentation

## 2022-06-07 DIAGNOSIS — R293 Abnormal posture: Secondary | ICD-10-CM | POA: Insufficient documentation

## 2022-06-07 DIAGNOSIS — M542 Cervicalgia: Secondary | ICD-10-CM | POA: Insufficient documentation

## 2022-06-07 NOTE — Therapy (Signed)
[place], [place], [address] Phone: [phone]   Fax:  [phone]  Patient Details  Name: Donna Park MRN: [id number] Date of Birth: 20-Jan-1971 Referring Provider:  Glennon Mac, DO  Encounter Date: 06/07/2022   OUTPATIENT PHYSICAL THERAPY CERVICAL EVALUATION   Patient Name: Donna Park MRN: [id number] DOB:Apr 11, 1971, 51 y.o., female Today's Date: 06/07/2022   PT End of Session - 06/07/22 1211     Visit Number 1    Number of Visits 10    Date for PT Re-Evaluation 07/19/22    Authorization Type UMR    Progress Note Due on Visit 10    PT Start Time 0803    PT Stop Time 0904    PT Time Calculation (min) 61 min    Activity Tolerance Patient tolerated treatment well;No increased pain    Behavior During Therapy WFL for tasks assessed/performed             Past Medical History:  Diagnosis Date   Allergy    Anxiety    Arthritis    Asthma    Chicken pox    Complication of anesthesia    slow to wake up from anesthesia   Depression    DJD (degenerative joint disease)    GERD (gastroesophageal reflux disease)    resolved after gastric surgery   H/O gastric bypass 2016   History of iron deficiency anemia    HSV infection    Migraines    Obese    Pneumonia    Childhood history   PONV (postoperative nausea and vomiting)    Status post dilation of esophageal narrowing    Varicose vein of leg    Past Surgical History:  Procedure Laterality Date   ANKLE FRACTURE SURGERY Left 03/08/2009   BREAST SURGERY Bilateral    Cyst Removal   GASTRIC BYPASS  06/18/2015   LAPAROSCOPIC VAGINAL HYSTERECTOMY WITH SALPINGECTOMY Bilateral 05/15/2020   Procedure: LAPAROSCOPIC ASSISTED VAGINAL HYSTERECTOMY WITH SALPINGECTOMY;  Surgeon: Louretta Shorten, MD;  Location: [facility];  Service: Gynecology;  Laterality: Bilateral;  need bed   LASER ABLATION     Varicose veins   TONSILLECTOMY  AND ADENOIDECTOMY  2001   TUBAL LIGATION  12/19/1999   UPPER GASTROINTESTINAL ENDOSCOPY     UPPER GI ENDOSCOPY     Uterine ablation  12/2018   Patient Active Problem List   Diagnosis Date Noted   LUQ pain 03/01/2022   Change in bowel habits 03/01/2022   Loss of weight 03/01/2022   Chronic migraine without aura without status migrainosus, not intractable 06/14/2021   S/P laparoscopic assisted vaginal hysterectomy (LAVH) 05/15/2020   Migraines    GERD (gastroesophageal reflux disease)    Asthma    Seasonal and perennial allergic rhinitis 02/10/2019   Moderate persistent asthma, uncomplicated 51/76/1607   Anaphylactic shock due to adverse food reaction 02/10/2019   Atopic dermatitis 02/10/2019   History of Roux-en-Y gastric bypass 2016    PCP: Dorothyann Peng, NP  REFERRING PROVIDER: Glennon Mac, DO  REFERRING DIAG:  M54.2 (ICD-10-CM) - Neck pain  M99.01 (ICD-10-CM) - Somatic dysfunction of cervical region  M99.02 (ICD-10-CM) - Somatic dysfunction of thoracic region  M99.08 (ICD-10-CM) - Somatic dysfunction of rib region  M25.512 (ICD-10-CM) - Acute pain of left shoulder    THERAPY DIAG:  Cervicalgia  Abnormal posture  Rationale for Evaluation and Treatment Rehabilitation  ONSET DATE: a while ago  SUBJECTIVE:  SUBJECTIVE STATEMENT: The knot doesn't want to go away in the left shoulder (in the front). It has been there a couple of months and just got tight one day. Find myself leaning to the left in my chair at work. R handed but uses L hand for a lot of activities  PERTINENT HISTORY:  repeat trigger point injections and has received OMT treatments with last being 05/23/22 by Glennon Mac, DO.   PAIN:  Are you having pain? No  PRECAUTIONS: None  WEIGHT BEARING  RESTRICTIONS No  FALLS:  Has patient fallen in last 6 months? 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 [employer]  PLOF: [place] to be painfree  OBJECTIVE:   DIAGNOSTIC FINDINGS:  05/10/22 MRI: Mild disc herniations C4-5, C5-6, C6-7, most prominent at C6-7 without spinal cord impingement  PATIENT SURVEYS:  FOTO 40% function   COGNITION: Overall cognitive status: Within functional limits for tasks assessed   SENSATION: WFL  POSTURE:  sits with left lateral trunk lean, R shoulder heigher, R scapula lower, rounded shoulders bil, slight forward head, increased lumbar lordosis, L clavicle higher, L clavicular notch heigher, audible pop under L acromion   PALPATION: T2, T4-5, T7-8 hypomobile, pt with TTP along L Upper Trap,mid Trap, Teres; see posture section, coracoid process/acromion heigher left  CERVICAL ROM:   Active ROM A/PROM (deg) eval  Flexion WFL  Extension WFL  Right lateral flexion WFL  Left lateral flexion WFL  Right rotation WFL  Left rotation WFL   (Blank rows = not tested)  UPPER EXTREMITY MMT:  MMT Right eval Left eval  Shoulder flexion 4+/5 3+/5 with pain   Shoulder extension    Shoulder abduction 4+/5 3+/5  Shoulder adduction 4+/5 3+/5  Shoulder extension    Shoulder internal rotation 4+/5 3+/5  Shoulder external rotation 4+/5 3+/5  Elbow flexion    Elbow extension    Wrist flexion    Wrist extension    Wrist ulnar deviation    Wrist radial deviation    Wrist pronation    Wrist supination     (Blank rows = not tested) mid-Trap,Teres 2-/5  UPPER EXTREMITY AROM:  MMT Right eval Left eval  Shoulder flexion [facility] WFL with pain  Shoulder extension    Shoulder abduction [facility] WFL  Shoulder adduction [facility]  Shoulder extension    Shoulder internal rotation [facility] WFL  Shoulder external rotation WFL WFL                                                (Blank rows = not tested)  CERVICAL SPECIAL TESTS:  NT   TODAY'S TREATMENT:  At eval: see pt education section   PATIENT EDUCATION:  Education details: HEP, postural education, anatomy review Person educated: Patient Education method: Explanation, Demonstration, and Handouts Education comprehension: verbalized understanding and returned demonstration   HOME EXERCISE PROGRAM: Access Code: [email or web address] URL: [email or web address] Date: 06/07/2022 Prepared by: America Brown  Exercises - Standing Scapular Depression  - 2 x daily - 7 x weekly - 1 sets - 15 reps - Seated Scapular Retraction  - 2 x daily - 7 x weekly - 1 sets - 15 reps - Latissimus Dorsi Stretch at Wall  - 2 x daily - 7 x weekly - 1  sets - 2 reps - 30 sec hold  ASSESSMENT:  CLINICAL IMPRESSION: Patient is a 51 y.o. female who was seen today for physical therapy evaluation and treatment for cervical tightness/pain bil L > R and L shoulder pain. Pt has bony asymmetries L shoulder and palpable tightness along L Upper/Mid Trap/Rhomboids. Pt has rounded posture and demos L shoulder weakness. Pt would benefit from PT for L cervical/shoulder/scapular flexibility therex and manual therapy and correction of muscle imbalance and bony asymmetry.  She would also benefit from dry needling and thoracic mobility/manual therapy activities. Pt is also under the care of a massage therapist and DO; PT recommended she not have any adjustments by DO while undergoing PT treatment at this time.    OBJECTIVE IMPAIRMENTS decreased activity tolerance, decreased coordination, decreased mobility, decreased ROM, decreased strength, hypomobility, increased muscle spasms, impaired flexibility, impaired UE functional use, postural dysfunction, and pain.   ACTIVITY LIMITATIONS carrying, reach over head, and locomotion level  PARTICIPATION LIMITATIONS: meal prep, community activity, and occupation  [place] are also affecting patient's functional outcome.   REHAB POTENTIAL: Excellent  CLINICAL DECISION MAKING: Stable/uncomplicated  EVALUATION COMPLEXITY: Low   GOALS: Goals reviewed with patient? Yes  SHORT TERM GOALS: Target date: 07/05/2022   Pt will be indep with initial HEP to assist with pain management Baseline: initiated at eval Goal status: INITIAL  2.  Pt will report 25% improvement in L shoulder symptoms with functional activities at work Baseline: 100% present Goal status: INITIAL   LONG TERM GOALS: Target date:  07/19/22  Pt will be able to wipe down the scales with a circular motion at work without pain Baseline: 8/10 Goal status: INITIAL  2.  Pt will be able to perform left lateral cervical sidebend at work without pain Baseline: 10/10 Goal status: INITIAL  3.  Pt will be able to mop/sweep without pain Baseline: 6/10 Goal status: INITIAL  4.  Pt will have improved FOTO score to >/=57% function Baseline: 40% function Goal status: INITIAL  5.  Pt will demo improvement in L shoulder strength to >/= 4+/5 to assist with work duties Baseline: 3+/5 throughout except mid trap/teres 2-/5 Goal status: INITIAL     PLAN: PT FREQUENCY: 2x/week  PT DURATION: other: 6 weeks  PLANNED INTERVENTIONS: Therapeutic exercises, Therapeutic activity, Neuromuscular re-education, Patient/Family education, Joint mobilization, Dry Needling, Moist heat, Taping, Ultrasound, Manual therapy, and Re-evaluation  PLAN FOR NEXT SESSION: review HEP, L clavicular/scapular mobs, manual therapy for L Upper Trap/Teres/Mid trap tightness; strengthening L shoulder, dry needling   America Brown, PT 06/07/2022, 12:13 PM       America Brown, PT 06/07/2022, 12:13 PM  [facility] [address] [place], [place], [address] Phone: [phone]   Fax:  [phone]

## 2022-06-09 ENCOUNTER — Other Ambulatory Visit (HOSPITAL_COMMUNITY): Payer: Self-pay

## 2022-06-11 ENCOUNTER — Other Ambulatory Visit (HOSPITAL_COMMUNITY): Payer: Self-pay

## 2022-06-13 DIAGNOSIS — Z760 Encounter for issue of repeat prescription: Secondary | ICD-10-CM | POA: Diagnosis not present

## 2022-06-16 ENCOUNTER — Other Ambulatory Visit (HOSPITAL_COMMUNITY): Payer: Self-pay

## 2022-06-17 ENCOUNTER — Ambulatory Visit (INDEPENDENT_AMBULATORY_CARE_PROVIDER_SITE_OTHER): Payer: 59

## 2022-06-17 DIAGNOSIS — J4550 Severe persistent asthma, uncomplicated: Secondary | ICD-10-CM | POA: Diagnosis not present

## 2022-06-19 NOTE — Progress Notes (Signed)
[name] D.[place] [facility] [place] Phone: [phone]   Assessment and Plan:     1. Neck pain 2. Chronic right-sided low back pain without sciatica 3. Somatic dysfunction of cervical region 4. Somatic dysfunction of thoracic region 5. Somatic dysfunction of lumbar region 6. Somatic dysfunction of pelvic region 7. Somatic dysfunction of rib region -Chronic with exacerbation, subsequent sports medicine visit - Recurrence of multiple musculoskeletal complaints with most prominent being right lower back and right upper back/neck - Continue with physical therapy.  Patient plans on starting dry needling and more consistent therapy over the next several weeks - Patient has received significant relief with OMT in the past.  Elects for repeat OMT today.  Tolerated well per note below. - Decision today to treat with OMT was based on Physical Exam   After verbal consent patient was treated with HVLA (high velocity low amplitude), ME (muscle energy), FPR (flex positional release), ST (soft tissue), PC/PD (Pelvic Compression/ Pelvic Decompression) techniques in cervical, rib, thoracic, lumbar, and pelvic areas. Patient tolerated the procedure well with improvement in symptoms.  Patient educated on potential side effects of soreness and recommended to rest, hydrate, and use Tylenol as needed for pain control.   Pertinent previous records reviewed include none   Follow Up: As needed in 2 months after completion of therapy to review symptoms   Subjective:   I, [name], am serving as a Education administrator for Doctor Glennon Mac  Chief Complaint: neck and shoulder pain    HPI:  01/31/2022 Patient is a 51 year old female complaining of neck and shoulder pain. Patient states been going on for about 2 weeks has knots in her shoulders both neck and shoulders have been stiff , interested in OMT , knots in her upper trap , low back and her sciatic nerve  on her right side flares up when she's having intercourse goes down her leg and upper her back    02/21/2022 Patient states that her neck and her shoulders are still bothering her after her adjustment she was very tight is still tight in the upper back , still wants the adjustment will get a massage later tonight as well  , hip still hurts , her tailbone she woke up one morning with pain     03/06/2022 Patient states still has upper trap tightness, finder her self leaning on that left side    03/28/2022 Patient states that she is still tight time for an adjustment would like to discuss a lower spot for CSI    04/25/2022 Patient states that her neck is heavy, would like a referral for MRI, still neck tightness and pain,   05/23/2022 Patient states she is ready for the OMT but the left shoulder clavicle have been sore, neck and shoulder still hurts. Patient has had the MRI of the ankle this morning. Patient wanting to know if she can get another shoulder injection today   06/20/2022 Patient states that she is a little better decreased ROM but is going to PT , ankle feels better bought different shoes     Relevant Historical Information: History of migraines being treated with Botox injections  Additional pertinent review of systems negative.  Current Outpatient Medications  Medication Sig Dispense Refill   acetaminophen (TYLENOL) 500 MG tablet Take by mouth.     acyclovir ointment (ZOVIRAX) 5 % Apply onto the affected area every 3 hours. 15 g 1   albuterol (VENTOLIN  HFA) 108 (90 Base) MCG/ACT inhaler Inhale 1 - 2 puffs into the lungs every 6 hours as needed for wheezing or shortness of breath. 54 g 0   amitriptyline (ELAVIL) 10 MG tablet Take 1 tablet (10 mg total) by mouth at bedtime. 90 tablet 1   azelastine (ASTELIN) 0.1 % nasal spray Place 2 sprays into both nostrils 2 (two) times daily. 30 mL 5   Azelastine-Fluticasone (DYMISTA) 137-50 MCG/ACT SUSP Place 2 sprays into both nostrils 2  (two) times daily as needed. 23 g 5   CALCIUM PO Take 1 tablet by mouth 4 (four) times a week.     cetirizine (ZYRTEC) 10 MG tablet Take 2 tablets (20 mg total) by mouth 2 (two) times daily. 360 tablet 1   Cyanocobalamin (NASCOBAL NA) Place 1 spray into the nose every Sunday.     diazepam (VALIUM) 10 MG tablet Take 1 tablet (10 mg total) by mouth at bedtime as needed for sleep. 30 tablet 2   EPINEPHrine 0.3 mg/0.3 mL IJ SOAJ injection Inject 0.3 mg into the muscle as needed for anaphylaxis. 2 each 1   estradiol (ESTRACE) 2 MG tablet Take 1 tablet (2 mg total) by mouth daily. 90 tablet 12   estradiol (ESTRACE) 2 MG tablet Take 1 tablet (2 mg total) by mouth daily. 30 tablet 12   fluconazole (DIFLUCAN) 150 MG tablet Take 1 tablet (150 mg total) by mouth daily. 14 tablet 2   fluticasone (FLONASE) 50 MCG/ACT nasal spray Place 2 sprays into both nostrils daily. 16 g 5   Fluticasone-Umeclidin-Vilant (TRELEGY ELLIPTA) 200-62.5-25 MCG/ACT AEPB Inhale 1 puff into the lungs daily. 60 each 5   Galcanezumab-gnlm (EMGALITY) 120 MG/ML SOAJ Inject 120 mg into the skin every 30 (thirty) days. 1 mL 11   Hypertonic Nasal Wash (SINUS RINSE REFILL) PACK Use one packet dissolved in water as needed. (Patient taking differently: Place 1 each into the nose in the morning and at bedtime.) 100 each 5   ibuprofen (ADVIL) 800 MG tablet Take 1 tablet (800 mg total) by mouth 3 (three) times daily as needed (pain.). 270 tablet 0   ipratropium (ATROVENT) 0.06 % nasal spray Place 2 sprays into both nostrils 4 (four) times daily. 45 mL 3   linaclotide (LINZESS) 145 MCG CAPS capsule Take 1 capsule (145 mcg total) by mouth daily before breakfast. 60 capsule 3   meclizine (ANTIVERT) 25 MG tablet Take 25 mg by mouth 3 (three) times daily as needed (dizziness/vertigo).      meloxicam (MOBIC) 15 MG tablet Take 1 tablet (15 mg total) by mouth daily. 30 tablet 0   MUCUS RELIEF ER 600 MG 12 hr tablet Take 600-1,200 mg by mouth 2 (two) times  daily as needed (congestion).      ondansetron (ZOFRAN-ODT) 4 MG disintegrating tablet Take 1-2 tablets (4-8 mg total) by mouth every 8 (eight) hours as needed. May take with Rizatriptan 30 tablet 3   pantoprazole (PROTONIX) 40 MG tablet Take 1 tablet (40 mg total) by mouth at bedtime. 90 tablet 3   Pediatric Multiple Vit-C-FA (MULTIVITAMIN ANIMAL SHAPES, WITH CA/FA,) with C & FA chewable tablet Chew 2 tablets by mouth daily.     Rimegepant Sulfate (NURTEC) 75 MG TBDP Dissolve 1 tablet by mouth daily as needed for migraines. Take as close to onset of migraine as possible. One daily maximum. 16 tablet 11   rizatriptan (MAXALT-MLT) 10 MG disintegrating tablet Dissolve 1 tablet (10 mg total) by mouth as needed for migraine.  May repeat in 2 hours if needed 27 tablet 4   simethicone (MYLICON) 80 MG chewable tablet Chew 80 mg by mouth every 6 (six) hours as needed for flatulence.     tezepelumab-ekko (TEZSPIRE) 210 MG/1.91ML syringe Inject 1.91 mLs (210 mg total) into the skin every 28 (twenty-eight) days. 1.91 mL 11   tiZANidine (ZANAFLEX) 4 MG tablet Take 1 tablet by mouth every 8  hours as needed. 10 tablet 0   triamcinolone ointment (KENALOG) 0.1 % APPLY TO AFFECTED AREA(S) 2 TIMES A DAY 454 g 2   valACYclovir (VALTREX) 500 MG tablet Take 500 mg by mouth 2 (two) times daily.     valACYclovir (VALTREX) 500 MG tablet Take 2 tablets (1,000 mg total) by mouth 2 (two) times daily as needed for acute flares 120 tablet 1   Vibegron (GEMTESA) 75 MG TABS Take 1 tablet by mouth daily. 30 tablet 11   Vibegron (GEMTESA) 75 MG TABS Take 1 tablet by mouth daily. 90 tablet 4   fluticasone-salmeterol (ADVAIR HFA) 230-21 MCG/ACT inhaler INHALE 2 PUFFS INTO THE LUNGS 2 (TWO) TIMES DAILY. 36 g 1   Current Facility-Administered Medications  Medication Dose Route Frequency Provider Last Rate Last Admin   tezepelumab-ekko (TEZSPIRE) 210 MG/1.91ML syringe 210 mg  210 mg Subcutaneous Q28 days Valentina Shaggy, MD   210  mg at 06/17/22 1728      Objective:     Vitals:   06/20/22 1003  BP: 130/82  Pulse: 83  SpO2: 100%  Weight: 170 lb (77.1 kg)  Height: '5\' 6"'$  (1.676 m)      Body mass index is 27.44 kg/m.    Physical Exam:     General: Well-appearing, cooperative, sitting comfortably in no acute distress.   OMT Physical Exam:  ASIS Compression Test: Positive Right Cervical: TTP paraspinal, C3 RRSL, C5-7 RL SL Rib: Bilateral elevated first rib with TTP, worse on left Thoracic: TTP paraspinal, T4-7 RRSL Lumbar: TTP paraspinal, L1-3 RRSL Pelvis: Right anterior innominate  Electronically signed by:  [name] D.[facility] 10:53 AM 06/20/22

## 2022-06-20 ENCOUNTER — Ambulatory Visit (INDEPENDENT_AMBULATORY_CARE_PROVIDER_SITE_OTHER): Payer: 59 | Admitting: Sports Medicine

## 2022-06-20 ENCOUNTER — Ambulatory Visit: Payer: 59 | Admitting: Physical Therapy

## 2022-06-20 ENCOUNTER — Encounter: Payer: Self-pay | Admitting: Physical Therapy

## 2022-06-20 VITALS — BP 130/82 | HR 83 | Ht 66.0 in | Wt 170.0 lb

## 2022-06-20 DIAGNOSIS — M542 Cervicalgia: Secondary | ICD-10-CM

## 2022-06-20 DIAGNOSIS — M545 Low back pain, unspecified: Secondary | ICD-10-CM

## 2022-06-20 DIAGNOSIS — G8929 Other chronic pain: Secondary | ICD-10-CM | POA: Diagnosis not present

## 2022-06-20 DIAGNOSIS — M9903 Segmental and somatic dysfunction of lumbar region: Secondary | ICD-10-CM

## 2022-06-20 DIAGNOSIS — M9905 Segmental and somatic dysfunction of pelvic region: Secondary | ICD-10-CM

## 2022-06-20 DIAGNOSIS — M9902 Segmental and somatic dysfunction of thoracic region: Secondary | ICD-10-CM | POA: Diagnosis not present

## 2022-06-20 DIAGNOSIS — M9901 Segmental and somatic dysfunction of cervical region: Secondary | ICD-10-CM | POA: Diagnosis not present

## 2022-06-20 DIAGNOSIS — M9908 Segmental and somatic dysfunction of rib cage: Secondary | ICD-10-CM | POA: Diagnosis not present

## 2022-06-20 DIAGNOSIS — R293 Abnormal posture: Secondary | ICD-10-CM

## 2022-06-20 DIAGNOSIS — M25512 Pain in left shoulder: Secondary | ICD-10-CM | POA: Diagnosis not present

## 2022-06-20 NOTE — Therapy (Signed)
OUTPATIENT PHYSICAL THERAPY TREATMENT NOTE   Patient Name: Donna Park MRN: [id number] DOB:[birth year], 51 y.o., female Today's Date: 06/20/2022  PCP: Dorothyann Peng REFERRING PROVIDER: [name]   PT End of Session - 06/20/22 1211     Visit Number 2    Number of Visits 10    Date for PT Re-Evaluation 07/19/22    Authorization Type UMR    Progress Note Due on Visit 10    PT Start Time 1215    PT Stop Time 1257    PT Time Calculation (min) 42 min    Activity Tolerance Patient tolerated treatment well;No increased pain    Behavior During Therapy WFL for tasks assessed/performed             Past Medical History:  Diagnosis Date   Allergy    Anxiety    Arthritis    Asthma    Chicken pox    Complication of anesthesia    slow to wake up from anesthesia   Depression    DJD (degenerative joint disease)    GERD (gastroesophageal reflux disease)    resolved after gastric surgery   H/O gastric bypass 2016   History of iron deficiency anemia    HSV infection    Migraines    Obese    Pneumonia    Childhood history   PONV (postoperative nausea and vomiting)    Status post dilation of esophageal narrowing    Varicose vein of leg    Past Surgical History:  Procedure Laterality Date   ANKLE FRACTURE SURGERY Left 03/08/2009   BREAST SURGERY Bilateral    Cyst Removal   GASTRIC BYPASS  06/18/2015   LAPAROSCOPIC VAGINAL HYSTERECTOMY WITH SALPINGECTOMY Bilateral 05/15/2020   Procedure: LAPAROSCOPIC ASSISTED VAGINAL HYSTERECTOMY WITH SALPINGECTOMY;  Surgeon: Louretta Shorten, MD;  Location: [facility];  Service: Gynecology;  Laterality: Bilateral;  need bed   LASER ABLATION     Varicose veins   TONSILLECTOMY AND ADENOIDECTOMY  2001   TUBAL LIGATION  12/19/1999   UPPER GASTROINTESTINAL ENDOSCOPY     UPPER GI ENDOSCOPY     Uterine ablation  12/2018   Patient Active Problem List   Diagnosis Date Noted   LUQ pain 03/01/2022   Change in bowel habits  03/01/2022   Loss of weight 03/01/2022   Chronic migraine without aura without status migrainosus, not intractable 06/14/2021   S/P laparoscopic assisted vaginal hysterectomy (LAVH) 05/15/2020   Migraines    GERD (gastroesophageal reflux disease)    Asthma    Seasonal and perennial allergic rhinitis 02/10/2019   Moderate persistent asthma, uncomplicated 85/01/7740   Anaphylactic shock due to adverse food reaction 02/10/2019   Atopic dermatitis 02/10/2019   History of Roux-en-Y gastric bypass 2016    THERAPY DIAG:  Cervicalgia  Abnormal posture  REFERRING DIAG:  M54.2 (ICD-10-CM) - Neck pain  M99.01 (ICD-10-CM) - Somatic dysfunction of cervical region  M99.02 (ICD-10-CM) - Somatic dysfunction of thoracic region  M99.08 (ICD-10-CM) - Somatic dysfunction of rib region  M25.512 (ICD-10-CM) - Acute pain of left shoulder    PERTINENT HISTORY: repeat trigger point injections and has received OMT treatments with last being 05/23/22 by Glennon Mac, DO.  PRECAUTIONS/RESTRICTIONS:   No  SUBJECTIVE:  Pt reports stiffness and pain in her L shoulder.  Pain: 8/10 L shoulder and periscapular pain Aggs: shoulder abd Eases: rest  OBJECTIVE:  DIAGNOSTIC FINDINGS:  05/10/22 MRI: Mild disc herniations C4-5, C5-6, C6-7, most prominent at C6-7 without spinal cord  impingement   PATIENT SURVEYS:  FOTO 40% function     COGNITION: Overall cognitive status: Within functional limits for tasks assessed     SENSATION: WFL   POSTURE:  sits with left lateral trunk lean, R shoulder heigher, R scapula lower, rounded shoulders bil, slight forward head, increased lumbar lordosis, L clavicle higher, L clavicular notch heigher, audible pop under L acromion    PALPATION: T2, T4-5, T7-8 hypomobile, pt with TTP along L Upper Trap,mid Trap, Teres; see posture section, coracoid process/acromion heigher left   CERVICAL ROM:    Active ROM A/PROM (deg) eval  Flexion WFL  Extension WFL  Right  lateral flexion WFL  Left lateral flexion WFL  Right rotation WFL  Left rotation WFL   (Blank rows = not tested)   UPPER EXTREMITY MMT:   MMT Right eval Left eval  Shoulder flexion 4+/5 3+/5 with pain   Shoulder extension      Shoulder abduction 4+/5 3+/5  Shoulder adduction 4+/5 3+/5  Shoulder extension      Shoulder internal rotation 4+/5 3+/5  Shoulder external rotation 4+/5 3+/5  Elbow flexion      Elbow extension      Wrist flexion      Wrist extension      Wrist ulnar deviation      Wrist radial deviation      Wrist pronation      Wrist supination       (Blank rows = not tested) mid-Trap,Teres 2-/5   UPPER EXTREMITY AROM:   MMT Right eval Left eval  Shoulder flexion [facility] WFL with pain  Shoulder extension      Shoulder abduction [facility] WFL  Shoulder adduction [facility]  Shoulder extension      Shoulder internal rotation [facility] WFL  Shoulder external rotation WFL WFL                                                                                (Blank rows = not tested)   CERVICAL SPECIAL TESTS:  NT     TODAY'S TREATMENT:  At eval: see pt education section     PATIENT EDUCATION:  Education details: HEP, postural education, anatomy review Person educated: Patient Education method: Explanation, Demonstration, and Handouts Education comprehension: verbalized understanding and returned demonstration     HOME EXERCISE PROGRAM: Access Code: [email or web address] URL: [email or web address] Date: 06/07/2022 Prepared by: America Brown   Exercises - Standing Scapular Depression  - 2 x daily - 7 x weekly - 1 sets - 15 reps - Seated Scapular Retraction  - 2 x daily - 7 x weekly - 1 sets - 15 reps - Latissimus Dorsi Stretch at Wall  - 2 x daily - 7 x weekly - 1 sets - 2 reps - 30 sec hold   TREATMENT 06/20/2022:  Therapeutic Exercise: - UBE 2.5'/2.5' fwd and backward for warm up while taking subjective   Manual therapy: Skilled palpation to identify  trigger points for TDN (needle insertion not charged) STM of bil sub occipitals, L cervical paraspinals, L infraspinatus, L supraspinatus, L UT  Trigger Point Dry-Needling  Treatment instructions: Expect mild to moderate muscle soreness. S/S of pneumothorax if dry needled over  a lung field, and to seek immediate medical attention should they occur. Patient verbalized understanding of these instructions and education.  Patient Consent Given: Yes Education handout provided: No Muscles treated: bil sub occipitals, L cervical paraspinals, L infraspinatus, L supraspinatus, L UT, L subscap Electrical stimulation performed: No Parameters: N/A Treatment response/outcome: twitch and pain relief    ASSESSMENT:   CLINICAL IMPRESSION: Pt signs and sxs consistent with SAPS with concurrent neck pain and periscapular myofacial pain.  Strong concentration on manual therapy and TDN today.  Will monitor response at next visit.  Will integrate increasing amounts of cuff and periscapular strengthening as we progress.  No adverse events today.     OBJECTIVE IMPAIRMENTS decreased activity tolerance, decreased coordination, decreased mobility, decreased ROM, decreased strength, hypomobility, increased muscle spasms, impaired flexibility, impaired UE functional use, postural dysfunction, and pain.    ACTIVITY LIMITATIONS carrying, reach over head, and locomotion level   PARTICIPATION LIMITATIONS: meal prep, community activity, and occupation   [place] are also affecting patient's functional outcome.    REHAB POTENTIAL: Excellent   CLINICAL DECISION MAKING: Stable/uncomplicated   EVALUATION COMPLEXITY: Low     GOALS: Goals reviewed with patient? Yes   SHORT TERM GOALS: Target date: 07/05/2022    Pt will be indep with initial HEP to assist with pain management Baseline: initiated at eval Goal status: INITIAL   2.  Pt will report 25% improvement in L shoulder symptoms with  functional activities at work Baseline: 100% present Goal status: INITIAL     LONG TERM GOALS: Target date:  07/19/22   Pt will be able to wipe down the scales with a circular motion at work without pain Baseline: 8/10 Goal status: INITIAL   2.  Pt will be able to perform left lateral cervical sidebend at work without pain Baseline: 10/10 Goal status: INITIAL   3.  Pt will be able to mop/sweep without pain Baseline: 6/10 Goal status: INITIAL   4.  Pt will have improved FOTO score to >/=57% function Baseline: 40% function Goal status: INITIAL   5.  Pt will demo improvement in L shoulder strength to >/= 4+/5 to assist with work duties Baseline: 3+/5 throughout except mid trap/teres 2-/5 Goal status: INITIAL         PLAN: PT FREQUENCY: 2x/week   PT DURATION: other: 6 weeks   PLANNED INTERVENTIONS: Therapeutic exercises, Therapeutic activity, Neuromuscular re-education, Patient/Family education, Joint mobilization, Dry Needling, Moist heat, Taping, Ultrasound, Manual therapy, and Re-evaluation   PLAN FOR NEXT SESSION: review HEP, L clavicular/scapular mobs, manual therapy for L Upper Trap/Teres/Mid trap tightness; strengthening L shoulder, dry needling   Kevan Ny Carrigan Delafuente PT 06/20/2022, 1:35 PM

## 2022-06-20 NOTE — Patient Instructions (Signed)
Good to see you   

## 2022-06-21 ENCOUNTER — Ambulatory Visit: Payer: 59 | Admitting: Rehabilitative and Restorative Service Providers"

## 2022-06-21 ENCOUNTER — Encounter: Payer: Self-pay | Admitting: Rehabilitative and Restorative Service Providers"

## 2022-06-21 DIAGNOSIS — M9901 Segmental and somatic dysfunction of cervical region: Secondary | ICD-10-CM | POA: Diagnosis not present

## 2022-06-21 DIAGNOSIS — M9902 Segmental and somatic dysfunction of thoracic region: Secondary | ICD-10-CM | POA: Diagnosis not present

## 2022-06-21 DIAGNOSIS — M542 Cervicalgia: Secondary | ICD-10-CM | POA: Diagnosis not present

## 2022-06-21 DIAGNOSIS — M9908 Segmental and somatic dysfunction of rib cage: Secondary | ICD-10-CM | POA: Diagnosis not present

## 2022-06-21 DIAGNOSIS — R293 Abnormal posture: Secondary | ICD-10-CM | POA: Diagnosis not present

## 2022-06-21 DIAGNOSIS — M25512 Pain in left shoulder: Secondary | ICD-10-CM | POA: Diagnosis not present

## 2022-06-21 NOTE — Therapy (Signed)
OUTPATIENT PHYSICAL THERAPY TREATMENT NOTE   Patient Name: Donna Park MRN: [id number] DOB:1971/11/29, 51 y.o., female Today's Date: 06/21/2022  PCP: Dorothyann Peng REFERRING PROVIDER: [name]   PT End of Session - 06/21/22 1120     Visit Number 3    Number of Visits 10    Date for PT Re-Evaluation 07/19/22    Authorization Type UMR    Progress Note Due on Visit 10    PT Start Time 1120    PT Stop Time 1205    PT Time Calculation (min) 45 min    Activity Tolerance Patient tolerated treatment well;No increased pain    Behavior During Therapy WFL for tasks assessed/performed              Past Medical History:  Diagnosis Date   Allergy    Anxiety    Arthritis    Asthma    Chicken pox    Complication of anesthesia    slow to wake up from anesthesia   Depression    DJD (degenerative joint disease)    GERD (gastroesophageal reflux disease)    resolved after gastric surgery   H/O gastric bypass 2016   History of iron deficiency anemia    HSV infection    Migraines    Obese    Pneumonia    Childhood history   PONV (postoperative nausea and vomiting)    Status post dilation of esophageal narrowing    Varicose vein of leg    Past Surgical History:  Procedure Laterality Date   ANKLE FRACTURE SURGERY Left 03/08/2009   BREAST SURGERY Bilateral    Cyst Removal   GASTRIC BYPASS  06/18/2015   LAPAROSCOPIC VAGINAL HYSTERECTOMY WITH SALPINGECTOMY Bilateral 05/15/2020   Procedure: LAPAROSCOPIC ASSISTED VAGINAL HYSTERECTOMY WITH SALPINGECTOMY;  Surgeon: Louretta Shorten, MD;  Location: [facility];  Service: Gynecology;  Laterality: Bilateral;  need bed   LASER ABLATION     Varicose veins   TONSILLECTOMY AND ADENOIDECTOMY  2001   TUBAL LIGATION  12/19/1999   UPPER GASTROINTESTINAL ENDOSCOPY     UPPER GI ENDOSCOPY     Uterine ablation  12/2018   Patient Active Problem List   Diagnosis Date Noted   LUQ pain 03/01/2022   Change in bowel habits  03/01/2022   Loss of weight 03/01/2022   Chronic migraine without aura without status migrainosus, not intractable 06/14/2021   S/P laparoscopic assisted vaginal hysterectomy (LAVH) 05/15/2020   Migraines    GERD (gastroesophageal reflux disease)    Asthma    Seasonal and perennial allergic rhinitis 02/10/2019   Moderate persistent asthma, uncomplicated 94/06/6807   Anaphylactic shock due to adverse food reaction 02/10/2019   Atopic dermatitis 02/10/2019   History of Roux-en-Y gastric bypass 2016    THERAPY DIAG:  Cervicalgia  Abnormal posture  REFERRING DIAG:  M54.2 (ICD-10-CM) - Neck pain  M99.01 (ICD-10-CM) - Somatic dysfunction of cervical region  M99.02 (ICD-10-CM) - Somatic dysfunction of thoracic region  M99.08 (ICD-10-CM) - Somatic dysfunction of rib region  M25.512 (ICD-10-CM) - Acute pain of left shoulder    PERTINENT HISTORY: repeat trigger point injections and has received OMT treatments with last being 05/23/22 by Glennon Mac, DO.  PRECAUTIONS/RESTRICTIONS:   No  SUBJECTIVE:  Dry needling helped.   Pain: 8/10 L shoulder and periscapular pain Aggs: shoulder abd Eases: rest  OBJECTIVE:  DIAGNOSTIC FINDINGS:  05/10/22 MRI: Mild disc herniations C4-5, C5-6, C6-7, most prominent at C6-7 without spinal cord impingement   PATIENT  SURVEYS:  FOTO 40% function     COGNITION: Overall cognitive status: Within functional limits for tasks assessed     SENSATION: WFL   POSTURE:  sits with left lateral trunk lean, R shoulder heigher, R scapula lower, rounded shoulders bil, slight forward head, increased lumbar lordosis, L clavicle higher, L clavicular notch heigher, audible pop under L acromion    PALPATION: T2, T4-5, T7-8 hypomobile, pt with TTP along L Upper Trap,mid Trap, Teres; see posture section, coracoid process/acromion heigher left   CERVICAL ROM:    Active ROM A/PROM (deg) eval  Flexion WFL  Extension WFL  Right lateral flexion WFL  Left  lateral flexion WFL  Right rotation WFL  Left rotation WFL   (Blank rows = not tested)   UPPER EXTREMITY MMT:   MMT Right eval Left eval  Shoulder flexion 4+/5 3+/5 with pain   Shoulder extension      Shoulder abduction 4+/5 3+/5  Shoulder adduction 4+/5 3+/5  Shoulder extension      Shoulder internal rotation 4+/5 3+/5  Shoulder external rotation 4+/5 3+/5  Elbow flexion      Elbow extension      Wrist flexion      Wrist extension      Wrist ulnar deviation      Wrist radial deviation      Wrist pronation      Wrist supination       (Blank rows = not tested) mid-Trap,Teres 2-/5   UPPER EXTREMITY AROM:   MMT Right eval Left eval  Shoulder flexion [facility] WFL with pain  Shoulder extension      Shoulder abduction [facility] WFL  Shoulder adduction [facility]  Shoulder extension      Shoulder internal rotation [facility] WFL  Shoulder external rotation WFL WFL                                                                                (Blank rows = not tested)   CERVICAL SPECIAL TESTS:  NT     TODAY'S TREATMENT:  At eval: see pt education section     PATIENT EDUCATION:  Education details: HEP, postural education, anatomy review Person educated: Patient Education method: Explanation, Demonstration, and Handouts Education comprehension: verbalized understanding and returned demonstration     HOME EXERCISE PROGRAM: Access Code: [email or web address] URL: [email or web address] Date: 06/07/2022 Prepared by: America Brown   Exercises - Standing Scapular Depression  - 2 x daily - 7 x weekly - 1 sets - 15 reps - Seated Scapular Retraction  - 2 x daily - 7 x weekly - 1 sets - 15 reps - Latissimus Dorsi Stretch at Wall  - 2 x daily - 7 x weekly - 1 sets - 2 reps - 30 sec hold  [facility] Adult PT Treatment:                                                DATE: 06/21/22 Therapeutic Exercise: UBE level 3 posterior with PT asking how pt did with dry needling Reviewed HEP Manual  Therapy:  L scapular inferior mobs grade 2-3 seated L Upper Trap/Teres/Rhomboid trigger point seated L distal clavicular inferior mobs grade 2-3 seated L scapular mobs in R sidelying all directions with and without UE resting overhead in flexion L posterior humeral AP mobs grade 3 in supine Pt without pain with mobs  TREATMENT 06/20/2022:  Therapeutic Exercise: - UBE 2.5'/2.5' fwd and backward for warm up while taking subjective   Manual therapy: Skilled palpation to identify trigger points for TDN (needle insertion not charged) STM of bil sub occipitals, L cervical paraspinals, L infraspinatus, L supraspinatus, L UT  Trigger Point Dry-Needling  Treatment instructions: Expect mild to moderate muscle soreness. S/S of pneumothorax if dry needled over a lung field, and to seek immediate medical attention should they occur. Patient verbalized understanding of these instructions and education.  Patient Consent Given: Yes Education handout provided: No Muscles treated: bil sub occipitals, L cervical paraspinals, L infraspinatus, L supraspinatus, L UT, L subscap Electrical stimulation performed: No Parameters: N/A Treatment response/outcome: twitch and pain relief    ASSESSMENT:   CLINICAL IMPRESSION: Pt reports improvement in tightness after dry needling session; however, she continues to report pain in L shoulder to be 8/10 with L shoulder abdct/ext combo with notable L humeral anterior rotation at end ROM of aggravating position.  Strong concentration on manual therapy today.  Continue dry needling, manual therapy and begin scapular TE.     OBJECTIVE IMPAIRMENTS decreased activity tolerance, decreased coordination, decreased mobility, decreased ROM, decreased strength, hypomobility, increased muscle spasms, impaired flexibility, impaired UE functional use, postural dysfunction, and pain.    ACTIVITY LIMITATIONS carrying, reach over head, and locomotion level   PARTICIPATION  LIMITATIONS: meal prep, community activity, and occupation   [place] are also affecting patient's functional outcome.    REHAB POTENTIAL: Excellent   CLINICAL DECISION MAKING: Stable/uncomplicated   EVALUATION COMPLEXITY: Low     GOALS: Goals reviewed with patient? Yes   SHORT TERM GOALS: Target date: 07/05/2022    Pt will be indep with initial HEP to assist with pain management Baseline: initiated at eval Goal status: INITIAL   2.  Pt will report 25% improvement in L shoulder symptoms with functional activities at work Baseline: 100% present Goal status: INITIAL     LONG TERM GOALS: Target date:  07/19/22   Pt will be able to wipe down the scales with a circular motion at work without pain Baseline: 8/10 Goal status: INITIAL   2.  Pt will be able to perform left lateral cervical sidebend at work without pain Baseline: 10/10 Goal status: INITIAL   3.  Pt will be able to mop/sweep without pain Baseline: 6/10 Goal status: INITIAL   4.  Pt will have improved FOTO score to >/=57% function Baseline: 40% function Goal status: INITIAL   5.  Pt will demo improvement in L shoulder strength to >/= 4+/5 to assist with work duties Baseline: 3+/5 throughout except mid trap/teres 2-/5 Goal status: INITIAL         PLAN: PT FREQUENCY: 2x/week   PT DURATION: other: 6 weeks   PLANNED INTERVENTIONS: Therapeutic exercises, Therapeutic activity, Neuromuscular re-education, Patient/Family education, Joint mobilization, Dry Needling, Moist heat, Taping, Ultrasound, Manual therapy, and Re-evaluation   PLAN FOR NEXT SESSION:  L clavicular/scapular mobs, manual therapy for L Upper Trap/Teres/Mid trap tightness; strengthening L shoulder, dry needling   Erenest Rasher PT 06/21/2022, 12:11 PM

## 2022-06-24 ENCOUNTER — Other Ambulatory Visit (HOSPITAL_COMMUNITY): Payer: Self-pay

## 2022-06-24 ENCOUNTER — Ambulatory Visit: Payer: 59 | Admitting: Physical Therapy

## 2022-06-24 ENCOUNTER — Telehealth: Payer: Self-pay | Admitting: Allergy & Immunology

## 2022-06-24 ENCOUNTER — Encounter: Payer: Self-pay | Admitting: Physical Therapy

## 2022-06-24 DIAGNOSIS — M9902 Segmental and somatic dysfunction of thoracic region: Secondary | ICD-10-CM | POA: Diagnosis not present

## 2022-06-24 DIAGNOSIS — M25512 Pain in left shoulder: Secondary | ICD-10-CM | POA: Diagnosis not present

## 2022-06-24 DIAGNOSIS — M542 Cervicalgia: Secondary | ICD-10-CM

## 2022-06-24 DIAGNOSIS — M9908 Segmental and somatic dysfunction of rib cage: Secondary | ICD-10-CM | POA: Diagnosis not present

## 2022-06-24 DIAGNOSIS — R293 Abnormal posture: Secondary | ICD-10-CM | POA: Diagnosis not present

## 2022-06-24 DIAGNOSIS — M9901 Segmental and somatic dysfunction of cervical region: Secondary | ICD-10-CM | POA: Diagnosis not present

## 2022-06-24 MED ORDER — PREDNISONE 10 MG PO TABS
ORAL_TABLET | ORAL | 0 refills | Status: AC
  Filled 2022-06-24: qty 18, 6d supply, fill #0

## 2022-06-24 NOTE — Therapy (Signed)
OUTPATIENT PHYSICAL THERAPY TREATMENT NOTE   Patient Name: Donna Park MRN: [id number] DOB:1971-04-03, 51 y.o., female Today's Date: 06/25/2022  PCP: Dorothyann Peng REFERRING PROVIDER: [name]   PT End of Session - 06/24/22 1821     Visit Number 4    Number of Visits 10    Date for PT Re-Evaluation 07/19/22    Authorization Type UMR    Progress Note Due on Visit 10    PT Start Time 1820    PT Stop Time 1900    PT Time Calculation (min) 40 min    Activity Tolerance Patient tolerated treatment well;No increased pain    Behavior During Therapy WFL for tasks assessed/performed              Past Medical History:  Diagnosis Date   Allergy    Anxiety    Arthritis    Asthma    Chicken pox    Complication of anesthesia    slow to wake up from anesthesia   Depression    DJD (degenerative joint disease)    GERD (gastroesophageal reflux disease)    resolved after gastric surgery   H/O gastric bypass 2016   History of iron deficiency anemia    HSV infection    Migraines    Obese    Pneumonia    Childhood history   PONV (postoperative nausea and vomiting)    Status post dilation of esophageal narrowing    Varicose vein of leg    Past Surgical History:  Procedure Laterality Date   ANKLE FRACTURE SURGERY Left 03/08/2009   BREAST SURGERY Bilateral    Cyst Removal   GASTRIC BYPASS  06/18/2015   LAPAROSCOPIC VAGINAL HYSTERECTOMY WITH SALPINGECTOMY Bilateral 05/15/2020   Procedure: LAPAROSCOPIC ASSISTED VAGINAL HYSTERECTOMY WITH SALPINGECTOMY;  Surgeon: Louretta Shorten, MD;  Location: [facility];  Service: Gynecology;  Laterality: Bilateral;  need bed   LASER ABLATION     Varicose veins   TONSILLECTOMY AND ADENOIDECTOMY  2001   TUBAL LIGATION  12/19/1999   UPPER GASTROINTESTINAL ENDOSCOPY     UPPER GI ENDOSCOPY     Uterine ablation  12/2018   Patient Active Problem List   Diagnosis Date Noted   LUQ pain 03/01/2022   Change in bowel habits  03/01/2022   Loss of weight 03/01/2022   Chronic migraine without aura without status migrainosus, not intractable 06/14/2021   S/P laparoscopic assisted vaginal hysterectomy (LAVH) 05/15/2020   Migraines    GERD (gastroesophageal reflux disease)    Asthma    Seasonal and perennial allergic rhinitis 02/10/2019   Moderate persistent asthma, uncomplicated 17/49/4496   Anaphylactic shock due to adverse food reaction 02/10/2019   Atopic dermatitis 02/10/2019   History of Roux-en-Y gastric bypass 2016    THERAPY DIAG:  Cervicalgia  Abnormal posture  REFERRING DIAG:  M54.2 (ICD-10-CM) - Neck pain  M99.01 (ICD-10-CM) - Somatic dysfunction of cervical region  M99.02 (ICD-10-CM) - Somatic dysfunction of thoracic region  M99.08 (ICD-10-CM) - Somatic dysfunction of rib region  M25.512 (ICD-10-CM) - Acute pain of left shoulder    PERTINENT HISTORY: repeat trigger point injections and has received OMT treatments with last being 05/23/22 by Glennon Mac, DO.  PRECAUTIONS/RESTRICTIONS:   No  SUBJECTIVE:  Pt reports that dry needling was helpful for her, but her shoulder and neck remain painful.  Pain: 8/10 L shoulder and periscapular pain Aggs: shoulder abd Eases: rest  OBJECTIVE:  DIAGNOSTIC FINDINGS:  05/10/22 MRI: Mild disc herniations C4-5, C5-6,  C6-7, most prominent at C6-7 without spinal cord impingement   PATIENT SURVEYS:  FOTO 40% function     COGNITION: Overall cognitive status: Within functional limits for tasks assessed     SENSATION: WFL   POSTURE:  sits with left lateral trunk lean, R shoulder heigher, R scapula lower, rounded shoulders bil, slight forward head, increased lumbar lordosis, L clavicle higher, L clavicular notch heigher, audible pop under L acromion    PALPATION: T2, T4-5, T7-8 hypomobile, pt with TTP along L Upper Trap,mid Trap, Teres; see posture section, coracoid process/acromion heigher left   CERVICAL ROM:    Active ROM A/PROM  (deg) eval  Flexion WFL  Extension WFL  Right lateral flexion WFL  Left lateral flexion WFL  Right rotation WFL  Left rotation WFL   (Blank rows = not tested)   UPPER EXTREMITY MMT:   MMT Right eval Left eval  Shoulder flexion 4+/5 3+/5 with pain   Shoulder extension      Shoulder abduction 4+/5 3+/5  Shoulder adduction 4+/5 3+/5  Shoulder extension      Shoulder internal rotation 4+/5 3+/5  Shoulder external rotation 4+/5 3+/5  Elbow flexion      Elbow extension      Wrist flexion      Wrist extension      Wrist ulnar deviation      Wrist radial deviation      Wrist pronation      Wrist supination       (Blank rows = not tested) mid-Trap,Teres 2-/5   UPPER EXTREMITY AROM:   MMT Right eval Left eval  Shoulder flexion [facility] WFL with pain  Shoulder extension      Shoulder abduction [facility] WFL  Shoulder adduction [facility]  Shoulder extension      Shoulder internal rotation [facility] WFL  Shoulder external rotation WFL WFL                                                                                (Blank rows = not tested)   CERVICAL SPECIAL TESTS:  NT     TODAY'S TREATMENT:  At eval: see pt education section     PATIENT EDUCATION:  Education details: HEP, postural education, anatomy review Person educated: Patient Education method: Explanation, Demonstration, and Handouts Education comprehension: verbalized understanding and returned demonstration     HOME EXERCISE PROGRAM: Access Code: [email or web address] URL: [email or web address] Date: 06/07/2022 Prepared by: America Brown   Exercises - Standing Scapular Depression  - 2 x daily - 7 x weekly - 1 sets - 15 reps - Seated Scapular Retraction  - 2 x daily - 7 x weekly - 1 sets - 15 reps - Latissimus Dorsi Stretch at Wall  - 2 x daily - 7 x weekly - 1 sets - 2 reps - 30 sec hold  TREATMENT 06/24/2022:  Therapeutic Exercise: - UBE 2.5'/2.5' fwd and backward for warm up while taking subjective -  low row - 30# - 2x6 - high row - 30# - 2x6 - lat pull down - 30# - 2x8 - 5# OH press - 2x10 ea - S/L ER - 3x10 - L    Manual therapy: Skilled palpation  to identify trigger points for TDN (needle insertion not charged) STM of bil sub occipitals, L cervical paraspinals, L infraspinatus, L supraspinatus, L UT  Trigger Point Dry-Needling  Treatment instructions: Expect mild to moderate muscle soreness. S/S of pneumothorax if dry needled over a lung field, and to seek immediate medical attention should they occur. Patient verbalized understanding of these instructions and education.  Patient Consent Given: Yes Education handout provided: No Muscles treated: bil sub occipitals, L cervical paraspinals, L infraspinatus, L supraspinatus, L UT, L subscap Electrical stimulation performed: No Parameters: N/A Treatment response/outcome: twitch and pain relief  [facility] Adult PT Treatment:                                                DATE: 06/21/22 Therapeutic Exercise: UBE level 3 posterior with PT asking how pt did with dry needling Reviewed HEP Manual Therapy: L scapular inferior mobs grade 2-3 seated L Upper Trap/Teres/Rhomboid trigger point seated L distal clavicular inferior mobs grade 2-3 seated L scapular mobs in R sidelying all directions with and without UE resting overhead in flexion L posterior humeral AP mobs grade 3 in supine Pt without pain with mobs  TREATMENT 06/20/2022:  Therapeutic Exercise: - UBE 2.5'/2.5' fwd and backward for warm up while taking subjective   Manual therapy: Skilled palpation to identify trigger points for TDN (needle insertion not charged) STM of bil sub occipitals, L cervical paraspinals, L infraspinatus, L supraspinatus, L UT  Trigger Point Dry-Needling  Treatment instructions: Expect mild to moderate muscle soreness. S/S of pneumothorax if dry needled over a lung field, and to seek immediate medical attention should they occur. Patient verbalized  understanding of these instructions and education.  Patient Consent Given: Yes Education handout provided: No Muscles treated: bil sub occipitals, L cervical paraspinals, L infraspinatus, L supraspinatus, L UT, L subscap Electrical stimulation performed: No Parameters: N/A Treatment response/outcome: twitch and pain relief    ASSESSMENT:   CLINICAL IMPRESSION: Sharonlee continues to have high levels of pain.  She was able to tolerate increased therex today and reported significant pain reduction following TDN and manual.     OBJECTIVE IMPAIRMENTS decreased activity tolerance, decreased coordination, decreased mobility, decreased ROM, decreased strength, hypomobility, increased muscle spasms, impaired flexibility, impaired UE functional use, postural dysfunction, and pain.    ACTIVITY LIMITATIONS carrying, reach over head, and locomotion level   PARTICIPATION LIMITATIONS: meal prep, community activity, and occupation   [place] are also affecting patient's functional outcome.    REHAB POTENTIAL: Excellent   CLINICAL DECISION MAKING: Stable/uncomplicated   EVALUATION COMPLEXITY: Low     GOALS: Goals reviewed with patient? Yes   SHORT TERM GOALS: Target date: 07/05/2022    Pt will be indep with initial HEP to assist with pain management Baseline: initiated at eval Goal status: INITIAL   2.  Pt will report 25% improvement in L shoulder symptoms with functional activities at work Baseline: 100% present Goal status: INITIAL     LONG TERM GOALS: Target date:  07/19/22   Pt will be able to wipe down the scales with a circular motion at work without pain Baseline: 8/10 Goal status: INITIAL   2.  Pt will be able to perform left lateral cervical sidebend at work without pain Baseline: 10/10 Goal status: INITIAL   3.  Pt will be able to mop/sweep without pain  Baseline: 6/10 Goal status: INITIAL   4.  Pt will have improved FOTO score to >/=57%  function Baseline: 40% function Goal status: INITIAL   5.  Pt will demo improvement in L shoulder strength to >/= 4+/5 to assist with work duties Baseline: 3+/5 throughout except mid trap/teres 2-/5 Goal status: INITIAL         PLAN: PT FREQUENCY: 2x/week   PT DURATION: other: 6 weeks   PLANNED INTERVENTIONS: Therapeutic exercises, Therapeutic activity, Neuromuscular re-education, Patient/Family education, Joint mobilization, Dry Needling, Moist heat, Taping, Ultrasound, Manual therapy, and Re-evaluation   PLAN FOR NEXT SESSION:  L clavicular/scapular mobs, manual therapy for L Upper Trap/Teres/Mid trap tightness; strengthening L shoulder, dry needling   Kevan Ny Edman Lipsey PT 06/25/2022, 9:12 AM

## 2022-06-24 NOTE — Telephone Encounter (Signed)
Patient requested prednisone taper for treatment of SOB. Confirmed pharmacy and sent this in.   Salvatore Marvel, MD [facility] [place] [facility] [place]

## 2022-06-28 ENCOUNTER — Encounter: Payer: Self-pay | Admitting: Adult Health

## 2022-06-28 ENCOUNTER — Telehealth: Payer: Self-pay | Admitting: Physical Therapy

## 2022-06-28 ENCOUNTER — Ambulatory Visit: Payer: 59 | Admitting: Physical Therapy

## 2022-06-28 DIAGNOSIS — Z13820 Encounter for screening for osteoporosis: Secondary | ICD-10-CM

## 2022-06-28 NOTE — Telephone Encounter (Signed)
Called and informed patient of missed visit and provided reminder of next appt and attendance policy.  

## 2022-07-01 ENCOUNTER — Ambulatory Visit: Payer: 59 | Admitting: Physical Therapy

## 2022-07-01 ENCOUNTER — Encounter: Payer: Self-pay | Admitting: Physical Therapy

## 2022-07-01 DIAGNOSIS — M9908 Segmental and somatic dysfunction of rib cage: Secondary | ICD-10-CM | POA: Diagnosis not present

## 2022-07-01 DIAGNOSIS — R293 Abnormal posture: Secondary | ICD-10-CM

## 2022-07-01 DIAGNOSIS — M25512 Pain in left shoulder: Secondary | ICD-10-CM | POA: Diagnosis not present

## 2022-07-01 DIAGNOSIS — M542 Cervicalgia: Secondary | ICD-10-CM

## 2022-07-01 DIAGNOSIS — M9902 Segmental and somatic dysfunction of thoracic region: Secondary | ICD-10-CM | POA: Diagnosis not present

## 2022-07-01 DIAGNOSIS — M9901 Segmental and somatic dysfunction of cervical region: Secondary | ICD-10-CM | POA: Diagnosis not present

## 2022-07-01 NOTE — Therapy (Signed)
OUTPATIENT PHYSICAL THERAPY TREATMENT NOTE   Patient Name: Donna Park MRN: [id number] DOB:13-Feb-1971, 51 y.o., female Today's Date: 07/02/2022  PCP: Dorothyann Peng REFERRING PROVIDER: [name]   PT End of Session - 07/01/22 1816     Visit Number 5    Number of Visits 10    Date for PT Re-Evaluation 07/19/22    Authorization Type UMR    Progress Note Due on Visit 10    PT Start Time 1816    PT Stop Time 1856    PT Time Calculation (min) 40 min    Activity Tolerance Patient tolerated treatment well;No increased pain    Behavior During Therapy WFL for tasks assessed/performed              Past Medical History:  Diagnosis Date   Allergy    Anxiety    Arthritis    Asthma    Chicken pox    Complication of anesthesia    slow to wake up from anesthesia   Depression    DJD (degenerative joint disease)    GERD (gastroesophageal reflux disease)    resolved after gastric surgery   H/O gastric bypass 2016   History of iron deficiency anemia    HSV infection    Migraines    Obese    Pneumonia    Childhood history   PONV (postoperative nausea and vomiting)    Status post dilation of esophageal narrowing    Varicose vein of leg    Past Surgical History:  Procedure Laterality Date   ANKLE FRACTURE SURGERY Left 03/08/2009   BREAST SURGERY Bilateral    Cyst Removal   GASTRIC BYPASS  06/18/2015   LAPAROSCOPIC VAGINAL HYSTERECTOMY WITH SALPINGECTOMY Bilateral 05/15/2020   Procedure: LAPAROSCOPIC ASSISTED VAGINAL HYSTERECTOMY WITH SALPINGECTOMY;  Surgeon: Louretta Shorten, MD;  Location: [facility];  Service: Gynecology;  Laterality: Bilateral;  need bed   LASER ABLATION     Varicose veins   TONSILLECTOMY AND ADENOIDECTOMY  2001   TUBAL LIGATION  12/19/1999   UPPER GASTROINTESTINAL ENDOSCOPY     UPPER GI ENDOSCOPY     Uterine ablation  12/2018   Patient Active Problem List   Diagnosis Date Noted   LUQ pain 03/01/2022   Change in bowel habits  03/01/2022   Loss of weight 03/01/2022   Chronic migraine without aura without status migrainosus, not intractable 06/14/2021   S/P laparoscopic assisted vaginal hysterectomy (LAVH) 05/15/2020   Migraines    GERD (gastroesophageal reflux disease)    Asthma    Seasonal and perennial allergic rhinitis 02/10/2019   Moderate persistent asthma, uncomplicated 84/16/6063   Anaphylactic shock due to adverse food reaction 02/10/2019   Atopic dermatitis 02/10/2019   History of Roux-en-Y gastric bypass 2016    THERAPY DIAG:  Cervicalgia  Abnormal posture  REFERRING DIAG:  M54.2 (ICD-10-CM) - Neck pain  M99.01 (ICD-10-CM) - Somatic dysfunction of cervical region  M99.02 (ICD-10-CM) - Somatic dysfunction of thoracic region  M99.08 (ICD-10-CM) - Somatic dysfunction of rib region  M25.512 (ICD-10-CM) - Acute pain of left shoulder    PERTINENT HISTORY: repeat trigger point injections and has received OMT treatments with last being 05/23/22 by Glennon Mac, DO.  PRECAUTIONS/RESTRICTIONS:   No  SUBJECTIVE:  Pt reports that her shoulder is doing a little better, she has been HEP compliant.  Pain: 6/10 L shoulder and periscapular pain Aggs: shoulder abd Eases: rest  OBJECTIVE:  DIAGNOSTIC FINDINGS:  05/10/22 MRI: Mild disc herniations C4-5, C5-6, C6-7,  most prominent at C6-7 without spinal cord impingement   PATIENT SURVEYS:  FOTO 40% function     COGNITION: Overall cognitive status: Within functional limits for tasks assessed     SENSATION: WFL   POSTURE:  sits with left lateral trunk lean, R shoulder heigher, R scapula lower, rounded shoulders bil, slight forward head, increased lumbar lordosis, L clavicle higher, L clavicular notch heigher, audible pop under L acromion    PALPATION: T2, T4-5, T7-8 hypomobile, pt with TTP along L Upper Trap,mid Trap, Teres; see posture section, coracoid process/acromion heigher left   CERVICAL ROM:    Active ROM A/PROM (deg) eval   Flexion WFL  Extension WFL  Right lateral flexion WFL  Left lateral flexion WFL  Right rotation WFL  Left rotation WFL   (Blank rows = not tested)   UPPER EXTREMITY MMT:   MMT Right eval Left eval  Shoulder flexion 4+/5 3+/5 with pain   Shoulder extension      Shoulder abduction 4+/5 3+/5  Shoulder adduction 4+/5 3+/5  Shoulder extension      Shoulder internal rotation 4+/5 3+/5  Shoulder external rotation 4+/5 3+/5  Elbow flexion      Elbow extension      Wrist flexion      Wrist extension      Wrist ulnar deviation      Wrist radial deviation      Wrist pronation      Wrist supination       (Blank rows = not tested) mid-Trap,Teres 2-/5   UPPER EXTREMITY AROM:   MMT Right eval Left eval  Shoulder flexion [facility] WFL with pain  Shoulder extension      Shoulder abduction [facility] WFL  Shoulder adduction [facility]  Shoulder extension      Shoulder internal rotation [facility] WFL  Shoulder external rotation WFL WFL                                                                                (Blank rows = not tested)   CERVICAL SPECIAL TESTS:  NT     TODAY'S TREATMENT:  At eval: see pt education section     PATIENT EDUCATION:  Education details: HEP, postural education, anatomy review Person educated: Patient Education method: Explanation, Demonstration, and Handouts Education comprehension: verbalized understanding and returned demonstration     HOME EXERCISE PROGRAM: Access Code: [email or web address] URL: [email or web address] Date: 06/07/2022 Prepared by: America Brown   Exercises - Standing Scapular Depression  - 2 x daily - 7 x weekly - 1 sets - 15 reps - Seated Scapular Retraction  - 2 x daily - 7 x weekly - 1 sets - 15 reps - Latissimus Dorsi Stretch at Wall  - 2 x daily - 7 x weekly - 1 sets - 2 reps - 30 sec hold  TREATMENT 07/01/2022:  Therapeutic Exercise: - UBE 2.5'/2.5' fwd and backward for warm up while taking subjective - low row - 35#  - 2x6 - high row - 35# - 2x6 - lat pull down - 35# - 2x8 - 5# OH press - 2x10 ea - S/L ER - 3x10 - L - 2#  Manual therapy: Skilled palpation to  identify trigger points for TDN (needle insertion not charged) STM of bil sub occipitals, Bil cervical paraspinals, Bil infraspinatus, Bil supraspinatus, Bil UT, Bil LS  Trigger Point Dry-Needling  Treatment instructions: Expect mild to moderate muscle soreness. S/S of pneumothorax if dry needled over a lung field, and to seek immediate medical attention should they occur. Patient verbalized understanding of these instructions and education.  Patient Consent Given: Yes Education handout provided: No Muscles treated: bil sub occipitals, bil cervical paraspinals, bil infraspinatus, bil supraspinatus, bil UT, bil subscap, bil LS Electrical stimulation performed: No Parameters: N/A Treatment response/outcome: twitch and pain relief  TREATMENT 06/24/2022:  Therapeutic Exercise: - UBE 2.5'/2.5' fwd and backward for warm up while taking subjective - low row - 30# - 2x6 - high row - 30# - 2x6 - lat pull down - 30# - 2x8 - 5# OH press - 2x10 ea - S/L ER - 3x10 - L    Manual therapy: Skilled palpation to identify trigger points for TDN (needle insertion not charged) STM of bil sub occipitals, L cervical paraspinals, L infraspinatus, L supraspinatus, L UT  Trigger Point Dry-Needling  Treatment instructions: Expect mild to moderate muscle soreness. S/S of pneumothorax if dry needled over a lung field, and to seek immediate medical attention should they occur. Patient verbalized understanding of these instructions and education.  Patient Consent Given: Yes Education handout provided: No Muscles treated: bil sub occipitals, L cervical paraspinals, L infraspinatus, L supraspinatus, L UT, L subscap Electrical stimulation performed: No Parameters: N/A Treatment response/outcome: twitch and pain relief  [facility] Adult PT Treatment:                                                 DATE: 06/21/22 Therapeutic Exercise: UBE level 3 posterior with PT asking how pt did with dry needling Reviewed HEP Manual Therapy: L scapular inferior mobs grade 2-3 seated L Upper Trap/Teres/Rhomboid trigger point seated L distal clavicular inferior mobs grade 2-3 seated L scapular mobs in R sidelying all directions with and without UE resting overhead in flexion L posterior humeral AP mobs grade 3 in supine Pt without pain with mobs  TREATMENT 06/20/2022:  Therapeutic Exercise: - UBE 2.5'/2.5' fwd and backward for warm up while taking subjective   Manual therapy: Skilled palpation to identify trigger points for TDN (needle insertion not charged) STM of bil sub occipitals, L cervical paraspinals, L infraspinatus, L supraspinatus, L UT  Trigger Point Dry-Needling  Treatment instructions: Expect mild to moderate muscle soreness. S/S of pneumothorax if dry needled over a lung field, and to seek immediate medical attention should they occur. Patient verbalized understanding of these instructions and education.  Patient Consent Given: Yes Education handout provided: No Muscles treated: bil sub occipitals, L cervical paraspinals, L infraspinatus, L supraspinatus, L UT, L subscap Electrical stimulation performed: No Parameters: N/A Treatment response/outcome: twitch and pain relief    ASSESSMENT:   CLINICAL IMPRESSION: Pt tolerates session well with no adverse reaction.  She reports significant pain relief with TDN and manual.  Able to increase load with rows and lat pull down.  Will continue to gradually increase load and volume of periscapular and R/C exercises.     OBJECTIVE IMPAIRMENTS decreased activity tolerance, decreased coordination, decreased mobility, decreased ROM, decreased strength, hypomobility, increased muscle spasms, impaired flexibility, impaired UE functional use, postural dysfunction, and pain.  ACTIVITY LIMITATIONS carrying, reach  over head, and locomotion level   PARTICIPATION LIMITATIONS: meal prep, community activity, and occupation   [place] are also affecting patient's functional outcome.    REHAB POTENTIAL: Excellent   CLINICAL DECISION MAKING: Stable/uncomplicated   EVALUATION COMPLEXITY: Low     GOALS: Goals reviewed with patient? Yes   SHORT TERM GOALS: Target date: 07/05/2022    Pt will be indep with initial HEP to assist with pain management Baseline: initiated at eval Goal status: MET   2.  Pt will report 25% improvement in L shoulder symptoms with functional activities at work Baseline: 100% present 7/26: 25% improvement Goal status: MET     LONG TERM GOALS: Target date:  07/19/22   Pt will be able to wipe down the scales with a circular motion at work without pain Baseline: 8/10 Goal status: INITIAL   2.  Pt will be able to perform left lateral cervical sidebend at work without pain Baseline: 10/10 Goal status: INITIAL   3.  Pt will be able to mop/sweep without pain Baseline: 6/10 Goal status: INITIAL   4.  Pt will have improved FOTO score to >/=57% function Baseline: 40% function Goal status: INITIAL   5.  Pt will demo improvement in L shoulder strength to >/= 4+/5 to assist with work duties Baseline: 3+/5 throughout except mid trap/teres 2-/5 Goal status: INITIAL         PLAN: PT FREQUENCY: 2x/week   PT DURATION: other: 6 weeks   PLANNED INTERVENTIONS: Therapeutic exercises, Therapeutic activity, Neuromuscular re-education, Patient/Family education, Joint mobilization, Dry Needling, Moist heat, Taping, Ultrasound, Manual therapy, and Re-evaluation   PLAN FOR NEXT SESSION:  L clavicular/scapular mobs, manual therapy for L Upper Trap/Teres/Mid trap tightness; strengthening L shoulder, dry needling   Kevan Ny Yoshino Broccoli PT 07/02/2022, 9:19 AM

## 2022-07-01 NOTE — Telephone Encounter (Signed)
Order has been placed.

## 2022-07-03 ENCOUNTER — Other Ambulatory Visit: Payer: Self-pay | Admitting: Adult Health

## 2022-07-03 ENCOUNTER — Ambulatory Visit: Payer: 59

## 2022-07-03 DIAGNOSIS — R293 Abnormal posture: Secondary | ICD-10-CM | POA: Diagnosis not present

## 2022-07-03 DIAGNOSIS — M542 Cervicalgia: Secondary | ICD-10-CM

## 2022-07-03 DIAGNOSIS — M25512 Pain in left shoulder: Secondary | ICD-10-CM | POA: Diagnosis not present

## 2022-07-03 DIAGNOSIS — M9901 Segmental and somatic dysfunction of cervical region: Secondary | ICD-10-CM | POA: Diagnosis not present

## 2022-07-03 DIAGNOSIS — M9902 Segmental and somatic dysfunction of thoracic region: Secondary | ICD-10-CM | POA: Diagnosis not present

## 2022-07-03 DIAGNOSIS — E2839 Other primary ovarian failure: Secondary | ICD-10-CM

## 2022-07-03 DIAGNOSIS — M9908 Segmental and somatic dysfunction of rib cage: Secondary | ICD-10-CM | POA: Diagnosis not present

## 2022-07-03 NOTE — Therapy (Signed)
OUTPATIENT PHYSICAL THERAPY TREATMENT NOTE   Patient Name: Donna Park MRN: [id number] DOB:Jan 23, 1971, 51 y.o., female Today's Date: 07/03/2022  PCP: Dorothyann Peng REFERRING PROVIDER: [name]   PT End of Session - 07/03/22 [place]     Visit Number 6    Number of Visits 10    Date for PT Re-Evaluation 07/19/22    Authorization Type UMR    Progress Note Due on Visit 10    PT Start Time 9924    PT Stop Time 1913   5 minutes of dry needle insertion during treatment.   PT Time Calculation (min) 43 min    Activity Tolerance Patient tolerated treatment well;No increased pain    Behavior During Therapy WFL for tasks assessed/performed               Past Medical History:  Diagnosis Date   Allergy    Anxiety    Arthritis    Asthma    Chicken pox    Complication of anesthesia    slow to wake up from anesthesia   Depression    DJD (degenerative joint disease)    GERD (gastroesophageal reflux disease)    resolved after gastric surgery   H/O gastric bypass 2016   History of iron deficiency anemia    HSV infection    Migraines    Obese    Pneumonia    Childhood history   PONV (postoperative nausea and vomiting)    Status post dilation of esophageal narrowing    Varicose vein of leg    Past Surgical History:  Procedure Laterality Date   ANKLE FRACTURE SURGERY Left 03/08/2009   BREAST SURGERY Bilateral    Cyst Removal   GASTRIC BYPASS  06/18/2015   LAPAROSCOPIC VAGINAL HYSTERECTOMY WITH SALPINGECTOMY Bilateral 05/15/2020   Procedure: LAPAROSCOPIC ASSISTED VAGINAL HYSTERECTOMY WITH SALPINGECTOMY;  Surgeon: Louretta Shorten, MD;  Location: [facility];  Service: Gynecology;  Laterality: Bilateral;  need bed   LASER ABLATION     Varicose veins   TONSILLECTOMY AND ADENOIDECTOMY  2001   TUBAL LIGATION  12/19/1999   UPPER GASTROINTESTINAL ENDOSCOPY     UPPER GI ENDOSCOPY     Uterine ablation  12/2018   Patient Active Problem List   Diagnosis Date  Noted   LUQ pain 03/01/2022   Change in bowel habits 03/01/2022   Loss of weight 03/01/2022   Chronic migraine without aura without status migrainosus, not intractable 06/14/2021   S/P laparoscopic assisted vaginal hysterectomy (LAVH) 05/15/2020   Migraines    GERD (gastroesophageal reflux disease)    Asthma    Seasonal and perennial allergic rhinitis 02/10/2019   Moderate persistent asthma, uncomplicated 26/83/4196   Anaphylactic shock due to adverse food reaction 02/10/2019   Atopic dermatitis 02/10/2019   History of Roux-en-Y gastric bypass 2016    THERAPY DIAG:  Cervicalgia  Abnormal posture  REFERRING DIAG:  M54.2 (ICD-10-CM) - Neck pain  M99.01 (ICD-10-CM) - Somatic dysfunction of cervical region  M99.02 (ICD-10-CM) - Somatic dysfunction of thoracic region  M99.08 (ICD-10-CM) - Somatic dysfunction of rib region  M25.512 (ICD-10-CM) - Acute pain of left shoulder    PERTINENT HISTORY: repeat trigger point injections and has received OMT treatments with last being 05/23/22 by Glennon Mac, DO.  PRECAUTIONS/RESTRICTIONS:   No  SUBJECTIVE:  Pt reports that she was doing well all day today, although she began having pain at work about an hour ago. She reports her shoulder is feeling slightly better overall.  She  reports that TPDN has been very helpful.   Pain: 6/10 L shoulder and periscapular pain Aggs: shoulder abd Eases: rest  OBJECTIVE:  DIAGNOSTIC FINDINGS:  05/10/22 MRI: Mild disc herniations C4-5, C5-6, C6-7, most prominent at C6-7 without spinal cord impingement   PATIENT SURVEYS:  FOTO 40% function 07/03/2022: 32% function     COGNITION: Overall cognitive status: Within functional limits for tasks assessed     SENSATION: WFL   POSTURE:  sits with left lateral trunk lean, R shoulder heigher, R scapula lower, rounded shoulders bil, slight forward head, increased lumbar lordosis, L clavicle higher, L clavicular notch heigher, audible pop under L  acromion    PALPATION: T2, T4-5, T7-8 hypomobile, pt with TTP along L Upper Trap,mid Trap, Teres; see posture section, coracoid process/acromion heigher left   CERVICAL ROM:    Active ROM A/PROM (deg) eval  Flexion WFL  Extension WFL  Right lateral flexion WFL  Left lateral flexion WFL  Right rotation WFL  Left rotation WFL   (Blank rows = not tested)   UPPER EXTREMITY MMT:   MMT Right eval Left eval Left 07/03/2022  Shoulder flexion 4+/5 3+/5 with pain  4/5, p!  Shoulder extension       Shoulder abduction 4+/5 3+/5 4+/5  Shoulder adduction 4+/5 3+/5 4+/5, p!  Shoulder extension       Shoulder internal rotation 4+/5 3+/5 5/5  Shoulder external rotation 4+/5 3+/5 4+/5  Elbow flexion       Elbow extension       Wrist flexion       Wrist extension       Wrist ulnar deviation       Wrist radial deviation       Wrist pronation       Wrist supination        (Blank rows = not tested) mid-Trap,Teres 2-/5   UPPER EXTREMITY AROM:   MMT Right eval Left eval  Shoulder flexion [facility] with pain  Shoulder extension      Shoulder abduction [facility] WFL  Shoulder adduction [facility]  Shoulder extension      Shoulder internal rotation [facility] WFL  Shoulder external rotation WFL WFL                                                                                (Blank rows = not tested)   CERVICAL SPECIAL TESTS:  NT     TODAY'S TREATMENT:  At eval: see pt education section     PATIENT EDUCATION:  Education details: HEP, postural education, anatomy review Person educated: Patient Education method: Explanation, Demonstration, and Handouts Education comprehension: verbalized understanding and returned demonstration     HOME EXERCISE PROGRAM: Access Code: [email or web address] URL: [email or web address] Date: 06/07/2022 Prepared by: America Brown   Exercises - Standing Scapular Depression  - 2 x daily - 7 x weekly - 1 sets - 15 reps - Seated Scapular Retraction  -  2 x daily - 7 x weekly - 1 sets - 15 reps - Latissimus Dorsi Stretch at Wall  - 2 x daily - 7 x weekly - 1 sets - 2 reps - 30 sec hold  [facility] Adult PT Treatment:  DATE: 07/03/2022 Therapeutic Exercise: Seated low rows with 35# cable 2x10 Seated high rows with 35# cable 2x10 Seated lat pull-downs with 35# cable 2x10 Seated shoulder rolls 2x10 forward and backward Standing BIL shoulder scaption with 3# dumbbells 2x10 Manual Therapy: Skilled palpation to identify trigger points prior to TPDN Effleurage/ STM to all musculature following TPDN x10 minutes Trigger Point Dry-Needling  Treatment instructions: Expect mild to moderate muscle soreness. S/S of pneumothorax if dry needled over a lung field, and to seek immediate medical attention should they occur. Patient verbalized understanding of these instructions and education.  Patient Consent Given: Yes Education handout provided: No Muscles treated: BIL UT, BIL subscapularis, BIL C4-C5 cervical multifidi, BIL suboccipitals, BIL infraspinatus  Electrical stimulation performed: No Parameters: N/A Treatment response/outcome: Improved muscle extensibility, reported decrease in pain from 6/10 to 4/10  Neuromuscular re-ed: N/A Therapeutic Activity: N/A Modalities: N/A Self Care: N/A  TREATMENT 07/01/2022:  Therapeutic Exercise: - UBE 2.5'/2.5' fwd and backward for warm up while taking subjective - low row - 35# - 2x6 - high row - 35# - 2x6 - lat pull down - 35# - 2x8 - 5# OH press - 2x10 ea - S/L ER - 3x10 - L - 2#  Manual therapy: Skilled palpation to identify trigger points for TDN (needle insertion not charged) STM of bil sub occipitals, Bil cervical paraspinals, Bil infraspinatus, Bil supraspinatus, Bil UT, Bil LS  Trigger Point Dry-Needling  Treatment instructions: Expect mild to moderate muscle soreness. S/S of pneumothorax if dry needled over a lung field, and to seek immediate  medical attention should they occur. Patient verbalized understanding of these instructions and education.  Patient Consent Given: Yes Education handout provided: No Muscles treated: bil sub occipitals, bil cervical paraspinals, bil infraspinatus, bil supraspinatus, bil UT, bil subscap, bil LS Electrical stimulation performed: No Parameters: N/A Treatment response/outcome: twitch and pain relief  TREATMENT 06/24/2022:  Therapeutic Exercise: - UBE 2.5'/2.5' fwd and backward for warm up while taking subjective - low row - 30# - 2x6 - high row - 30# - 2x6 - lat pull down - 30# - 2x8 - 5# OH press - 2x10 ea - S/L ER - 3x10 - L    Manual therapy: Skilled palpation to identify trigger points for TDN (needle insertion not charged) STM of bil sub occipitals, L cervical paraspinals, L infraspinatus, L supraspinatus, L UT  Trigger Point Dry-Needling  Treatment instructions: Expect mild to moderate muscle soreness. S/S of pneumothorax if dry needled over a lung field, and to seek immediate medical attention should they occur. Patient verbalized understanding of these instructions and education.  Patient Consent Given: Yes Education handout provided: No Muscles treated: bil sub occipitals, L cervical paraspinals, L infraspinatus, L supraspinatus, L UT, L subscap Electrical stimulation performed: No Parameters: N/A Treatment response/outcome: twitch and pain relief      ASSESSMENT:   CLINICAL IMPRESSION: Pt responded well to all interventions today, demonstrating good form and no increase in pain with selected exercises. The pt again responded well to extensive parascapular/ cervical TPDN, reporting a decrease in pain and demonstrating improved muscle extensibility following treatment. Upon re-assessment of Lt shoulder strength, the pt has made excellent progress in all planes of motion, meeting her shoulder strength goal. She will continue to benefit from skilled PT to address her primary  impairments and return to her prior level of function with less limitation.     OBJECTIVE IMPAIRMENTS decreased activity tolerance, decreased coordination, decreased mobility, decreased ROM, decreased strength, hypomobility, increased muscle  spasms, impaired flexibility, impaired UE functional use, postural dysfunction, and pain.    ACTIVITY LIMITATIONS carrying, reach over head, and locomotion level   PARTICIPATION LIMITATIONS: meal prep, community activity, and occupation   [place] are also affecting patient's functional outcome.    REHAB POTENTIAL: Excellent   CLINICAL DECISION MAKING: Stable/uncomplicated   EVALUATION COMPLEXITY: Low     GOALS: Goals reviewed with patient? Yes   SHORT TERM GOALS: Target date: 07/05/2022    Pt will be indep with initial HEP to assist with pain management Baseline: initiated at eval Goal status: MET   2.  Pt will report 25% improvement in L shoulder symptoms with functional activities at work Baseline: 100% present 7/26: 25% improvement Goal status: MET     LONG TERM GOALS: Target date:  07/19/22   Pt will be able to wipe down the scales with a circular motion at work without pain Baseline: 8/10 Goal status: INITIAL   2.  Pt will be able to perform left lateral cervical sidebend at work without pain Baseline: 10/10 Goal status: INITIAL   3.  Pt will be able to mop/sweep without pain Baseline: 6/10 Goal status: INITIAL   4.  Pt will have improved FOTO score to >/=57% function Baseline: 40% function 07/03/2022: 32% Goal status: IN PROGRESS   5.  Pt will demo improvement in L shoulder strength to >/= 4+/5 to assist with work duties Baseline: 3+/5 throughout except mid trap/teres 2-/5 07/03/2022: 4+/5 Goal status: ACHIEVED         PLAN: PT FREQUENCY: 2x/week   PT DURATION: other: 6 weeks   PLANNED INTERVENTIONS: Therapeutic exercises, Therapeutic activity, Neuromuscular re-education, Patient/Family  education, Joint mobilization, Dry Needling, Moist heat, Taping, Ultrasound, Manual therapy, and Re-evaluation   PLAN FOR NEXT SESSION:  L clavicular/scapular mobs, manual therapy for L Upper Trap/Teres/Mid trap tightness; strengthening L shoulder, dry needling   Vanessa Waseca, PT, DPT 07/03/22 7:14 PM

## 2022-07-03 NOTE — Telephone Encounter (Signed)
Order was faxed off. Pt notified of update.

## 2022-07-04 ENCOUNTER — Other Ambulatory Visit (HOSPITAL_COMMUNITY): Payer: Self-pay

## 2022-07-14 ENCOUNTER — Other Ambulatory Visit (HOSPITAL_COMMUNITY): Payer: Self-pay

## 2022-07-15 ENCOUNTER — Ambulatory Visit: Payer: 59

## 2022-07-16 ENCOUNTER — Encounter (INDEPENDENT_AMBULATORY_CARE_PROVIDER_SITE_OTHER): Payer: Self-pay

## 2022-07-18 ENCOUNTER — Other Ambulatory Visit (HOSPITAL_COMMUNITY): Payer: Self-pay

## 2022-07-21 ENCOUNTER — Other Ambulatory Visit (HOSPITAL_COMMUNITY): Payer: Self-pay

## 2022-07-22 ENCOUNTER — Ambulatory Visit: Payer: 59 | Admitting: Neurology

## 2022-07-23 ENCOUNTER — Encounter: Payer: Self-pay | Admitting: Adult Health

## 2022-07-24 ENCOUNTER — Ambulatory Visit (INDEPENDENT_AMBULATORY_CARE_PROVIDER_SITE_OTHER): Payer: 59 | Admitting: *Deleted

## 2022-07-24 DIAGNOSIS — J4550 Severe persistent asthma, uncomplicated: Secondary | ICD-10-CM

## 2022-07-25 ENCOUNTER — Ambulatory Visit: Payer: 59 | Attending: Sports Medicine | Admitting: Physical Therapy

## 2022-07-25 ENCOUNTER — Encounter: Payer: Self-pay | Admitting: Physical Therapy

## 2022-07-25 DIAGNOSIS — M542 Cervicalgia: Secondary | ICD-10-CM | POA: Diagnosis not present

## 2022-07-25 DIAGNOSIS — R293 Abnormal posture: Secondary | ICD-10-CM | POA: Diagnosis not present

## 2022-07-25 NOTE — Therapy (Signed)
OUTPATIENT PHYSICAL THERAPY TREATMENT NOTE   Patient Name: Donna Park MRN: [id number] DOB:1971/01/03, 51 y.o., female Today's Date: 07/25/2022  PCP: Dorothyann Peng REFERRING PROVIDER: [name]   PT End of Session - 07/25/22 1123     Visit Number 7    Number of Visits 10    Date for PT Re-Evaluation 07/19/22    Authorization Type U[employer]    Progress Note Due on Visit 10    PT Start Time 1125    PT Stop Time 1205    PT Time Calculation (min) 40 min    Activity Tolerance Patient tolerated treatment well;No increased pain    Behavior During Therapy WFL for tasks assessed/performed               Past Medical History:  Diagnosis Date   Allergy    Anxiety    Arthritis    Asthma    Chicken pox    Complication of anesthesia    slow to wake up from anesthesia   Depression    DJD (degenerative joint disease)    GERD (gastroesophageal reflux disease)    resolved after gastric surgery   H/O gastric bypass 2016   History of iron deficiency anemia    HSV infection    Migraines    Obese    Pneumonia    Childhood history   PONV (postoperative nausea and vomiting)    Status post dilation of esophageal narrowing    Varicose vein of leg    Past Surgical History:  Procedure Laterality Date   ANKLE FRACTURE SURGERY Left 03/08/2009   BREAST SURGERY Bilateral    Cyst Removal   GASTRIC BYPASS  06/18/2015   LAPAROSCOPIC VAGINAL HYSTERECTOMY WITH SALPINGECTOMY Bilateral 05/15/2020   Procedure: LAPAROSCOPIC ASSISTED VAGINAL HYSTERECTOMY WITH SALPINGECTOMY;  Surgeon: Louretta Shorten, MD;  Location: [facility];  Service: Gynecology;  Laterality: Bilateral;  need bed   LASER ABLATION     Varicose veins   TONSILLECTOMY AND ADENOIDECTOMY  2001   TUBAL LIGATION  12/19/1999   UPPER GASTROINTESTINAL ENDOSCOPY     UPPER GI ENDOSCOPY     Uterine ablation  12/2018   Patient Active Problem List   Diagnosis Date Noted   LUQ pain 03/01/2022   Change in bowel habits  03/01/2022   Loss of weight 03/01/2022   Chronic migraine without aura without status migrainosus, not intractable 06/14/2021   S/P laparoscopic assisted vaginal hysterectomy (LAVH) 05/15/2020   Migraines    GERD (gastroesophageal reflux disease)    Asthma    Seasonal and perennial allergic rhinitis 02/10/2019   Moderate persistent asthma, uncomplicated 29/79/8921   Anaphylactic shock due to adverse food reaction 02/10/2019   Atopic dermatitis 02/10/2019   History of Roux-en-Y gastric bypass 2016    THERAPY DIAG:  Cervicalgia  Abnormal posture  REFERRING DIAG:  M54.2 (ICD-10-CM) - Neck pain  M99.01 (ICD-10-CM) - Somatic dysfunction of cervical region  M99.02 (ICD-10-CM) - Somatic dysfunction of thoracic region  M99.08 (ICD-10-CM) - Somatic dysfunction of rib region  M25.512 (ICD-10-CM) - Acute pain of left shoulder    PERTINENT HISTORY: repeat trigger point injections and has received OMT treatments with last being 05/23/22 by Glennon Mac, DO.  PRECAUTIONS/RESTRICTIONS:   No  SUBJECTIVE:  Pt reports that she was feeling pretty good before her trip.  However, after driving frequently and having difficulty doing exercises while traveling her pain has returned.  Pain: 7/10 L shoulder and periscapular pain Aggs: shoulder abd Eases: rest  OBJECTIVE:  DIAGNOSTIC FINDINGS:  05/10/22 MRI: Mild disc herniations C4-5, C5-6, C6-7, most prominent at C6-7 without spinal cord impingement   PATIENT SURVEYS:  FOTO 40% function 07/03/2022: 32% function     COGNITION: Overall cognitive status: Within functional limits for tasks assessed     SENSATION: WFL   POSTURE:  sits with left lateral trunk lean, R shoulder heigher, R scapula lower, rounded shoulders bil, slight forward head, increased lumbar lordosis, L clavicle higher, L clavicular notch heigher, audible pop under L acromion    PALPATION: T2, T4-5, T7-8 hypomobile, pt with TTP along L Upper Trap,mid Trap, Teres; see  posture section, coracoid process/acromion heigher left   CERVICAL ROM:    Active ROM A/PROM (deg) eval  Flexion WFL  Extension WFL  Right lateral flexion WFL  Left lateral flexion WFL  Right rotation WFL  Left rotation WFL   (Blank rows = not tested)   UPPER EXTREMITY MMT:   MMT Right eval Left eval Left 07/03/2022  Shoulder flexion 4+/5 3+/5 with pain  4/5, p!  Shoulder extension       Shoulder abduction 4+/5 3+/5 4+/5  Shoulder adduction 4+/5 3+/5 4+/5, p!  Shoulder extension       Shoulder internal rotation 4+/5 3+/5 5/5  Shoulder external rotation 4+/5 3+/5 4+/5  Elbow flexion       Elbow extension       Wrist flexion       Wrist extension       Wrist ulnar deviation       Wrist radial deviation       Wrist pronation       Wrist supination        (Blank rows = not tested) mid-Trap,Teres 2-/5   UPPER EXTREMITY AROM:   MMT Right eval Left eval  Shoulder flexion [facility] with pain  Shoulder extension      Shoulder abduction [facility] WFL  Shoulder adduction [facility]  Shoulder extension      Shoulder internal rotation [facility] WFL  Shoulder external rotation WFL WFL                                                                                (Blank rows = not tested)   CERVICAL SPECIAL TESTS:  NT     TODAY'S TREATMENT:  At eval: see pt education section     PATIENT EDUCATION:  Education details: HEP, postural education, anatomy review Person educated: Patient Education method: Explanation, Demonstration, and Handouts Education comprehension: verbalized understanding and returned demonstration     HOME EXERCISE PROGRAM: Access Code: [email or web address] URL: [email or web address] Date: 06/07/2022 Prepared by: Donna Park   Exercises - Standing Scapular Depression  - 2 x daily - 7 x weekly - 1 sets - 15 reps - Seated Scapular Retraction  - 2 x daily - 7 x weekly - 1 sets - 15 reps - Latissimus Dorsi Stretch at Wall  - 2 x daily - 7 x weekly -  1 sets - 2 reps - 30 sec hold   [facility] Adult PT Treatment:  DATE: 07/25/2022 Therapeutic Exercise: UBE 2.5'/2.5' fwd and backward for warm up while taking subjective Seated low rows with 40# cable 2x10 Seated high rows with 40# cable 2x10 Seated lat pull-downs with 40# cable 2x10 Standing BIL shoulder scaption with 3# dumbbells 2x10  Manual Therapy: Skilled palpation to identify trigger points prior to TPDN Effleurage/ STM to all musculature following TPDN   Trigger Point Dry-Needling  Treatment instructions: Expect mild to moderate muscle soreness. S/S of pneumothorax if dry needled over a lung field, and to seek immediate medical attention should they occur. Patient verbalized understanding of these instructions and education.  Patient Consent Given: Yes Education handout provided: No Muscles treated: BIL UT, BIL subscapularis, BIL C4-C5 cervical multifidi, BIL suboccipitals, BIL infraspinatus  Electrical stimulation performed: No Parameters: N/A Treatment response/outcome: Improved muscle extensibility  [facility] Adult PT Treatment:                                                DATE: 07/03/2022 Therapeutic Exercise: Seated low rows with 35# cable 2x10 Seated high rows with 35# cable 2x10 Seated lat pull-downs with 35# cable 2x10 Seated shoulder rolls 2x10 forward and backward Standing BIL shoulder scaption with 3# dumbbells 2x10 Manual Therapy: Skilled palpation to identify trigger points prior to TPDN Effleurage/ STM to all musculature following TPDN x10 minutes Trigger Point Dry-Needling  Treatment instructions: Expect mild to moderate muscle soreness. S/S of pneumothorax if dry needled over a lung field, and to seek immediate medical attention should they occur. Patient verbalized understanding of these instructions and education.  Patient Consent Given: Yes Education handout provided: No Muscles treated: BIL UT, BIL subscapularis,  BIL C4-C5 cervical multifidi, BIL suboccipitals, BIL infraspinatus  Electrical stimulation performed: No Parameters: N/A Treatment response/outcome: Improved muscle extensibility, reported decrease in pain from 6/10 to 4/10  Neuromuscular re-ed: N/A Therapeutic Activity: N/A Modalities: N/A Self Care: N/A  TREATMENT 07/01/2022:  Therapeutic Exercise: - UBE 2.5'/2.5' fwd and backward for warm up while taking subjective - low row - 35# - 2x6 - high row - 35# - 2x6 - lat pull down - 35# - 2x8 - 5# OH press - 2x10 ea - S/L ER - 3x10 - L - 2#  Manual therapy: Skilled palpation to identify trigger points for TDN (needle insertion not charged) STM of bil sub occipitals, Bil cervical paraspinals, Bil infraspinatus, Bil supraspinatus, Bil UT, Bil LS  Trigger Point Dry-Needling  Treatment instructions: Expect mild to moderate muscle soreness. S/S of pneumothorax if dry needled over a lung field, and to seek immediate medical attention should they occur. Patient verbalized understanding of these instructions and education.  Patient Consent Given: Yes Education handout provided: No Muscles treated: bil sub occipitals, bil cervical paraspinals, bil infraspinatus, bil supraspinatus, bil UT, bil subscap, bil LS Electrical stimulation performed: No Parameters: N/A Treatment response/outcome: twitch and pain relief  TREATMENT 06/24/2022:  Therapeutic Exercise: - UBE 2.5'/2.5' fwd and backward for warm up while taking subjective - low row - 30# - 2x6 - high row - 30# - 2x6 - lat pull down - 30# - 2x8 - 5# OH press - 2x10 ea - S/L ER - 3x10 - L    Manual therapy: Skilled palpation to identify trigger points for TDN (needle insertion not charged) STM of bil sub occipitals, L cervical paraspinals, L infraspinatus, L supraspinatus, L UT  Trigger Point  Dry-Needling  Treatment instructions: Expect mild to moderate muscle soreness. S/S of pneumothorax if dry needled over a lung field, and  to seek immediate medical attention should they occur. Patient verbalized understanding of these instructions and education.  Patient Consent Given: Yes Education handout provided: No Muscles treated: bil sub occipitals, L cervical paraspinals, L infraspinatus, L supraspinatus, L UT, L subscap Electrical stimulation performed: No Parameters: N/A Treatment response/outcome: twitch and pain relief      ASSESSMENT:   CLINICAL IMPRESSION: Markell tolerated session well with no adverse reaction.  I did hit a superficial vein during TDN of L sided paraspinals and small hematoma formed.  Bleeding stopped quickly with no lasting pain.  Emilyann was able to increase load with periscapular exercises today and reports continued significant reduction in pain following manual therapy and TDN.     OBJECTIVE IMPAIRMENTS decreased activity tolerance, decreased coordination, decreased mobility, decreased ROM, decreased strength, hypomobility, increased muscle spasms, impaired flexibility, impaired UE functional use, postural dysfunction, and pain.    ACTIVITY LIMITATIONS carrying, reach over head, and locomotion level   PARTICIPATION LIMITATIONS: meal prep, community activity, and occupation   [place] are also affecting patient's functional outcome.    REHAB POTENTIAL: Excellent   CLINICAL DECISION MAKING: Stable/uncomplicated   EVALUATION COMPLEXITY: Low     GOALS: Goals reviewed with patient? Yes   SHORT TERM GOALS: Target date: 07/05/2022    Pt will be indep with initial HEP to assist with pain management Baseline: initiated at eval Goal status: MET   2.  Pt will report 25% improvement in L shoulder symptoms with functional activities at work Baseline: 100% present 7/26: 25% improvement Goal status: MET     LONG TERM GOALS: Target date:  07/19/22   Pt will be able to wipe down the scales with a circular motion at work without pain Baseline: 8/10 Goal status: INITIAL    2.  Pt will be able to perform left lateral cervical sidebend at work without pain Baseline: 10/10 Goal status: INITIAL   3.  Pt will be able to mop/sweep without pain Baseline: 6/10 Goal status: INITIAL   4.  Pt will have improved FOTO score to >/=57% function Baseline: 40% function 07/03/2022: 32% Goal status: IN PROGRESS   5.  Pt will demo improvement in L shoulder strength to >/= 4+/5 to assist with work duties Baseline: 3+/5 throughout except mid trap/teres 2-/5 07/03/2022: 4+/5 Goal status: ACHIEVED         PLAN: PT FREQUENCY: 2x/week   PT DURATION: other: 6 weeks   PLANNED INTERVENTIONS: Therapeutic exercises, Therapeutic activity, Neuromuscular re-education, Patient/Family education, Joint mobilization, Dry Needling, Moist heat, Taping, Ultrasound, Manual therapy, and Re-evaluation   PLAN FOR NEXT SESSION:  L clavicular/scapular mobs, manual therapy for L Upper Trap/Teres/Mid trap tightness; strengthening L shoulder, dry needling   Kevan Ny Simmone [place] PT 07/25/22 12:08 PM

## 2022-07-26 ENCOUNTER — Other Ambulatory Visit: Payer: Self-pay | Admitting: Adult Health

## 2022-07-26 ENCOUNTER — Other Ambulatory Visit: Payer: Self-pay

## 2022-07-26 ENCOUNTER — Other Ambulatory Visit: Payer: Self-pay | Admitting: Neurology

## 2022-07-26 ENCOUNTER — Encounter: Payer: Self-pay | Admitting: Rehabilitative and Restorative Service Providers"

## 2022-07-26 ENCOUNTER — Other Ambulatory Visit: Payer: Self-pay | Admitting: Allergy & Immunology

## 2022-07-26 ENCOUNTER — Ambulatory Visit: Payer: 59 | Admitting: Rehabilitative and Restorative Service Providers"

## 2022-07-26 DIAGNOSIS — M542 Cervicalgia: Secondary | ICD-10-CM

## 2022-07-26 DIAGNOSIS — R293 Abnormal posture: Secondary | ICD-10-CM

## 2022-07-26 DIAGNOSIS — G43919 Migraine, unspecified, intractable, without status migrainosus: Secondary | ICD-10-CM

## 2022-07-26 DIAGNOSIS — J4541 Moderate persistent asthma with (acute) exacerbation: Secondary | ICD-10-CM

## 2022-07-26 NOTE — Therapy (Signed)
OUTPATIENT PHYSICAL THERAPY TREATMENT NOTE/RE-EVAL   Patient Name: Donna Park MRN: [id number] DOB:29-Jul-1971, 51 y.o., female Today's Date: 07/26/2022  PCP: Dorothyann Peng REFERRING PROVIDER: [name]   PT End of Session - 07/26/22 1131     Visit Number 8    Number of Visits 10    Date for PT Re-Evaluation 09/06/22    PT Start Time 8022    PT Stop Time 1212    PT Time Calculation (min) 48 min    Activity Tolerance Patient tolerated treatment well;No increased pain    Behavior During Therapy WFL for tasks assessed/performed                Past Medical History:  Diagnosis Date   Allergy    Anxiety    Arthritis    Asthma    Chicken pox    Complication of anesthesia    slow to wake up from anesthesia   Depression    DJD (degenerative joint disease)    GERD (gastroesophageal reflux disease)    resolved after gastric surgery   H/O gastric bypass 2016   History of iron deficiency anemia    HSV infection    Migraines    Obese    Pneumonia    Childhood history   PONV (postoperative nausea and vomiting)    Status post dilation of esophageal narrowing    Varicose vein of leg    Past Surgical History:  Procedure Laterality Date   ANKLE FRACTURE SURGERY Left 03/08/2009   BREAST SURGERY Bilateral    Cyst Removal   GASTRIC BYPASS  06/18/2015   LAPAROSCOPIC VAGINAL HYSTERECTOMY WITH SALPINGECTOMY Bilateral 05/15/2020   Procedure: LAPAROSCOPIC ASSISTED VAGINAL HYSTERECTOMY WITH SALPINGECTOMY;  Surgeon: Louretta Shorten, MD;  Location: [facility];  Service: Gynecology;  Laterality: Bilateral;  need bed   LASER ABLATION     Varicose veins   TONSILLECTOMY AND ADENOIDECTOMY  2001   TUBAL LIGATION  12/19/1999   UPPER GASTROINTESTINAL ENDOSCOPY     UPPER GI ENDOSCOPY     Uterine ablation  12/2018   Patient Active Problem List   Diagnosis Date Noted   LUQ pain 03/01/2022   Change in bowel habits 03/01/2022   Loss of weight 03/01/2022   Chronic  migraine without aura without status migrainosus, not intractable 06/14/2021   S/P laparoscopic assisted vaginal hysterectomy (LAVH) 05/15/2020   Migraines    GERD (gastroesophageal reflux disease)    Asthma    Seasonal and perennial allergic rhinitis 02/10/2019   Moderate persistent asthma, uncomplicated 33/61/2244   Anaphylactic shock due to adverse food reaction 02/10/2019   Atopic dermatitis 02/10/2019   History of Roux-en-Y gastric bypass 2016    THERAPY DIAG:  Cervicalgia  Abnormal posture  REFERRING DIAG:  M54.2 (ICD-10-CM) - Neck pain  M99.01 (ICD-10-CM) - Somatic dysfunction of cervical region  M99.02 (ICD-10-CM) - Somatic dysfunction of thoracic region  M99.08 (ICD-10-CM) - Somatic dysfunction of rib region  M25.512 (ICD-10-CM) - Acute pain of left shoulder    PERTINENT HISTORY: repeat trigger point injections and has received OMT treatments with last being 05/23/22 by Glennon Mac, DO.  PRECAUTIONS/RESTRICTIONS:   No  SUBJECTIVE:  I had dry needling yesterday and a 90 min massage.   Pain: 7/10 L shoulder and periscapular pain Aggs: shoulder abd/ext combo Eases: rest  OBJECTIVE:  DIAGNOSTIC FINDINGS:  05/10/22 MRI: Mild disc herniations C4-5, C5-6, C6-7, most prominent at C6-7 without spinal cord impingement   PATIENT SURVEYS:  FOTO 40% function  07/03/2022: 32% function     COGNITION: Overall cognitive status: Within functional limits for tasks assessed     SENSATION: WFL   POSTURE:  sits with left lateral trunk lean, R shoulder heigher, R scapula lower, rounded shoulders bil, slight forward head, increased lumbar lordosis, L clavicle higher, L clavicular notch heigher, audible pop under L acromion    PALPATION: T2, T4-5, T7-8 hypomobile, pt with TTP along L Upper Trap,mid Trap, Teres; see posture section, coracoid process/acromion heigher left   CERVICAL ROM:    Active ROM A/PROM (deg) eval  Flexion WFL  Extension WFL  Right lateral  flexion WFL  Left lateral flexion WFL  Right rotation WFL  Left rotation WFL   (Blank rows = not tested)   UPPER EXTREMITY MMT:   MMT Right eval Left eval Left 07/03/2022 07/26/22  Shoulder flexion 4+/5 3+/5 with pain  4/5, p!  Shoulder extension       Shoulder abduction 4+/5 3+/5 4+/5  Shoulder adduction 4+/5 3+/5 4+/5, p!  Shoulder extension       Shoulder internal rotation 4+/5 3+/5 5/5  Shoulder external rotation 4+/5 3+/5 4+/5  Elbow flexion       Elbow extension       Wrist flexion       Wrist extension       Wrist ulnar deviation       Wrist radial deviation       Wrist pronation       Wrist supination        (Blank rows = not tested) mid-Trap,Teres 2-/5   UPPER EXTREMITY AROM:   MMT Right eval Left eval  Shoulder flexion [facility] with pain  Shoulder extension      Shoulder abduction [facility] WFL  Shoulder adduction [facility]  Shoulder extension      Shoulder internal rotation [facility] WFL  Shoulder external rotation WFL WFL                                                                                (Blank rows = not tested)   CERVICAL SPECIAL TESTS:  NT     TODAY'S TREATMENT:  At eval: see pt education section     PATIENT EDUCATION:  Education details: HEP, postural education, anatomy review Person educated: Patient Education method: Explanation, Demonstration, and Handouts Education comprehension: verbalized understanding and returned demonstration     HOME EXERCISE PROGRAM: Access Code: [email or web address] URL: [email or web address] Date: 06/07/2022 Prepared by: America Brown   Exercises - Standing Scapular Depression  - 2 x daily - 7 x weekly - 1 sets - 15 reps - Seated Scapular Retraction  - 2 x daily - 7 x weekly - 1 sets - 15 reps - Latissimus Dorsi Stretch at Wall  - 2 x daily - 7 x weekly - 1 sets - 2 reps - 30 sec hold  [facility] Adult PT Treatment:                                                DATE: 07/26/22 Therapeutic  Exercise: NuStep level 4 x 5 min for warm up while taking subjective and monitoring for muscle substitution Seated low rows with 35# x 20 Seated high rows with 35# x 20 Seated lat pull-downs with 40# cable 2x10 Standing BIL shoulder scaption with 3# dumbbells 2x10 Freemotion L ER 3 lbs x 20 Freemotion row 20 lbs x 10, 17 lbs x 10 L  Freemotion L shoulder ext 3 lbs x 20 Seated sidebend stretch 2x30 sec each side L reverse fly 5 lbs x 10 with PT max verbal cues for technique GTB bil reverse fly/ext combo x 20 near hips L scapular protraction stretch 2x30 sec    [facility] Adult PT Treatment:                                                DATE: 07/25/2022 Therapeutic Exercise: UBE 2.5'/2.5' fwd and backward for warm up while taking subjective Seated low rows with 40# cable 2x10 Seated high rows with 40# cable 2x10 Seated lat pull-downs with 40# cable 2x10 Standing BIL shoulder scaption with 3# dumbbells 2x10  Manual Therapy: Skilled palpation to identify trigger points prior to TPDN Effleurage/ STM to all musculature following TPDN   Trigger Point Dry-Needling  Treatment instructions: Expect mild to moderate muscle soreness. S/S of pneumothorax if dry needled over a lung field, and to seek immediate medical attention should they occur. Patient verbalized understanding of these instructions and education.  Patient Consent Given: Yes Education handout provided: No Muscles treated: BIL UT, BIL subscapularis, BIL C4-C5 cervical multifidi, BIL suboccipitals, BIL infraspinatus  Electrical stimulation performed: No Parameters: N/A Treatment response/outcome: Improved muscle extensibility  [facility] Adult PT Treatment:                                                DATE: 07/03/2022 Therapeutic Exercise: Seated low rows with 35# cable 2x10 Seated high rows with 35# cable 2x10 Seated lat pull-downs with 35# cable 2x10 Seated shoulder rolls 2x10 forward and backward Standing BIL shoulder scaption  with 3# dumbbells 2x10 Manual Therapy: Skilled palpation to identify trigger points prior to TPDN Effleurage/ STM to all musculature following TPDN x10 minutes Trigger Point Dry-Needling  Treatment instructions: Expect mild to moderate muscle soreness. S/S of pneumothorax if dry needled over a lung field, and to seek immediate medical attention should they occur. Patient verbalized understanding of these instructions and education.  Patient Consent Given: Yes Education handout provided: No Muscles treated: BIL UT, BIL subscapularis, BIL C4-C5 cervical multifidi, BIL suboccipitals, BIL infraspinatus  Electrical stimulation performed: No Parameters: N/A Treatment response/outcome: Improved muscle extensibility, reported decrease in pain from 6/10 to 4/10  Neuromuscular re-ed: N/A Therapeutic Activity: N/A Modalities: N/A Self Care: N/A  TREATMENT 07/01/2022:  Therapeutic Exercise: - UBE 2.5'/2.5' fwd and backward for warm up while taking subjective - low row - 35# - 2x6 - high row - 35# - 2x6 - lat pull down - 35# - 2x8 - 5# OH press - 2x10 ea - S/L ER - 3x10 - L - 2#  Manual therapy: Skilled palpation to identify trigger points for TDN (needle insertion not charged) STM of bil sub occipitals, Bil cervical paraspinals, Bil infraspinatus, Bil supraspinatus, Bil UT, Bil LS  Trigger Point  Dry-Needling  Treatment instructions: Expect mild to moderate muscle soreness. S/S of pneumothorax if dry needled over a lung field, and to seek immediate medical attention should they occur. Patient verbalized understanding of these instructions and education.  Patient Consent Given: Yes Education handout provided: No Muscles treated: bil sub occipitals, bil cervical paraspinals, bil infraspinatus, bil supraspinatus, bil UT, bil subscap, bil LS Electrical stimulation performed: No Parameters: N/A Treatment response/outcome: twitch and pain relief  TREATMENT 06/24/2022:  Therapeutic  Exercise: - UBE 2.5'/2.5' fwd and backward for warm up while taking subjective - low row - 30# - 2x6 - high row - 30# - 2x6 - lat pull down - 30# - 2x8 - 5# OH press - 2x10 ea - S/L ER - 3x10 - L    Manual therapy: Skilled palpation to identify trigger points for TDN (needle insertion not charged) STM of bil sub occipitals, L cervical paraspinals, L infraspinatus, L supraspinatus, L UT  Trigger Point Dry-Needling  Treatment instructions: Expect mild to moderate muscle soreness. S/S of pneumothorax if dry needled over a lung field, and to seek immediate medical attention should they occur. Patient verbalized understanding of these instructions and education.  Patient Consent Given: Yes Education handout provided: No Muscles treated: bil sub occipitals, L cervical paraspinals, L infraspinatus, L supraspinatus, L UT, L subscap Electrical stimulation performed: No Parameters: N/A Treatment response/outcome: twitch and pain relief      ASSESSMENT:   CLINICAL IMPRESSION: Braleigh continues to report L cervical pain specifically along L Upper Trap/Levator/Rhomboid area and posterior L shoulder with horizontal abduction. Clavicular mobility has improved. She has had dry needling, manual therapy, and therex performed to assist with cervical pain management. She has difficulty with bil shoulder movement without cervical compensation. PT stressed pt necessity to relearn UE movements without involving cervical musculature. Pt also states that she will be working from home end of September and that she has a sit to stand desk on order; pt may also benefit from a chair with lumbar support to assist with ergonomic posture for an entire workday. Pt reports 25% improvement with pain with functional activity. Pt would continue to benefit from PT to address L cervical pain, posture, and postural weakness to improve painfree function.    OBJECTIVE IMPAIRMENTS decreased activity tolerance, decreased  coordination, decreased mobility, decreased ROM, decreased strength, hypomobility, increased muscle spasms, impaired flexibility, impaired UE functional use, postural dysfunction, and pain.    ACTIVITY LIMITATIONS carrying, reach over head, and locomotion level   PARTICIPATION LIMITATIONS: meal prep, community activity, and occupation   [place] are also affecting patient's functional outcome.    REHAB POTENTIAL: Excellent   CLINICAL DECISION MAKING: Stable/uncomplicated   EVALUATION COMPLEXITY: Low     GOALS: Goals reviewed with patient? Yes   SHORT TERM GOALS: Target date: 07/05/2022    Pt will be indep with initial HEP to assist with pain management Baseline: initiated at eval Goal status: MET   2.  Pt will report 25% improvement in L shoulder symptoms with functional activities at work Baseline: 100% present 7/26: 25% improvement Goal status: MET     LONG TERM GOALS: Target date:  09/06/22   Pt will be able to wipe down the scales with a circular motion at work without pain Baseline: 8/10; 07/26/22: 6/10 Goal status: ONGOING   2.  Pt will be able to perform left lateral cervical sidebend at work without pain Baseline: 10/10; 07/26/22: 5/10 Goal status: ONGOING   3.  Pt will be  able to mop/sweep without pain Baseline: 6/10; 07/26/22: 7-8/10 Goal status: ONGOING   4.  Pt will have improved FOTO score to >/=57% function Baseline: 40% function 07/03/2022: 32% Goal status: ONGOING   5.  Pt will demo improvement in L shoulder strength to >/= 4+/5 to assist with work duties Baseline: 3+/5 throughout except mid trap/teres 2-/5 07/03/2022: 4+/5 Goal status: ACHIEVED         PLAN: PT FREQUENCY: 2x/week   PT DURATION: other: 6 weeks   PLANNED INTERVENTIONS: Therapeutic exercises, Therapeutic activity, Neuromuscular re-education, Patient/Family education, Joint mobilization, Dry Needling, Moist heat, Taping, Ultrasound, Manual therapy, and  Re-evaluation   PLAN FOR NEXT SESSION:  L clavicular/scapular mobs, manual therapy for L Upper Trap/Teres/Mid trap tightness; strengthening L shoulder, dry needling   Erenest Rasher PT 07/26/22 12:26 PM

## 2022-07-28 ENCOUNTER — Other Ambulatory Visit (HOSPITAL_COMMUNITY): Payer: Self-pay

## 2022-07-28 MED ORDER — EMGALITY 120 MG/ML ~~LOC~~ SOAJ
120.0000 mg | SUBCUTANEOUS | 0 refills | Status: DC
  Filled 2022-07-28: qty 3, 90d supply, fill #0

## 2022-07-28 MED ORDER — ALBUTEROL SULFATE HFA 108 (90 BASE) MCG/ACT IN AERS
1.0000 | INHALATION_SPRAY | Freq: Four times a day (QID) | RESPIRATORY_TRACT | 0 refills | Status: DC | PRN
  Filled 2022-07-28: qty 20.1, 75d supply, fill #0

## 2022-07-29 ENCOUNTER — Encounter: Payer: Self-pay | Admitting: *Deleted

## 2022-07-29 ENCOUNTER — Telehealth: Payer: Self-pay | Admitting: *Deleted

## 2022-07-29 ENCOUNTER — Other Ambulatory Visit (HOSPITAL_COMMUNITY): Payer: Self-pay

## 2022-07-29 MED ORDER — IBUPROFEN 800 MG PO TABS
800.0000 mg | ORAL_TABLET | Freq: Three times a day (TID) | ORAL | 0 refills | Status: DC | PRN
  Filled 2022-07-29: qty 270, 90d supply, fill #0

## 2022-07-29 MED ORDER — ACYCLOVIR 5 % EX OINT
1.0000 | TOPICAL_OINTMENT | CUTANEOUS | 1 refills | Status: DC
Start: 2022-07-29 — End: 2022-10-20
  Filled 2022-07-29: qty 15, 7d supply, fill #0
  Filled 2022-09-26: qty 15, 7d supply, fill #1

## 2022-07-29 NOTE — Telephone Encounter (Signed)
Donna Park Key: [id number] - PA Case ID: [id number]   PA [employer] complete waiting on approval

## 2022-07-29 NOTE — Telephone Encounter (Signed)
Approvedtoday The request has been approved. The authorization is effective for a maximum of 6 fills from 07/29/2022 to 01/28/2023, as long as the member is enrolled in their current health plan. The request was approved with a quantity restriction. This has been approved for a max daily dosage of 0.6. A written notification letter will follow with additional details.   Will mychart patient to make her aware of approval

## 2022-07-31 ENCOUNTER — Encounter: Payer: Self-pay | Admitting: *Deleted

## 2022-08-02 ENCOUNTER — Encounter: Payer: Self-pay | Admitting: Adult Health

## 2022-08-02 ENCOUNTER — Encounter: Payer: Self-pay | Admitting: Physical Therapy

## 2022-08-02 ENCOUNTER — Ambulatory Visit: Payer: 59 | Admitting: Physical Therapy

## 2022-08-02 ENCOUNTER — Other Ambulatory Visit: Payer: Self-pay

## 2022-08-02 DIAGNOSIS — R293 Abnormal posture: Secondary | ICD-10-CM

## 2022-08-02 DIAGNOSIS — M542 Cervicalgia: Secondary | ICD-10-CM

## 2022-08-02 NOTE — Therapy (Signed)
OUTPATIENT PHYSICAL THERAPY TREATMENT NOTE   Patient Name: Donna Park MRN: [id number] DOB:Nov 15, 1971, 51 y.o., female Today's Date: 08/02/2022  PCP: Dorothyann Peng REFERRING PROVIDER: [name]   PT End of Session - 08/02/22 1036     Visit Number 9    Number of Visits 10    Date for PT Re-Evaluation 09/06/22    Authorization Type [employer]    Progress Note Due on Visit 10    PT Start Time 1030    PT Stop Time 1115    PT Time Calculation (min) 45 min    Activity Tolerance Patient tolerated treatment well    Behavior During Therapy WFL for tasks assessed/performed                 Past Medical History:  Diagnosis Date   Allergy    Anxiety    Arthritis    Asthma    Chicken pox    Complication of anesthesia    slow to wake up from anesthesia   Depression    DJD (degenerative joint disease)    GERD (gastroesophageal reflux disease)    resolved after gastric surgery   H/O gastric bypass 2016   History of iron deficiency anemia    HSV infection    Migraines    Obese    Pneumonia    Childhood history   PONV (postoperative nausea and vomiting)    Status post dilation of esophageal narrowing    Varicose vein of leg    Past Surgical History:  Procedure Laterality Date   ANKLE FRACTURE SURGERY Left 03/08/2009   BREAST SURGERY Bilateral    Cyst Removal   GASTRIC BYPASS  06/18/2015   LAPAROSCOPIC VAGINAL HYSTERECTOMY WITH SALPINGECTOMY Bilateral 05/15/2020   Procedure: LAPAROSCOPIC ASSISTED VAGINAL HYSTERECTOMY WITH SALPINGECTOMY;  Surgeon: Louretta Shorten, MD;  Location: [facility];  Service: Gynecology;  Laterality: Bilateral;  need bed   LASER ABLATION     Varicose veins   TONSILLECTOMY AND ADENOIDECTOMY  2001   TUBAL LIGATION  12/19/1999   UPPER GASTROINTESTINAL ENDOSCOPY     UPPER GI ENDOSCOPY     Uterine ablation  12/2018   Patient Active Problem List   Diagnosis Date Noted   LUQ pain 03/01/2022   Change in bowel habits 03/01/2022    Loss of weight 03/01/2022   Chronic migraine without aura without status migrainosus, not intractable 06/14/2021   S/P laparoscopic assisted vaginal hysterectomy (LAVH) 05/15/2020   Migraines    GERD (gastroesophageal reflux disease)    Asthma    Seasonal and perennial allergic rhinitis 02/10/2019   Moderate persistent asthma, uncomplicated 60/09/9322   Anaphylactic shock due to adverse food reaction 02/10/2019   Atopic dermatitis 02/10/2019   History of Roux-en-Y gastric bypass 2016    THERAPY DIAG:  Cervicalgia  Abnormal posture  REFERRING DIAG:  M54.2 (ICD-10-CM) - Neck pain  M99.01 (ICD-10-CM) - Somatic dysfunction of cervical region  M99.02 (ICD-10-CM) - Somatic dysfunction of thoracic region  M99.08 (ICD-10-CM) - Somatic dysfunction of rib region  M25.512 (ICD-10-CM) - Acute pain of left shoulder    PERTINENT HISTORY: repeat trigger point injections and has received OMT treatments with last being 05/23/22 by Glennon Mac, DO.  PRECAUTIONS/RESTRICTIONS:   No  SUBJECTIVE: Patient reports her shoulder hurt, they are always hurting. States she types a lot and this aggravates her pain.   Pain: 8/10 L > R shoulder, parascapular, neck Aggs: shoulder abd/ext combo Eases: rest   OBJECTIVE: PATIENT SURVEYS:  FOTO  40% function 07/03/2022: 32% function     COGNITION: Overall cognitive status: Within functional limits for tasks assessed     SENSATION: WFL   POSTURE:  sits with left lateral trunk lean, R shoulder heigher, R scapula lower, rounded shoulders bil, slight forward head, increased lumbar lordosis, L clavicle higher, L clavicular notch heigher, audible pop under L acromion    PALPATION: T2, T4-5, T7-8 hypomobile, pt with TTP along L Upper Trap,mid Trap, Teres; see posture section, coracoid process/acromion heigher left   CERVICAL ROM:    Active ROM A/PROM (deg) eval  Flexion WFL  Extension WFL  Right lateral flexion WFL  Left lateral flexion WFL   Right rotation WFL  Left rotation WFL   (Blank rows = not tested)   UPPER EXTREMITY MMT:   MMT Right eval Left eval Left 07/03/2022 07/26/22  Shoulder flexion 4+/5 3+/5 with pain  4/5, p!  Shoulder extension       Shoulder abduction 4+/5 3+/5 4+/5  Shoulder adduction 4+/5 3+/5 4+/5, p!  Shoulder extension       Shoulder internal rotation 4+/5 3+/5 5/5  Shoulder external rotation 4+/5 3+/5 4+/5  Elbow flexion       Elbow extension       Wrist flexion       Wrist extension       Wrist ulnar deviation       Wrist radial deviation       Wrist pronation       Wrist supination        (Blank rows = not tested) mid-Trap,Teres 2-/5   UPPER EXTREMITY AROM:   MMT Right eval Left eval  Shoulder flexion [facility] with pain  Shoulder extension      Shoulder abduction [facility] WFL  Shoulder adduction [facility]  Shoulder extension      Shoulder internal rotation [facility]  Shoulder external rotation WFL WFL                                                                                (Blank rows = not tested)   CERVICAL SPECIAL TESTS:  NT    TODAY'S TREATMENT: [facility] Adult PT Treatment:                                                DATE: 08/02/2022 Therapeutic Exercise: UBE L3 x 4 min (2 fwd/bwd) while taking subjective Seated upper trap stretch 2 x 30 sec each Instruction and demonstration of Theracane for SMFR Seated shoulder blade squeezes 10 x 5 sec Review seated posture while typing Manual Therapy: Skilled palpation and monitoring of muscle tension while performin TPDN STM / TPR for bilateral upper traps and parascapular region Grade V manipulation prone PA thoracic spine Trigger Point Dry Needling Treatment: Pre-treatment instruction: Patient instructed on dry needling rationale, procedures, and possible side effects including pain during treatment (achy,cramping feeling), bruising, drop of blood, lightheadedness, nausea, sweating. Patient Consent Given:  Yes Education handout provided: Previously provided Muscles treated: Bilateral upper trap, left rhomboid  Needle size and number: .  30x70m x 6 Electrical stimulation performed: No Parameters: N/A Treatment response/outcome: Twitch response elicited and Palpable decrease in muscle tension Post-treatment instructions: Patient instructed to expect possible mild to moderate muscle soreness later today and/or tomorrow. Patient instructed in methods to reduce muscle soreness and to continue prescribed HEP. If patient was dry needled over the lung field, patient was instructed on signs and symptoms of pneumothorax and, however unlikely, to see immediate medical attention should they occur. Patient was also educated on signs and symptoms of infection and to seek medical attention should they occur. Patient verbalized understanding of these instructions and education.    PATIENT EDUCATION:  Education details: HEP, postural education, theracane Person educated: Patient Education method: Explanation, Demonstration, and Handouts Education comprehension: verbalized understanding and returned demonstration   HOME EXERCISE PROGRAM: Access Code: [email or web address] URL: [email or web address] Date: 06/07/2022 Prepared by: NAmerica Denali Sharma  Exercises - Standing Scapular Depression  - 2 x daily - 7 x weekly - 1 sets - 15 reps - Seated Scapular Retraction  - 2 x daily - 7 x weekly - 1 sets - 15 reps - Latissimus Dorsi Stretch at Wall  - 2 x daily - 7 x weekly - 1 sets - 2 reps - 30 sec hold   [facility] Adult PT Treatment:                                                DATE: 07/26/22 Therapeutic Exercise: NuStep level 4 x 5 min for warm up while taking subjective and monitoring for muscle substitution Seated low rows with 35# x 20 Seated high rows with 35# x 20 Seated lat pull-downs with 40# cable 2x10 Standing BIL shoulder scaption with 3# dumbbells 2x10 Freemotion L ER 3 lbs x 20 Freemotion row 20 lbs x  10, 17 lbs x 10 L  Freemotion L shoulder ext 3 lbs x 20 Seated sidebend stretch 2x30 sec each side L reverse fly 5 lbs x 10 with PT max verbal cues for technique GTB bil reverse fly/ext combo x 20 near hips L scapular protraction stretch 2x30 sec    [facility] Adult PT Treatment:                                                DATE: 07/25/2022 Therapeutic Exercise: UBE 2.5'/2.5' fwd and backward for warm up while taking subjective Seated low rows with 40# cable 2x10 Seated high rows with 40# cable 2x10 Seated lat pull-downs with 40# cable 2x10 Standing BIL shoulder scaption with 3# dumbbells 2x10  Manual Therapy: Skilled palpation to identify trigger points prior to TPDN Effleurage/ STM to all musculature following TPDN   Trigger Point Dry-Needling  Treatment instructions: Expect mild to moderate muscle soreness. S/S of pneumothorax if dry needled over a lung field, and to seek immediate medical attention should they occur. Patient verbalized understanding of these instructions and education.  Patient Consent Given: Yes Education handout provided: No Muscles treated: BIL UT, BIL subscapularis, BIL C4-C5 cervical multifidi, BIL suboccipitals, BIL infraspinatus  Electrical stimulation performed: No Parameters: N/A Treatment response/outcome: Improved muscle extensibility  [facility] Adult PT Treatment:  DATE: 07/03/2022 Therapeutic Exercise: Seated low rows with 35# cable 2x10 Seated high rows with 35# cable 2x10 Seated lat pull-downs with 35# cable 2x10 Seated shoulder rolls 2x10 forward and backward Standing BIL shoulder scaption with 3# dumbbells 2x10 Manual Therapy: Skilled palpation to identify trigger points prior to TPDN Effleurage/ STM to all musculature following TPDN x10 minutes Trigger Point Dry-Needling  Treatment instructions: Expect mild to moderate muscle soreness. S/S of pneumothorax if dry needled over a lung field, and to seek  immediate medical attention should they occur. Patient verbalized understanding of these instructions and education.  Patient Consent Given: Yes Education handout provided: No Muscles treated: BIL UT, BIL subscapularis, BIL C4-C5 cervical multifidi, BIL suboccipitals, BIL infraspinatus  Electrical stimulation performed: No Parameters: N/A Treatment response/outcome: Improved muscle extensibility, reported decrease in pain from 6/10 to 4/10  Neuromuscular re-ed: N/A Therapeutic Activity: N/A Modalities: N/A Self Care: N/A  TREATMENT 07/01/2022:  Therapeutic Exercise: - UBE 2.5'/2.5' fwd and backward for warm up while taking subjective - low row - 35# - 2x6 - high row - 35# - 2x6 - lat pull down - 35# - 2x8 - 5# OH press - 2x10 ea - S/L ER - 3x10 - L - 2#  Manual therapy: Skilled palpation to identify trigger points for TDN (needle insertion not charged) STM of bil sub occipitals, Bil cervical paraspinals, Bil infraspinatus, Bil supraspinatus, Bil UT, Bil LS  Trigger Point Dry-Needling  Treatment instructions: Expect mild to moderate muscle soreness. S/S of pneumothorax if dry needled over a lung field, and to seek immediate medical attention should they occur. Patient verbalized understanding of these instructions and education.  Patient Consent Given: Yes Education handout provided: No Muscles treated: bil sub occipitals, bil cervical paraspinals, bil infraspinatus, bil supraspinatus, bil UT, bil subscap, bil LS Electrical stimulation performed: No Parameters: N/A Treatment response/outcome: twitch and pain relief  TREATMENT 06/24/2022:  Therapeutic Exercise: - UBE 2.5'/2.5' fwd and backward for warm up while taking subjective - low row - 30# - 2x6 - high row - 30# - 2x6 - lat pull down - 30# - 2x8 - 5# OH press - 2x10 ea - S/L ER - 3x10 - L    Manual therapy: Skilled palpation to identify trigger points for TDN (needle insertion not charged) STM of bil sub  occipitals, L cervical paraspinals, L infraspinatus, L supraspinatus, L UT  Trigger Point Dry-Needling  Treatment instructions: Expect mild to moderate muscle soreness. S/S of pneumothorax if dry needled over a lung field, and to seek immediate medical attention should they occur. Patient verbalized understanding of these instructions and education.  Patient Consent Given: Yes Education handout provided: No Muscles treated: bil sub occipitals, L cervical paraspinals, L infraspinatus, L supraspinatus, L UT, L subscap Electrical stimulation performed: No Parameters: N/A Treatment response/outcome: twitch and pain relief   ASSESSMENT: CLINICAL IMPRESSION: Patient tolerated therapy well with no adverse effects. Therapy focused primarily on TPDN and manual for upper traps and parascapular region as well as thoracic manipulation. Patient tolerated this well and reported improvement in symptoms following therapy. Instructed patient on theracane or other options for SMFR at home to reduce muscle tension, and postural control. No changes made to HEP. Patient would benefit from continued skilled PT to progress her mobility and strength in order to reduce pain and maximize functional ability.    OBJECTIVE IMPAIRMENTS decreased activity tolerance, decreased coordination, decreased mobility, decreased ROM, decreased strength, hypomobility, increased muscle spasms, impaired flexibility, impaired UE functional use, postural dysfunction, and  pain.    ACTIVITY LIMITATIONS carrying, reach over head, and locomotion level   PARTICIPATION LIMITATIONS: meal prep, community activity, and occupation   [place] are also affecting patient's functional outcome.      GOALS: Goals reviewed with patient? Yes   SHORT TERM GOALS: Target date: 07/05/2022    Pt will be indep with initial HEP to assist with pain management Baseline: initiated at eval Goal status: MET   2.  Pt will report 25%  improvement in L shoulder symptoms with functional activities at work Baseline: 100% present 7/26: 25% improvement Goal status: MET     LONG TERM GOALS: Target date:  09/06/22   Pt will be able to wipe down the scales with a circular motion at work without pain Baseline: 8/10; 07/26/22: 6/10 Goal status: ONGOING   2.  Pt will be able to perform left lateral cervical sidebend at work without pain Baseline: 10/10; 07/26/22: 5/10 Goal status: ONGOING   3.  Pt will be able to mop/sweep without pain Baseline: 6/10; 07/26/22: 7-8/10 Goal status: ONGOING   4.  Pt will have improved FOTO score to >/=57% function Baseline: 40% function 07/03/2022: 32% Goal status: ONGOING   5.  Pt will demo improvement in L shoulder strength to >/= 4+/5 to assist with work duties Baseline: 3+/5 throughout except mid trap/teres 2-/5 07/03/2022: 4+/5 Goal status: ACHIEVED     PLAN: PT FREQUENCY: 2x/week   PT DURATION: other: 6 weeks   PLANNED INTERVENTIONS: Therapeutic exercises, Therapeutic activity, Neuromuscular re-education, Patient/Family education, Joint mobilization, Dry Needling, Moist heat, Taping, Ultrasound, Manual therapy, and Re-evaluation   PLAN FOR NEXT SESSION:  L clavicular/scapular mobs, manual therapy for L Upper Trap/Teres/Mid trap tightness; strengthening L shoulder, dry needling   Hilda Blades, PT, DPT, LAT, ATC 08/02/22  12:36 PM Phone: [phone] Fax: [phone]

## 2022-08-04 ENCOUNTER — Ambulatory Visit (INDEPENDENT_AMBULATORY_CARE_PROVIDER_SITE_OTHER): Payer: 59 | Admitting: Neurology

## 2022-08-04 ENCOUNTER — Other Ambulatory Visit (HOSPITAL_COMMUNITY): Payer: Self-pay

## 2022-08-04 DIAGNOSIS — G43709 Chronic migraine without aura, not intractable, without status migrainosus: Secondary | ICD-10-CM

## 2022-08-04 MED ORDER — ONABOTULINUMTOXINA 200 UNITS IJ SOLR
155.0000 [IU] | Freq: Once | INTRAMUSCULAR | Status: AC
  Administered 2022-08-04: 155 [IU] via INTRAMUSCULAR

## 2022-08-04 NOTE — Progress Notes (Unsigned)
Botox consent signed  Botox- 200 units x 1 vial Lot: [id number] Expiration: 01/2025 NDC: 4446-1901-22  Bacteriostatic 0.9% Sodium Chloride- 29m total Lot: [id number]: 07/09/2023 NDC: [address]-01 Dx: GT00.349B/B

## 2022-08-04 NOTE — Progress Notes (Unsigned)
Consent Form Botulism Toxin Injection For Chronic Migraine 08/04/2022: stable  04/18/2022; last seen by Dr Jaynee Eagles in 10/2021 and started on Botox therapy. She had second procedure 01/14/2022. She reports significant improvement in migraines. She feels migraines are 85-90% improved. Baseline: more than 25 headache days a month with greater than 10 being moderate to severe migraines. SHE LOST 170 POUNDS AFTER GASTRIC BYPASS 7 YEARS AGO. She works at [employer] with Dr. Leafy Ro.   She now only has 4 migraine days a month and <10 total headache days a month. Failed sumatriptan, rizatriptan. Prescrive nurtec.  Meds ordered this encounter  Medications   botulinum toxin Type A (BOTOX) injection 155 Units    Botox- 200 units x 1 vial Lot: [id number] Expiration: 01/2025 NDC: 0272-5366-44  B/B    Reviewed orally with patient, additionally signature is on file:  Botulism toxin has been approved by the Federal drug administration for treatment of chronic migraine. Botulism toxin does not cure chronic migraine and it may not be effective in some patients.  The administration of botulism toxin is accomplished by injecting a small amount of toxin into the muscles of the neck and head. Dosage must be titrated for each individual. Any benefits resulting from botulism toxin tend to wear off after 3 months with a repeat injection required if benefit is to be maintained. Injections are usually done every 3-4 months with maximum effect peak achieved by about 2 or 3 weeks. Botulism toxin is expensive and you should be sure of what costs you will incur resulting from the injection.  The side effects of botulism toxin use for chronic migraine may include:   -Transient, and usually mild, facial weakness with facial injections  -Transient, and usually mild, head or neck weakness with head/neck injections  -Reduction or loss of forehead facial animation due to forehead muscle weakness  -Eyelid drooping  -Dry eye  -Pain at the  site of injection or bruising at the site of injection  -Double vision  -Potential unknown long term risks  Contraindications: You should not have Botox if you are pregnant, nursing, allergic to albumin, have an infection, skin condition, or muscle weakness at the site of the injection, or have myasthenia gravis, Lambert-Eaton syndrome, or ALS.  It is also possible that as with any injection, there may be an allergic reaction or no effect from the medication. Reduced effectiveness after repeated injections is sometimes seen and rarely infection at the injection site may occur. All care will be taken to prevent these side effects. If therapy is given over a long time, atrophy and wasting in the muscle injected may occur. Occasionally the patient's become refractory to treatment because they develop antibodies to the toxin. In this event, therapy needs to be modified.  I have read the above information and consent to the administration of botulism toxin.    BOTOX PROCEDURE NOTE FOR MIGRAINE HEADACHE    Contraindications and precautions discussed with patient(above). Aseptic procedure was observed and patient tolerated procedure. Procedure performed by Dr. Georgia Dom  The condition has existed for more than 6 months, and pt does not have a diagnosis of ALS, Myasthenia Gravis or Lambert-Eaton Syndrome.  Risks and benefits of injections discussed and pt agrees to proceed with the procedure.  Written consent obtained  These injections are medically necessary. Pt  receives good benefits from these injections. These injections do not cause sedations or hallucinations which the oral therapies may cause.  Description of procedure:  The patient was placed in a  sitting position. The standard protocol was used for Botox as follows, with 5 units of Botox injected at each site:   -Procerus muscle, midline injection  -Corrugator muscle, bilateral injection  -Frontalis muscle, bilateral injection, with  2 sites each side, medial injection was performed in the upper one third of the frontalis muscle, in the region vertical from the medial inferior edge of the superior orbital rim. The lateral injection was again in the upper one third of the forehead vertically above the lateral limbus of the cornea, 1.5 cm lateral to the medial injection site.  -Temporalis muscle injection, 4 sites, bilaterally. The first injection was 3 cm above the tragus of the ear, second injection site was 1.5 cm to 3 cm up from the first injection site in line with the tragus of the ear. The third injection site was 1.5-3 cm forward between the first 2 injection sites. The fourth injection site was 1.5 cm posterior to the second injection site.   -Occipitalis muscle injection, 3 sites, bilaterally. The first injection was done one half way between the occipital protuberance and the tip of the mastoid process behind the ear. The second injection site was done lateral and superior to the first, 1 fingerbreadth from the first injection. The third injection site was 1 fingerbreadth superiorly and medially from the first injection site.  -Cervical paraspinal muscle injection, 2 sites, bilateral knee first injection site was 1 cm from the midline of the cervical spine, 3 cm inferior to the lower border of the occipital protuberance. The second injection site was 1.5 cm superiorly and laterally to the first injection site.  -Trapezius muscle injection was performed at 3 sites, bilaterally. The first injection site was in the upper trapezius muscle halfway between the inflection point of the neck, and the acromion. The second injection site was one half way between the acromion and the first injection site. The third injection was done between the first injection site and the inflection point of the neck.   Will return for repeat injection in 3 months.   200 units of Botox was used, 45 U Botox not injected was wasted. The patient  tolerated the procedure well, there were no complications of the above procedure.

## 2022-08-05 ENCOUNTER — Ambulatory Visit: Payer: 59 | Admitting: Physical Therapy

## 2022-08-06 ENCOUNTER — Encounter: Payer: Self-pay | Admitting: Physical Therapy

## 2022-08-06 ENCOUNTER — Ambulatory Visit: Payer: 59 | Admitting: Physical Therapy

## 2022-08-06 ENCOUNTER — Encounter: Payer: Self-pay | Admitting: Gastroenterology

## 2022-08-06 DIAGNOSIS — R293 Abnormal posture: Secondary | ICD-10-CM

## 2022-08-06 DIAGNOSIS — M542 Cervicalgia: Secondary | ICD-10-CM | POA: Diagnosis not present

## 2022-08-06 NOTE — Therapy (Signed)
OUTPATIENT PHYSICAL THERAPY TREATMENT NOTE   Patient Name: Donna Park MRN: [id number] DOB:[birth year], 51 y.o., female Today's Date: 08/06/2022  PCP: Dorothyann Peng REFERRING PROVIDER: [name]   PT End of Session - 08/06/22 1811     Visit Number 10    Number of Visits 10    Date for PT Re-Evaluation 09/06/22    Authorization Type UMR    Progress Note Due on Visit 10    PT Start Time 1812    PT Stop Time 1856    PT Time Calculation (min) 44 min    Activity Tolerance Patient tolerated treatment well    Behavior During Therapy WFL for tasks assessed/performed                 Past Medical History:  Diagnosis Date   Allergy    Anxiety    Arthritis    Asthma    Chicken pox    Complication of anesthesia    slow to wake up from anesthesia   Depression    DJD (degenerative joint disease)    GERD (gastroesophageal reflux disease)    resolved after gastric surgery   H/O gastric bypass 2016   History of iron deficiency anemia    HSV infection    Migraines    Obese    Pneumonia    Childhood history   PONV (postoperative nausea and vomiting)    Status post dilation of esophageal narrowing    Varicose vein of leg    Past Surgical History:  Procedure Laterality Date   ANKLE FRACTURE SURGERY Left 03/08/2009   BREAST SURGERY Bilateral    Cyst Removal   GASTRIC BYPASS  06/18/2015   LAPAROSCOPIC VAGINAL HYSTERECTOMY WITH SALPINGECTOMY Bilateral 05/15/2020   Procedure: LAPAROSCOPIC ASSISTED VAGINAL HYSTERECTOMY WITH SALPINGECTOMY;  Surgeon: Louretta Shorten, MD;  Location: [facility];  Service: Gynecology;  Laterality: Bilateral;  need bed   LASER ABLATION     Varicose veins   TONSILLECTOMY AND ADENOIDECTOMY  2001   TUBAL LIGATION  12/19/1999   UPPER GASTROINTESTINAL ENDOSCOPY     UPPER GI ENDOSCOPY     Uterine ablation  12/2018   Patient Active Problem List   Diagnosis Date Noted   LUQ pain 03/01/2022   Change in bowel habits 03/01/2022    Loss of weight 03/01/2022   Chronic migraine without aura without status migrainosus, not intractable 06/14/2021   S/P laparoscopic assisted vaginal hysterectomy (LAVH) 05/15/2020   Migraines    GERD (gastroesophageal reflux disease)    Asthma    Seasonal and perennial allergic rhinitis 02/10/2019   Moderate persistent asthma, uncomplicated 19/37/9024   Anaphylactic shock due to adverse food reaction 02/10/2019   Atopic dermatitis 02/10/2019   History of Roux-en-Y gastric bypass 2016    THERAPY DIAG:  Cervicalgia  Abnormal posture  REFERRING DIAG:  M54.2 (ICD-10-CM) - Neck pain  M99.01 (ICD-10-CM) - Somatic dysfunction of cervical region  M99.02 (ICD-10-CM) - Somatic dysfunction of thoracic region  M99.08 (ICD-10-CM) - Somatic dysfunction of rib region  M25.512 (ICD-10-CM) - Acute pain of left shoulder    PERTINENT HISTORY: repeat trigger point injections and has received OMT treatments with last being 05/23/22 by Glennon Mac, DO.  PRECAUTIONS/RESTRICTIONS:   No  SUBJECTIVE: Patient reports her shoulder hurt, they are always hurting. States she types a lot and this aggravates her pain.   Pain: 6/10 L > R shoulder, parascapular, neck Aggs: shoulder abd/ext combo Eases: rest   OBJECTIVE: PATIENT SURVEYS:  FOTO  40% function 07/03/2022: 32% function     COGNITION: Overall cognitive status: Within functional limits for tasks assessed     SENSATION: WFL   POSTURE:  sits with left lateral trunk lean, R shoulder heigher, R scapula lower, rounded shoulders bil, slight forward head, increased lumbar lordosis, L clavicle higher, L clavicular notch heigher, audible pop under L acromion    PALPATION: T2, T4-5, T7-8 hypomobile, pt with TTP along L Upper Trap,mid Trap, Teres; see posture section, coracoid process/acromion heigher left   CERVICAL ROM:    Active ROM A/PROM (deg) eval  Flexion WFL  Extension WFL  Right lateral flexion WFL  Left lateral flexion WFL   Right rotation WFL  Left rotation WFL   (Blank rows = not tested)   UPPER EXTREMITY MMT:   MMT Right eval Left eval Left 07/03/2022 07/26/22  Shoulder flexion 4+/5 3+/5 with pain  4/5, p!  Shoulder extension       Shoulder abduction 4+/5 3+/5 4+/5  Shoulder adduction 4+/5 3+/5 4+/5, p!  Shoulder extension       Shoulder internal rotation 4+/5 3+/5 5/5  Shoulder external rotation 4+/5 3+/5 4+/5  Elbow flexion       Elbow extension       Wrist flexion       Wrist extension       Wrist ulnar deviation       Wrist radial deviation       Wrist pronation       Wrist supination        (Blank rows = not tested) mid-Trap,Teres 2-/5   UPPER EXTREMITY AROM:   MMT Right eval Left eval  Shoulder flexion [facility] with pain  Shoulder extension      Shoulder abduction [facility] WFL  Shoulder adduction [facility]  Shoulder extension      Shoulder internal rotation [facility]  Shoulder external rotation WFL WFL                                                                                (Blank rows = not tested)   CERVICAL SPECIAL TESTS:  NT    TODAY'S TREATMENT:     [facility] Adult PT Treatment:                                                DATE: 08/06/2022 Therapeutic Exercise: UBE 2.5'/2.5' fwd and backward for warm up while taking subjective Seated low rows with 40# cable 2x10 Seated high rows with 40# cable 2x10 Seated lat pull-downs with 40# cable 2x10 Standing BIL shoulder scaption with 3# dumbbells 2x10 Prone TYWI 2x10 ea  Manual Therapy: Skilled palpation to identify trigger points prior to TPDN Effleurage/ STM to all musculature following TPDN   Trigger Point Dry-Needling  Treatment instructions: Expect mild to moderate muscle soreness. S/S of pneumothorax if dry needled over a lung field, and to seek immediate medical attention should they occur. Patient verbalized understanding of these instructions and education.  Patient Consent Given: Yes Education handout  provided: No Muscles treated: BIL  UT, BIL subscapularis, BIL C4-C5 cervical multifidi, BIL suboccipitals, BIL infraspinatus  Electrical stimulation performed: No Parameters: N/A Treatment response/outcome: Improved muscle extensibility  [facility] Adult PT Treatment:                                                DATE: 07/26/22 Therapeutic Exercise: NuStep level 4 x 5 min for warm up while taking subjective and monitoring for muscle substitution Seated low rows with 35# x 20 Seated high rows with 35# x 20 Seated lat pull-downs with 40# cable 2x10 Standing BIL shoulder scaption with 3# dumbbells 2x10 Freemotion L ER 3 lbs x 20 Freemotion row 20 lbs x 10, 17 lbs x 10 L  Freemotion L shoulder ext 3 lbs x 20 Seated sidebend stretch 2x30 sec each side L reverse fly 5 lbs x 10 with PT max verbal cues for technique GTB bil reverse fly/ext combo x 20 near hips L scapular protraction stretch 2x30 sec    [facility] Adult PT Treatment:                                                DATE: 07/25/2022 Therapeutic Exercise: UBE 2.5'/2.5' fwd and backward for warm up while taking subjective Seated low rows with 40# cable 2x10 Seated high rows with 40# cable 2x10 Seated lat pull-downs with 40# cable 2x10 Standing BIL shoulder scaption with 3# dumbbells 2x10  Manual Therapy: Skilled palpation to identify trigger points prior to TPDN Effleurage/ STM to all musculature following TPDN   Trigger Point Dry-Needling  Treatment instructions: Expect mild to moderate muscle soreness. S/S of pneumothorax if dry needled over a lung field, and to seek immediate medical attention should they occur. Patient verbalized understanding of these instructions and education.  Patient Consent Given: Yes Education handout provided: No Muscles treated: BIL UT, BIL subscapularis, BIL C4-C5 cervical multifidi, BIL suboccipitals, BIL infraspinatus  Electrical stimulation performed: No Parameters: N/A Treatment response/outcome:  Improved muscle extensibility  [facility] Adult PT Treatment:                                                DATE: 07/03/2022 Therapeutic Exercise: Seated low rows with 35# cable 2x10 Seated high rows with 35# cable 2x10 Seated lat pull-downs with 35# cable 2x10 Seated shoulder rolls 2x10 forward and backward Standing BIL shoulder scaption with 3# dumbbells 2x10 Manual Therapy: Skilled palpation to identify trigger points prior to TPDN Effleurage/ STM to all musculature following TPDN x10 minutes Trigger Point Dry-Needling  Treatment instructions: Expect mild to moderate muscle soreness. S/S of pneumothorax if dry needled over a lung field, and to seek immediate medical attention should they occur. Patient verbalized understanding of these instructions and education.  Patient Consent Given: Yes Education handout provided: No Muscles treated: BIL UT, BIL subscapularis, BIL C4-C5 cervical multifidi, BIL suboccipitals, BIL infraspinatus  Electrical stimulation performed: No Parameters: N/A Treatment response/outcome: Improved muscle extensibility, reported decrease in pain from 6/10 to 4/10  Neuromuscular re-ed: N/A Therapeutic Activity: N/A Modalities: N/A Self Care: N/A  TREATMENT 07/01/2022:  Therapeutic Exercise: - UBE 2.5'/2.5' fwd and backward  for warm up while taking subjective - low row - 35# - 2x6 - high row - 35# - 2x6 - lat pull down - 35# - 2x8 - 5# OH press - 2x10 ea - S/L ER - 3x10 - L - 2#  Manual therapy: Skilled palpation to identify trigger points for TDN (needle insertion not charged) STM of bil sub occipitals, Bil cervical paraspinals, Bil infraspinatus, Bil supraspinatus, Bil UT, Bil LS  Trigger Point Dry-Needling  Treatment instructions: Expect mild to moderate muscle soreness. S/S of pneumothorax if dry needled over a lung field, and to seek immediate medical attention should they occur. Patient verbalized understanding of these instructions and  education.  Patient Consent Given: Yes Education handout provided: No Muscles treated: bil sub occipitals, bil cervical paraspinals, bil infraspinatus, bil supraspinatus, bil UT, bil subscap, bil LS Electrical stimulation performed: No Parameters: N/A Treatment response/outcome: twitch and pain relief  TREATMENT 06/24/2022:  Therapeutic Exercise: - UBE 2.5'/2.5' fwd and backward for warm up while taking subjective - low row - 30# - 2x6 - high row - 30# - 2x6 - lat pull down - 30# - 2x8 - 5# OH press - 2x10 ea - S/L ER - 3x10 - L    Manual therapy: Skilled palpation to identify trigger points for TDN (needle insertion not charged) STM of bil sub occipitals, L cervical paraspinals, L infraspinatus, L supraspinatus, L UT  Trigger Point Dry-Needling  Treatment instructions: Expect mild to moderate muscle soreness. S/S of pneumothorax if dry needled over a lung field, and to seek immediate medical attention should they occur. Patient verbalized understanding of these instructions and education.  Patient Consent Given: Yes Education handout provided: No Muscles treated: bil sub occipitals, L cervical paraspinals, L infraspinatus, L supraspinatus, L UT, L subscap Electrical stimulation performed: No Parameters: N/A Treatment response/outcome: twitch and pain relief   ASSESSMENT: CLINICAL IMPRESSION: Patient tolerated therapy well with no adverse reaction.  She continues to report significant pain relief following MT and TDN.  We were able to add in prone periscapular strengthening today.  She has significant fatigue with ITWY but is able to complete with good form.    OBJECTIVE IMPAIRMENTS decreased activity tolerance, decreased coordination, decreased mobility, decreased ROM, decreased strength, hypomobility, increased muscle spasms, impaired flexibility, impaired UE functional use, postural dysfunction, and pain.    ACTIVITY LIMITATIONS carrying, reach over head, and locomotion  level   PARTICIPATION LIMITATIONS: meal prep, community activity, and occupation   [place] are also affecting patient's functional outcome.      GOALS: Goals reviewed with patient? Yes   SHORT TERM GOALS: Target date: 07/05/2022    Pt will be indep with initial HEP to assist with pain management Baseline: initiated at eval Goal status: MET   2.  Pt will report 25% improvement in L shoulder symptoms with functional activities at work Baseline: 100% present 7/26: 25% improvement Goal status: MET     LONG TERM GOALS: Target date:  09/06/22   Pt will be able to wipe down the scales with a circular motion at work without pain Baseline: 8/10; 07/26/22: 6/10 Goal status: ONGOING   2.  Pt will be able to perform left lateral cervical sidebend at work without pain Baseline: 10/10; 07/26/22: 5/10 Goal status: ONGOING   3.  Pt will be able to mop/sweep without pain Baseline: 6/10; 07/26/22: 7-8/10 Goal status: ONGOING   4.  Pt will have improved FOTO score to >/=57% function Baseline: 40% function 07/03/2022: 32% Goal  status: ONGOING   5.  Pt will demo improvement in L shoulder strength to >/= 4+/5 to assist with work duties Baseline: 3+/5 throughout except mid trap/teres 2-/5 07/03/2022: 4+/5 Goal status: ACHIEVED     PLAN: PT FREQUENCY: 2x/week   PT DURATION: other: 6 weeks   PLANNED INTERVENTIONS: Therapeutic exercises, Therapeutic activity, Neuromuscular re-education, Patient/Family education, Joint mobilization, Dry Needling, Moist heat, Taping, Ultrasound, Manual therapy, and Re-evaluation   PLAN FOR NEXT SESSION:  L clavicular/scapular mobs, manual therapy for L Upper Trap/Teres/Mid trap tightness; strengthening L shoulder, dry needling   Mathis Dad PT 08/06/22  6:56 PM Phone: [phone] Fax: [phone]

## 2022-08-07 ENCOUNTER — Other Ambulatory Visit: Payer: Self-pay

## 2022-08-07 ENCOUNTER — Other Ambulatory Visit: Payer: Self-pay | Admitting: Adult Health

## 2022-08-07 ENCOUNTER — Other Ambulatory Visit (HOSPITAL_COMMUNITY): Payer: Self-pay

## 2022-08-07 MED ORDER — LINACLOTIDE 290 MCG PO CAPS
290.0000 ug | ORAL_CAPSULE | Freq: Every day | ORAL | 1 refills | Status: DC
  Filled 2022-08-07: qty 90, 90d supply, fill #0
  Filled 2022-10-20: qty 90, 90d supply, fill #1

## 2022-08-07 MED ORDER — VITAMIN D (ERGOCALCIFEROL) 1.25 MG (50000 UNIT) PO CAPS
50000.0000 [IU] | ORAL_CAPSULE | ORAL | 3 refills | Status: AC
Start: 2022-08-07 — End: 2022-11-13
  Filled 2022-08-07: qty 12, 84d supply, fill #0

## 2022-08-07 NOTE — Telephone Encounter (Signed)
Last time I saw her in the office the constipation had improved with Linzess 145 mcg QOD. Is she still taking QOD, or daily now? If 145 mcg daily is not efficacious, can send in Rx for 290 mcg daily and monitor for clinical improvement. Please ensure drinking plenty of water daily (goal 64+ oz/day). Thanks.

## 2022-08-07 NOTE — Progress Notes (Signed)
[name] D.[place] Phone: [phone]   Assessment and Plan:     1. Chronic bilateral low back pain with right-sided sciatica 2. Neck pain 3. Chronic left shoulder pain 4. Somatic dysfunction of cervical region 5. Somatic dysfunction of thoracic region 6. Somatic dysfunction of lumbar region 7. Somatic dysfunction of pelvic region 8. Somatic dysfunction of rib region  -Chronic with exacerbation, subsequent sports medicine visit - Multiple musculoskeletal complaints with flares of neck pain, left trapezius pain, left shoulder pain, low back pain - Patient is overall felt some improvement with physical therapy for neck and bilateral trapezius.  Continue physical therapy and add an additional low back physical therapy - Patient has received moderate improvement with dry needling with physical therapy as well as with trigger point injections with me in the past.  Patient electing for repeat trigger point injections.  Tolerated well per note below.  Trigger Point Injection: After informed consent was obtained, skin cleaned with alcohol  prep.  A total of 4 trigger points identified along left trapezius, left rhomboid, right rhomboid.  Injections given over area of pain for total injection of 3 ml lidocaine 1% w/o epi and 1 mL Kenalog 40 mg/ML.  Patient had relief after the injection without side effects.  Pt given signs of infection to watch for.  - Patient has received significant relief with OMT in the past.  Elects for repeat OMT today.  Tolerated well per note below. - Decision today to treat with OMT was based on Physical Exam  After verbal consent patient was treated with HVLA (high velocity low amplitude), ME (muscle energy), FPR (flex positional release), ST (soft tissue), PC/PD (Pelvic Compression/ Pelvic Decompression) techniques in cervical, rib, thoracic, lumbar, and pelvic areas. Patient tolerated  the procedure well with improvement in symptoms.  Patient educated on potential side effects of soreness and recommended to rest, hydrate, and use Tylenol as needed for pain control.   Pertinent previous records reviewed include PT note 08/06/2022, PT note 08/02/2022, PT note 07/26/2022   Follow Up: 3 weeks for reevaluation.  Could consider x-ray and CSI of left shoulder if no improvement or worsening of symptoms.  Could repeat OMT if patient found treatment beneficial   Subjective:   I, [name], am serving as a Education administrator for Doctor Glennon Mac  Chief Complaint: left shoulder pain and tightness  HPI:  01/31/2022 Patient is a 51 year old female complaining of neck and shoulder pain. Patient states been going on for about 2 weeks has knots in her shoulders both neck and shoulders have been stiff , interested in OMT , knots in her upper trap , low back and her sciatic nerve on her right side flares up when she's having intercourse goes down her leg and upper her back    02/21/2022 Patient states that her neck and her shoulders are still bothering her after her adjustment she was very tight is still tight in the upper back , still wants the adjustment will get a massage later tonight as well  , hip still hurts , her tailbone she woke up one morning with pain     03/06/2022 Patient states still has upper trap tightness, finder her self leaning on that left side    03/28/2022 Patient states that she is still tight time for an adjustment would like to discuss a lower spot for CSI    04/25/2022 Patient states that her neck  is heavy, would like a referral for MRI, still neck tightness and pain,   05/23/2022 Patient states she is ready for the OMT but the left shoulder clavicle have been sore, neck and shoulder still hurts. Patient has had the MRI of the ankle this morning. Patient wanting to know if she can get another shoulder injection today    06/20/2022 Patient states that she is a little  better decreased ROM but is going to PT , ankle feels better bought different shoes    08/08/2022  Patient states shoulder still hurts has been dry needling and want to talk talk about lidocaine again and then low back pain want referral for PT and sciatic pain     Relevant Historical Information: History of migraines being treated with Botox injections   Relevant Historical Information: Migraines, history of Roux-en-Y gastric bypass  Additional pertinent review of systems negative.   Current Outpatient Medications:    acetaminophen (TYLENOL) 500 MG tablet, Take by mouth., Disp: , Rfl:    acyclovir ointment (ZOVIRAX) 5 %, Apply onto the affected area every 3 hours., Disp: 15 g, Rfl: 1   albuterol (VENTOLIN HFA) 108 (90 Base) MCG/ACT inhaler, Inhale 1 - 2 puffs into the lungs every 6 hours as needed for wheezing or shortness of breath., Disp: 20.1 g, Rfl: 0   amitriptyline (ELAVIL) 10 MG tablet, Take 1 tablet (10 mg total) by mouth at bedtime., Disp: 90 tablet, Rfl: 1   azelastine (ASTELIN) 0.1 % nasal spray, Place 2 sprays into both nostrils 2 (two) times daily., Disp: 30 mL, Rfl: 5   Azelastine-Fluticasone (DYMISTA) 137-50 MCG/ACT SUSP, Place 2 sprays into both nostrils 2 (two) times daily as needed., Disp: 23 g, Rfl: 5   CALCIUM PO, Take 1 tablet by mouth 4 (four) times a week., Disp: , Rfl:    cetirizine (ZYRTEC) 10 MG tablet, Take 2 tablets (20 mg total) by mouth 2 (two) times daily., Disp: 360 tablet, Rfl: 1   Cyanocobalamin (NASCOBAL NA), Place 1 spray into the nose every Sunday., Disp: , Rfl:    diazepam (VALIUM) 10 MG tablet, Take 1 tablet (10 mg total) by mouth at bedtime as needed for sleep., Disp: 30 tablet, Rfl: 2   EPINEPHrine 0.3 mg/0.3 mL IJ SOAJ injection, Inject 0.3 mg into the muscle as needed for anaphylaxis., Disp: 2 each, Rfl: 1   estradiol (ESTRACE) 2 MG tablet, Take 1 tablet (2 mg total) by mouth daily., Disp: 90 tablet, Rfl: 12   estradiol (ESTRACE) 2 MG tablet, Take  1 tablet (2 mg total) by mouth daily., Disp: 30 tablet, Rfl: 12   fluconazole (DIFLUCAN) 150 MG tablet, Take 1 tablet (150 mg total) by mouth daily., Disp: 14 tablet, Rfl: 2   fluticasone (FLONASE) 50 MCG/ACT nasal spray, Place 2 sprays into both nostrils daily., Disp: 16 g, Rfl: 5   fluticasone-salmeterol (ADVAIR HFA) 230-21 MCG/ACT inhaler, INHALE 2 PUFFS INTO THE LUNGS 2 (TWO) TIMES DAILY., Disp: 36 g, Rfl: 1   Fluticasone-Umeclidin-Vilant (TRELEGY ELLIPTA) 200-62.5-25 MCG/ACT AEPB, Inhale 1 puff into the lungs daily., Disp: 60 each, Rfl: 5   Galcanezumab-gnlm (EMGALITY) 120 MG/ML SOAJ, Inject 120 mg into the skin every 30 (thirty) days., Disp: 3 mL, Rfl: 0   Hypertonic Nasal Wash (SINUS RINSE REFILL) PACK, Use one packet dissolved in water as needed. (Patient taking differently: Place 1 each into the nose in the morning and at bedtime.), Disp: 100 each, Rfl: 5   ibuprofen (ADVIL) 800 MG tablet, Take 1  tablet (800 mg total) by mouth 3 (three) times daily as needed (pain.)., Disp: 270 tablet, Rfl: 0   ipratropium (ATROVENT) 0.06 % nasal spray, Place 2 sprays into both nostrils 4 (four) times daily., Disp: 45 mL, Rfl: 3   linaclotide (LINZESS) 145 MCG CAPS capsule, Take 1 capsule (145 mcg total) by mouth daily before breakfast., Disp: 60 capsule, Rfl: 3   linaclotide (LINZESS) 290 MCG CAPS capsule, Take 1 capsule (290 mcg total) by mouth daily before breakfast., Disp: 90 capsule, Rfl: 1   meclizine (ANTIVERT) 25 MG tablet, Take 25 mg by mouth 3 (three) times daily as needed (dizziness/vertigo). , Disp: , Rfl:    meloxicam (MOBIC) 15 MG tablet, Take 1 tablet (15 mg total) by mouth daily., Disp: 30 tablet, Rfl: 0   MUCUS RELIEF ER 600 MG 12 hr tablet, Take 600-1,200 mg by mouth 2 (two) times daily as needed (congestion). , Disp: , Rfl:    ondansetron (ZOFRAN-ODT) 4 MG disintegrating tablet, Take 1-2 tablets (4-8 mg total) by mouth every 8 (eight) hours as needed. May take with Rizatriptan, Disp: 30  tablet, Rfl: 3   pantoprazole (PROTONIX) 40 MG tablet, Take 1 tablet (40 mg total) by mouth at bedtime., Disp: 90 tablet, Rfl: 3   Pediatric Multiple Vit-C-FA (MULTIVITAMIN ANIMAL SHAPES, WITH CA/FA,) with C & FA chewable tablet, Chew 2 tablets by mouth daily., Disp: , Rfl:    Rimegepant Sulfate (NURTEC) 75 MG TBDP, Dissolve 1 tablet by mouth daily as needed for migraines. Take as close to onset of migraine as possible. One daily maximum., Disp: 16 tablet, Rfl: 11   rizatriptan (MAXALT-MLT) 10 MG disintegrating tablet, Dissolve 1 tablet (10 mg total) by mouth as needed for migraine. May repeat in 2 hours if needed, Disp: 27 tablet, Rfl: 4   simethicone (MYLICON) 80 MG chewable tablet, Chew 80 mg by mouth every 6 (six) hours as needed for flatulence., Disp: , Rfl:    tezepelumab-ekko (TEZSPIRE) 210 MG/1.91ML syringe, Inject 1.91 mLs (210 mg total) into the skin every 28 (twenty-eight) days., Disp: 1.91 mL, Rfl: 11   tiZANidine (ZANAFLEX) 4 MG tablet, Take 1 tablet by mouth every 8  hours as needed., Disp: 10 tablet, Rfl: 0   triamcinolone ointment (KENALOG) 0.1 %, APPLY TO AFFECTED AREA(S) 2 TIMES A DAY, Disp: 454 g, Rfl: 2   valACYclovir (VALTREX) 500 MG tablet, Take 500 mg by mouth 2 (two) times daily., Disp: , Rfl:    valACYclovir (VALTREX) 500 MG tablet, Take 2 tablets (1,000 mg total) by mouth 2 (two) times daily as needed for acute flares, Disp: 120 tablet, Rfl: 1   Vibegron (GEMTESA) 75 MG TABS, Take 1 tablet by mouth daily., Disp: 30 tablet, Rfl: 11   Vibegron (GEMTESA) 75 MG TABS, Take 1 tablet by mouth daily., Disp: 90 tablet, Rfl: 4   Vitamin D, Ergocalciferol, (DRISDOL) 1.25 MG (50000 UNIT) CAPS capsule, Take 1 capsule by mouth every 7 days., Disp: 12 capsule, Rfl: 3  Current Facility-Administered Medications:    tezepelumab-ekko (TEZSPIRE) 210 MG/1.91ML syringe 210 mg, 210 mg, Subcutaneous, Q28 days, Valentina Shaggy, MD, 210 mg at 07/24/22 1738   Objective:     Vitals:    08/08/22 0832  BP: 118/80  Pulse: 65  SpO2: 100%  Weight: 173 lb (78.5 kg)  Height: '5\' 6"'$  (1.676 m)      Body mass index is 27.92 kg/m.    Physical Exam:    General: Well-appearing, cooperative, sitting comfortably in no acute  distress.   OMT Physical Exam:  ASIS Compression Test: Positive Right Cervical: TTP paraspinal, C3-5 RLSL Rib: Bilateral elevated first rib with TTP, worse on left.  Bilaterally TTP trapezius, moderately worse on left. Thoracic: TTP paraspinal, T4-7 RRSL Lumbar: TTP paraspinal, L1-3 RRSL Pelvis: Right anterior innominate with out flare   Electronically signed by:  [name] D.[facility] 10:02 AM 08/08/22

## 2022-08-08 ENCOUNTER — Ambulatory Visit (INDEPENDENT_AMBULATORY_CARE_PROVIDER_SITE_OTHER): Payer: 59 | Admitting: Sports Medicine

## 2022-08-08 ENCOUNTER — Other Ambulatory Visit (HOSPITAL_COMMUNITY): Payer: Self-pay

## 2022-08-08 ENCOUNTER — Ambulatory Visit: Payer: 59 | Attending: Sports Medicine | Admitting: Physical Therapy

## 2022-08-08 ENCOUNTER — Encounter: Payer: Self-pay | Admitting: Physical Therapy

## 2022-08-08 VITALS — BP 118/80 | HR 65 | Ht 66.0 in | Wt 173.0 lb

## 2022-08-08 DIAGNOSIS — M5441 Lumbago with sciatica, right side: Secondary | ICD-10-CM

## 2022-08-08 DIAGNOSIS — M9902 Segmental and somatic dysfunction of thoracic region: Secondary | ICD-10-CM | POA: Diagnosis not present

## 2022-08-08 DIAGNOSIS — M542 Cervicalgia: Secondary | ICD-10-CM

## 2022-08-08 DIAGNOSIS — M9908 Segmental and somatic dysfunction of rib cage: Secondary | ICD-10-CM | POA: Diagnosis not present

## 2022-08-08 DIAGNOSIS — M25512 Pain in left shoulder: Secondary | ICD-10-CM | POA: Diagnosis not present

## 2022-08-08 DIAGNOSIS — M9901 Segmental and somatic dysfunction of cervical region: Secondary | ICD-10-CM | POA: Diagnosis not present

## 2022-08-08 DIAGNOSIS — G8929 Other chronic pain: Secondary | ICD-10-CM | POA: Diagnosis not present

## 2022-08-08 DIAGNOSIS — M9903 Segmental and somatic dysfunction of lumbar region: Secondary | ICD-10-CM

## 2022-08-08 DIAGNOSIS — R293 Abnormal posture: Secondary | ICD-10-CM | POA: Diagnosis not present

## 2022-08-08 DIAGNOSIS — M9905 Segmental and somatic dysfunction of pelvic region: Secondary | ICD-10-CM | POA: Diagnosis not present

## 2022-08-08 NOTE — Therapy (Signed)
OUTPATIENT PHYSICAL THERAPY TREATMENT NOTE   Patient Name: Donna Park MRN: [id number] DOB:13-Sep-1971, 51 y.o., female Today's Date: 08/08/2022  PCP: Dorothyann Peng REFERRING PROVIDER: [name]   PT End of Session - 08/08/22 1008     Visit Number 11    Date for PT Re-Evaluation 09/06/22    Authorization Type [employer]    Progress Note Due on Visit 10    PT Start Time 1003    PT Stop Time 7622    PT Time Calculation (min) 40 min    Activity Tolerance Patient tolerated treatment well    Behavior During Therapy WFL for tasks assessed/performed                  Past Medical History:  Diagnosis Date   Allergy    Anxiety    Arthritis    Asthma    Chicken pox    Complication of anesthesia    slow to wake up from anesthesia   Depression    DJD (degenerative joint disease)    GERD (gastroesophageal reflux disease)    resolved after gastric surgery   H/O gastric bypass 2016   History of iron deficiency anemia    HSV infection    Migraines    Obese    Pneumonia    Childhood history   PONV (postoperative nausea and vomiting)    Status post dilation of esophageal narrowing    Varicose vein of leg    Past Surgical History:  Procedure Laterality Date   ANKLE FRACTURE SURGERY Left 03/08/2009   BREAST SURGERY Bilateral    Cyst Removal   GASTRIC BYPASS  06/18/2015   LAPAROSCOPIC VAGINAL HYSTERECTOMY WITH SALPINGECTOMY Bilateral 05/15/2020   Procedure: LAPAROSCOPIC ASSISTED VAGINAL HYSTERECTOMY WITH SALPINGECTOMY;  Surgeon: Louretta Shorten, MD;  Location: [facility];  Service: Gynecology;  Laterality: Bilateral;  need bed   LASER ABLATION     Varicose veins   TONSILLECTOMY AND ADENOIDECTOMY  2001   TUBAL LIGATION  12/19/1999   UPPER GASTROINTESTINAL ENDOSCOPY     UPPER GI ENDOSCOPY     Uterine ablation  12/2018   Patient Active Problem List   Diagnosis Date Noted   LUQ pain 03/01/2022   Change in bowel habits 03/01/2022   Loss of weight  03/01/2022   Chronic migraine without aura without status migrainosus, not intractable 06/14/2021   S/P laparoscopic assisted vaginal hysterectomy (LAVH) 05/15/2020   Migraines    GERD (gastroesophageal reflux disease)    Asthma    Seasonal and perennial allergic rhinitis 02/10/2019   Moderate persistent asthma, uncomplicated 63/33/5456   Anaphylactic shock due to adverse food reaction 02/10/2019   Atopic dermatitis 02/10/2019   History of Roux-en-Y gastric bypass 2016    THERAPY DIAG:  Cervicalgia  Abnormal posture  REFERRING DIAG:  M54.2 (ICD-10-CM) - Neck pain  M99.01 (ICD-10-CM) - Somatic dysfunction of cervical region  M99.02 (ICD-10-CM) - Somatic dysfunction of thoracic region  M99.08 (ICD-10-CM) - Somatic dysfunction of rib region  M25.512 (ICD-10-CM) - Acute pain of left shoulder    PERTINENT HISTORY: repeat trigger point injections and has received OMT treatments with last being 05/23/22 by Glennon Mac, DO.  PRECAUTIONS/RESTRICTIONS:   No  SUBJECTIVE: Pt reports that her shoulder is benefiting from PT, but is a little sore this morning because she had an exam at ortho.   Pain: 6-7/10 L > R shoulder, parascapular, neck and low back Aggs: shoulder abd/ext combo Eases: rest   OBJECTIVE: PATIENT SURVEYS:  FOTO 40% function 07/03/2022: 32% function     COGNITION: Overall cognitive status: Within functional limits for tasks assessed     SENSATION: WFL   POSTURE:  sits with left lateral trunk lean, R shoulder heigher, R scapula lower, rounded shoulders bil, slight forward head, increased lumbar lordosis, L clavicle higher, L clavicular notch heigher, audible pop under L acromion    PALPATION: T2, T4-5, T7-8 hypomobile, pt with TTP along L Upper Trap,mid Trap, Teres; see posture section, coracoid process/acromion heigher left   CERVICAL ROM:    Active ROM A/PROM (deg) eval  Flexion WFL  Extension WFL  Right lateral flexion WFL  Left lateral flexion  WFL  Right rotation WFL  Left rotation WFL   (Blank rows = not tested)   UPPER EXTREMITY MMT:   MMT Right eval Left eval Left 07/03/2022 07/26/22  Shoulder flexion 4+/5 3+/5 with pain  4/5, p!  Shoulder extension       Shoulder abduction 4+/5 3+/5 4+/5  Shoulder adduction 4+/5 3+/5 4+/5, p!  Shoulder extension       Shoulder internal rotation 4+/5 3+/5 5/5  Shoulder external rotation 4+/5 3+/5 4+/5  Elbow flexion       Elbow extension       Wrist flexion       Wrist extension       Wrist ulnar deviation       Wrist radial deviation       Wrist pronation       Wrist supination        (Blank rows = not tested) mid-Trap,Teres 2-/5   UPPER EXTREMITY AROM:   MMT Right eval Left eval  Shoulder flexion [facility] with pain  Shoulder extension      Shoulder abduction [facility] WFL  Shoulder adduction [facility]  Shoulder extension      Shoulder internal rotation [facility]  Shoulder external rotation WFL WFL                                                                                (Blank rows = not tested)   CERVICAL SPECIAL TESTS:  NT    TODAY'S TREATMENT:   [facility] Adult PT Treatment:                                                DATE: 08/08/2022 Therapeutic Exercise: UBE 2.5'/2.5' fwd and backward for warm up while taking subjective Paloff press - 2x10 ea - 10 # Downward chop - 10# - 2x10 Prone TYWI 2x10 ea  Manual Therapy: Skilled palpation to identify trigger points prior to TPDN Effleurage/ STM to all musculature following TPDN   Trigger Point Dry-Needling  Treatment instructions: Expect mild to moderate muscle soreness. S/S of pneumothorax if dry needled over a lung field, and to seek immediate medical attention should they occur. Patient verbalized understanding of these instructions and education.  Patient Consent Given: Yes Education handout provided: No Muscles treated: BIL UT, BIL subscapularis, BIL C4-C5 cervical multifidi, BIL suboccipitals, BIL  infraspinatus, lumbar paraspinals (estim) Electrical stimulation  performed: NO Parameters: 5 min low frequency - milli amps - low intensity; 5 min - micro amps - high frequency high intensity. Treatment response/outcome: Improved muscle extensibility  [facility] Adult PT Treatment:                                                DATE: 08/06/2022 Therapeutic Exercise: UBE 2.5'/2.5' fwd and backward for warm up while taking subjective Seated low rows with 40# cable 2x10 Seated high rows with 40# cable 2x10 Seated lat pull-downs with 40# cable 2x10 Standing BIL shoulder scaption with 3# dumbbells 2x10 Prone TYWI 2x10 ea  Manual Therapy: Skilled palpation to identify trigger points prior to TPDN Effleurage/ STM to all musculature following TPDN   Trigger Point Dry-Needling  Treatment instructions: Expect mild to moderate muscle soreness. S/S of pneumothorax if dry needled over a lung field, and to seek immediate medical attention should they occur. Patient verbalized understanding of these instructions and education.  Patient Consent Given: Yes Education handout provided: No Muscles treated: BIL UT, BIL subscapularis, BIL C4-C5 cervical multifidi, BIL suboccipitals, BIL infraspinatus  Electrical stimulation performed: No Parameters: N/A Treatment response/outcome: Improved muscle extensibility  [facility] Adult PT Treatment:                                                DATE: 07/26/22 Therapeutic Exercise: NuStep level 4 x 5 min for warm up while taking subjective and monitoring for muscle substitution Seated low rows with 35# x 20 Seated high rows with 35# x 20 Seated lat pull-downs with 40# cable 2x10 Standing BIL shoulder scaption with 3# dumbbells 2x10 Freemotion L ER 3 lbs x 20 Freemotion row 20 lbs x 10, 17 lbs x 10 L  Freemotion L shoulder ext 3 lbs x 20 Seated sidebend stretch 2x30 sec each side L reverse fly 5 lbs x 10 with PT max verbal cues for technique GTB bil reverse fly/ext combo  x 20 near hips L scapular protraction stretch 2x30 sec    [facility] Adult PT Treatment:                                                DATE: 07/25/2022 Therapeutic Exercise: UBE 2.5'/2.5' fwd and backward for warm up while taking subjective Seated low rows with 40# cable 2x10 Seated high rows with 40# cable 2x10 Seated lat pull-downs with 40# cable 2x10 Standing BIL shoulder scaption with 3# dumbbells 2x10  Manual Therapy: Skilled palpation to identify trigger points prior to TPDN Effleurage/ STM to all musculature following TPDN   Trigger Point Dry-Needling  Treatment instructions: Expect mild to moderate muscle soreness. S/S of pneumothorax if dry needled over a lung field, and to seek immediate medical attention should they occur. Patient verbalized understanding of these instructions and education.  Patient Consent Given: Yes Education handout provided: No Muscles treated: BIL UT, BIL subscapularis, BIL C4-C5 cervical multifidi, BIL suboccipitals, BIL infraspinatus  Electrical stimulation performed: No Parameters: N/A Treatment response/outcome: Improved muscle extensibility  [facility] Adult PT Treatment:  DATE: 07/03/2022 Therapeutic Exercise: Seated low rows with 35# cable 2x10 Seated high rows with 35# cable 2x10 Seated lat pull-downs with 35# cable 2x10 Seated shoulder rolls 2x10 forward and backward Standing BIL shoulder scaption with 3# dumbbells 2x10 Manual Therapy: Skilled palpation to identify trigger points prior to TPDN Effleurage/ STM to all musculature following TPDN x10 minutes Trigger Point Dry-Needling  Treatment instructions: Expect mild to moderate muscle soreness. S/S of pneumothorax if dry needled over a lung field, and to seek immediate medical attention should they occur. Patient verbalized understanding of these instructions and education.  Patient Consent Given: Yes Education handout provided: No Muscles treated:  BIL UT, BIL subscapularis, BIL C4-C5 cervical multifidi, BIL suboccipitals, BIL infraspinatus  Electrical stimulation performed: No Parameters: N/A Treatment response/outcome: Improved muscle extensibility, reported decrease in pain from 6/10 to 4/10  Neuromuscular re-ed: N/A Therapeutic Activity: N/A Modalities: N/A Self Care: N/A  TREATMENT 07/01/2022:  Therapeutic Exercise: - UBE 2.5'/2.5' fwd and backward for warm up while taking subjective - low row - 35# - 2x6 - high row - 35# - 2x6 - lat pull down - 35# - 2x8 - 5# OH press - 2x10 ea - S/L ER - 3x10 - L - 2#  Manual therapy: Skilled palpation to identify trigger points for TDN (needle insertion not charged) STM of bil sub occipitals, Bil cervical paraspinals, Bil infraspinatus, Bil supraspinatus, Bil UT, Bil LS  Trigger Point Dry-Needling  Treatment instructions: Expect mild to moderate muscle soreness. S/S of pneumothorax if dry needled over a lung field, and to seek immediate medical attention should they occur. Patient verbalized understanding of these instructions and education.  Patient Consent Given: Yes Education handout provided: No Muscles treated: bil sub occipitals, bil cervical paraspinals, bil infraspinatus, bil supraspinatus, bil UT, bil subscap, bil LS Electrical stimulation performed: No Parameters: N/A Treatment response/outcome: twitch and pain relief  TREATMENT 06/24/2022:  Therapeutic Exercise: - UBE 2.5'/2.5' fwd and backward for warm up while taking subjective - low row - 30# - 2x6 - high row - 30# - 2x6 - lat pull down - 30# - 2x8 - 5# OH press - 2x10 ea - S/L ER - 3x10 - L    Manual therapy: Skilled palpation to identify trigger points for TDN (needle insertion not charged) STM of bil sub occipitals, L cervical paraspinals, L infraspinatus, L supraspinatus, L UT  Trigger Point Dry-Needling  Treatment instructions: Expect mild to moderate muscle soreness. S/S of pneumothorax if dry  needled over a lung field, and to seek immediate medical attention should they occur. Patient verbalized understanding of these instructions and education.  Patient Consent Given: Yes Education handout provided: No Muscles treated: bil sub occipitals, L cervical paraspinals, L infraspinatus, L supraspinatus, L UT, L subscap Electrical stimulation performed: No Parameters: N/A Treatment response/outcome: twitch and pain relief   ASSESSMENT: CLINICAL IMPRESSION: Patient tolerated therapy well with no adverse reaction.  We added in estim + TDN for lumbar spine today.  Pt reports continued relief from manual + TDN of shoulders and reports that her legs feel loser with reduced sciatic sxs following TDN of lumbar spine.  Added in combo shoulder and core strengthening exercises today to good effect.    OBJECTIVE IMPAIRMENTS decreased activity tolerance, decreased coordination, decreased mobility, decreased ROM, decreased strength, hypomobility, increased muscle spasms, impaired flexibility, impaired UE functional use, postural dysfunction, and pain.    ACTIVITY LIMITATIONS carrying, reach over head, and locomotion level   PARTICIPATION LIMITATIONS: meal prep, community activity, and occupation  PERSONAL FACTORS Fitness are also affecting patient's functional outcome.      GOALS: Goals reviewed with patient? Yes   SHORT TERM GOALS: Target date: 07/05/2022    Pt will be indep with initial HEP to assist with pain management Baseline: initiated at eval Goal status: MET   2.  Pt will report 25% improvement in L shoulder symptoms with functional activities at work Baseline: 100% present 7/26: 25% improvement Goal status: MET     LONG TERM GOALS: Target date:  09/06/22   Pt will be able to wipe down the scales with a circular motion at work without pain Baseline: 8/10; 07/26/22: 6/10 Goal status: ONGOING   2.  Pt will be able to perform left lateral cervical sidebend at work without  pain Baseline: 10/10; 07/26/22: 5/10 Goal status: ONGOING   3.  Pt will be able to mop/sweep without pain Baseline: 6/10; 07/26/22: 7-8/10 Goal status: ONGOING   4.  Pt will have improved FOTO score to >/=57% function Baseline: 40% function 07/03/2022: 32% Goal status: ONGOING   5.  Pt will demo improvement in L shoulder strength to >/= 4+/5 to assist with work duties Baseline: 3+/5 throughout except mid trap/teres 2-/5 07/03/2022: 4+/5 Goal status: ACHIEVED     PLAN: PT FREQUENCY: 2x/week   PT DURATION: other: 6 weeks   PLANNED INTERVENTIONS: Therapeutic exercises, Therapeutic activity, Neuromuscular re-education, Patient/Family education, Joint mobilization, Dry Needling, Moist heat, Taping, Ultrasound, Manual therapy, and Re-evaluation   PLAN FOR NEXT SESSION:  L clavicular/scapular mobs, manual therapy for L Upper Trap/Teres/Mid trap tightness; strengthening L shoulder, dry needling   Kevan Ny Jaeven Wanzer PT 08/08/22  11:08 AM Phone: [phone] Fax: [phone]

## 2022-08-08 NOTE — Patient Instructions (Addendum)
Good to see you  Low back HEP, pt referral  2 week follow up

## 2022-08-12 ENCOUNTER — Other Ambulatory Visit (HOSPITAL_COMMUNITY): Payer: Self-pay

## 2022-08-12 ENCOUNTER — Ambulatory Visit
Admission: RE | Admit: 2022-08-12 | Discharge: 2022-08-12 | Disposition: A | Discharge status: Home / Self Care | Payer: 59 | Source: Ambulatory Visit | Attending: Adult Health | Admitting: Adult Health

## 2022-08-12 DIAGNOSIS — M810 Age-related osteoporosis without current pathological fracture: Secondary | ICD-10-CM | POA: Diagnosis not present

## 2022-08-12 DIAGNOSIS — E2839 Other primary ovarian failure: Secondary | ICD-10-CM

## 2022-08-12 DIAGNOSIS — Z780 Asymptomatic menopausal state: Secondary | ICD-10-CM | POA: Diagnosis not present

## 2022-08-12 DIAGNOSIS — M85852 Other specified disorders of bone density and structure, left thigh: Secondary | ICD-10-CM | POA: Diagnosis not present

## 2022-08-16 ENCOUNTER — Ambulatory Visit: Payer: 59

## 2022-08-16 DIAGNOSIS — M542 Cervicalgia: Secondary | ICD-10-CM | POA: Diagnosis not present

## 2022-08-16 DIAGNOSIS — R293 Abnormal posture: Secondary | ICD-10-CM

## 2022-08-16 NOTE — Therapy (Signed)
OUTPATIENT PHYSICAL THERAPY TREATMENT NOTE   Patient Name: Ashea Winiarski MRN: [id number] DOB:[birth year], 51 y.o., female Today's Date: 08/16/2022  PCP: Dorothyann Peng REFERRING PROVIDER: [name]   PT End of Session - 08/16/22 1035     Visit Number 12    Date for PT Re-Evaluation 09/06/22    Authorization Type [employer]    Progress Note Due on Visit 10    PT Start Time 1034    PT Stop Time 1117   5 minutes TPDN insertion   PT Time Calculation (min) 43 min    Activity Tolerance Patient tolerated treatment well    Behavior During Therapy WFL for tasks assessed/performed                   Past Medical History:  Diagnosis Date   Allergy    Anxiety    Arthritis    Asthma    Chicken pox    Complication of anesthesia    slow to wake up from anesthesia   Depression    DJD (degenerative joint disease)    GERD (gastroesophageal reflux disease)    resolved after gastric surgery   H/O gastric bypass 2016   History of iron deficiency anemia    HSV infection    Migraines    Obese    Pneumonia    Childhood history   PONV (postoperative nausea and vomiting)    Status post dilation of esophageal narrowing    Varicose vein of leg    Past Surgical History:  Procedure Laterality Date   ANKLE FRACTURE SURGERY Left 03/08/2009   BREAST SURGERY Bilateral    Cyst Removal   GASTRIC BYPASS  06/18/2015   LAPAROSCOPIC VAGINAL HYSTERECTOMY WITH SALPINGECTOMY Bilateral 05/15/2020   Procedure: LAPAROSCOPIC ASSISTED VAGINAL HYSTERECTOMY WITH SALPINGECTOMY;  Surgeon: Louretta Shorten, MD;  Location: [place];  Service: Gynecology;  Laterality: Bilateral;  need bed   LASER ABLATION     Varicose veins   TONSILLECTOMY AND ADENOIDECTOMY  2001   TUBAL LIGATION  12/19/1999   UPPER GASTROINTESTINAL ENDOSCOPY     UPPER GI ENDOSCOPY     Uterine ablation  12/2018   Patient Active Problem List   Diagnosis Date Noted   LUQ pain 03/01/2022   Change in bowel habits 03/01/2022    Loss of weight 03/01/2022   Chronic migraine without aura without status migrainosus, not intractable 06/14/2021   S/P laparoscopic assisted vaginal hysterectomy (LAVH) 05/15/2020   Migraines    GERD (gastroesophageal reflux disease)    Asthma    Seasonal and perennial allergic rhinitis 02/10/2019   Moderate persistent asthma, uncomplicated 56/15/3794   Anaphylactic shock due to adverse food reaction 02/10/2019   Atopic dermatitis 02/10/2019   History of Roux-en-Y gastric bypass 2016    THERAPY DIAG:  Cervicalgia  Abnormal posture  REFERRING DIAG:  M54.2 (ICD-10-CM) - Neck pain  M99.01 (ICD-10-CM) - Somatic dysfunction of cervical region  M99.02 (ICD-10-CM) - Somatic dysfunction of thoracic region  M99.08 (ICD-10-CM) - Somatic dysfunction of rib region  M25.512 (ICD-10-CM) - Acute pain of left shoulder    PERTINENT HISTORY: repeat trigger point injections and has received OMT treatments with last being 05/23/22 by Glennon Mac, DO.  PRECAUTIONS/RESTRICTIONS:   No  SUBJECTIVE: Pt reports 3/10 Lt cervical pain today. She reports TPDN has been beneficial.   Pain: 3/10 L > R shoulder, parascapular, neck and low back Aggs: shoulder abd/ext combo Eases: rest   OBJECTIVE: PATIENT SURVEYS:  FOTO 40% function 07/03/2022:  32% function 08/16/2022: 44%     COGNITION: Overall cognitive status: Within functional limits for tasks assessed     SENSATION: WFL   POSTURE:  sits with left lateral trunk lean, R shoulder heigher, R scapula lower, rounded shoulders bil, slight forward head, increased lumbar lordosis, L clavicle higher, L clavicular notch heigher, audible pop under L acromion    PALPATION: T2, T4-5, T7-8 hypomobile, pt with TTP along L Upper Trap,mid Trap, Teres; see posture section, coracoid process/acromion heigher left   CERVICAL ROM:    Active ROM A/PROM (deg) eval  Flexion WFL  Extension WFL  Right lateral flexion WFL  Left lateral flexion WFL   Right rotation WFL  Left rotation WFL   (Blank rows = not tested)   UPPER EXTREMITY MMT:   MMT Right eval Left eval Left 07/03/2022 07/26/22  Shoulder flexion 4+/5 3+/5 with pain  4/5, p!  Shoulder extension       Shoulder abduction 4+/5 3+/5 4+/5  Shoulder adduction 4+/5 3+/5 4+/5, p!  Shoulder extension       Shoulder internal rotation 4+/5 3+/5 5/5  Shoulder external rotation 4+/5 3+/5 4+/5  Elbow flexion       Elbow extension       Wrist flexion       Wrist extension       Wrist ulnar deviation       Wrist radial deviation       Wrist pronation       Wrist supination        (Blank rows = not tested) mid-Trap,Teres 2-/5   UPPER EXTREMITY AROM:   MMT Right eval Left eval  Shoulder flexion [facility] with pain  Shoulder extension      Shoulder abduction [facility] WFL  Shoulder adduction [facility]  Shoulder extension      Shoulder internal rotation [facility]  Shoulder external rotation WFL WFL                                                                                (Blank rows = not tested)   CERVICAL SPECIAL TESTS:  NT    TODAY'S TREATMENT:  [facility] Adult PT Treatment:                                                DATE: 08/16/2022 Therapeutic Exercise: Sidelying lumbar open book x10 BIL Supine shoulder extension isometric into table with chin tuck 2x10 Supine 90/90 abdominal isometric with handhold resistance 3x30 seconds Manual Therapy: Skilled palpation to identify trigger points prior to TPDN Effleurage/ STM to all musculature following TPDN   Trigger Point Dry-Needling  Treatment instructions: Expect mild to moderate muscle soreness. S/S of pneumothorax if dry needled over a lung field, and to seek immediate medical attention should they occur. Patient verbalized understanding of these instructions and education.  Patient Consent Given: Yes Education handout provided: No Muscles treated: BIL UT, BIL subscapularis, BIL C4-C5 cervical multifidi, BIL  suboccipitals, lumbar paraspinals (estim) Electrical stimulation performed: YES Parameters: 5 min low frequency - milli amps -  low intensity; 5 min - micro amps - high frequency high intensity. Treatment response/outcome: Improved muscle extensibility Neuromuscular re-ed: N/A Therapeutic Activity: N/A Modalities: N/A Self Care: N/A   [facility] Adult PT Treatment:                                                DATE: 08/08/2022 Therapeutic Exercise: UBE 2.5'/2.5' fwd and backward for warm up while taking subjective Paloff press - 2x10 ea - 10 # Downward chop - 10# - 2x10 Prone TYWI 2x10 ea  Manual Therapy: Skilled palpation to identify trigger points prior to TPDN Effleurage/ STM to all musculature following TPDN   Trigger Point Dry-Needling  Treatment instructions: Expect mild to moderate muscle soreness. S/S of pneumothorax if dry needled over a lung field, and to seek immediate medical attention should they occur. Patient verbalized understanding of these instructions and education.  Patient Consent Given: Yes Education handout provided: No Muscles treated: BIL UT, BIL subscapularis, BIL C4-C5 cervical multifidi, BIL suboccipitals, BIL infraspinatus, lumbar paraspinals (estim) Electrical stimulation performed: NO Parameters: 5 min low frequency - milli amps - low intensity; 5 min - micro amps - high frequency high intensity. Treatment response/outcome: Improved muscle extensibility  [facility] Adult PT Treatment:                                                DATE: 08/06/2022 Therapeutic Exercise: UBE 2.5'/2.5' fwd and backward for warm up while taking subjective Seated low rows with 40# cable 2x10 Seated high rows with 40# cable 2x10 Seated lat pull-downs with 40# cable 2x10 Standing BIL shoulder scaption with 3# dumbbells 2x10 Prone TYWI 2x10 ea  Manual Therapy: Skilled palpation to identify trigger points prior to TPDN Effleurage/ STM to all musculature following TPDN   Trigger  Point Dry-Needling  Treatment instructions: Expect mild to moderate muscle soreness. S/S of pneumothorax if dry needled over a lung field, and to seek immediate medical attention should they occur. Patient verbalized understanding of these instructions and education.  Patient Consent Given: Yes Education handout provided: No Muscles treated: BIL UT, BIL subscapularis, BIL C4-C5 cervical multifidi, BIL suboccipitals, BIL infraspinatus  Electrical stimulation performed: No Parameters: N/A Treatment response/outcome: Improved muscle extensibility   ASSESSMENT: CLINICAL IMPRESSION: Pt reports continued improved symptoms with TPDN, particularly with combined E-stim/ TPDN. Multiple twitch responses were elicited in treated muscles, and improved muscle extensibility was noted following treatment. She demonstrates improved functional ability as indicated by her improved FOTO score. She will continue to benefit from skilled PT to address her primary impairments and return to her prior level of function with less limitation.    OBJECTIVE IMPAIRMENTS decreased activity tolerance, decreased coordination, decreased mobility, decreased ROM, decreased strength, hypomobility, increased muscle spasms, impaired flexibility, impaired UE functional use, postural dysfunction, and pain.    ACTIVITY LIMITATIONS carrying, reach over head, and locomotion level   PARTICIPATION LIMITATIONS: meal prep, community activity, and occupation   [place] are also affecting patient's functional outcome.      GOALS: Goals reviewed with patient? Yes   SHORT TERM GOALS: Target date: 07/05/2022    Pt will be indep with initial HEP to assist with pain management Baseline: initiated at eval Goal status: MET  2.  Pt will report 25% improvement in L shoulder symptoms with functional activities at work Baseline: 100% present 7/26: 25% improvement Goal status: MET     LONG TERM GOALS: Target date:   09/06/22   Pt will be able to wipe down the scales with a circular motion at work without pain Baseline: 8/10; 07/26/22: 6/10 Goal status: ONGOING   2.  Pt will be able to perform left lateral cervical sidebend at work without pain Baseline: 10/10; 07/26/22: 5/10 Goal status: ONGOING   3.  Pt will be able to mop/sweep without pain Baseline: 6/10; 07/26/22: 7-8/10 Goal status: ONGOING   4.  Pt will have improved FOTO score to >/=57% function Baseline: 40% function 07/03/2022: 32% 08/16/2022: 44% Goal status: ONGOING   5.  Pt will demo improvement in L shoulder strength to >/= 4+/5 to assist with work duties Baseline: 3+/5 throughout except mid trap/teres 2-/5 07/03/2022: 4+/5 Goal status: ACHIEVED     PLAN: PT FREQUENCY: 2x/week   PT DURATION: other: 6 weeks   PLANNED INTERVENTIONS: Therapeutic exercises, Therapeutic activity, Neuromuscular re-education, Patient/Family education, Joint mobilization, Dry Needling, Moist heat, Taping, Ultrasound, Manual therapy, and Re-evaluation   PLAN FOR NEXT SESSION:  L clavicular/scapular mobs, manual therapy for L Upper Trap/Teres/Mid trap tightness; strengthening L shoulder, dry needling   Vanessa Groves, PT, DPT 08/16/22 11:18 AM

## 2022-08-21 ENCOUNTER — Other Ambulatory Visit (HOSPITAL_COMMUNITY): Payer: Self-pay

## 2022-08-21 ENCOUNTER — Ambulatory Visit (INDEPENDENT_AMBULATORY_CARE_PROVIDER_SITE_OTHER): Payer: 59 | Admitting: *Deleted

## 2022-08-21 DIAGNOSIS — J4550 Severe persistent asthma, uncomplicated: Secondary | ICD-10-CM | POA: Diagnosis not present

## 2022-08-21 NOTE — Progress Notes (Signed)
Donna Park D.McColl Donna Park Phone: [phone]   Assessment and Plan:     1. Chronic bilateral low back pain with right-sided sciatica 2. Chronic left shoulder pain 3. Neck pain 4. Somatic dysfunction of cervical region 5. Somatic dysfunction of thoracic region 6. Somatic dysfunction of lumbar region 7. Somatic dysfunction of pelvic region 8. Somatic dysfunction of rib region -Chronic with exacerbation, subsequent visit - Recurrence of multiple musculoskeletal complaints with most prominent being in bilateral lower back, hips, left shoulder and trapezius -Continue physical therapy focusing on left shoulder and trapezius - Patient has received significant relief with OMT in the past.  Elects for repeat OMT today.  Tolerated well per note below. - Decision today to treat with OMT was based on Physical Exam   After verbal consent patient was treated with HVLA (high velocity low amplitude), ME (muscle energy), FPR (flex positional release), ST (soft tissue), PC/PD (Pelvic Compression/ Pelvic Decompression) techniques in cervical, rib, thoracic, lumbar, and pelvic areas. Patient tolerated the procedure well with improvement in symptoms.  Patient educated on potential side effects of soreness and recommended to rest, hydrate, and use Tylenol as needed for pain control.   9. Osteoporosis, unspecified osteoporosis type, unspecified pathological fracture presence  -New diagnosis, initial visit - Discussed with patient her recent DEXA scan indicative of osteoporosis based on T score -2.9. - Confirmed that patient was taking calcium and vitamin D supplementation - Discussed that with this T score, it is recommended that patient start on medical therapy.  Recommend that patient further discuss this with the ordering provider, however patient will be welcome to follow-up with me in clinic to discuss osteoporosis treatment in the  future  Pertinent previous records reviewed include DEXA scan 08/12/2022   Follow Up: 4 weeks for reevaluation.  Could consider repeat OMT versus further discussion of osteoporosis   Subjective:    I, Donna Park, am serving as a Education administrator for Doctor Glennon Mac   Chief Complaint: left shoulder pain and tightness   HPI:  01/31/2022 Patient is a 51 year old female complaining of neck and shoulder pain. Patient states been going on for about 2 weeks has knots in her shoulders both neck and shoulders have been stiff , interested in OMT , knots in her upper trap , low back and her sciatic nerve on her right side flares up when she's having intercourse goes down her leg and upper her back    02/21/2022 Patient states that her neck and her shoulders are still bothering her after her adjustment she was very tight is still tight in the upper back , still wants the adjustment will get a massage later tonight as well  , hip still hurts , her tailbone she woke up one morning with pain     03/06/2022 Patient states still has upper trap tightness, finder her self leaning on that left side    03/28/2022 Patient states that she is still tight time for an adjustment would like to discuss a lower spot for CSI    04/25/2022 Patient states that her neck is heavy, would like a referral for MRI, still neck tightness and pain,   05/23/2022 Patient states she is ready for the OMT but the left shoulder clavicle have been sore, neck and shoulder still hurts. Patient has had the MRI of the ankle this morning. Patient wanting to know if she can get another shoulder injection today  06/20/2022 Patient states that she is a little better decreased ROM but is going to PT , ankle feels better bought different shoes    08/08/2022  Patient states shoulder still hurts has been dry needling and want to talk talk about lidocaine again and then low back pain want referral for PT and sciatic pain    08/29/2022 Patient  states wants OMT, knot still hurts    Relevant Historical Information: History of migraines being treated with Botox injections  Additional pertinent review of systems negative.  Current Outpatient Medications  Medication Sig Dispense Refill   acetaminophen (TYLENOL) 500 MG tablet Take by mouth.     acyclovir ointment (ZOVIRAX) 5 % Apply onto the affected area every 3 hours. 15 g 1   albuterol (VENTOLIN HFA) 108 (90 Base) MCG/ACT inhaler Inhale 1 - 2 puffs into the lungs every 6 hours as needed for wheezing or shortness of breath. 20.1 g 0   amitriptyline (ELAVIL) 10 MG tablet Take 1 tablet (10 mg total) by mouth at bedtime. 90 tablet 1   azelastine (ASTELIN) 0.1 % nasal spray Place 2 sprays into both nostrils 2 (two) times daily. 30 mL 5   Azelastine-Fluticasone (DYMISTA) 137-50 MCG/ACT SUSP Place 2 sprays into both nostrils 2 (two) times daily as needed. 23 g 5   CALCIUM PO Take 1 tablet by mouth 4 (four) times a week.     cetirizine (ZYRTEC) 10 MG tablet Take 2 tablets (20 mg total) by mouth 2 (two) times daily. 360 tablet 1   Cyanocobalamin (NASCOBAL NA) Place 1 spray into the nose every Sunday.     diazepam (VALIUM) 10 MG tablet Take 1 tablet (10 mg total) by mouth at bedtime as needed for sleep. 30 tablet 2   EPINEPHrine 0.3 mg/0.3 mL IJ SOAJ injection Inject 0.3 mg into the muscle as needed for anaphylaxis. 2 each 1   estradiol (ESTRACE) 2 MG tablet Take 1 tablet (2 mg total) by mouth daily. 90 tablet 12   estradiol (ESTRACE) 2 MG tablet Take 1 tablet (2 mg total) by mouth daily. 30 tablet 12   fluconazole (DIFLUCAN) 150 MG tablet Take 1 tablet (150 mg total) by mouth daily. 14 tablet 2   fluticasone (FLONASE) 50 MCG/ACT nasal spray Place 2 sprays into both nostrils daily. 16 g 5   Fluticasone-Umeclidin-Vilant (TRELEGY ELLIPTA) 200-62.5-25 MCG/ACT AEPB Inhale 1 puff into the lungs daily. 60 each 5   Galcanezumab-gnlm (EMGALITY) 120 MG/ML SOAJ Inject 120 mg into the skin every 30  (thirty) days. 3 mL 0   Hypertonic Nasal Wash (SINUS RINSE REFILL) PACK Use one packet dissolved in water as needed. (Patient taking differently: Place 1 each into the nose in the morning and at bedtime.) 100 each 5   ibuprofen (ADVIL) 800 MG tablet Take 1 tablet (800 mg total) by mouth 3 (three) times daily as needed (pain.). 270 tablet 0   ipratropium (ATROVENT) 0.06 % nasal spray Place 2 sprays into both nostrils 4 (four) times daily. 45 mL 3   linaclotide (LINZESS) 145 MCG CAPS capsule Take 1 capsule (145 mcg total) by mouth daily before breakfast. 60 capsule 3   linaclotide (LINZESS) 290 MCG CAPS capsule Take 1 capsule (290 mcg total) by mouth daily before breakfast. 90 capsule 1   meclizine (ANTIVERT) 25 MG tablet Take 25 mg by mouth 3 (three) times daily as needed (dizziness/vertigo).      meloxicam (MOBIC) 15 MG tablet Take 1 tablet (15 mg total) by mouth daily.  30 tablet 0   MUCUS RELIEF ER 600 MG 12 hr tablet Take 600-1,200 mg by mouth 2 (two) times daily as needed (congestion).      ondansetron (ZOFRAN-ODT) 4 MG disintegrating tablet Take 1-2 tablets (4-8 mg total) by mouth every 8 (eight) hours as needed. May take with Rizatriptan 30 tablet 3   pantoprazole (PROTONIX) 40 MG tablet Take 1 tablet (40 mg total) by mouth at bedtime. 90 tablet 3   Pediatric Multiple Vit-C-FA (MULTIVITAMIN ANIMAL SHAPES, WITH CA/FA,) with C & FA chewable tablet Chew 2 tablets by mouth daily.     Rimegepant Sulfate (NURTEC) 75 MG TBDP Dissolve 1 tablet by mouth daily as needed for migraines. Take as close to onset of migraine as possible. One daily maximum. 16 tablet 11   rizatriptan (MAXALT-MLT) 10 MG disintegrating tablet Dissolve 1 tablet (10 mg total) by mouth as needed for migraine. May repeat in 2 hours if needed 27 tablet 4   simethicone (MYLICON) 80 MG chewable tablet Chew 80 mg by mouth every 6 (six) hours as needed for flatulence.     tezepelumab-ekko (TEZSPIRE) 210 MG/1.91ML syringe Inject 1.91 mLs  (210 mg total) into the skin every 28 (twenty-eight) days. 1.91 mL 11   tiZANidine (ZANAFLEX) 4 MG tablet Take 1 tablet by mouth every 8  hours as needed. 10 tablet 0   triamcinolone ointment (KENALOG) 0.1 % APPLY TO AFFECTED AREA(S) 2 TIMES A DAY 454 g 2   valACYclovir (VALTREX) 500 MG tablet Take 500 mg by mouth 2 (two) times daily.     valACYclovir (VALTREX) 500 MG tablet Take 2 tablets (1,000 mg total) by mouth 2 (two) times daily as needed for acute flares 120 tablet 1   Vibegron (GEMTESA) 75 MG TABS Take 1 tablet by mouth daily. 30 tablet 11   Vibegron (GEMTESA) 75 MG TABS Take 1 tablet by mouth daily. 90 tablet 4   Vitamin D, Ergocalciferol, (DRISDOL) 1.25 MG (50000 UNIT) CAPS capsule Take 1 capsule by mouth every 7 days. 12 capsule 3   fluticasone-salmeterol (ADVAIR HFA) 230-21 MCG/ACT inhaler INHALE 2 PUFFS INTO THE LUNGS 2 (TWO) TIMES DAILY. 36 g 1   Current Facility-Administered Medications  Medication Dose Route Frequency Provider Last Rate Last Admin   tezepelumab-ekko (TEZSPIRE) 210 MG/1.91ML syringe 210 mg  210 mg Subcutaneous Q28 days Valentina Shaggy, MD   210 mg at 08/21/22 1810      Objective:     Vitals:   08/29/22 0848  BP: 110/70  Pulse: 84  SpO2: 99%  Weight: 167 lb (75.8 kg)  Height: '5\' 6"'$  (1.676 m)      Body mass index is 26.95 kg/m.    Physical Exam:     General: Well-appearing, cooperative, sitting comfortably in no acute distress.   OMT Physical Exam:  ASIS Compression Test: Positive Right Cervical: TTP paraspinal, C3-5 RRSR Rib: Bilateral elevated first rib with TTP Thoracic: TTP paraspinal, T5-9 RLSR Lumbar: TTP paraspinal, L1-3 RRSL Pelvis: Right anterior innominate  Electronically signed by:  Donna Park D.[facility] 9:22 AM 08/29/22

## 2022-08-22 ENCOUNTER — Ambulatory Visit: Payer: 59 | Admitting: Sports Medicine

## 2022-08-23 ENCOUNTER — Ambulatory Visit: Payer: 59

## 2022-08-23 DIAGNOSIS — R293 Abnormal posture: Secondary | ICD-10-CM

## 2022-08-23 DIAGNOSIS — M542 Cervicalgia: Secondary | ICD-10-CM

## 2022-08-23 NOTE — Therapy (Signed)
OUTPATIENT PHYSICAL THERAPY TREATMENT NOTE   Patient Name: Donna Park MRN: [id number] DOB:1971/06/11, 51 y.o., female Today's Date: 08/23/2022  PCP: Dorothyann Peng REFERRING PROVIDER: [name]   PT End of Session - 08/23/22 1042     Visit Number 13    Date for PT Re-Evaluation 09/06/22    Authorization Type UMR    Progress Note Due on Visit 10    PT Start Time 2620   Pt arrived 11 minutes late to her appointment.   PT Stop Time 1113    PT Time Calculation (min) 30 min    Activity Tolerance Patient tolerated treatment well    Behavior During Therapy WFL for tasks assessed/performed                    Past Medical History:  Diagnosis Date   Allergy    Anxiety    Arthritis    Asthma    Chicken pox    Complication of anesthesia    slow to wake up from anesthesia   Depression    DJD (degenerative joint disease)    GERD (gastroesophageal reflux disease)    resolved after gastric surgery   H/O gastric bypass 2016   History of iron deficiency anemia    HSV infection    Migraines    Obese    Pneumonia    Childhood history   PONV (postoperative nausea and vomiting)    Status post dilation of esophageal narrowing    Varicose vein of leg    Past Surgical History:  Procedure Laterality Date   ANKLE FRACTURE SURGERY Left 03/08/2009   BREAST SURGERY Bilateral    Cyst Removal   GASTRIC BYPASS  06/18/2015   LAPAROSCOPIC VAGINAL HYSTERECTOMY WITH SALPINGECTOMY Bilateral 05/15/2020   Procedure: LAPAROSCOPIC ASSISTED VAGINAL HYSTERECTOMY WITH SALPINGECTOMY;  Surgeon: Louretta Shorten, MD;  Location: [facility];  Service: Gynecology;  Laterality: Bilateral;  need bed   LASER ABLATION     Varicose veins   TONSILLECTOMY AND ADENOIDECTOMY  2001   TUBAL LIGATION  12/19/1999   UPPER GASTROINTESTINAL ENDOSCOPY     UPPER GI ENDOSCOPY     Uterine ablation  12/2018   Patient Active Problem List   Diagnosis Date Noted   LUQ pain 03/01/2022   Change  in bowel habits 03/01/2022   Loss of weight 03/01/2022   Chronic migraine without aura without status migrainosus, not intractable 06/14/2021   S/P laparoscopic assisted vaginal hysterectomy (LAVH) 05/15/2020   Migraines    GERD (gastroesophageal reflux disease)    Asthma    Seasonal and perennial allergic rhinitis 02/10/2019   Moderate persistent asthma, uncomplicated 35/59/7416   Anaphylactic shock due to adverse food reaction 02/10/2019   Atopic dermatitis 02/10/2019   History of Roux-en-Y gastric bypass 2016    THERAPY DIAG:  Cervicalgia  Abnormal posture  REFERRING DIAG:  M54.2 (ICD-10-CM) - Neck pain  M99.01 (ICD-10-CM) - Somatic dysfunction of cervical region  M99.02 (ICD-10-CM) - Somatic dysfunction of thoracic region  M99.08 (ICD-10-CM) - Somatic dysfunction of rib region  M25.512 (ICD-10-CM) - Acute pain of left shoulder    PERTINENT HISTORY: repeat trigger point injections and has received OMT treatments with last being 05/23/22 by Glennon Mac, DO.  PRECAUTIONS/RESTRICTIONS:   No  SUBJECTIVE: Pt reports doing increased lifting last night, although she reports her shoulders were able to complete increased activity with less limitation.   Pain: 3/10 L > R shoulder, parascapular, neck and low back Aggs: shoulder abd/ext  combo Eases: rest   OBJECTIVE: PATIENT SURVEYS:  FOTO 40% function 07/03/2022: 32% function 08/16/2022: 44%     COGNITION: Overall cognitive status: Within functional limits for tasks assessed     SENSATION: WFL   POSTURE:  sits with left lateral trunk lean, R shoulder heigher, R scapula lower, rounded shoulders bil, slight forward head, increased lumbar lordosis, L clavicle higher, L clavicular notch heigher, audible pop under L acromion    PALPATION: T2, T4-5, T7-8 hypomobile, pt with TTP along L Upper Trap,mid Trap, Teres; see posture section, coracoid process/acromion heigher left   CERVICAL ROM:    Active ROM A/PROM  (deg) eval  Flexion WFL  Extension WFL  Right lateral flexion WFL  Left lateral flexion WFL  Right rotation WFL  Left rotation WFL   (Blank rows = not tested)   UPPER EXTREMITY MMT:   MMT Right eval Left eval Left 07/03/2022 07/26/22  Shoulder flexion 4+/5 3+/5 with pain  4/5, p!  Shoulder extension       Shoulder abduction 4+/5 3+/5 4+/5  Shoulder adduction 4+/5 3+/5 4+/5, p!  Shoulder extension       Shoulder internal rotation 4+/5 3+/5 5/5  Shoulder external rotation 4+/5 3+/5 4+/5  Elbow flexion       Elbow extension       Wrist flexion       Wrist extension       Wrist ulnar deviation       Wrist radial deviation       Wrist pronation       Wrist supination        (Blank rows = not tested) mid-Trap,Teres 2-/5   UPPER EXTREMITY AROM:   MMT Right eval Left eval  Shoulder flexion [facility] with pain  Shoulder extension      Shoulder abduction [facility] WFL  Shoulder adduction [facility]  Shoulder extension      Shoulder internal rotation [facility]  Shoulder external rotation WFL WFL                                                                                (Blank rows = not tested)   CERVICAL SPECIAL TESTS:  NT    TODAY'S TREATMENT:  [facility] Adult PT Treatment:                                                DATE: 08/23/2022 Therapeutic Exercise: Seated low rows with 35# cable 2x10 Seated high rows with 35# cable 2x10 Seated lat pull-downs with 35# cable 2x10 Seated shoulder rolls with chin tuck 2x10 forward and backward Standing BIL shoulder scaption with 4# dumbbells 3x10 Knee planks x3 to failure Manual Therapy: N/A Neuromuscular re-ed: N/A Therapeutic Activity: N/A Modalities: N/A Self Care: N/A   [facility] Adult PT Treatment:  DATE: 08/16/2022 Therapeutic Exercise: Sidelying lumbar open book x10 BIL Supine shoulder extension isometric into table with chin tuck 2x10 Supine 90/90 abdominal isometric  with handhold resistance 3x30 seconds Manual Therapy: Skilled palpation to identify trigger points prior to TPDN Effleurage/ STM to all musculature following TPDN   Trigger Point Dry-Needling  Treatment instructions: Expect mild to moderate muscle soreness. S/S of pneumothorax if dry needled over a lung field, and to seek immediate medical attention should they occur. Patient verbalized understanding of these instructions and education.  Patient Consent Given: Yes Education handout provided: No Muscles treated: BIL UT, BIL subscapularis, BIL C4-C5 cervical multifidi, BIL suboccipitals, lumbar paraspinals (estim) Electrical stimulation performed: YES Parameters: 5 min low frequency - milli amps - low intensity; 5 min - micro amps - high frequency high intensity. Treatment response/outcome: Improved muscle extensibility Neuromuscular re-ed: N/A Therapeutic Activity: N/A Modalities: N/A Self Care: N/A   [facility] Adult PT Treatment:                                                DATE: 08/08/2022 Therapeutic Exercise: UBE 2.5'/2.5' fwd and backward for warm up while taking subjective Paloff press - 2x10 ea - 10 # Downward chop - 10# - 2x10 Prone TYWI 2x10 ea  Manual Therapy: Skilled palpation to identify trigger points prior to TPDN Effleurage/ STM to all musculature following TPDN   Trigger Point Dry-Needling  Treatment instructions: Expect mild to moderate muscle soreness. S/S of pneumothorax if dry needled over a lung field, and to seek immediate medical attention should they occur. Patient verbalized understanding of these instructions and education.  Patient Consent Given: Yes Education handout provided: No Muscles treated: BIL UT, BIL subscapularis, BIL C4-C5 cervical multifidi, BIL suboccipitals, BIL infraspinatus, lumbar paraspinals (estim) Electrical stimulation performed: NO Parameters: 5 min low frequency - milli amps - low intensity; 5 min - micro amps - high frequency high  intensity. Treatment response/outcome: Improved muscle extensibility    ASSESSMENT: CLINICAL IMPRESSION: Pt responded well to all interventions today, demonstrating good form and no increase in pain with progressed strengthening exercises today. She will continue to benefit from skilled PT to address her primary impairments and return to her prior level of function with less limitation.    OBJECTIVE IMPAIRMENTS decreased activity tolerance, decreased coordination, decreased mobility, decreased ROM, decreased strength, hypomobility, increased muscle spasms, impaired flexibility, impaired UE functional use, postural dysfunction, and pain.    ACTIVITY LIMITATIONS carrying, reach over head, and locomotion level   PARTICIPATION LIMITATIONS: meal prep, community activity, and occupation   [place] are also affecting patient's functional outcome.      GOALS: Goals reviewed with patient? Yes   SHORT TERM GOALS: Target date: 07/05/2022    Pt will be indep with initial HEP to assist with pain management Baseline: initiated at eval Goal status: MET   2.  Pt will report 25% improvement in L shoulder symptoms with functional activities at work Baseline: 100% present 7/26: 25% improvement Goal status: MET     LONG TERM GOALS: Target date:  09/06/22   Pt will be able to wipe down the scales with a circular motion at work without pain Baseline: 8/10; 07/26/22: 6/10 Goal status: ONGOING   2.  Pt will be able to perform left lateral cervical sidebend at work without pain Baseline: 10/10; 07/26/22: 5/10 Goal status: ONGOING  3.  Pt will be able to mop/sweep without pain Baseline: 6/10; 07/26/22: 7-8/10 Goal status: ONGOING   4.  Pt will have improved FOTO score to >/=57% function Baseline: 40% function 07/03/2022: 32% 08/16/2022: 44% Goal status: ONGOING   5.  Pt will demo improvement in L shoulder strength to >/= 4+/5 to assist with work duties Baseline: 3+/5 throughout  except mid trap/teres 2-/5 07/03/2022: 4+/5 Goal status: ACHIEVED     PLAN: PT FREQUENCY: 2x/week   PT DURATION: other: 6 weeks   PLANNED INTERVENTIONS: Therapeutic exercises, Therapeutic activity, Neuromuscular re-education, Patient/Family education, Joint mobilization, Dry Needling, Moist heat, Taping, Ultrasound, Manual therapy, and Re-evaluation   PLAN FOR NEXT SESSION:  L clavicular/scapular mobs, manual therapy for L Upper Trap/Teres/Mid trap tightness; strengthening L shoulder, dry needling   Vanessa [place], PT, DPT 08/23/22 11:14 AM

## 2022-08-29 ENCOUNTER — Ambulatory Visit: Payer: 59

## 2022-08-29 ENCOUNTER — Ambulatory Visit (INDEPENDENT_AMBULATORY_CARE_PROVIDER_SITE_OTHER): Payer: 59 | Admitting: Sports Medicine

## 2022-08-29 VITALS — BP 110/70 | HR 84 | Ht 66.0 in | Wt 167.0 lb

## 2022-08-29 DIAGNOSIS — G8929 Other chronic pain: Secondary | ICD-10-CM | POA: Diagnosis not present

## 2022-08-29 DIAGNOSIS — M9905 Segmental and somatic dysfunction of pelvic region: Secondary | ICD-10-CM | POA: Diagnosis not present

## 2022-08-29 DIAGNOSIS — R293 Abnormal posture: Secondary | ICD-10-CM

## 2022-08-29 DIAGNOSIS — M5441 Lumbago with sciatica, right side: Secondary | ICD-10-CM

## 2022-08-29 DIAGNOSIS — M542 Cervicalgia: Secondary | ICD-10-CM | POA: Diagnosis not present

## 2022-08-29 DIAGNOSIS — M25512 Pain in left shoulder: Secondary | ICD-10-CM | POA: Diagnosis not present

## 2022-08-29 DIAGNOSIS — M9902 Segmental and somatic dysfunction of thoracic region: Secondary | ICD-10-CM

## 2022-08-29 DIAGNOSIS — M9901 Segmental and somatic dysfunction of cervical region: Secondary | ICD-10-CM | POA: Diagnosis not present

## 2022-08-29 DIAGNOSIS — M9903 Segmental and somatic dysfunction of lumbar region: Secondary | ICD-10-CM

## 2022-08-29 DIAGNOSIS — M810 Age-related osteoporosis without current pathological fracture: Secondary | ICD-10-CM

## 2022-08-29 DIAGNOSIS — M9908 Segmental and somatic dysfunction of rib cage: Secondary | ICD-10-CM | POA: Diagnosis not present

## 2022-08-29 NOTE — Patient Instructions (Addendum)
Good to see you 4 week follow up MSK  

## 2022-08-29 NOTE — Therapy (Signed)
OUTPATIENT PHYSICAL THERAPY TREATMENT NOTE   Patient Name: Donna Park MRN: [id number] DOB:November 03, 1971, 51 y.o., female Today's Date: 08/29/2022  PCP: Dorothyann Peng REFERRING PROVIDER: [name]   PT End of Session - 08/29/22 1001     Visit Number 14    Date for PT Re-Evaluation 09/06/22    Authorization Type UMR    Progress Note Due on Visit 10    PT Start Time 1002    PT Stop Time 1045   5 minutes TPDN   PT Time Calculation (min) 43 min    Activity Tolerance Patient tolerated treatment well    Behavior During Therapy WFL for tasks assessed/performed                     Past Medical History:  Diagnosis Date   Allergy    Anxiety    Arthritis    Asthma    Chicken pox    Complication of anesthesia    slow to wake up from anesthesia   Depression    DJD (degenerative joint disease)    GERD (gastroesophageal reflux disease)    resolved after gastric surgery   H/O gastric bypass 2016   History of iron deficiency anemia    HSV infection    Migraines    Obese    Pneumonia    Childhood history   PONV (postoperative nausea and vomiting)    Status post dilation of esophageal narrowing    Varicose vein of leg    Past Surgical History:  Procedure Laterality Date   ANKLE FRACTURE SURGERY Left 03/08/2009   BREAST SURGERY Bilateral    Cyst Removal   GASTRIC BYPASS  06/18/2015   LAPAROSCOPIC VAGINAL HYSTERECTOMY WITH SALPINGECTOMY Bilateral 05/15/2020   Procedure: LAPAROSCOPIC ASSISTED VAGINAL HYSTERECTOMY WITH SALPINGECTOMY;  Surgeon: Louretta Shorten, MD;  Location: [facility];  Service: Gynecology;  Laterality: Bilateral;  need bed   LASER ABLATION     Varicose veins   TONSILLECTOMY AND ADENOIDECTOMY  2001   TUBAL LIGATION  12/19/1999   UPPER GASTROINTESTINAL ENDOSCOPY     UPPER GI ENDOSCOPY     Uterine ablation  12/2018   Patient Active Problem List   Diagnosis Date Noted   LUQ pain 03/01/2022   Change in bowel habits 03/01/2022    Loss of weight 03/01/2022   Chronic migraine without aura without status migrainosus, not intractable 06/14/2021   S/P laparoscopic assisted vaginal hysterectomy (LAVH) 05/15/2020   Migraines    GERD (gastroesophageal reflux disease)    Asthma    Seasonal and perennial allergic rhinitis 02/10/2019   Moderate persistent asthma, uncomplicated 78/29/5621   Anaphylactic shock due to adverse food reaction 02/10/2019   Atopic dermatitis 02/10/2019   History of Roux-en-Y gastric bypass 2016    THERAPY DIAG:  Cervicalgia  Abnormal posture  REFERRING DIAG:  M54.2 (ICD-10-CM) - Neck pain  M99.01 (ICD-10-CM) - Somatic dysfunction of cervical region  M99.02 (ICD-10-CM) - Somatic dysfunction of thoracic region  M99.08 (ICD-10-CM) - Somatic dysfunction of rib region  M25.512 (ICD-10-CM) - Acute pain of left shoulder    PERTINENT HISTORY: repeat trigger point injections and has received OMT treatments with last being 05/23/22 by Glennon Mac, DO.  PRECAUTIONS/RESTRICTIONS:   No  SUBJECTIVE: Pt reports continued tightness today, and she rates her upper back and neck pain as 8/10. She also reports receiving her bone density scan results, which indicated osteoporosis.   Pain: 8/10 L > R shoulder, parascapular, neck and low back  Aggs: shoulder abd/ext combo Eases: rest   OBJECTIVE: PATIENT SURVEYS:  FOTO 40% function 07/03/2022: 32% function 08/16/2022: 44%     COGNITION: Overall cognitive status: Within functional limits for tasks assessed     SENSATION: WFL   POSTURE:  sits with left lateral trunk lean, R shoulder heigher, R scapula lower, rounded shoulders bil, slight forward head, increased lumbar lordosis, L clavicle higher, L clavicular notch heigher, audible pop under L acromion    PALPATION: T2, T4-5, T7-8 hypomobile, pt with TTP along L Upper Trap,mid Trap, Teres; see posture section, coracoid process/acromion heigher left   CERVICAL ROM:    Active ROM A/PROM  (deg) eval  Flexion WFL  Extension WFL  Right lateral flexion WFL  Left lateral flexion WFL  Right rotation WFL  Left rotation WFL   (Blank rows = not tested)   UPPER EXTREMITY MMT:   MMT Right eval Left eval Left 07/03/2022 07/26/22  Shoulder flexion 4+/5 3+/5 with pain  4/5, p!  Shoulder extension       Shoulder abduction 4+/5 3+/5 4+/5  Shoulder adduction 4+/5 3+/5 4+/5, p!  Shoulder extension       Shoulder internal rotation 4+/5 3+/5 5/5  Shoulder external rotation 4+/5 3+/5 4+/5  Elbow flexion       Elbow extension       Wrist flexion       Wrist extension       Wrist ulnar deviation       Wrist radial deviation       Wrist pronation       Wrist supination        (Blank rows = not tested) mid-Trap,Teres 2-/5   UPPER EXTREMITY AROM:   MMT Right eval Left eval  Shoulder flexion [facility] with pain  Shoulder extension      Shoulder abduction [facility] WFL  Shoulder adduction [facility]  Shoulder extension      Shoulder internal rotation [facility]  Shoulder external rotation WFL WFL                                                                                (Blank rows = not tested)   CERVICAL SPECIAL TESTS:  NT    TODAY'S TREATMENT:  [facility] Adult PT Treatment:                                                DATE: 08/29/2022 Therapeutic Exercise: Seated levator scap stretch x11mn BIL Seated low rows with 40# cable 2x10 Seated high rows with 40# cable 2x10 Seated lat pull-downs with 40# cable 2x10 Seated shoulder rolls with chin tuck 2x10 forward and backward Standing BIL shoulder scaption with 3# dumbbells and with chin tuck isometric into ball at wall 3x10 Manual Therapy: Skilled palpation to identify trigger points prior to TPDN Effleurage/ STM to all musculature following TPDN   Trigger Point Dry-Needling  Treatment instructions: Expect mild to moderate muscle soreness. S/S of pneumothorax if dry needled over a lung field, and to seek immediate  medical attention should they  occur. Patient verbalized understanding of these instructions and education.  Patient Consent Given: Yes Education handout provided: No Muscles treated: BIL UT, BIL subscapularis, BIL C4-C5 cervical multifidi, BIL suboccipitals, lumbar paraspinals (estim) Electrical stimulation performed: YES Parameters: 5 min low frequency - milli amps - low intensity; 5 min - micro amps - high frequency high intensity. Treatment response/outcome: Improved muscle extensibility Neuromuscular re-ed: N/A Therapeutic Activity: N/A Modalities: N/A Self Care: N/A   [facility] Adult PT Treatment:                                                DATE: 08/23/2022 Therapeutic Exercise: Seated low rows with 35# cable 2x10 Seated high rows with 35# cable 2x10 Seated lat pull-downs with 35# cable 2x10 Seated shoulder rolls with chin tuck 2x10 forward and backward Standing BIL shoulder scaption with 4# dumbbells 3x10 Knee planks x3 to failure Manual Therapy: N/A Neuromuscular re-ed: N/A Therapeutic Activity: N/A Modalities: N/A Self Care: N/A   [facility] Adult PT Treatment:                                                DATE: 08/16/2022 Therapeutic Exercise: Sidelying lumbar open book x10 BIL Supine shoulder extension isometric into table with chin tuck 2x10 Supine 90/90 abdominal isometric with handhold resistance 3x30 seconds Manual Therapy: Skilled palpation to identify trigger points prior to TPDN Effleurage/ STM to all musculature following TPDN   Trigger Point Dry-Needling  Treatment instructions: Expect mild to moderate muscle soreness. S/S of pneumothorax if dry needled over a lung field, and to seek immediate medical attention should they occur. Patient verbalized understanding of these instructions and education.  Patient Consent Given: Yes Education handout provided: No Muscles treated: BIL UT, BIL subscapularis, BIL C4-C5 cervical multifidi, BIL suboccipitals, lumbar  paraspinals (estim) Electrical stimulation performed: YES Parameters: 5 min low frequency - milli amps - low intensity; 5 min - micro amps - high frequency high intensity. Treatment response/outcome: Improved muscle extensibility Neuromuscular re-ed: N/A Therapeutic Activity: N/A Modalities: N/A Self Care: N/A      ASSESSMENT: CLINICAL IMPRESSION: Pt continues to respond well to PT, reporting further improvement in pain and tightness following TPDN, including with e-stim combo.     OBJECTIVE IMPAIRMENTS decreased activity tolerance, decreased coordination, decreased mobility, decreased ROM, decreased strength, hypomobility, increased muscle spasms, impaired flexibility, impaired UE functional use, postural dysfunction, and pain.    ACTIVITY LIMITATIONS carrying, reach over head, and locomotion level   PARTICIPATION LIMITATIONS: meal prep, community activity, and occupation   [place] are also affecting patient's functional outcome.      GOALS: Goals reviewed with patient? Yes   SHORT TERM GOALS: Target date: 07/05/2022    Pt will be indep with initial HEP to assist with pain management Baseline: initiated at eval Goal status: MET   2.  Pt will report 25% improvement in L shoulder symptoms with functional activities at work Baseline: 100% present 7/26: 25% improvement Goal status: MET     LONG TERM GOALS: Target date:  09/06/22   Pt will be able to wipe down the scales with a circular motion at work without pain Baseline: 8/10; 07/26/22: 6/10 Goal status: ONGOING   2.  Pt will be  able to perform left lateral cervical sidebend at work without pain Baseline: 10/10; 07/26/22: 5/10 Goal status: ONGOING   3.  Pt will be able to mop/sweep without pain Baseline: 6/10; 07/26/22: 7-8/10 Goal status: ONGOING   4.  Pt will have improved FOTO score to >/=57% function Baseline: 40% function 07/03/2022: 32% 08/16/2022: 44% Goal status: ONGOING   5.  Pt will  demo improvement in L shoulder strength to >/= 4+/5 to assist with work duties Baseline: 3+/5 throughout except mid trap/teres 2-/5 07/03/2022: 4+/5 Goal status: ACHIEVED     PLAN: PT FREQUENCY: 2x/week   PT DURATION: other: 6 weeks   PLANNED INTERVENTIONS: Therapeutic exercises, Therapeutic activity, Neuromuscular re-education, Patient/Family education, Joint mobilization, Dry Needling, Moist heat, Taping, Ultrasound, Manual therapy, and Re-evaluation   PLAN FOR NEXT SESSION:  L clavicular/scapular mobs, manual therapy for L Upper Trap/Teres/Mid trap tightness; strengthening L shoulder, dry needling   Vanessa [place], PT, DPT 08/29/22 10:45 AM

## 2022-09-04 NOTE — Telephone Encounter (Signed)
Please advise 

## 2022-09-05 ENCOUNTER — Other Ambulatory Visit (HOSPITAL_COMMUNITY): Payer: Self-pay

## 2022-09-05 ENCOUNTER — Encounter: Payer: Self-pay | Admitting: Physical Therapy

## 2022-09-05 ENCOUNTER — Ambulatory Visit: Payer: 59 | Admitting: Physical Therapy

## 2022-09-05 ENCOUNTER — Other Ambulatory Visit: Payer: Self-pay | Admitting: Allergy & Immunology

## 2022-09-05 DIAGNOSIS — R293 Abnormal posture: Secondary | ICD-10-CM

## 2022-09-05 DIAGNOSIS — M542 Cervicalgia: Secondary | ICD-10-CM | POA: Diagnosis not present

## 2022-09-05 NOTE — Therapy (Signed)
OUTPATIENT PHYSICAL THERAPY TREATMENT NOTE   Patient Name: Donna Park MRN: [id number] DOB:19-Jul-1971, 51 y.o., female Today's Date: 09/05/2022  PCP: Dorothyann Peng REFERRING PROVIDER: [name]   PT End of Session - 09/05/22 1005     Visit Number 15    Date for PT Re-Evaluation 09/26/22    Authorization Type UMR    Progress Note Due on Visit 10    PT Start Time 1003    PT Stop Time 8341    PT Time Calculation (min) 40 min    Activity Tolerance Patient tolerated treatment well    Behavior During Therapy WFL for tasks assessed/performed                     Past Medical History:  Diagnosis Date   Allergy    Anxiety    Arthritis    Asthma    Chicken pox    Complication of anesthesia    slow to wake up from anesthesia   Depression    DJD (degenerative joint disease)    GERD (gastroesophageal reflux disease)    resolved after gastric surgery   H/O gastric bypass 2016   History of iron deficiency anemia    HSV infection    Migraines    Obese    Pneumonia    Childhood history   PONV (postoperative nausea and vomiting)    Status post dilation of esophageal narrowing    Varicose vein of leg    Past Surgical History:  Procedure Laterality Date   ANKLE FRACTURE SURGERY Left 03/08/2009   BREAST SURGERY Bilateral    Cyst Removal   GASTRIC BYPASS  06/18/2015   LAPAROSCOPIC VAGINAL HYSTERECTOMY WITH SALPINGECTOMY Bilateral 05/15/2020   Procedure: LAPAROSCOPIC ASSISTED VAGINAL HYSTERECTOMY WITH SALPINGECTOMY;  Surgeon: Louretta Shorten, MD;  Location: [facility];  Service: Gynecology;  Laterality: Bilateral;  need bed   LASER ABLATION     Varicose veins   TONSILLECTOMY AND ADENOIDECTOMY  2001   TUBAL LIGATION  12/19/1999   UPPER GASTROINTESTINAL ENDOSCOPY     UPPER GI ENDOSCOPY     Uterine ablation  12/2018   Patient Active Problem List   Diagnosis Date Noted   LUQ pain 03/01/2022   Change in bowel habits 03/01/2022   Loss of weight  03/01/2022   Chronic migraine without aura without status migrainosus, not intractable 06/14/2021   S/P laparoscopic assisted vaginal hysterectomy (LAVH) 05/15/2020   Migraines    GERD (gastroesophageal reflux disease)    Asthma    Seasonal and perennial allergic rhinitis 02/10/2019   Moderate persistent asthma, uncomplicated 96/22/2979   Anaphylactic shock due to adverse food reaction 02/10/2019   Atopic dermatitis 02/10/2019   History of Roux-en-Y gastric bypass 2016    THERAPY DIAG:  Cervicalgia - Plan: PT plan of care cert/re-cert  Abnormal posture - Plan: PT plan of care cert/re-cert  REFERRING DIAG:  M54.2 (ICD-10-CM) - Neck pain  M99.01 (ICD-10-CM) - Somatic dysfunction of cervical region  M99.02 (ICD-10-CM) - Somatic dysfunction of thoracic region  M99.08 (ICD-10-CM) - Somatic dysfunction of rib region  M25.512 (ICD-10-CM) - Acute pain of left shoulder    PERTINENT HISTORY: repeat trigger point injections and has received OMT treatments with last being 05/23/22 by Glennon Mac, DO.  PRECAUTIONS/RESTRICTIONS:   No  SUBJECTIVE: Pt reports that her pain is slightly improved, but she is still having quite a bit of problems with shoulders feeling tight.  Pain: 6/10 L > R shoulder, parascapular, neck  and low back Aggs: shoulder abd/ext combo Eases: rest   OBJECTIVE: PATIENT SURVEYS:  FOTO 40% function 07/03/2022: 32% function 08/16/2022: 44%     COGNITION: Overall cognitive status: Within functional limits for tasks assessed     SENSATION: WFL   POSTURE:  sits with left lateral trunk lean, R shoulder heigher, R scapula lower, rounded shoulders bil, slight forward head, increased lumbar lordosis, L clavicle higher, L clavicular notch heigher, audible pop under L acromion    PALPATION: T2, T4-5, T7-8 hypomobile, pt with TTP along L Upper Trap,mid Trap, Teres; see posture section, coracoid process/acromion heigher left   CERVICAL ROM:    Active ROM A/PROM  (deg) eval  Flexion WFL  Extension WFL  Right lateral flexion WFL  Left lateral flexion WFL  Right rotation WFL  Left rotation WFL   (Blank rows = not tested)   UPPER EXTREMITY MMT:   MMT Right eval Left eval Left 07/03/2022 07/26/22  Shoulder flexion 4+/5 3+/5 with pain  4/5, p!  Shoulder extension       Shoulder abduction 4+/5 3+/5 4+/5  Shoulder adduction 4+/5 3+/5 4+/5, p!  Shoulder extension       Shoulder internal rotation 4+/5 3+/5 5/5  Shoulder external rotation 4+/5 3+/5 4+/5  Elbow flexion       Elbow extension       Wrist flexion       Wrist extension       Wrist ulnar deviation       Wrist radial deviation       Wrist pronation       Wrist supination        (Blank rows = not tested) mid-Trap,Teres 2-/5   UPPER EXTREMITY AROM:   MMT Right eval Left eval  Shoulder flexion [facility] with pain  Shoulder extension      Shoulder abduction [facility] WFL  Shoulder adduction [facility]  Shoulder extension      Shoulder internal rotation [facility]  Shoulder external rotation WFL WFL                                                                                (Blank rows = not tested)   CERVICAL SPECIAL TESTS:  NT    TODAY'S TREATMENT:   [facility] Adult PT Treatment:                                                DATE: 09/05/2022 Therapeutic Exercise: Seated levator scap stretch x81mn BIL Standing low rows with 30# cable 2x10 Standing high rows with 30# cable 2x10 Seated lat pull-downs with 30# cable 2x10 OH press 6# - 3x10 Standing BIL shoulder scaption with 3# dumbbells and with chin tuck isometric into ball at wall 3x10  Manual Therapy: Skilled palpation to identify trigger points prior to TPDN Effleurage/ STM to all musculature following TPDN   Trigger Point Dry-Needling  Treatment instructions: Expect mild to moderate muscle soreness. S/S of pneumothorax if dry needled over a lung field, and to seek immediate medical attention should they occur.  Patient verbalized understanding of these instructions and education.  Patient Consent Given: Yes Education handout provided: No Muscles treated: BIL UT, BIL subscapularis, BIL C4-C5 cervical multifidi, BIL suboccipitals, lumbar paraspinals (estim) Electrical stimulation performed: YES Parameters: 5 min low frequency - milli amps - low intensity; 5 min - micro amps - high frequency high intensity. Treatment response/outcome: Improved muscle extensibility  [facility] Adult PT Treatment:                                                DATE: 08/29/2022 Therapeutic Exercise: Seated levator scap stretch x7mn BIL Seated low rows with 40# cable 2x10 Seated high rows with 40# cable 2x10 Seated lat pull-downs with 40# cable 2x10 Seated shoulder rolls with chin tuck 2x10 forward and backward Standing BIL shoulder scaption with 3# dumbbells and with chin tuck isometric into ball at wall 3x10 Manual Therapy: Skilled palpation to identify trigger points prior to TPDN Effleurage/ STM to all musculature following TPDN   Trigger Point Dry-Needling  Treatment instructions: Expect mild to moderate muscle soreness. S/S of pneumothorax if dry needled over a lung field, and to seek immediate medical attention should they occur. Patient verbalized understanding of these instructions and education.  Patient Consent Given: Yes Education handout provided: No Muscles treated: BIL UT, BIL subscapularis, BIL C4-C5 cervical multifidi, BIL suboccipitals, lumbar paraspinals (estim) Electrical stimulation performed: YES Parameters: 5 min low frequency - milli amps - low intensity; 5 min - micro amps - high frequency high intensity. Treatment response/outcome: Improved muscle extensibility Neuromuscular re-ed: N/A Therapeutic Activity: N/A Modalities: N/A Self Care: N/A   [facility] Adult PT Treatment:                                                DATE: 08/23/2022 Therapeutic Exercise: Seated low rows with 35# cable  2x10 Seated high rows with 35# cable 2x10 Seated lat pull-downs with 35# cable 2x10 Seated shoulder rolls with chin tuck 2x10 forward and backward Standing BIL shoulder scaption with 4# dumbbells 3x10 Knee planks x3 to failure Manual Therapy: N/A Neuromuscular re-ed: N/A Therapeutic Activity: N/A Modalities: N/A Self Care: N/A   [facility] Adult PT Treatment:                                                DATE: 08/16/2022 Therapeutic Exercise: Sidelying lumbar open book x10 BIL Supine shoulder extension isometric into table with chin tuck 2x10 Supine 90/90 abdominal isometric with handhold resistance 3x30 seconds Manual Therapy: Skilled palpation to identify trigger points prior to TPDN Effleurage/ STM to all musculature following TPDN   Trigger Point Dry-Needling  Treatment instructions: Expect mild to moderate muscle soreness. S/S of pneumothorax if dry needled over a lung field, and to seek immediate medical attention should they occur. Patient verbalized understanding of these instructions and education.  Patient Consent Given: Yes Education handout provided: No Muscles treated: BIL UT, BIL subscapularis, BIL C4-C5 cervical multifidi, BIL suboccipitals, lumbar paraspinals (estim) Electrical stimulation performed: YES Parameters: 5 min low frequency - milli amps - low intensity; 5 min - micro amps - high frequency  high intensity. Treatment response/outcome: Improved muscle extensibility Neuromuscular re-ed: N/A Therapeutic Activity: N/A Modalities: N/A Self Care: N/A      ASSESSMENT: CLINICAL IMPRESSION: Donna Park continues to get significant pain relief with manual therapy combined with TDN.  Her shoulder strength is slowly improving, but does continue to show a significant strength deficit and low muscle bulk in her periscapular muscles.  We will extend POC for an additional 3 weeks, with planned DC home with HEP following this POC.    OBJECTIVE IMPAIRMENTS decreased  activity tolerance, decreased coordination, decreased mobility, decreased ROM, decreased strength, hypomobility, increased muscle spasms, impaired flexibility, impaired UE functional use, postural dysfunction, and pain.    ACTIVITY LIMITATIONS carrying, reach over head, and locomotion level   PARTICIPATION LIMITATIONS: meal prep, community activity, and occupation   [place] are also affecting patient's functional outcome.      GOALS: Goals reviewed with patient? Yes   SHORT TERM GOALS: Target date: 07/05/2022    Pt will be indep with initial HEP to assist with pain management Baseline: initiated at eval Goal status: MET   2.  Pt will report 25% improvement in L shoulder symptoms with functional activities at work Baseline: 100% present 7/26: 25% improvement Goal status: MET     LONG TERM GOALS: Target date:  09/06/22 (extended to 10/20)   Pt will be able to wipe down the scales with a circular motion at work without pain Baseline: 8/10; 07/26/22: 6/10 Goal status: ONGOING   2.  Pt will be able to perform left lateral cervical sidebend at work without pain Baseline: 10/10; 07/26/22: 5/10 Goal status: ONGOING   3.  Pt will be able to mop/sweep without pain Baseline: 6/10; 07/26/22: 7-8/10 Goal status: ONGOING   4.  Pt will have improved FOTO score to >/=57% function Baseline: 40% function 07/03/2022: 32% 08/16/2022: 44% Goal status: ONGOING   5.  Pt will demo improvement in L shoulder strength to >/= 4+/5 to assist with work duties Baseline: 3+/5 throughout except mid trap/teres 2-/5 07/03/2022: 4+/5 Goal status: ACHIEVED     PLAN: PT FREQUENCY: 2x/week   PT DURATION: other: 6 weeks (extended to 10/20)   PLANNED INTERVENTIONS: Therapeutic exercises, Therapeutic activity, Neuromuscular re-education, Patient/Family education, Joint mobilization, Dry Needling, Moist heat, Taping, Ultrasound, Manual therapy, and Re-evaluation   PLAN FOR NEXT SESSION:  L  clavicular/scapular mobs, manual therapy for L Upper Trap/Teres/Mid trap tightness; strengthening L shoulder, dry needling   Kevan Ny Akylah Hascall PT 09/05/22 11:53 AM

## 2022-09-05 NOTE — Telephone Encounter (Signed)
Please advise to refill

## 2022-09-08 ENCOUNTER — Other Ambulatory Visit (HOSPITAL_COMMUNITY): Payer: Self-pay

## 2022-09-12 ENCOUNTER — Other Ambulatory Visit (HOSPITAL_COMMUNITY): Payer: Self-pay

## 2022-09-15 ENCOUNTER — Other Ambulatory Visit (HOSPITAL_COMMUNITY): Payer: Self-pay

## 2022-09-16 ENCOUNTER — Other Ambulatory Visit (HOSPITAL_COMMUNITY): Payer: Self-pay

## 2022-09-16 ENCOUNTER — Other Ambulatory Visit: Payer: Self-pay | Admitting: Adult Health

## 2022-09-16 MED ORDER — VALACYCLOVIR HCL 500 MG PO TABS
1000.0000 mg | ORAL_TABLET | Freq: Two times a day (BID) | ORAL | 1 refills | Status: DC
  Filled 2022-09-16 – 2022-09-26 (×2): qty 120, 30d supply, fill #0
  Filled 2022-10-20: qty 120, 30d supply, fill #1

## 2022-09-17 ENCOUNTER — Other Ambulatory Visit (HOSPITAL_COMMUNITY): Payer: Self-pay

## 2022-09-17 MED ORDER — FLUCONAZOLE 150 MG PO TABS
150.0000 mg | ORAL_TABLET | Freq: Every day | ORAL | 5 refills | Status: DC
  Filled 2022-09-17 (×2): qty 14, 14d supply, fill #0
  Filled 2022-10-20: qty 14, 14d supply, fill #1

## 2022-09-17 NOTE — Addendum Note (Signed)
Addended by: Valentina Shaggy on: 09/17/2022 12:02 PM   Modules accepted: Orders

## 2022-09-18 ENCOUNTER — Other Ambulatory Visit (HOSPITAL_COMMUNITY): Payer: Self-pay

## 2022-09-18 ENCOUNTER — Ambulatory Visit (INDEPENDENT_AMBULATORY_CARE_PROVIDER_SITE_OTHER): Payer: 59

## 2022-09-18 DIAGNOSIS — J4550 Severe persistent asthma, uncomplicated: Secondary | ICD-10-CM | POA: Diagnosis not present

## 2022-09-19 ENCOUNTER — Ambulatory Visit: Payer: 59 | Attending: Sports Medicine

## 2022-09-19 DIAGNOSIS — M542 Cervicalgia: Secondary | ICD-10-CM | POA: Insufficient documentation

## 2022-09-19 DIAGNOSIS — R293 Abnormal posture: Secondary | ICD-10-CM | POA: Diagnosis not present

## 2022-09-19 NOTE — Therapy (Signed)
OUTPATIENT PHYSICAL THERAPY TREATMENT NOTE   Patient Name: Donna Park MRN: [id number] DOB:1971/10/29, 51 y.o., female Today's Date: 09/19/2022  PCP: Dorothyann Peng REFERRING PROVIDER: [name]   PT End of Session - 09/19/22 1052     Visit Number 16    Date for PT Re-Evaluation 09/26/22    Authorization Type UMR    Progress Note Due on Visit 10    PT Start Time 1052    PT Stop Time 1130    PT Time Calculation (min) 38 min    Activity Tolerance Patient tolerated treatment well    Behavior During Therapy WFL for tasks assessed/performed                      Past Medical History:  Diagnosis Date   Allergy    Anxiety    Arthritis    Asthma    Chicken pox    Complication of anesthesia    slow to wake up from anesthesia   Depression    DJD (degenerative joint disease)    GERD (gastroesophageal reflux disease)    resolved after gastric surgery   H/O gastric bypass 2016   History of iron deficiency anemia    HSV infection    Migraines    Obese    Pneumonia    Childhood history   PONV (postoperative nausea and vomiting)    Status post dilation of esophageal narrowing    Varicose vein of leg    Past Surgical History:  Procedure Laterality Date   ANKLE FRACTURE SURGERY Left 03/08/2009   BREAST SURGERY Bilateral    Cyst Removal   GASTRIC BYPASS  06/18/2015   LAPAROSCOPIC VAGINAL HYSTERECTOMY WITH SALPINGECTOMY Bilateral 05/15/2020   Procedure: LAPAROSCOPIC ASSISTED VAGINAL HYSTERECTOMY WITH SALPINGECTOMY;  Surgeon: Louretta Shorten, MD;  Location: [place];  Service: Gynecology;  Laterality: Bilateral;  need bed   LASER ABLATION     Varicose veins   TONSILLECTOMY AND ADENOIDECTOMY  2001   TUBAL LIGATION  12/19/1999   UPPER GASTROINTESTINAL ENDOSCOPY     UPPER GI ENDOSCOPY     Uterine ablation  12/2018   Patient Active Problem List   Diagnosis Date Noted   LUQ pain 03/01/2022   Change in bowel habits 03/01/2022   Loss of weight  03/01/2022   Chronic migraine without aura without status migrainosus, not intractable 06/14/2021   S/P laparoscopic assisted vaginal hysterectomy (LAVH) 05/15/2020   Migraines    GERD (gastroesophageal reflux disease)    Asthma    Seasonal and perennial allergic rhinitis 02/10/2019   Moderate persistent asthma, uncomplicated 20/94/7096   Anaphylactic shock due to adverse food reaction 02/10/2019   Atopic dermatitis 02/10/2019   History of Roux-en-Y gastric bypass 2016    THERAPY DIAG:  Cervicalgia  Abnormal posture  REFERRING DIAG:  M54.2 (ICD-10-CM) - Neck pain  M99.01 (ICD-10-CM) - Somatic dysfunction of cervical region  M99.02 (ICD-10-CM) - Somatic dysfunction of thoracic region  M99.08 (ICD-10-CM) - Somatic dysfunction of rib region  M25.512 (ICD-10-CM) - Acute pain of left shoulder    PERTINENT HISTORY: repeat trigger point injections and has received OMT treatments with last being 05/23/22 by Glennon Mac, DO.  PRECAUTIONS/RESTRICTIONS:   No  SUBJECTIVE: Pt reports feeling sore and tight in her upper traps today. She reports adherence to her HEP.  Pain: 6/10 L > R shoulder, parascapular, neck and low back Aggs: shoulder abd/ext combo Eases: rest   OBJECTIVE: PATIENT SURVEYS:  FOTO 40% function  07/03/2022: 32% function 08/16/2022: 44%     COGNITION: Overall cognitive status: Within functional limits for tasks assessed     SENSATION: WFL   POSTURE:  sits with left lateral trunk lean, R shoulder heigher, R scapula lower, rounded shoulders bil, slight forward head, increased lumbar lordosis, L clavicle higher, L clavicular notch heigher, audible pop under L acromion    PALPATION: T2, T4-5, T7-8 hypomobile, pt with TTP along L Upper Trap,mid Trap, Teres; see posture section, coracoid process/acromion heigher left   CERVICAL ROM:    Active ROM A/PROM (deg) eval  Flexion WFL  Extension WFL  Right lateral flexion WFL  Left lateral flexion WFL  Right  rotation WFL  Left rotation WFL   (Blank rows = not tested)   UPPER EXTREMITY MMT:   MMT Right eval Left eval Left 07/03/2022 07/26/22  Shoulder flexion 4+/5 3+/5 with pain  4/5, p!  Shoulder extension       Shoulder abduction 4+/5 3+/5 4+/5  Shoulder adduction 4+/5 3+/5 4+/5, p!  Shoulder extension       Shoulder internal rotation 4+/5 3+/5 5/5  Shoulder external rotation 4+/5 3+/5 4+/5  Elbow flexion       Elbow extension       Wrist flexion       Wrist extension       Wrist ulnar deviation       Wrist radial deviation       Wrist pronation       Wrist supination        (Blank rows = not tested) mid-Trap,Teres 2-/5   UPPER EXTREMITY AROM:   MMT Right eval Left eval  Shoulder flexion [facility] with pain  Shoulder extension      Shoulder abduction [facility] WFL  Shoulder adduction [facility]  Shoulder extension      Shoulder internal rotation [facility]  Shoulder external rotation WFL WFL                                                                                (Blank rows = not tested)   CERVICAL SPECIAL TESTS:  NT    TODAY'S TREATMENT:  [facility] Adult PT Treatment:                                                DATE: 09/19/2022 Therapeutic Exercise: Seated low rows with 35# with chin tuck hold 2x10 Seated high rows with 35# chin tuck hold 2x10 Seated lat pull-downs with 35# chin tuck hold 2x10 Standing BIL shoulder scaption with 4# dumbbells and with chin tuck into ball at wall 3x10 Manual Therapy: Prone effleurage/ STM to BIL mid trap/ rhomboids x10 minutes Supine effleurage to cervical paraspinals/ UT x5 minutes Supine manual cervical distraction x3 minutes Neuromuscular re-ed: N/A Therapeutic Activity: N/A Modalities: N/A Self Care: N/A   [facility] Adult PT Treatment:  DATE: 09/05/2022 Therapeutic Exercise: Seated levator scap stretch x71mn BIL Standing low rows with 30# cable 2x10 Standing high rows with  30# cable 2x10 Seated lat pull-downs with 30# cable 2x10 OH press 6# - 3x10 Standing BIL shoulder scaption with 3# dumbbells and with chin tuck isometric into ball at wall 3x10  Manual Therapy: Skilled palpation to identify trigger points prior to TPDN Effleurage/ STM to all musculature following TPDN   Trigger Point Dry-Needling  Treatment instructions: Expect mild to moderate muscle soreness. S/S of pneumothorax if dry needled over a lung field, and to seek immediate medical attention should they occur. Patient verbalized understanding of these instructions and education.  Patient Consent Given: Yes Education handout provided: No Muscles treated: BIL UT, BIL subscapularis, BIL C4-C5 cervical multifidi, BIL suboccipitals, lumbar paraspinals (estim) Electrical stimulation performed: YES Parameters: 5 min low frequency - milli amps - low intensity; 5 min - micro amps - high frequency high intensity. Treatment response/outcome: Improved muscle extensibility  [facility] Adult PT Treatment:                                                DATE: 08/29/2022 Therapeutic Exercise: Seated levator scap stretch x124m BIL Seated low rows with 40# cable 2x10 Seated high rows with 40# cable 2x10 Seated lat pull-downs with 40# cable 2x10 Seated shoulder rolls with chin tuck 2x10 forward and backward Standing BIL shoulder scaption with 3# dumbbells and with chin tuck isometric into ball at wall 3x10 Manual Therapy: Skilled palpation to identify trigger points prior to TPDN Effleurage/ STM to all musculature following TPDN   Trigger Point Dry-Needling  Treatment instructions: Expect mild to moderate muscle soreness. S/S of pneumothorax if dry needled over a lung field, and to seek immediate medical attention should they occur. Patient verbalized understanding of these instructions and education.  Patient Consent Given: Yes Education handout provided: No Muscles treated: BIL UT, BIL subscapularis, BIL C4-C5  cervical multifidi, BIL suboccipitals, lumbar paraspinals (estim) Electrical stimulation performed: YES Parameters: 5 min low frequency - milli amps - low intensity; 5 min - micro amps - high frequency high intensity. Treatment response/outcome: Improved muscle extensibility Neuromuscular re-ed: N/A Therapeutic Activity: N/A Modalities: N/A Self Care: N/A      ASSESSMENT: CLINICAL IMPRESSION: Pt reports continued upper trap/ neck pain with prolonged sitting and with neck movements. However, she responded well to all interventions today, demonstrating good form and no increase in pain throughout the session. She reports a therapeutic response to manual techniques and will continue to benefit from skilled PT to address her primary impairments and return to her prior level of function with less limitation.    OBJECTIVE IMPAIRMENTS decreased activity tolerance, decreased coordination, decreased mobility, decreased ROM, decreased strength, hypomobility, increased muscle spasms, impaired flexibility, impaired UE functional use, postural dysfunction, and pain.    ACTIVITY LIMITATIONS carrying, reach over head, and locomotion level   PARTICIPATION LIMITATIONS: meal prep, community activity, and occupation   [place] patient's functional outcome.      GOALS: Goals reviewed with patient? Yes   SHORT TERM GOALS: Target date: 07/05/2022    Pt will be indep with initial HEP to assist with pain management Baseline: initiated at eval Goal status: MET   2.  Pt will report 25% improvement in L shoulder symptoms with functional activities at work Baseline: 100%  present 7/26: 25% improvement Goal status: MET     LONG TERM GOALS: Target date:  09/06/22 (extended to 10/20)   Pt will be able to wipe down the scales with a circular motion at work without pain Baseline: 8/10; 07/26/22: 6/10 Goal status: ONGOING   2.  Pt will be able to perform left lateral  cervical sidebend at work without pain Baseline: 10/10; 07/26/22: 5/10 09/19/2022: 8/10 Goal status: ONGOING   3.  Pt will be able to mop/sweep without pain Baseline: 6/10; 07/26/22: 7-8/10 09/19/2022: 8/10 Goal status: ONGOING   4.  Pt will have improved FOTO score to >/=57% function Baseline: 40% function 07/03/2022: 32% 08/16/2022: 44% Goal status: ONGOING   5.  Pt will demo improvement in L shoulder strength to >/= 4+/5 to assist with work duties Baseline: 3+/5 throughout except mid trap/teres 2-/5 07/03/2022: 4+/5 Goal status: ACHIEVED     PLAN: PT FREQUENCY: 2x/week   PT DURATION: other: 6 weeks (extended to 10/20)   PLANNED INTERVENTIONS: Therapeutic exercises, Therapeutic activity, Neuromuscular re-education, Patient/Family education, Joint mobilization, Dry Needling, Moist heat, Taping, Ultrasound, Manual therapy, and Re-evaluation   PLAN FOR NEXT SESSION:  L clavicular/scapular mobs, manual therapy for L Upper Trap/Teres/Mid trap tightness; strengthening L shoulder, dry needling   Vanessa Centuria, PT, DPT 09/19/22 11:37 AM

## 2022-09-24 ENCOUNTER — Ambulatory Visit (HOSPITAL_COMMUNITY)
Admission: RE | Admit: 2022-09-24 | Discharge: 2022-09-24 | Disposition: A | Discharge status: Home / Self Care | Payer: 59 | Attending: Gastroenterology | Admitting: Gastroenterology

## 2022-09-24 ENCOUNTER — Encounter (HOSPITAL_COMMUNITY)
Admission: RE | Disposition: A | Discharge status: Home / Self Care | Payer: Self-pay | Source: Home / Self Care | Attending: Gastroenterology

## 2022-09-24 DIAGNOSIS — R1310 Dysphagia, unspecified: Secondary | ICD-10-CM | POA: Diagnosis not present

## 2022-09-24 HISTORY — PX: ESOPHAGEAL MANOMETRY: SHX5429

## 2022-09-24 SURGERY — MANOMETRY, ESOPHAGUS

## 2022-09-24 MED ORDER — LIDOCAINE VISCOUS HCL 2 % MT SOLN
OROMUCOSAL | Status: AC
  Filled 2022-09-24: qty 15

## 2022-09-24 SURGICAL SUPPLY — 2 items
FACESHIELD LNG OPTICON STERILE (SAFETY) IMPLANT
GLOVE BIO SURGEON STRL SZ8 (GLOVE) ×2 IMPLANT

## 2022-09-24 NOTE — Progress Notes (Signed)
Esophageal manometry performed per protocol without complications.  Patient tolerated well. 

## 2022-09-25 ENCOUNTER — Other Ambulatory Visit (HOSPITAL_COMMUNITY): Payer: Self-pay

## 2022-09-25 NOTE — Progress Notes (Signed)
Donna Park D.Hardin [place] Phone: [phone]   Assessment and Plan:     1. Chronic left shoulder pain 2. Neck pain 3. Chronic bilateral low back pain with right-sided sciatica 4. Somatic dysfunction of thoracic region 5. Somatic dysfunction of lumbar region 6. Somatic dysfunction of cervical region 7. Somatic dysfunction of rib region 8. Somatic dysfunction of pelvic region -Chronic with exacerbation, subsequent visit - Recurrence of multiple musculoskeletal complaints with most prominent being in bilateral hips, neck, left shoulder - Patient is getting moderate improvement with PT, dry needling, regular OMT visits - Renewed PT for back and added left shoulder - Patient has received significant relief with OMT in the past.  Elects for repeat OMT today.  Tolerated well per note below. - Decision today to treat with OMT was based on Physical Exam  After verbal consent patient was treated with HVLA (high velocity low amplitude), ME (muscle energy), FPR (flex positional release), ST (soft tissue), PC/PD (Pelvic Compression/ Pelvic Decompression) techniques in cervical, rib, thoracic, lumbar, and pelvic areas. Patient tolerated the procedure well with improvement in symptoms.  Patient educated on potential side effects of soreness and recommended to rest, hydrate, and use Tylenol as needed for pain control.  9. Osteoporosis, unspecified osteoporosis type, unspecified pathological fracture presence  -Chronic - Patient with T score -2.9 on DEXA scan from 08/12/2022 indicating osteoporosis - Patient currently taking vitamin D 50,000 units weekly and calcium supplementation - We will recheck vitamin D and calcium levels today. - We will check kidney function in anticipation of potentially starting alendronate - Could consider starting alendronate medication to treat osteoporosis with patient turning 51 years old in  01/25/2023.  Patient does have history of esophageal narrowing with history of a dilation procedure.  She is currently seeing GI with symptoms of constipation.  I have reached out to GI, Dr. Bryan Lemma, to ask their opinion on starting alendronate medication. - If we decide to move forward with medication therapy, patient elected to start alendronate 70 mg weekly.  Pertinent previous records reviewed include DEXA scan 08/12/2022, procedure notes 09/24/2022   Follow Up: 4 weeks for reevaluation.  Could consider repeat OMT.  Would review lab work and further discuss osteoporosis treatment.   Subjective:   I, Donna Park, am serving as a Education administrator for Doctor Glennon Mac   Chief Complaint: left shoulder pain and tightness   HPI:  01/31/2022 Patient is a 51 year old female complaining of neck and shoulder pain. Patient states been going on for about 2 weeks has knots in her shoulders both neck and shoulders have been stiff , interested in OMT , knots in her upper trap , low back and her sciatic nerve on her right side flares up when she's having intercourse goes down her leg and upper her back    02/21/2022 Patient states that her neck and her shoulders are still bothering her after her adjustment she was very tight is still tight in the upper back , still wants the adjustment will get a massage later tonight as well  , hip still hurts , her tailbone she woke up one morning with pain     03/06/2022 Patient states still has upper trap tightness, finder her self leaning on that left side    03/28/2022 Patient states that she is still tight time for an adjustment would like to discuss a lower spot for CSI    04/25/2022 Patient states that  her neck is heavy, would like a referral for MRI, still neck tightness and pain,   05/23/2022 Patient states she is ready for the OMT but the left shoulder clavicle have been sore, neck and shoulder still hurts. Patient has had the MRI of the ankle this morning.  Patient wanting to know if she can get another shoulder injection today    06/20/2022 Patient states that she is a little better decreased ROM but is going to PT , ankle feels better bought different shoes    08/08/2022  Patient states shoulder still hurts has been dry needling and want to talk talk about lidocaine again and then low back pain want referral for PT and sciatic pain    08/29/2022 Patient states wants OMT, knot still hurts    09/26/2022 Patient states that her shoulder still hurts , last week her bones were hurting , body is achy and painful   Relevant Historical Information: History of migraines being treated with Botox injections  Additional pertinent review of systems negative.   Current Outpatient Medications:    acetaminophen (TYLENOL) 500 MG tablet, Take by mouth., Disp: , Rfl:    acyclovir ointment (ZOVIRAX) 5 %, Apply onto the affected area every 3 hours., Disp: 15 g, Rfl: 1   albuterol (VENTOLIN HFA) 108 (90 Base) MCG/ACT inhaler, Inhale 1 - 2 puffs into the lungs every 6 hours as needed for wheezing or shortness of breath., Disp: 20.1 g, Rfl: 0   amitriptyline (ELAVIL) 10 MG tablet, Take 1 tablet (10 mg total) by mouth at bedtime., Disp: 90 tablet, Rfl: 1   azelastine (ASTELIN) 0.1 % nasal spray, Place 2 sprays into both nostrils 2 (two) times daily., Disp: 30 mL, Rfl: 5   Azelastine-Fluticasone (DYMISTA) 137-50 MCG/ACT SUSP, Place 2 sprays into both nostrils 2 (two) times daily as needed., Disp: 23 g, Rfl: 5   CALCIUM PO, Take 1 tablet by mouth 4 (four) times a week., Disp: , Rfl:    cetirizine (ZYRTEC) 10 MG tablet, Take 2 tablets (20 mg total) by mouth 2 (two) times daily., Disp: 360 tablet, Rfl: 1   Cyanocobalamin (NASCOBAL NA), Place 1 spray into the nose every Sunday., Disp: , Rfl:    diazepam (VALIUM) 10 MG tablet, Take 1 tablet (10 mg total) by mouth at bedtime as needed for sleep., Disp: 30 tablet, Rfl: 2   EPINEPHrine 0.3 mg/0.3 mL IJ SOAJ injection, Inject  0.3 mg into the muscle as needed for anaphylaxis., Disp: 2 each, Rfl: 1   estradiol (ESTRACE) 2 MG tablet, Take 1 tablet (2 mg total) by mouth daily., Disp: 90 tablet, Rfl: 12   estradiol (ESTRACE) 2 MG tablet, Take 1 tablet (2 mg total) by mouth daily., Disp: 30 tablet, Rfl: 12   fluconazole (DIFLUCAN) 150 MG tablet, Take 1 tablet (150 mg total) by mouth daily., Disp: 14 tablet, Rfl: 5   fluticasone (FLONASE) 50 MCG/ACT nasal spray, Place 2 sprays into both nostrils daily., Disp: 16 g, Rfl: 5   Fluticasone-Umeclidin-Vilant (TRELEGY ELLIPTA) 200-62.5-25 MCG/ACT AEPB, Inhale 1 puff into the lungs daily., Disp: 60 each, Rfl: 5   Galcanezumab-gnlm (EMGALITY) 120 MG/ML SOAJ, Inject 120 mg into the skin every 30 (thirty) days., Disp: 3 mL, Rfl: 0   Hypertonic Nasal Wash (SINUS RINSE REFILL) PACK, Use one packet dissolved in water as needed. (Patient taking differently: Place 1 each into the nose in the morning and at bedtime.), Disp: 100 each, Rfl: 5   ibuprofen (ADVIL) 800 MG tablet, Take  1 tablet (800 mg total) by mouth 3 (three) times daily as needed (pain.)., Disp: 270 tablet, Rfl: 0   ipratropium (ATROVENT) 0.06 % nasal spray, Place 2 sprays into both nostrils 4 (four) times daily., Disp: 45 mL, Rfl: 3   linaclotide (LINZESS) 145 MCG CAPS capsule, Take 1 capsule (145 mcg total) by mouth daily before breakfast., Disp: 60 capsule, Rfl: 3   linaclotide (LINZESS) 290 MCG CAPS capsule, Take 1 capsule (290 mcg total) by mouth daily before breakfast., Disp: 90 capsule, Rfl: 1   meclizine (ANTIVERT) 25 MG tablet, Take 25 mg by mouth 3 (three) times daily as needed (dizziness/vertigo). , Disp: , Rfl:    meloxicam (MOBIC) 15 MG tablet, Take 1 tablet (15 mg total) by mouth daily., Disp: 30 tablet, Rfl: 0   MUCUS RELIEF ER 600 MG 12 hr tablet, Take 600-1,200 mg by mouth 2 (two) times daily as needed (congestion). , Disp: , Rfl:    ondansetron (ZOFRAN-ODT) 4 MG disintegrating tablet, Take 1-2 tablets (4-8 mg  total) by mouth every 8 (eight) hours as needed. May take with Rizatriptan, Disp: 30 tablet, Rfl: 3   pantoprazole (PROTONIX) 40 MG tablet, Take 1 tablet (40 mg total) by mouth at bedtime., Disp: 90 tablet, Rfl: 3   Pediatric Multiple Vit-C-FA (MULTIVITAMIN ANIMAL SHAPES, WITH CA/FA,) with C & FA chewable tablet, Chew 2 tablets by mouth daily., Disp: , Rfl:    Rimegepant Sulfate (NURTEC) 75 MG TBDP, Dissolve 1 tablet by mouth daily as needed for migraines. Take as close to onset of migraine as possible. One daily maximum., Disp: 16 tablet, Rfl: 11   rizatriptan (MAXALT-MLT) 10 MG disintegrating tablet, Dissolve 1 tablet (10 mg total) by mouth as needed for migraine. May repeat in 2 hours if needed, Disp: 27 tablet, Rfl: 4   simethicone (MYLICON) 80 MG chewable tablet, Chew 80 mg by mouth every 6 (six) hours as needed for flatulence., Disp: , Rfl:    tezepelumab-ekko (TEZSPIRE) 210 MG/1.91ML syringe, Inject 1.91 mLs (210 mg total) into the skin every 28 (twenty-eight) days., Disp: 1.91 mL, Rfl: 11   tiZANidine (ZANAFLEX) 4 MG tablet, Take 1 tablet by mouth every 8  hours as needed., Disp: 10 tablet, Rfl: 0   triamcinolone ointment (KENALOG) 0.1 %, APPLY TO AFFECTED AREA(S) 2 TIMES A DAY, Disp: 454 g, Rfl: 2   valACYclovir (VALTREX) 500 MG tablet, Take 500 mg by mouth 2 (two) times daily., Disp: , Rfl:    valACYclovir (VALTREX) 500 MG tablet, Take 2 tablets (1,000 mg total) by mouth 2 (two) times daily as needed for acute flares, Disp: 120 tablet, Rfl: 1   Vibegron (GEMTESA) 75 MG TABS, Take 1 tablet by mouth daily., Disp: 30 tablet, Rfl: 11   Vibegron (GEMTESA) 75 MG TABS, Take 1 tablet by mouth daily., Disp: 90 tablet, Rfl: 4   Vitamin D, Ergocalciferol, (DRISDOL) 1.25 MG (50000 UNIT) CAPS capsule, Take 1 capsule by mouth every 7 days., Disp: 12 capsule, Rfl: 3   fluticasone-salmeterol (ADVAIR HFA) 230-21 MCG/ACT inhaler, INHALE 2 PUFFS INTO THE LUNGS 2 (TWO) TIMES DAILY., Disp: 36 g, Rfl: 1  Current  Facility-Administered Medications:    tezepelumab-ekko (TEZSPIRE) 210 MG/1.91ML syringe 210 mg, 210 mg, Subcutaneous, Q28 days, Valentina Shaggy, MD, 210 mg at 09/18/22 1752   Objective:     Vitals:   09/26/22 0902  BP: 108/78  Pulse: 90  SpO2: 100%  Weight: 169 lb (76.7 kg)  Height: '5\' 6"'$  (1.676 m)  Body mass index is 27.28 kg/m.    Physical Exam:    General: Well-appearing, cooperative, sitting comfortably in no acute distress.   OMT Physical Exam:  ASIS Compression Test: Positive Right Cervical: TTP paraspinal, C3 RLSR, C5-7 RLSL Rib: Right elevated first rib with TTP Thoracic: TTP paraspinal, T4-7 RRSL Lumbar: TTP paraspinal, L1-3 RRSL Pelvis: Right anterior innominate with out flare   Electronically signed by:  Donna Park D.[facility] 10:02 AM 09/26/22

## 2022-09-26 ENCOUNTER — Ambulatory Visit: Payer: 59 | Admitting: Physical Therapy

## 2022-09-26 ENCOUNTER — Other Ambulatory Visit: Payer: Self-pay | Admitting: Neurology

## 2022-09-26 ENCOUNTER — Telehealth: Payer: Self-pay | Admitting: Allergy & Immunology

## 2022-09-26 ENCOUNTER — Other Ambulatory Visit: Payer: Self-pay | Admitting: Adult Health

## 2022-09-26 ENCOUNTER — Other Ambulatory Visit (HOSPITAL_COMMUNITY): Payer: Self-pay

## 2022-09-26 ENCOUNTER — Other Ambulatory Visit: Payer: Self-pay | Admitting: Allergy & Immunology

## 2022-09-26 ENCOUNTER — Encounter: Payer: Self-pay | Admitting: Physical Therapy

## 2022-09-26 ENCOUNTER — Ambulatory Visit (INDEPENDENT_AMBULATORY_CARE_PROVIDER_SITE_OTHER): Payer: 59 | Admitting: Sports Medicine

## 2022-09-26 VITALS — BP 108/78 | HR 90 | Ht 66.0 in | Wt 169.0 lb

## 2022-09-26 DIAGNOSIS — M9901 Segmental and somatic dysfunction of cervical region: Secondary | ICD-10-CM | POA: Diagnosis not present

## 2022-09-26 DIAGNOSIS — M810 Age-related osteoporosis without current pathological fracture: Secondary | ICD-10-CM

## 2022-09-26 DIAGNOSIS — M9902 Segmental and somatic dysfunction of thoracic region: Secondary | ICD-10-CM

## 2022-09-26 DIAGNOSIS — G8929 Other chronic pain: Secondary | ICD-10-CM

## 2022-09-26 DIAGNOSIS — M25512 Pain in left shoulder: Secondary | ICD-10-CM | POA: Diagnosis not present

## 2022-09-26 DIAGNOSIS — M9903 Segmental and somatic dysfunction of lumbar region: Secondary | ICD-10-CM | POA: Diagnosis not present

## 2022-09-26 DIAGNOSIS — M542 Cervicalgia: Secondary | ICD-10-CM

## 2022-09-26 DIAGNOSIS — J4541 Moderate persistent asthma with (acute) exacerbation: Secondary | ICD-10-CM

## 2022-09-26 DIAGNOSIS — M9905 Segmental and somatic dysfunction of pelvic region: Secondary | ICD-10-CM | POA: Diagnosis not present

## 2022-09-26 DIAGNOSIS — M5441 Lumbago with sciatica, right side: Secondary | ICD-10-CM | POA: Diagnosis not present

## 2022-09-26 DIAGNOSIS — M9908 Segmental and somatic dysfunction of rib cage: Secondary | ICD-10-CM

## 2022-09-26 DIAGNOSIS — R293 Abnormal posture: Secondary | ICD-10-CM | POA: Diagnosis not present

## 2022-09-26 LAB — VITAMIN D 25 HYDROXY (VIT D DEFICIENCY, FRACTURES): VITD: 33.58 ng/mL (ref 30.00–100.00)

## 2022-09-26 LAB — BASIC METABOLIC PANEL
BUN: 13 mg/dL (ref 6–23)
CO2: 27 mEq/L (ref 19–32)
Calcium: 8.6 mg/dL (ref 8.4–10.5)
Chloride: 105 mEq/L (ref 96–112)
Creatinine, Ser: 0.71 mg/dL (ref 0.40–1.20)
GFR: 99.67 mL/min (ref >60.00)
Glucose, Bld: 92 mg/dL (ref 70–99)
Potassium: 3.9 mEq/L (ref 3.5–5.1)
Sodium: 138 mEq/L (ref 135–145)

## 2022-09-26 MED ORDER — TRELEGY ELLIPTA 200-62.5-25 MCG/ACT IN AEPB
1.0000 | INHALATION_SPRAY | Freq: Every day | RESPIRATORY_TRACT | 5 refills | Status: DC
  Filled 2022-09-26: qty 60, 30d supply, fill #0
  Filled 2022-11-26: qty 60, 30d supply, fill #1
  Filled 2023-06-25: qty 60, 30d supply, fill #2

## 2022-09-26 MED ORDER — DIAZEPAM 10 MG PO TABS
10.0000 mg | ORAL_TABLET | Freq: Every evening | ORAL | 2 refills | Status: DC | PRN
  Filled 2022-09-26: qty 30, 30d supply, fill #0
  Filled 2022-11-26: qty 30, 30d supply, fill #1

## 2022-09-26 MED ORDER — ALBUTEROL SULFATE HFA 108 (90 BASE) MCG/ACT IN AERS
1.0000 | INHALATION_SPRAY | Freq: Four times a day (QID) | RESPIRATORY_TRACT | 0 refills | Status: DC | PRN
  Filled 2022-09-26: qty 20.1, 75d supply, fill #0

## 2022-09-26 NOTE — Therapy (Unsigned)
OUTPATIENT PHYSICAL THERAPY TREATMENT NOTE   Patient Name: Donna Park MRN: [id number] DOB:June 13, 1971, 51 y.o., female Today's Date: 09/27/2022  PCP: Dorothyann Peng REFERRING PROVIDER: [name]   PT End of Session - 09/26/22 1131     Visit Number 17    Date for PT Re-Evaluation 10/24/22    Authorization Type [employer]    Progress Note Due on Visit 10    PT Start Time 1130    PT Stop Time 1211    PT Time Calculation (min) 41 min    Activity Tolerance Patient tolerated treatment well    Behavior During Therapy WFL for tasks assessed/performed                      Past Medical History:  Diagnosis Date   Allergy    Anxiety    Arthritis    Asthma    Chicken pox    Complication of anesthesia    slow to wake up from anesthesia   Depression    DJD (degenerative joint disease)    GERD (gastroesophageal reflux disease)    resolved after gastric surgery   H/O gastric bypass 2016   History of iron deficiency anemia    HSV infection    Migraines    Obese    Pneumonia    Childhood history   PONV (postoperative nausea and vomiting)    Status post dilation of esophageal narrowing    Varicose vein of leg    Past Surgical History:  Procedure Laterality Date   ANKLE FRACTURE SURGERY Left 03/08/2009   BREAST SURGERY Bilateral    Cyst Removal   GASTRIC BYPASS  06/18/2015   LAPAROSCOPIC VAGINAL HYSTERECTOMY WITH SALPINGECTOMY Bilateral 05/15/2020   Procedure: LAPAROSCOPIC ASSISTED VAGINAL HYSTERECTOMY WITH SALPINGECTOMY;  Surgeon: Louretta Shorten, MD;  Location: [facility];  Service: Gynecology;  Laterality: Bilateral;  need bed   LASER ABLATION     Varicose veins   TONSILLECTOMY AND ADENOIDECTOMY  2001   TUBAL LIGATION  12/19/1999   UPPER GASTROINTESTINAL ENDOSCOPY     UPPER GI ENDOSCOPY     Uterine ablation  12/2018   Patient Active Problem List   Diagnosis Date Noted   LUQ pain 03/01/2022   Change in bowel habits 03/01/2022   Loss of weight  03/01/2022   Chronic migraine without aura without status migrainosus, not intractable 06/14/2021   S/P laparoscopic assisted vaginal hysterectomy (LAVH) 05/15/2020   Migraines    GERD (gastroesophageal reflux disease)    Asthma    Seasonal and perennial allergic rhinitis 02/10/2019   Moderate persistent asthma, uncomplicated 25/95/6387   Anaphylactic shock due to adverse food reaction 02/10/2019   Atopic dermatitis 02/10/2019   History of Roux-en-Y gastric bypass 2016    THERAPY DIAG:  Cervicalgia - Plan: PT plan of care cert/re-cert  Abnormal posture - Plan: PT plan of care cert/re-cert  REFERRING DIAG:  M54.2 (ICD-10-CM) - Neck pain  M99.01 (ICD-10-CM) - Somatic dysfunction of cervical region  M99.02 (ICD-10-CM) - Somatic dysfunction of thoracic region  M99.08 (ICD-10-CM) - Somatic dysfunction of rib region  M25.512 (ICD-10-CM) - Acute pain of left shoulder    PERTINENT HISTORY: repeat trigger point injections and has received OMT treatments with last being 05/23/22 by Glennon Mac, DO.  PRECAUTIONS/RESTRICTIONS:   No  SUBJECTIVE:   Pt reports that she has had some improvement in her shoulder pain with physical therapy.  She states her MD would like her to continue PT.  Pain:  5-6/10 L > R shoulder, parascapular, neck and low back Aggs: shoulder abd/ext combo Eases: rest   OBJECTIVE: PATIENT SURVEYS:  FOTO 40% function 07/03/2022: 32% function 08/16/2022: 44%     COGNITION: Overall cognitive status: Within functional limits for tasks assessed     SENSATION: WFL   POSTURE:  sits with left lateral trunk lean, R shoulder heigher, R scapula lower, rounded shoulders bil, slight forward head, increased lumbar lordosis, L clavicle higher, L clavicular notch heigher, audible pop under L acromion    PALPATION: T2, T4-5, T7-8 hypomobile, pt with TTP along L Upper Trap,mid Trap, Teres; see posture section, coracoid process/acromion heigher left   CERVICAL ROM:     Active ROM A/PROM (deg) eval  Flexion WFL  Extension WFL  Right lateral flexion WFL  Left lateral flexion WFL  Right rotation WFL  Left rotation WFL   (Blank rows = not tested)   UPPER EXTREMITY MMT:   MMT Right eval Left eval Left 07/03/2022 07/26/22  Shoulder flexion 4+/5 3+/5 with pain  4/5, p!  Shoulder extension       Shoulder abduction 4+/5 3+/5 4+/5  Shoulder adduction 4+/5 3+/5 4+/5, p!  Shoulder extension       Shoulder internal rotation 4+/5 3+/5 5/5  Shoulder external rotation 4+/5 3+/5 4+/5  Elbow flexion       Elbow extension       Wrist flexion       Wrist extension       Wrist ulnar deviation       Wrist radial deviation       Wrist pronation       Wrist supination        (Blank rows = not tested) mid-Trap,Teres 2-/5   UPPER EXTREMITY AROM:   MMT Right eval Left eval  Shoulder flexion [facility] with pain  Shoulder extension      Shoulder abduction [facility] WFL  Shoulder adduction [facility]  Shoulder extension      Shoulder internal rotation [facility]  Shoulder external rotation WFL WFL                                                                                (Blank rows = not tested)   CERVICAL SPECIAL TESTS:  NT    TODAY'S TREATMENT:   [facility] Adult PT Treatment:                                                DATE: 09/26/2022 Therapeutic Exercise:  Standing low rows with 30# cable 2x10 Standing high rows with 30# cable 2x10 Seated lat pull-downs with 30# cable 2x10 90-90 walk 7# - 30'' x2 ea Standing BIL shoulder scaption with 3# dumbbells and with chin tuck isometric into ball at wall 3x10 TY 2x10  Manual Therapy: Skilled palpation to identify trigger points prior to TPDN Effleurage/ STM to all musculature following TPDN   Trigger Point Dry-Needling  Treatment instructions: Expect mild to moderate muscle soreness. S/S of pneumothorax if dry needled over a lung field, and to seek  immediate medical attention should they occur.  Patient verbalized understanding of these instructions and education.  Patient Consent Given: Yes Education handout provided: No Muscles treated: BIL UT, BIL subscapularis, BIL C4-C5 cervical multifidi, BIL suboccipitals, lumbar paraspinals (estim) Electrical stimulation performed: YES Parameters: 5 min low frequency - milli amps - low intensity; 5 min - micro amps - high frequency high intensity. Treatment response/outcome: Improved muscle extensibility  [facility] Adult PT Treatment:                                                DATE: 09/19/2022 Therapeutic Exercise: Seated low rows with 35# with chin tuck hold 2x10 Seated high rows with 35# chin tuck hold 2x10 Seated lat pull-downs with 35# chin tuck hold 2x10 Standing BIL shoulder scaption with 4# dumbbells and with chin tuck into ball at wall 3x10 Manual Therapy: Prone effleurage/ STM to BIL mid trap/ rhomboids x10 minutes Supine effleurage to cervical paraspinals/ UT x5 minutes Supine manual cervical distraction x3 minutes Neuromuscular re-ed: N/A Therapeutic Activity: N/A Modalities: N/A Self Care: N/A   [facility] Adult PT Treatment:                                                DATE: 09/05/2022 Therapeutic Exercise: Seated levator scap stretch x54mn BIL Standing low rows with 30# cable 2x10 Standing high rows with 30# cable 2x10 Seated lat pull-downs with 30# cable 2x10 OH press 6# - 3x10 Standing BIL shoulder scaption with 3# dumbbells and with chin tuck isometric into ball at wall 3x10  Manual Therapy: Skilled palpation to identify trigger points prior to TPDN Effleurage/ STM to all musculature following TPDN   Trigger Point Dry-Needling  Treatment instructions: Expect mild to moderate muscle soreness. S/S of pneumothorax if dry needled over a lung field, and to seek immediate medical attention should they occur. Patient verbalized understanding of these instructions and education.  Patient Consent Given: Yes Education  handout provided: No Muscles treated: BIL UT, BIL subscapularis, BIL C4-C5 cervical multifidi, BIL suboccipitals, lumbar paraspinals (estim) Electrical stimulation performed: YES Parameters: 5 min low frequency - milli amps - low intensity; 5 min - micro amps - high frequency high intensity. Treatment response/outcome: Improved muscle extensibility  [facility] Adult PT Treatment:                                                DATE: 08/29/2022 Therapeutic Exercise: Seated levator scap stretch x160m BIL Seated low rows with 40# cable 2x10 Seated high rows with 40# cable 2x10 Seated lat pull-downs with 40# cable 2x10 Seated shoulder rolls with chin tuck 2x10 forward and backward Standing BIL shoulder scaption with 3# dumbbells and with chin tuck isometric into ball at wall 3x10 Manual Therapy: Skilled palpation to identify trigger points prior to TPDN Effleurage/ STM to all musculature following TPDN   Trigger Point Dry-Needling  Treatment instructions: Expect mild to moderate muscle soreness. S/S of pneumothorax if dry needled over a lung field, and to seek immediate medical attention should they occur. Patient verbalized understanding of these instructions and education.  Patient Consent Given:  Yes Education handout provided: No Muscles treated: BIL UT, BIL subscapularis, BIL C4-C5 cervical multifidi, BIL suboccipitals, lumbar paraspinals (estim) Electrical stimulation performed: YES Parameters: 5 min low frequency - milli amps - low intensity; 5 min - micro amps - high frequency high intensity. Treatment response/outcome: Improved muscle extensibility Neuromuscular re-ed: N/A Therapeutic Activity: N/A Modalities: N/A Self Care: N/A      ASSESSMENT: CLINICAL IMPRESSION: Rilie tolerated session well with no adverse reaction.  She continues to improve her periscapular strength and activity tolerance (including lifting and reaching).  Her FOTO score shows minimal improvement however  and she remains somewhat fearful about agging her shoulder at home.  Her MD would like her to continue PT (per pt).  She has had some significant improvement in pain since starting PT.  She states that PT is helpful, but does have difficulty articulating how.  I am ok with extending for 4 more weeks.  If she feels she needs to continue at this point for TDN, will direct her to a practice that specializes in TDN.    OBJECTIVE IMPAIRMENTS decreased activity tolerance, decreased coordination, decreased mobility, decreased ROM, decreased strength, hypomobility, increased muscle spasms, impaired flexibility, impaired UE functional use, postural dysfunction, and pain.    ACTIVITY LIMITATIONS carrying, reach over head, and locomotion level   PARTICIPATION LIMITATIONS: meal prep, community activity, and occupation   [place] are also affecting patient's functional outcome.      GOALS: Goals reviewed with patient? Yes   SHORT TERM GOALS: Target date: 07/05/2022    Pt will be indep with initial HEP to assist with pain management Baseline: initiated at eval Goal status: MET   2.  Pt will report 25% improvement in L shoulder symptoms with functional activities at work Baseline: 100% present 7/26: 25% improvement Goal status: MET     LONG TERM GOALS: Target date:  09/06/22 (extended to 10/24/2022)   Pt will be able to wipe down the scales with a circular motion at work without pain Baseline: 8/10; 07/26/22: 6/10 Goal status: ONGOING   2.  Pt will be able to perform left lateral cervical sidebend at work without pain Baseline: 10/10;  09/19/2022: 8/10 10/20: 6/10 Goal status: ONGOING   3.  Pt will be able to mop/sweep without pain Baseline: 6/10; 07/26/22: 7-8/10 09/19/2022: 8/10 10/20: 6/10 Goal status: ONGOING   4.  Pt will have improved FOTO score to >/=57% function Baseline: 40% function 07/03/2022: 32% 08/16/2022: 44% 10/20: 42% Goal status: ONGOING   5.  Pt will  demo improvement in L shoulder strength to >/= 4+/5 to assist with work duties Baseline: 3+/5 throughout except mid trap/teres 2-/5 07/03/2022: 4+/5 Goal status: ACHIEVED     PLAN: PT FREQUENCY: 2x/week   PT DURATION: other: 6 weeks (extended to 11/17)   PLANNED INTERVENTIONS: Therapeutic exercises, Therapeutic activity, Neuromuscular re-education, Patient/Family education, Joint mobilization, Dry Needling, Moist heat, Taping, Ultrasound, Manual therapy, and Re-evaluation   PLAN FOR NEXT SESSION:  L clavicular/scapular mobs, manual therapy for L Upper Trap/Teres/Mid trap tightness; strengthening L shoulder, dry needling   Kevan Ny Reinhartsen PT 09/27/22 8:15 AM

## 2022-09-26 NOTE — Telephone Encounter (Signed)
Okay for refill?  

## 2022-09-26 NOTE — Telephone Encounter (Signed)
Pharmacy called stating patient is needing a PA on Dymista and is requesting a 3 month supply.

## 2022-09-26 NOTE — Patient Instructions (Addendum)
Good to see you  Pt referral  Labs on the way out  4 week follow up MS[facility]

## 2022-09-26 NOTE — Telephone Encounter (Signed)
PA has been submitted through CoverMyMeds and is currently pending approval/denial.

## 2022-09-27 ENCOUNTER — Ambulatory Visit: Payer: 59

## 2022-09-28 ENCOUNTER — Encounter (HOSPITAL_COMMUNITY): Payer: Self-pay | Admitting: Gastroenterology

## 2022-09-28 LAB — CALCIUM, IONIZED: Calcium, Ion: 4.9 mg/dL (ref 4.7–5.5)

## 2022-09-29 ENCOUNTER — Other Ambulatory Visit (HOSPITAL_COMMUNITY): Payer: Self-pay

## 2022-09-29 NOTE — Telephone Encounter (Signed)
Pa has been approved pharmacy has been notified.

## 2022-09-30 ENCOUNTER — Other Ambulatory Visit (HOSPITAL_COMMUNITY): Payer: Self-pay

## 2022-09-30 MED ORDER — ONDANSETRON 4 MG PO TBDP
4.0000 mg | ORAL_TABLET | Freq: Three times a day (TID) | ORAL | 3 refills | Status: DC | PRN
  Filled 2022-09-30: qty 30, 5d supply, fill #0
  Filled 2022-11-26: qty 30, 5d supply, fill #1
  Filled 2023-03-13: qty 60, 10d supply, fill #2

## 2022-09-30 MED ORDER — RIZATRIPTAN BENZOATE 10 MG PO TBDP
10.0000 mg | ORAL_TABLET | ORAL | 3 refills | Status: DC | PRN
  Filled 2022-09-30 – 2022-11-26 (×2): qty 27, 90d supply, fill #0

## 2022-10-01 ENCOUNTER — Other Ambulatory Visit (HOSPITAL_COMMUNITY): Payer: Self-pay

## 2022-10-03 DIAGNOSIS — R600 Localized edema: Secondary | ICD-10-CM | POA: Diagnosis not present

## 2022-10-03 DIAGNOSIS — I872 Venous insufficiency (chronic) (peripheral): Secondary | ICD-10-CM | POA: Diagnosis not present

## 2022-10-04 ENCOUNTER — Ambulatory Visit: Payer: 59 | Admitting: Physical Therapy

## 2022-10-06 ENCOUNTER — Other Ambulatory Visit (HOSPITAL_COMMUNITY): Payer: Self-pay

## 2022-10-07 ENCOUNTER — Other Ambulatory Visit (HOSPITAL_COMMUNITY): Payer: Self-pay

## 2022-10-07 DIAGNOSIS — R1310 Dysphagia, unspecified: Secondary | ICD-10-CM

## 2022-10-08 DIAGNOSIS — I83893 Varicose veins of bilateral lower extremities with other complications: Secondary | ICD-10-CM | POA: Diagnosis not present

## 2022-10-09 ENCOUNTER — Other Ambulatory Visit (HOSPITAL_COMMUNITY): Payer: Self-pay

## 2022-10-10 ENCOUNTER — Ambulatory Visit: Payer: 59 | Attending: Sports Medicine | Admitting: Physical Therapy

## 2022-10-10 ENCOUNTER — Encounter: Payer: Self-pay | Admitting: Physical Therapy

## 2022-10-10 ENCOUNTER — Encounter: Payer: Self-pay | Admitting: Sports Medicine

## 2022-10-10 DIAGNOSIS — M542 Cervicalgia: Secondary | ICD-10-CM | POA: Insufficient documentation

## 2022-10-10 DIAGNOSIS — R293 Abnormal posture: Secondary | ICD-10-CM | POA: Insufficient documentation

## 2022-10-10 NOTE — Therapy (Signed)
OUTPATIENT PHYSICAL THERAPY TREATMENT NOTE   Patient Name: Donna Park MRN: [id number] DOB:1971/05/13, 51 y.o., female Today's Date: 10/10/2022  PCP: Dorothyann Peng REFERRING PROVIDER: [name]   PT End of Session - 10/10/22 0905     Visit Number 18    Date for PT Re-Evaluation 10/24/22    Authorization Type UMR    PT Start Time 0905    PT Stop Time 1010    PT Time Calculation (min) 65 min    Activity Tolerance Patient tolerated treatment well    Behavior During Therapy [facility] for tasks assessed/performed                      Past Medical History:  Diagnosis Date   Allergy    Anxiety    Arthritis    Asthma    Chicken pox    Complication of anesthesia    slow to wake up from anesthesia   Depression    DJD (degenerative joint disease)    GERD (gastroesophageal reflux disease)    resolved after gastric surgery   H/O gastric bypass 2016   History of iron deficiency anemia    HSV infection    Migraines    Obese    Pneumonia    Childhood history   PONV (postoperative nausea and vomiting)    Status post dilation of esophageal narrowing    Varicose vein of leg    Past Surgical History:  Procedure Laterality Date   ANKLE FRACTURE SURGERY Left 03/08/2009   BREAST SURGERY Bilateral    Cyst Removal   ESOPHAGEAL MANOMETRY N/A 09/24/2022   Procedure: ESOPHAGEAL MANOMETRY (EM);  Surgeon: Lavena Bullion, DO;  Location: [facility];  Service: Gastroenterology;  Laterality: N/A;   GASTRIC BYPASS  06/18/2015   LAPAROSCOPIC VAGINAL HYSTERECTOMY WITH SALPINGECTOMY Bilateral 05/15/2020   Procedure: LAPAROSCOPIC ASSISTED VAGINAL HYSTERECTOMY WITH SALPINGECTOMY;  Surgeon: Louretta Shorten, MD;  Location: [facility];  Service: Gynecology;  Laterality: Bilateral;  need bed   LASER ABLATION     Varicose veins   TONSILLECTOMY AND ADENOIDECTOMY  2001   TUBAL LIGATION  12/19/1999   UPPER GASTROINTESTINAL ENDOSCOPY     UPPER GI ENDOSCOPY     Uterine  ablation  12/2018   Patient Active Problem List   Diagnosis Date Noted   Dysphagia    LUQ pain 03/01/2022   Change in bowel habits 03/01/2022   Loss of weight 03/01/2022   Chronic migraine without aura without status migrainosus, not intractable 06/14/2021   S/P laparoscopic assisted vaginal hysterectomy (LAVH) 05/15/2020   Migraines    GERD (gastroesophageal reflux disease)    Asthma    Seasonal and perennial allergic rhinitis 02/10/2019   Moderate persistent asthma, uncomplicated 82/50/5397   Anaphylactic shock due to adverse food reaction 02/10/2019   Atopic dermatitis 02/10/2019   History of Roux-en-Y gastric bypass 2016    THERAPY DIAG:  Cervicalgia  Abnormal posture  REFERRING DIAG:  M54.2 (ICD-10-CM) - Neck pain  M99.01 (ICD-10-CM) - Somatic dysfunction of cervical region  M99.02 (ICD-10-CM) - Somatic dysfunction of thoracic region  M99.08 (ICD-10-CM) - Somatic dysfunction of rib region  M25.512 (ICD-10-CM) - Acute pain of left shoulder    PERTINENT HISTORY: repeat trigger point injections and has received OMT treatments with last being 05/23/22 by Glennon Mac, DO.  PRECAUTIONS/RESTRICTIONS:   No  SUBJECTIVE:   Pt reports that her neck is very stiff today.  She thinks this may be linked to doing more  typing lately.    Pain: 5-6/10 L > R shoulder, parascapular, neck and low back Aggs: shoulder abd/ext combo Eases: rest   OBJECTIVE: PATIENT SURVEYS:  FOTO 40% function 07/03/2022: 32% function 08/16/2022: 44%     COGNITION: Overall cognitive status: Within functional limits for tasks assessed     SENSATION: WFL   POSTURE:  sits with left lateral trunk lean, R shoulder heigher, R scapula lower, rounded shoulders bil, slight forward head, increased lumbar lordosis, L clavicle higher, L clavicular notch heigher, audible pop under L acromion    PALPATION: T2, T4-5, T7-8 hypomobile, pt with TTP along L Upper Trap,mid Trap, Teres; see posture section,  coracoid process/acromion heigher left   CERVICAL ROM:    Active ROM A/PROM (deg) eval  Flexion WFL  Extension WFL  Right lateral flexion WFL  Left lateral flexion WFL  Right rotation WFL  Left rotation WFL   (Blank rows = not tested)   UPPER EXTREMITY MMT:   MMT Right eval Left eval Left 07/03/2022 07/26/22  Shoulder flexion 4+/5 3+/5 with pain  4/5, p!  Shoulder extension       Shoulder abduction 4+/5 3+/5 4+/5  Shoulder adduction 4+/5 3+/5 4+/5, p!  Shoulder extension       Shoulder internal rotation 4+/5 3+/5 5/5  Shoulder external rotation 4+/5 3+/5 4+/5  Elbow flexion       Elbow extension       Wrist flexion       Wrist extension       Wrist ulnar deviation       Wrist radial deviation       Wrist pronation       Wrist supination        (Blank rows = not tested) mid-Trap,Teres 2-/5   UPPER EXTREMITY AROM:   MMT Right eval Left eval  Shoulder flexion [facility] with pain  Shoulder extension      Shoulder abduction [facility]  Shoulder adduction [facility]  Shoulder extension      Shoulder internal rotation [facility]  Shoulder external rotation WFL WFL                                                                                (Blank rows = not tested)   CERVICAL SPECIAL TESTS:  NT    TODAY'S TREATMENT:   [facility] Adult PT Treatment:                                                DATE: 10/10/2022 Therapeutic Exercise:  Elliptical - 5' for warm up  Standing low rows with 35# cable 2x10 Standing high rows with 35# cable 2x10 Seated lat pull-downs with 35# cable 2x10 Chest press 3x10 @ 15# Chest fly - 2x10 _0 # 90-90 walk 9# - 30' x3 ea Standing BIL shoulder scaption with 4# dumbbells and with chin tuck isometric into ball at wall 3x10 Standing fwd OH press 3x10 - 5# TWIY 2x10  Manual Therapy: Skilled palpation to identify trigger points prior to [facility] Effleurage/ STM to all  musculature following TPDN   Trigger Point Dry-Needling  Treatment  instructions: Expect mild to moderate muscle soreness. S/S of pneumothorax if dry needled over a lung field, and to seek immediate medical attention should they occur. Patient verbalized understanding of these instructions and education.  Patient Consent Given: Yes Education handout provided: No Muscles treated: BIL UT, BIL subscapularis, BIL C4-C5 cervical multifidi, BIL suboccipitals, lumbar paraspinals (estim) Electrical stimulation performed: YES (lumbar paraspinals) Parameters: 25 min low frequency - milli amps - low intensity Treatment response/outcome: Improved muscle extensibility  [facility] Adult PT Treatment:                                                DATE: 09/19/2022 Therapeutic Exercise: Seated low rows with 35# with chin tuck hold 2x10 Seated high rows with 35# chin tuck hold 2x10 Seated lat pull-downs with 35# chin tuck hold 2x10 Standing BIL shoulder scaption with 4# dumbbells and with chin tuck into ball at wall 3x10 Manual Therapy: Prone effleurage/ STM to BIL mid trap/ rhomboids x10 minutes Supine effleurage to cervical paraspinals/ UT x5 minutes Supine manual cervical distraction x3 minutes Neuromuscular re-ed: N/A Therapeutic Activity: N/A Modalities: N/A Self Care: N/A   [facility] Adult PT Treatment:                                                DATE: 09/05/2022 Therapeutic Exercise: Seated levator scap stretch x3mn BIL Standing low rows with 30# cable 2x10 Standing high rows with 30# cable 2x10 Seated lat pull-downs with 30# cable 2x10 OH press 6# - 3x10 Standing BIL shoulder scaption with 3# dumbbells and with chin tuck isometric into ball at wall 3x10  Manual Therapy: Skilled palpation to identify trigger points prior to TPDN Effleurage/ STM to all musculature following TPDN   Trigger Point Dry-Needling  Treatment instructions: Expect mild to moderate muscle soreness. S/S of pneumothorax if dry needled over a lung field, and to seek immediate medical  attention should they occur. Patient verbalized understanding of these instructions and education.  Patient Consent Given: Yes Education handout provided: No Muscles treated: BIL UT, BIL subscapularis, BIL C4-C5 cervical multifidi, BIL suboccipitals, lumbar paraspinals (estim) Electrical stimulation performed: YES Parameters: 5 min low frequency - milli amps - low intensity; 5 min - micro amps - high frequency high intensity. Treatment response/outcome: Improved muscle extensibility  [facility] Adult PT Treatment:                                                DATE: 08/29/2022 Therapeutic Exercise: Seated levator scap stretch x18m BIL Seated low rows with 40# cable 2x10 Seated high rows with 40# cable 2x10 Seated lat pull-downs with 40# cable 2x10 Seated shoulder rolls with chin tuck 2x10 forward and backward Standing BIL shoulder scaption with 3# dumbbells and with chin tuck isometric into ball at wall 3x10 Manual Therapy: Skilled palpation to identify trigger points prior to TPDN Effleurage/ STM to all musculature following TPDN   Trigger Point Dry-Needling  Treatment instructions: Expect mild to moderate muscle soreness. S/S of pneumothorax if dry needled over a lung  field, and to seek immediate medical attention should they occur. Patient verbalized understanding of these instructions and education.  Patient Consent Given: Yes Education handout provided: No Muscles treated: BIL UT, BIL subscapularis, BIL C4-C5 cervical multifidi, BIL suboccipitals, lumbar paraspinals (estim) Electrical stimulation performed: YES Parameters: 5 min low frequency - milli amps - low intensity; 5 min - micro amps - high frequency high intensity. Treatment response/outcome: Improved muscle extensibility Neuromuscular re-ed: N/A Therapeutic Activity: N/A Modalities: N/A Self Care: N/A      ASSESSMENT: CLINICAL IMPRESSION: Natalyia tolerated session well with no adverse reaction.  We were able to  substantially increase volume of therex today as we had a 1+ hours session.  Emonee fatigues rapidly with OH movements and shows overall low muscle bulk of periscapular muscles.  I encouraged her to look into joining a gym.  We discussed the benefits of strength training.  Pt has significant pain reduction following MT and estim.    OBJECTIVE IMPAIRMENTS decreased activity tolerance, decreased coordination, decreased mobility, decreased ROM, decreased strength, hypomobility, increased muscle spasms, impaired flexibility, impaired UE functional use, postural dysfunction, and pain.    ACTIVITY LIMITATIONS carrying, reach over head, and locomotion level   PARTICIPATION LIMITATIONS: meal prep, community activity, and occupation   Guys are also affecting patient's functional outcome.      GOALS: Goals reviewed with patient? Yes   SHORT TERM GOALS: Target date: 07/05/2022    Pt will be indep with initial HEP to assist with pain management Baseline: initiated at eval Goal status: MET   2.  Pt will report 25% improvement in L shoulder symptoms with functional activities at work Baseline: 100% present 7/26: 25% improvement Goal status: MET     LONG TERM GOALS: Target date:  09/06/22 (extended to 10/24/2022)   Pt will be able to wipe down the scales with a circular motion at work without pain Baseline: 8/10; 07/26/22: 6/10 Goal status: ONGOING   2.  Pt will be able to perform left lateral cervical sidebend at work without pain Baseline: 10/10;  09/19/2022: 8/10 10/20: 6/10 Goal status: ONGOING   3.  Pt will be able to mop/sweep without pain Baseline: 6/10; 07/26/22: 7-8/10 09/19/2022: 8/10 10/20: 6/10 Goal status: ONGOING   4.  Pt will have improved FOTO score to >/=57% function Baseline: 40% function 07/03/2022: 32% 08/16/2022: 44% 10/20: 42% Goal status: ONGOING   5.  Pt will demo improvement in L shoulder strength to >/= 4+/5 to assist with work  duties Baseline: 3+/5 throughout except mid trap/teres 2-/5 07/03/2022: 4+/5 Goal status: ACHIEVED     PLAN: PT FREQUENCY: 2x/week   PT DURATION: other: 6 weeks (extended to 11/17)   PLANNED INTERVENTIONS: Therapeutic exercises, Therapeutic activity, Neuromuscular re-education, Patient/Family education, Joint mobilization, Dry Needling, Moist heat, Taping, Ultrasound, Manual therapy, and Re-evaluation   PLAN FOR NEXT SESSION:  L clavicular/scapular mobs, manual therapy for L Upper Trap/Teres/Mid trap tightness; strengthening L shoulder, dry needling   Kevan Ny Reinhartsen PT 10/10/22 10:17 AM

## 2022-10-14 ENCOUNTER — Encounter: Payer: Self-pay | Admitting: Gastroenterology

## 2022-10-15 NOTE — Telephone Encounter (Signed)
Sorry, please disregard. I have the result note in my box.

## 2022-10-16 ENCOUNTER — Ambulatory Visit: Payer: 59

## 2022-10-17 ENCOUNTER — Ambulatory Visit: Payer: 59 | Admitting: Physical Therapy

## 2022-10-20 ENCOUNTER — Other Ambulatory Visit: Payer: Self-pay | Admitting: Adult Health

## 2022-10-20 ENCOUNTER — Other Ambulatory Visit (HOSPITAL_COMMUNITY): Payer: Self-pay

## 2022-10-20 ENCOUNTER — Other Ambulatory Visit: Payer: Self-pay | Admitting: Allergy & Immunology

## 2022-10-20 DIAGNOSIS — G43919 Migraine, unspecified, intractable, without status migrainosus: Secondary | ICD-10-CM

## 2022-10-22 ENCOUNTER — Other Ambulatory Visit (HOSPITAL_COMMUNITY): Payer: Self-pay

## 2022-10-22 MED ORDER — IBUPROFEN 800 MG PO TABS
800.0000 mg | ORAL_TABLET | Freq: Three times a day (TID) | ORAL | 0 refills | Status: DC | PRN
  Filled 2022-10-22: qty 270, 90d supply, fill #0

## 2022-10-22 MED ORDER — ACYCLOVIR 5 % EX OINT
1.0000 | TOPICAL_OINTMENT | CUTANEOUS | 1 refills | Status: DC
  Filled 2022-10-22: qty 15, 7d supply, fill #0
  Filled 2022-11-26: qty 15, 7d supply, fill #1

## 2022-10-22 NOTE — Telephone Encounter (Signed)
Ok to send in.  

## 2022-10-23 ENCOUNTER — Other Ambulatory Visit (HOSPITAL_COMMUNITY): Payer: Self-pay

## 2022-10-23 NOTE — Progress Notes (Signed)
Donna Park Donna Park Phone: [phone]   Assessment and Plan:    1. Chronic left shoulder pain 2. Neck pain 3. Chronic bilateral low back pain with right-sided sciatica 4. Somatic dysfunction of thoracic region 5. Somatic dysfunction of lumbar region 6. Somatic dysfunction of cervical region 7. Somatic dysfunction of rib region 8. Somatic dysfunction of pelvic region -Chronic exacerbation, subsequent visit - Recurrence of multiple musculoskeletal complaints with left-sided cervical paraspinal, left-sided trapezius, left-sided rhomboids all flaring since patient has changed to all work from home - Stressed the importance of ergonomic changes to make shoulders and trapezius relaxed when working from home - Patient elected for repeat trigger point injections which have been beneficial in the past.  Tolerated well per note below - Patient has received significant relief with OMT in the past.  Elects for repeat OMT today.  Tolerated well per note below. - Decision today to treat with OMT was based on Physical Exam  After verbal consent patient was treated with HVLA (high velocity low amplitude), ME (muscle energy), FPR (flex positional release), ST (soft tissue), PC/PD (Pelvic Compression/ Pelvic Decompression) techniques in cervical, rib, thoracic, lumbar, and pelvic areas. Patient tolerated the procedure well with improvement in symptoms.  Patient educated on potential side effects of soreness and recommended to rest, hydrate, and use Tylenol as needed for pain control.  Trigger Point Injection: After informed consent was obtained, skin cleaned with alcohol  prep.  A total of 6 trigger points identified along left cervical paraspinal, left trapezius, left rhomboids.  Injections given over area of pain for total injection of 5 ml lidocaine 1% w/o epi and 1 mL Kenalog 40 mg/ML.  Patient had relief after the  injection without side effects.  Pt given signs of infection to watch for.   9. Osteoporosis without current pathological fracture, unspecified osteoporosis type -Chronic - Patient with T score -2.9 on DEXA scan from 08/12/2022 indicating osteoporosis - Patient has been taking vitamin D 50,000 units and calcium with calcium and vitamin D levels currently WNL after checking on office visit on 09/26/2022 - Patient has been following with GI and has a history of esophageal narrowing with dilation procedure.  Due to alendronate potential GI side effects, we will wait for full results from GI before starting medication - If GI provides clearance, patient could start alendronate 70 mg weekly after her 51th birthday on 01/25/2021 - alendronate (FOSAMAX) 70 MG tablet; Take 1 tablet (70 mg total) by mouth every 7 (seven) days.    Pertinent previous records reviewed include none   Follow Up: 4 weeks for reevaluation.  Could consider repeat OMT.  Could discuss GI recommendations   Subjective:   I, Donna Park, am serving as a Education administrator for Donna Park   Chief Complaint: left shoulder pain and tightness   HPI:  01/31/2022 Patient is a 51 year old female complaining of neck and shoulder pain. Patient states been going on for about 2 weeks has knots in her shoulders both neck and shoulders have been stiff , interested in OMT , knots in her upper trap , low back and her sciatic nerve on her right side flares up when she's having intercourse goes down her leg and upper her back    02/21/2022 Patient states that her neck and her shoulders are still bothering her after her adjustment she was very tight is still tight in the upper back , still wants  the adjustment will get a massage later tonight as well  , hip still hurts , her tailbone she woke up one morning with pain     03/06/2022 Patient states still has upper trap tightness, finder her self leaning on that left side    03/28/2022 Patient  states that she is still tight time for an adjustment would like to discuss a lower spot for CSI    04/25/2022 Patient states that her neck is heavy, would like a referral for MRI, still neck tightness and pain,   05/23/2022 Patient states she is ready for the OMT but the left shoulder clavicle have been sore, neck and shoulder still hurts. Patient has had the MRI of the ankle this morning. Patient wanting to know if she can get another shoulder injection today    06/20/2022 Patient states that she is a little better decreased ROM but is going to PT , ankle feels better bought different shoes    08/08/2022  Patient states shoulder still hurts has been dry needling and want to talk talk about lidocaine again and then low back pain want referral for PT and sciatic pain    08/29/2022 Patient states wants OMT, knot still hurts    09/26/2022 Patient states that her shoulder still hurts , last week her bones were hurting , body is achy and painful    10/24/2022 Patient states that the bones and the back of her neck are getting tight and painful same spot as when she came in the first time, also the upper trap knot coming back    Relevant Historical Information: History of migraines being treated with Botox injections  Additional pertinent review of systems negative.   Current Outpatient Medications:    acetaminophen (TYLENOL) 500 MG tablet, Take by mouth., Disp: , Rfl:    acyclovir ointment (ZOVIRAX) 5 %, Apply onto the affected area every 3 hours., Disp: 15 g, Rfl: 1   albuterol (VENTOLIN HFA) 108 (90 Base) MCG/ACT inhaler, Inhale 1 - 2 puffs into the lungs every 6 hours as needed for wheezing or shortness of breath., Disp: 20.1 g, Rfl: 0   [START ON 01/25/2023] alendronate (FOSAMAX) 70 MG tablet, Take 1 tablet (70 mg total) by mouth every 7 (seven) days., Disp: 13 tablet, Rfl: 0   amitriptyline (ELAVIL) 10 MG tablet, Take 1 tablet (10 mg total) by mouth at bedtime., Disp: 90 tablet, Rfl: 1    azelastine (ASTELIN) 0.1 % nasal spray, Place 2 sprays into both nostrils 2 (two) times daily., Disp: 30 mL, Rfl: 5   Azelastine-Fluticasone (DYMISTA) 137-50 MCG/ACT SUSP, Place 2 sprays into both nostrils 2 (two) times daily as needed., Disp: 23 g, Rfl: 5   CALCIUM PO, Take 1 tablet by mouth 4 (four) times a week., Disp: , Rfl:    cetirizine (ZYRTEC) 10 MG tablet, Take 2 tablets (20 mg total) by mouth 2 (two) times daily., Disp: 360 tablet, Rfl: 1   Cyanocobalamin (NASCOBAL NA), Place 1 spray into the nose every Sunday., Disp: , Rfl:    diazepam (VALIUM) 10 MG tablet, Take 1 tablet (10 mg total) by mouth at bedtime as needed for sleep., Disp: 30 tablet, Rfl: 2   EPINEPHrine 0.3 mg/0.3 mL IJ SOAJ injection, Inject 0.3 mg into the muscle as needed for anaphylaxis., Disp: 2 each, Rfl: 1   estradiol (ESTRACE) 2 MG tablet, Take 1 tablet (2 mg total) by mouth daily., Disp: 90 tablet, Rfl: 12   estradiol (ESTRACE) 2 MG tablet, Take  1 tablet (2 mg total) by mouth daily., Disp: 30 tablet, Rfl: 12   fluconazole (DIFLUCAN) 150 MG tablet, Take 1 tablet (150 mg total) by mouth daily., Disp: 14 tablet, Rfl: 5   fluticasone (FLONASE) 50 MCG/ACT nasal spray, Place 2 sprays into both nostrils daily., Disp: 16 g, Rfl: 5   Fluticasone-Umeclidin-Vilant (TRELEGY ELLIPTA) 200-62.5-25 MCG/ACT AEPB, Inhale 1 puff into the lungs daily., Disp: 60 each, Rfl: 5   Galcanezumab-gnlm (EMGALITY) 120 MG/ML SOAJ, Inject 120 mg into the skin every 30 (thirty) days., Disp: 3 mL, Rfl: 0   Hypertonic Nasal Wash (SINUS RINSE REFILL) PACK, Use one packet dissolved in water as needed. (Patient taking differently: Place 1 each into the nose in the morning and at bedtime.), Disp: 100 each, Rfl: 5   ibuprofen (ADVIL) 800 MG tablet, Take 1 tablet (800 mg total) by mouth 3 (three) times daily as needed (pain.)., Disp: 270 tablet, Rfl: 0   ipratropium (ATROVENT) 0.06 % nasal spray, Place 2 sprays into both nostrils 4 (four) times daily., Disp: 45  mL, Rfl: 3   linaclotide (LINZESS) 145 MCG CAPS capsule, Take 1 capsule (145 mcg total) by mouth daily before breakfast., Disp: 60 capsule, Rfl: 3   linaclotide (LINZESS) 290 MCG CAPS capsule, Take 1 capsule (290 mcg total) by mouth daily before breakfast., Disp: 90 capsule, Rfl: 1   meclizine (ANTIVERT) 25 MG tablet, Take 25 mg by mouth 3 (three) times daily as needed (dizziness/vertigo). , Disp: , Rfl:    meloxicam (MOBIC) 15 MG tablet, Take 1 tablet (15 mg total) by mouth daily., Disp: 30 tablet, Rfl: 0   MUCUS RELIEF ER 600 MG 12 hr tablet, Take 600-1,200 mg by mouth 2 (two) times daily as needed (congestion). , Disp: , Rfl:    ondansetron (ZOFRAN-ODT) 4 MG disintegrating tablet, Dissolve 1-2 tablets (4-8 mg total) by mouth every 8 (eight) hours as needed. May take with Rizatriptan, Disp: 30 tablet, Rfl: 3   pantoprazole (PROTONIX) 40 MG tablet, Take 1 tablet (40 mg total) by mouth at bedtime., Disp: 90 tablet, Rfl: 3   Pediatric Multiple Vit-C-FA (MULTIVITAMIN ANIMAL SHAPES, WITH CA/FA,) with C & FA chewable tablet, Chew 2 tablets by mouth daily., Disp: , Rfl:    Rimegepant Sulfate (NURTEC) 75 MG TBDP, Dissolve 1 tablet by mouth daily as needed for migraines. Take as close to onset of migraine as possible. One daily maximum., Disp: 16 tablet, Rfl: 11   rizatriptan (MAXALT-MLT) 10 MG disintegrating tablet, Dissolve 1 tablet (10 mg total) by mouth as needed at onset of migraine. May take another tablet 2 hours later if needed, max 2 tablets in 24 hours., Disp: 27 tablet, Rfl: 3   simethicone (MYLICON) 80 MG chewable tablet, Chew 80 mg by mouth every 6 (six) hours as needed for flatulence., Disp: , Rfl:    tezepelumab-ekko (TEZSPIRE) 210 MG/1.91ML syringe, Inject 1.91 mLs (210 mg total) into the skin every 28 (twenty-eight) days., Disp: 1.91 mL, Rfl: 11   tiZANidine (ZANAFLEX) 4 MG tablet, Take 1 tablet by mouth every 8  hours as needed., Disp: 10 tablet, Rfl: 0   triamcinolone ointment (KENALOG) 0.1  %, APPLY TO AFFECTED AREA(S) 2 TIMES A DAY, Disp: 454 g, Rfl: 2   valACYclovir (VALTREX) 500 MG tablet, Take 500 mg by mouth 2 (two) times daily., Disp: , Rfl:    valACYclovir (VALTREX) 500 MG tablet, Take 2 tablets (1,000 mg total) by mouth 2 (two) times daily as needed for acute flares, Disp: 120  tablet, Rfl: 1   Vibegron (GEMTESA) 75 MG TABS, Take 1 tablet by mouth daily., Disp: 30 tablet, Rfl: 11   Vibegron (GEMTESA) 75 MG TABS, Take 1 tablet by mouth daily., Disp: 90 tablet, Rfl: 4   Vitamin D, Ergocalciferol, (DRISDOL) 1.25 MG (50000 UNIT) CAPS capsule, Take 1 capsule by mouth every 7 days., Disp: 12 capsule, Rfl: 3   fluticasone-salmeterol (ADVAIR HFA) 230-21 MCG/ACT inhaler, INHALE 2 PUFFS INTO THE LUNGS 2 (TWO) TIMES DAILY., Disp: 36 g, Rfl: 1  Current Facility-Administered Medications:    tezepelumab-ekko (TEZSPIRE) 210 MG/1.91ML syringe 210 mg, 210 mg, Subcutaneous, Q28 days, Valentina Shaggy, MD, 210 mg at 09/18/22 1752   Objective:     Vitals:   10/24/22 0845  BP: 108/78  Pulse: 82  SpO2: 93%  Weight: 175 lb (79.4 kg)  Height: '5\' 6"'$  (1.676 m)      Body mass index is 28.25 kg/m.    Physical Exam:    General: Well-appearing, cooperative, sitting comfortably in no acute distress.   OMT Physical Exam:  ASIS Compression Test: Positive Right Cervical: TTP paraspinal, C3 RLSR Rib: Bilateral elevated first rib with TTP Thoracic: TTP paraspinal, T4-7 RRSL Lumbar: TTP paraspinal, L1-3 RRSL Pelvis: Right anterior innominate    Electronically signed by:  Donna Park D.[facility]na [facility] 9:58 AM 10/24/22

## 2022-10-24 ENCOUNTER — Ambulatory Visit (INDEPENDENT_AMBULATORY_CARE_PROVIDER_SITE_OTHER): Payer: 59 | Admitting: Sports Medicine

## 2022-10-24 ENCOUNTER — Ambulatory Visit: Payer: 59 | Admitting: Physical Therapy

## 2022-10-24 ENCOUNTER — Other Ambulatory Visit (HOSPITAL_COMMUNITY): Payer: Self-pay

## 2022-10-24 ENCOUNTER — Encounter: Payer: Self-pay | Admitting: Physical Therapy

## 2022-10-24 VITALS — BP 108/78 | HR 82 | Ht 66.0 in | Wt 175.0 lb

## 2022-10-24 DIAGNOSIS — M25512 Pain in left shoulder: Secondary | ICD-10-CM | POA: Diagnosis not present

## 2022-10-24 DIAGNOSIS — M542 Cervicalgia: Secondary | ICD-10-CM

## 2022-10-24 DIAGNOSIS — R293 Abnormal posture: Secondary | ICD-10-CM | POA: Diagnosis not present

## 2022-10-24 DIAGNOSIS — M5441 Lumbago with sciatica, right side: Secondary | ICD-10-CM | POA: Diagnosis not present

## 2022-10-24 DIAGNOSIS — M9902 Segmental and somatic dysfunction of thoracic region: Secondary | ICD-10-CM

## 2022-10-24 DIAGNOSIS — M9903 Segmental and somatic dysfunction of lumbar region: Secondary | ICD-10-CM

## 2022-10-24 DIAGNOSIS — M9905 Segmental and somatic dysfunction of pelvic region: Secondary | ICD-10-CM

## 2022-10-24 DIAGNOSIS — M9908 Segmental and somatic dysfunction of rib cage: Secondary | ICD-10-CM | POA: Diagnosis not present

## 2022-10-24 DIAGNOSIS — G8929 Other chronic pain: Secondary | ICD-10-CM | POA: Diagnosis not present

## 2022-10-24 DIAGNOSIS — M9901 Segmental and somatic dysfunction of cervical region: Secondary | ICD-10-CM

## 2022-10-24 DIAGNOSIS — M810 Age-related osteoporosis without current pathological fracture: Secondary | ICD-10-CM

## 2022-10-24 MED ORDER — ALENDRONATE SODIUM 70 MG PO TABS
70.0000 mg | ORAL_TABLET | ORAL | 0 refills | Status: DC
  Filled 2022-10-24: qty 12, 84d supply, fill #0
  Filled 2023-06-25: qty 12, 84d supply, fill #1

## 2022-10-24 NOTE — Therapy (Signed)
OUTPATIENT PHYSICAL THERAPY TREATMENT NOTE   Patient Name: Donna Park MRN: [id number] DOB:03-Feb-1971, 51 y.o., female Today's Date: 10/24/2022  PCP: Dorothyann Peng REFERRING PROVIDER: [name]   PT End of Session - 10/24/22 1046     Visit Number 19    Date for PT Re-Evaluation 10/24/22    Authorization Type U[employer]    PT Start Time 1045    PT Stop Time 1126    PT Time Calculation (min) 41 min    Activity Tolerance Patient tolerated treatment well    Behavior During Therapy WFL for tasks assessed/performed                      Past Medical History:  Diagnosis Date   Allergy    Anxiety    Arthritis    Asthma    Chicken pox    Complication of anesthesia    slow to wake up from anesthesia   Depression    DJD (degenerative joint disease)    GERD (gastroesophageal reflux disease)    resolved after gastric surgery   H/O gastric bypass 2016   History of iron deficiency anemia    HSV infection    Migraines    Obese    Pneumonia    Childhood history   PONV (postoperative nausea and vomiting)    Status post dilation of esophageal narrowing    Varicose vein of leg    Past Surgical History:  Procedure Laterality Date   ANKLE FRACTURE SURGERY Left 03/08/2009   BREAST SURGERY Bilateral    Cyst Removal   ESOPHAGEAL MANOMETRY N/A 09/24/2022   Procedure: ESOPHAGEAL MANOMETRY (EM);  Surgeon: Lavena Bullion, DO;  Location: [facility];  Service: Gastroenterology;  Laterality: N/A;   GASTRIC BYPASS  06/18/2015   LAPAROSCOPIC VAGINAL HYSTERECTOMY WITH SALPINGECTOMY Bilateral 05/15/2020   Procedure: LAPAROSCOPIC ASSISTED VAGINAL HYSTERECTOMY WITH SALPINGECTOMY;  Surgeon: Louretta Shorten, MD;  Location: [facility];  Service: Gynecology;  Laterality: Bilateral;  need bed   LASER ABLATION     Varicose veins   TONSILLECTOMY AND ADENOIDECTOMY  2001   TUBAL LIGATION  12/19/1999   UPPER GASTROINTESTINAL ENDOSCOPY     UPPER GI ENDOSCOPY     Uterine  ablation  12/2018   Patient Active Problem List   Diagnosis Date Noted   Dysphagia    LUQ pain 03/01/2022   Change in bowel habits 03/01/2022   Loss of weight 03/01/2022   Chronic migraine without aura without status migrainosus, not intractable 06/14/2021   S/P laparoscopic assisted vaginal hysterectomy (LAVH) 05/15/2020   Migraines    GERD (gastroesophageal reflux disease)    Asthma    Seasonal and perennial allergic rhinitis 02/10/2019   Moderate persistent asthma, uncomplicated 06/06/1600   Anaphylactic shock due to adverse food reaction 02/10/2019   Atopic dermatitis 02/10/2019   History of Roux-en-Y gastric bypass 2016    THERAPY DIAG:  Cervicalgia  Abnormal posture  REFERRING DIAG:  M54.2 (ICD-10-CM) - Neck pain  M99.01 (ICD-10-CM) - Somatic dysfunction of cervical region  M99.02 (ICD-10-CM) - Somatic dysfunction of thoracic region  M99.08 (ICD-10-CM) - Somatic dysfunction of rib region  M25.512 (ICD-10-CM) - Acute pain of left shoulder    PERTINENT HISTORY: repeat trigger point injections and has received OMT treatments with last being 05/23/22 by Glennon Mac, DO.  PRECAUTIONS/RESTRICTIONS:   No  SUBJECTIVE:   Pt reports that she has been working at home more recently and this has cause her increased neck pain.  She feels that she is somewhat regressing.   Pain: 9/10 L > R shoulder, parascapular, neck and low back Aggs: shoulder abd/ext combo Eases: rest   OBJECTIVE: PATIENT SURVEYS:  FOTO 40% function 07/03/2022: 32% function 08/16/2022: 44%     COGNITION: Overall cognitive status: Within functional limits for tasks assessed     SENSATION: WFL   POSTURE:  sits with left lateral trunk lean, R shoulder heigher, R scapula lower, rounded shoulders bil, slight forward head, increased lumbar lordosis, L clavicle higher, L clavicular notch heigher, audible pop under L acromion    PALPATION: T2, T4-5, T7-8 hypomobile, pt with TTP along L Upper  Trap,mid Trap, Teres; see posture section, coracoid process/acromion heigher left   CERVICAL ROM:    Active ROM A/PROM (deg) eval  Flexion WFL  Extension WFL  Right lateral flexion WFL  Left lateral flexion WFL  Right rotation WFL  Left rotation WFL   (Blank rows = not tested)   UPPER EXTREMITY MMT:   MMT Right eval Left eval Left 07/03/2022 07/26/22  Shoulder flexion 4+/5 3+/5 with pain  4/5, p!  Shoulder extension       Shoulder abduction 4+/5 3+/5 4+/5  Shoulder adduction 4+/5 3+/5 4+/5, p!  Shoulder extension       Shoulder internal rotation 4+/5 3+/5 5/5  Shoulder external rotation 4+/5 3+/5 4+/5  Elbow flexion       Elbow extension       Wrist flexion       Wrist extension       Wrist ulnar deviation       Wrist radial deviation       Wrist pronation       Wrist supination        (Blank rows = not tested) mid-Trap,Teres 2-/5   UPPER EXTREMITY AROM:   MMT Right eval Left eval  Shoulder flexion [facility] with pain  Shoulder extension      Shoulder abduction [facility] WFL  Shoulder adduction [facility]  Shoulder extension      Shoulder internal rotation [facility]  Shoulder external rotation WFL WFL                                                                                (Blank rows = not tested)   CERVICAL SPECIAL TESTS:  NT    TODAY'S TREATMENT:  [facility] Adult PT Treatment:                                                DATE: 10/24/2022 Therapeutic Exercise:  Therapeutic Exercise:  Elliptical - 5' for warm up  Standing low rows with 35# cable 2x10 Standing high rows with 35# cable 2x10 Seated lat pull-downs with 35# cable 2x10 Chest press 3x10 @ 10# Chest fly - 2x10 _0 # TY 2x10  Manual Therapy: Skilled palpation to identify trigger points prior to TPDN Effleurage/ STM to all musculature following TPDN   Trigger Point Dry-Needling  Treatment instructions: Expect mild to moderate muscle soreness. S/S of pneumothorax if dry needled over a  lung field, and to seek immediate medical attention should they occur. Patient verbalized understanding of these instructions and education.  Patient Consent Given: Yes Education handout provided: No Muscles treated: BIL UT, BIL subscapularis, BIL C4-C5 cervical multifidi, BIL suboccipitals Electrical stimulation performed: NO (lumbar paraspinals) Parameters: NA Treatment response/outcome: Improved muscle extensibility; twitch  [facility] Adult PT Treatment:                                                DATE: 09/19/2022 Therapeutic Exercise: Seated low rows with 35# with chin tuck hold 2x10 Seated high rows with 35# chin tuck hold 2x10 Seated lat pull-downs with 35# chin tuck hold 2x10 Standing BIL shoulder scaption with 4# dumbbells and with chin tuck into ball at wall 3x10 Manual Therapy: Prone effleurage/ STM to BIL mid trap/ rhomboids x10 minutes Supine effleurage to cervical paraspinals/ UT x5 minutes Supine manual cervical distraction x3 minutes Neuromuscular re-ed: N/A Therapeutic Activity: N/A Modalities: N/A Self Care: N/A   [facility] Adult PT Treatment:                                                DATE: 09/05/2022 Therapeutic Exercise: Seated levator scap stretch x43mn BIL Standing low rows with 30# cable 2x10 Standing high rows with 30# cable 2x10 Seated lat pull-downs with 30# cable 2x10 OH press 6# - 3x10 Standing BIL shoulder scaption with 3# dumbbells and with chin tuck isometric into ball at wall 3x10  Manual Therapy: Skilled palpation to identify trigger points prior to TPDN Effleurage/ STM to all musculature following TPDN   Trigger Point Dry-Needling  Treatment instructions: Expect mild to moderate muscle soreness. S/S of pneumothorax if dry needled over a lung field, and to seek immediate medical attention should they occur. Patient verbalized understanding of these instructions and education.  Patient Consent Given: Yes Education handout provided: No Muscles  treated: BIL UT, BIL subscapularis, BIL C4-C5 cervical multifidi, BIL suboccipitals, lumbar paraspinals (estim) Electrical stimulation performed: YES Parameters: 5 min low frequency - milli amps - low intensity; 5 min - micro amps - high frequency high intensity. Treatment response/outcome: Improved muscle extensibility  [facility] Adult PT Treatment:                                                DATE: 08/29/2022 Therapeutic Exercise: Seated levator scap stretch x157m BIL Seated low rows with 40# cable 2x10 Seated high rows with 40# cable 2x10 Seated lat pull-downs with 40# cable 2x10 Seated shoulder rolls with chin tuck 2x10 forward and backward Standing BIL shoulder scaption with 3# dumbbells and with chin tuck isometric into ball at wall 3x10 Manual Therapy: Skilled palpation to identify trigger points prior to TPDN Effleurage/ STM to all musculature following TPDN   Trigger Point Dry-Needling  Treatment instructions: Expect mild to moderate muscle soreness. S/S of pneumothorax if dry needled over a lung field, and to seek immediate medical attention should they occur. Patient verbalized understanding of these instructions and education.  Patient Consent Given: Yes Education handout provided: No Muscles treated: BIL UT, BIL subscapularis, BIL C4-C5 cervical multifidi,  BIL suboccipitals, lumbar paraspinals (estim) Electrical stimulation performed: YES Parameters: 5 min low frequency - milli amps - low intensity; 5 min - micro amps - high frequency high intensity. Treatment response/outcome: Improved muscle extensibility Neuromuscular re-ed: N/A Therapeutic Activity: N/A Modalities: N/A Self Care: N/A      ASSESSMENT: CLINICAL IMPRESSION: Rubi tolerated session well with no adverse reaction.  She continues to respond well to TDN in the short term, but this seems minimally effective in the medium/long term.  Will reassess goals next visit but will likely D/C.    OBJECTIVE  IMPAIRMENTS decreased activity tolerance, decreased coordination, decreased mobility, decreased ROM, decreased strength, hypomobility, increased muscle spasms, impaired flexibility, impaired UE functional use, postural dysfunction, and pain.    ACTIVITY LIMITATIONS carrying, reach over head, and locomotion level   PARTICIPATION LIMITATIONS: meal prep, community activity, and occupation   [employer] are also affecting patient's functional outcome.      GOALS: Goals reviewed with patient? Yes   SHORT TERM GOALS: Target date: 07/05/2022    Pt will be indep with initial HEP to assist with pain management Baseline: initiated at eval Goal status: MET   2.  Pt will report 25% improvement in L shoulder symptoms with functional activities at work Baseline: 100% present 7/26: 25% improvement Goal status: MET     LONG TERM GOALS: Target date:  09/06/22 (extended to 10/24/2022)   Pt will be able to wipe down the scales with a circular motion at work without pain Baseline: 8/10; 07/26/22: 6/10 Goal status: ONGOING   2.  Pt will be able to perform left lateral cervical sidebend at work without pain Baseline: 10/10;  09/19/2022: 8/10 10/20: 6/10 Goal status: ONGOING   3.  Pt will be able to mop/sweep without pain Baseline: 6/10; 07/26/22: 7-8/10 09/19/2022: 8/10 10/20: 6/10 Goal status: ONGOING   4.  Pt will have improved FOTO score to >/=57% function Baseline: 40% function 07/03/2022: 32% 08/16/2022: 44% 10/20: 42% Goal status: ONGOING   5.  Pt will demo improvement in L shoulder strength to >/= 4+/5 to assist with work duties Baseline: 3+/5 throughout except mid trap/teres 2-/5 07/03/2022: 4+/5 Goal status: ACHIEVED     PLAN: PT FREQUENCY: 2x/week   PT DURATION: other: 6 weeks (extended to 11/17)   PLANNED INTERVENTIONS: Therapeutic exercises, Therapeutic activity, Neuromuscular re-education, Patient/Family education, Joint mobilization, Dry Needling, Moist heat,  Taping, Ultrasound, Manual therapy, and Re-evaluation   PLAN FOR NEXT SESSION:  L clavicular/scapular mobs, manual therapy for L Upper Trap/Teres/Mid trap tightness; strengthening L shoulder, dry needling   Kevan Ny Kelyn Koskela PT 10/24/22 11:33 AM

## 2022-10-24 NOTE — Patient Instructions (Addendum)
Good to see you  Will prescribe osteoporosis medication but do not start until you 52 th birthday  4 week follow up

## 2022-10-27 ENCOUNTER — Ambulatory Visit (HOSPITAL_COMMUNITY)
Admission: EM | Admit: 2022-10-27 | Discharge: 2022-10-27 | Disposition: A | Discharge status: Home / Self Care | Payer: 59 | Attending: Family Medicine | Admitting: Family Medicine

## 2022-10-27 ENCOUNTER — Other Ambulatory Visit (HOSPITAL_COMMUNITY): Payer: Self-pay

## 2022-10-27 ENCOUNTER — Other Ambulatory Visit: Payer: Self-pay

## 2022-10-27 ENCOUNTER — Encounter (HOSPITAL_COMMUNITY): Payer: Self-pay | Admitting: Emergency Medicine

## 2022-10-27 DIAGNOSIS — N898 Other specified noninflammatory disorders of vagina: Secondary | ICD-10-CM | POA: Insufficient documentation

## 2022-10-27 DIAGNOSIS — N3090 Cystitis, unspecified without hematuria: Secondary | ICD-10-CM | POA: Insufficient documentation

## 2022-10-27 LAB — POCT URINALYSIS DIPSTICK, ED / UC
Bilirubin Urine: NEGATIVE
Glucose, UA: NEGATIVE mg/dL
Ketones, ur: NEGATIVE mg/dL
Nitrite: POSITIVE — AB
Protein, ur: 100 mg/dL — AB
Specific Gravity, Urine: >=1.03 (ref 1.005–1.030)
Urobilinogen, UA: 0.2 mg/dL (ref 0.0–1.0)
pH: 5.5 (ref 5.0–8.0)

## 2022-10-27 MED ORDER — CEPHALEXIN 500 MG PO CAPS
500.0000 mg | ORAL_CAPSULE | Freq: Two times a day (BID) | ORAL | 0 refills | Status: DC
  Filled 2022-10-27: qty 10, 5d supply, fill #0

## 2022-10-27 MED ORDER — TINIDAZOLE 500 MG PO TABS
1.0000 g | ORAL_TABLET | Freq: Every day | ORAL | 0 refills | Status: DC
  Filled 2022-10-27: qty 10, 5d supply, fill #0

## 2022-10-27 NOTE — Discharge Instructions (Signed)
We have sent testing for various causes of vaginal infections. We will notify you of any positive results once they are received. If required, we will prescribe any medications you might need.  Please refrain from all sexual activity for at least the next seven days.  

## 2022-10-27 NOTE — ED Triage Notes (Signed)
Reports having uti symptoms for 4 days.  Patient is also concerned for BV.  Patient has urgency, frequency and right flank pain.    Patient has had ibuprofen only

## 2022-10-28 ENCOUNTER — Other Ambulatory Visit (HOSPITAL_COMMUNITY): Payer: Self-pay | Admitting: Family Medicine

## 2022-10-28 LAB — CERVICOVAGINAL ANCILLARY ONLY
Bacterial Vaginitis (gardnerella): NEGATIVE
Candida Glabrata: NEGATIVE
Candida Vaginitis: NEGATIVE
Chlamydia: NEGATIVE
Comment: NEGATIVE
Comment: NEGATIVE
Comment: NEGATIVE
Comment: NEGATIVE
Comment: NEGATIVE
Comment: NORMAL
Neisseria Gonorrhea: NEGATIVE
Trichomonas: NEGATIVE

## 2022-10-29 LAB — URINE CULTURE: Culture: >=100000 — AB

## 2022-10-29 NOTE — ED Provider Notes (Signed)
[place]   [id number] 10/27/22 Arrival Time: 4332  ASSESSMENT & PLAN:  1. Cystitis   2. Vaginal discharge    Meds ordered this encounter  Medications   tinidazole (TINDAMAX) 500 MG tablet    Sig: Take 2 tablets (1,000 mg total) by mouth daily with breakfast for 5 days.    Dispense:  10 tablet    Refill:  0    per secure chat w/ Dr 10/27/22 can use th3 500 mg tabs   cephALEXin (KEFLEX) 500 MG capsule    Sig: Take 1 capsule (500 mg total) by mouth 2 (two) times daily.    Dispense:  10 capsule    Refill:  0   Will tx empirically for BV and UTI.    Discharge Instructions      We have sent testing for various causes of vaginal infections. We will notify you of any positive results once they are received. If required, we will prescribe any medications you might need.  Please refrain from all sexual activity for at least the next seven days.     Without s/s of PID.  Labs Reviewed  URINE CULTURE  POCT URINALYSIS DIPSTICK, ED / UC - Abnormal; Notable for the following components:   Hgb urine dipstick LARGE (*)    Protein, ur 100 (*)    Nitrite POSITIVE (*)    Leukocytes,Ua LARGE (*)    All other components within normal limits  CERVICOVAGINAL ANCILLARY ONLY     Will notify of any positive results. Instructed to refrain from sexual activity for at least seven days.  Reviewed expectations re: course of current medical issues. Questions answered. Outlined signs and symptoms indicating need for more acute intervention. Patient verbalized understanding. After Visit Summary given.   SUBJECTIVE:  Donna Park is a 51 y.o. female who Reports having uti symptoms for 4 days; dysuria and frequency. Patient is also concerned for BV.  Patient has urgency, frequency and right flank pain.    Patient has had ibuprofen only. Afebrile. No abd pain.   No LMP recorded. Patient has had a hysterectomy.   OBJECTIVE:  Vitals:   10/27/22 1038  BP: 137/89   Pulse: 74  Resp: 20  Temp: 98.2 F (36.8 C)  TempSrc: Oral  SpO2: 96%     General appearance: alert, cooperative, appears stated age and no distress Lungs: unlabored respirations; speaks full sentences without difficulty Back: no CVA tenderness; FROM at waist Abdomen: soft, non-tender GU: deferred Skin: warm and dry Psychological: alert and cooperative; normal mood and affect.    Labs Reviewed  URINE CULTURE - Abnormal; Notable for the following components:      Result Value   Culture >=100,000 COLONIES/mL ESCHERICHIA COLI (*)    Organism ID, Bacteria ESCHERICHIA COLI (*)    All other components within normal limits  POCT URINALYSIS DIPSTICK, ED / UC - Abnormal; Notable for the following components:   Hgb urine dipstick LARGE (*)    Protein, ur 100 (*)    Nitrite POSITIVE (*)    Leukocytes,Ua LARGE (*)    All other components within normal limits  CERVICOVAGINAL ANCILLARY ONLY    Allergies  Allergen Reactions   Cyclobenzaprine Shortness Of Breath and Other (See Comments)    Relaxed tongue    Shrimp Flavor Anaphylaxis   Sulfa Antibiotics Anaphylaxis, Hives and Swelling   Carisoprodol Hives   Metronidazole     not allergic; drug doesn't work for pt   Morphine Other (See Comments)  Feels like her head is going to "blow up"     Topiramate     Brain fog   Adhesive [Tape] Rash    Other reaction(s): SKIN - rash (skin burns) PAPER TAPE IS OK    Tramadol Itching, Nausea And Vomiting and Rash    Past Medical History:  Diagnosis Date   Allergy    Anxiety    Arthritis    Asthma    Chicken pox    Complication of anesthesia    slow to wake up from anesthesia   Depression    DJD (degenerative joint disease)    GERD (gastroesophageal reflux disease)    resolved after gastric surgery   H/O gastric bypass 2016   History of iron deficiency anemia    HSV infection    Migraines    Obese    Pneumonia    Childhood history   PONV (postoperative nausea and  vomiting)    Status post dilation of esophageal narrowing    Varicose vein of leg    Family History  Problem Relation Age of Onset   COPD Mother    Heart disease Mother    Alcohol abuse Mother    Depression Mother    Drug abuse Mother    High Cholesterol Mother    High blood pressure Mother    COPD Father    Aneurysm Father    Heart disease Father    Early death Father    Diabetes Father    Drug abuse Father    AAA (abdominal aortic aneurysm) Father        cause of death at 60   Arthritis Sister    Kidney disease Sister    Crohn's disease Sister    Ulcerative colitis Sister    Thyroid disease Sister    Kidney disease Brother    Kidney disease Maternal Aunt    Diabetes Paternal Aunt    Asthma Neg Hx    Allergic rhinitis Neg Hx    Colon cancer Neg Hx    Rectal cancer Neg Hx    Stomach cancer Neg Hx    Social History   Socioeconomic History   Marital status: Married    Spouse name: Not on file   Number of children: 4   Years of education: Not on file   Highest education level: Associate degree: academic program  Occupational History   Not on file  Tobacco Use   Smoking status: Never   Smokeless tobacco: Never  Vaping Use   Vaping Use: Never used  Substance and Sexual Activity   Alcohol use: Yes    Comment: Social/Rare   Drug use: Never   Sexual activity: Yes    Birth control/protection: Surgical  Other Topics Concern   Not on file  Social History Narrative   Not on file   Social Determinants of Health   Financial Resource Strain: Medium Risk (01/28/2022)   Overall Financial Resource Strain (CARDIA)    Difficulty of Paying Living Expenses: Somewhat hard  Food Insecurity: No Food Insecurity (01/28/2022)   Hunger Vital Sign    Worried About Running Out of Food in the Last Year: Never true    Ran Out of Food in the Last Year: Never true  Transportation Needs: No Transportation Needs (01/28/2022)   PRAPARE - Hydrologist  (Medical): No    Lack of Transportation (Non-Medical): No  Physical Activity: Sufficiently Active (01/28/2022)   Exercise Vital Sign    Days  of Exercise per Week: 5 days    Minutes of Exercise per Session: 150+ min  Stress: Stress Concern Present (01/28/2022)   [place]    Feeling of Stress : Very much  Social Connections: Moderately Integrated (01/28/2022)   Social Connection and Isolation Panel [NHANES]    Frequency of Communication with Friends and Family: More than three times a week    Frequency of Social Gatherings with Friends and Family: Never    Attends Religious Services: More than 4 times per year    Active Member of [employer] or Organizations: Yes    Attends Archivist Meetings: More than 4 times per year    Marital Status: Separated  Intimate Partner Violence: Not on file           [place], MD 10/29/22 [phone]

## 2022-10-31 ENCOUNTER — Other Ambulatory Visit (HOSPITAL_COMMUNITY): Payer: Self-pay | Admitting: Family Medicine

## 2022-10-31 ENCOUNTER — Other Ambulatory Visit (HOSPITAL_COMMUNITY): Payer: Self-pay

## 2022-11-04 ENCOUNTER — Ambulatory Visit (INDEPENDENT_AMBULATORY_CARE_PROVIDER_SITE_OTHER): Payer: 59 | Admitting: Neurology

## 2022-11-04 ENCOUNTER — Other Ambulatory Visit (HOSPITAL_COMMUNITY): Payer: Self-pay

## 2022-11-04 DIAGNOSIS — G43709 Chronic migraine without aura, not intractable, without status migrainosus: Secondary | ICD-10-CM | POA: Diagnosis not present

## 2022-11-04 MED ORDER — ONABOTULINUMTOXINA 200 UNITS IJ SOLR
155.0000 [IU] | Freq: Once | INTRAMUSCULAR | Status: AC
  Administered 2022-11-04: 155 [IU] via INTRAMUSCULAR

## 2022-11-04 MED ORDER — TINIDAZOLE 500 MG PO TABS
1.0000 g | ORAL_TABLET | Freq: Every day | ORAL | 0 refills | Status: AC
  Filled 2022-11-04: qty 10, 5d supply, fill #0

## 2022-11-04 NOTE — Progress Notes (Signed)
Consent Form Botulism Toxin Injection For Chronic Migraine  11/04/2022: stable, we place 10units in each masseter and do not put any in the cervical paraspinals 08/04/2022: stable  04/18/2022; last seen by Dr Jaynee Eagles in 10/2021 and started on Botox therapy. She had second procedure 01/14/2022. She reports significant improvement in migraines. She feels migraines are 85-90% improved. Baseline: more than 25 headache days a month with greater than 10 being moderate to severe migraines. SHE LOST 170 POUNDS AFTER GASTRIC BYPASS 7 YEARS AGO. She works at [employer] with Dr. Leafy Ro.   She now only has 4 migraine days a month and <10 total headache days a month. Failed sumatriptan, rizatriptan. Prescrive nurtec.  No orders of the defined types were placed in this encounter.   Reviewed orally with patient, additionally signature is on file:  Botulism toxin has been approved by the Federal drug administration for treatment of chronic migraine. Botulism toxin does not cure chronic migraine and it may not be effective in some patients.  The administration of botulism toxin is accomplished by injecting a small amount of toxin into the muscles of the neck and head. Dosage must be titrated for each individual. Any benefits resulting from botulism toxin tend to wear off after 3 months with a repeat injection required if benefit is to be maintained. Injections are usually done every 3-4 months with maximum effect peak achieved by about 2 or 3 weeks. Botulism toxin is expensive and you should be sure of what costs you will incur resulting from the injection.  The side effects of botulism toxin use for chronic migraine may include:   -Transient, and usually mild, facial weakness with facial injections  -Transient, and usually mild, head or neck weakness with head/neck injections  -Reduction or loss of forehead facial animation due to forehead muscle weakness  -Eyelid drooping  -Dry eye  -Pain at the site of injection or  bruising at the site of injection  -Double vision  -Potential unknown long term risks  Contraindications: You should not have Botox if you are pregnant, nursing, allergic to albumin, have an infection, skin condition, or muscle weakness at the site of the injection, or have myasthenia gravis, Lambert-Eaton syndrome, or ALS.  It is also possible that as with any injection, there may be an allergic reaction or no effect from the medication. Reduced effectiveness after repeated injections is sometimes seen and rarely infection at the injection site may occur. All care will be taken to prevent these side effects. If therapy is given over a long time, atrophy and wasting in the muscle injected may occur. Occasionally the patient's become refractory to treatment because they develop antibodies to the toxin. In this event, therapy needs to be modified.  I have read the above information and consent to the administration of botulism toxin.    BOTOX PROCEDURE NOTE FOR MIGRAINE HEADACHE    Contraindications and precautions discussed with patient(above). Aseptic procedure was observed and patient tolerated procedure. Procedure performed by Dr. Georgia Dom  The condition has existed for more than 6 months, and pt does not have a diagnosis of ALS, Myasthenia Gravis or Lambert-Eaton Syndrome.  Risks and benefits of injections discussed and pt agrees to proceed with the procedure.  Written consent obtained  These injections are medically necessary. Pt  receives good benefits from these injections. These injections do not cause sedations or hallucinations which the oral therapies may cause.  Description of procedure:  The patient was placed in a sitting position. The standard protocol  was used for Botox as follows, with 5 units of Botox injected at each site:   -Procerus muscle, midline injection  -Corrugator muscle, bilateral injection  -Frontalis muscle, bilateral injection, with 2 sites each side,  medial injection was performed in the upper one third of the frontalis muscle, in the region vertical from the medial inferior edge of the superior orbital rim. The lateral injection was again in the upper one third of the forehead vertically above the lateral limbus of the cornea, 1.5 cm lateral to the medial injection site.  -Temporalis muscle injection, 4 sites, bilaterally. The first injection was 3 cm above the tragus of the ear, second injection site was 1.5 cm to 3 cm up from the first injection site in line with the tragus of the ear. The third injection site was 1.5-3 cm forward between the first 2 injection sites. The fourth injection site was 1.5 cm posterior to the second injection site.   -Occipitalis muscle injection, 3 sites, bilaterally. The first injection was done one half way between the occipital protuberance and the tip of the mastoid process behind the ear. The second injection site was done lateral and superior to the first, 1 fingerbreadth from the first injection. The third injection site was 1 fingerbreadth superiorly and medially from the first injection site.  -Trapezius muscle injection was performed at 3 sites, bilaterally. The first injection site was in the upper trapezius muscle halfway between the inflection point of the neck, and the acromion. The second injection site was one half way between the acromion and the first injection site. The third injection was done between the first injection site and the inflection point of the neck.   Will return for repeat injection in 3 months.   200 units of Botox was used, 45 U Botox not injected was wasted. The patient tolerated the procedure well, there were no complications of the above procedure.

## 2022-11-04 NOTE — Progress Notes (Signed)
Botox- 200 units x 1 vial Lot: [id number] Expiration: 03/2025 NDC: 2552-5894-83  Bacteriostatic 0.9% Sodium Chloride- 78m total Lot: [id number]: 08/09/2023 NDC: [address]-47 Dx: [id number]/B

## 2022-11-05 ENCOUNTER — Other Ambulatory Visit (HOSPITAL_COMMUNITY): Payer: Self-pay

## 2022-11-06 ENCOUNTER — Other Ambulatory Visit (HOSPITAL_COMMUNITY): Payer: Self-pay

## 2022-11-10 ENCOUNTER — Other Ambulatory Visit (HOSPITAL_COMMUNITY): Payer: Self-pay

## 2022-11-11 ENCOUNTER — Other Ambulatory Visit (HOSPITAL_COMMUNITY): Payer: Self-pay

## 2022-11-13 ENCOUNTER — Ambulatory Visit (INDEPENDENT_AMBULATORY_CARE_PROVIDER_SITE_OTHER): Payer: 59 | Admitting: Allergy & Immunology

## 2022-11-13 ENCOUNTER — Other Ambulatory Visit (HOSPITAL_COMMUNITY): Payer: Self-pay

## 2022-11-13 ENCOUNTER — Other Ambulatory Visit: Payer: Self-pay

## 2022-11-13 ENCOUNTER — Encounter: Payer: Self-pay | Admitting: Allergy & Immunology

## 2022-11-13 VITALS — BP 124/78 | HR 80 | Resp 16 | Wt 172.7 lb

## 2022-11-13 DIAGNOSIS — J3089 Other allergic rhinitis: Secondary | ICD-10-CM | POA: Diagnosis not present

## 2022-11-13 DIAGNOSIS — J4550 Severe persistent asthma, uncomplicated: Secondary | ICD-10-CM | POA: Diagnosis not present

## 2022-11-13 DIAGNOSIS — T50905D Adverse effect of unspecified drugs, medicaments and biological substances, subsequent encounter: Secondary | ICD-10-CM

## 2022-11-13 DIAGNOSIS — T7800XD Anaphylactic reaction due to unspecified food, subsequent encounter: Secondary | ICD-10-CM

## 2022-11-13 DIAGNOSIS — Z889 Allergy status to unspecified drugs, medicaments and biological substances status: Secondary | ICD-10-CM

## 2022-11-13 MED ORDER — ALBUTEROL SULFATE HFA 108 (90 BASE) MCG/ACT IN AERS
1.0000 | INHALATION_SPRAY | Freq: Four times a day (QID) | RESPIRATORY_TRACT | 1 refills | Status: DC | PRN
  Filled 2022-11-13: qty 18, 68d supply, fill #0
  Filled 2023-05-08: qty 6.7, 25d supply, fill #0
  Filled 2023-06-25: qty 6.7, 25d supply, fill #1

## 2022-11-13 MED ORDER — FLUTICASONE PROPIONATE 50 MCG/ACT NA SUSP
2.0000 | Freq: Every day | NASAL | 5 refills | Status: DC
  Filled 2022-11-13: qty 16, 30d supply, fill #0

## 2022-11-13 MED ORDER — FLUCONAZOLE 150 MG PO TABS
150.0000 mg | ORAL_TABLET | Freq: Every day | ORAL | 5 refills | Status: DC
  Filled 2022-11-13: qty 14, 14d supply, fill #0
  Filled 2022-11-26: qty 14, 14d supply, fill #1
  Filled 2023-06-25: qty 14, 14d supply, fill #2
  Filled 2023-10-09: qty 14, 14d supply, fill #3

## 2022-11-13 MED ORDER — EPINEPHRINE 0.3 MG/0.3ML IJ SOAJ
0.3000 mg | INTRAMUSCULAR | 1 refills | Status: DC | PRN
  Filled 2022-11-13: qty 2, 30d supply, fill #0
  Filled 2022-11-26 – 2022-12-18 (×4): qty 2, 30d supply, fill #1

## 2022-11-13 MED ORDER — AZELASTINE-FLUTICASONE 137-50 MCG/ACT NA SUSP
2.0000 | Freq: Two times a day (BID) | NASAL | 5 refills | Status: DC | PRN
  Filled 2022-11-13 – 2023-06-25 (×2): qty 23, 30d supply, fill #0
  Filled 2023-10-09: qty 23, 30d supply, fill #1
  Filled 2023-11-12: qty 23, 30d supply, fill #2

## 2022-11-13 MED ORDER — CEFDINIR 300 MG PO CAPS
300.0000 mg | ORAL_CAPSULE | Freq: Two times a day (BID) | ORAL | 0 refills | Status: AC
  Filled 2022-11-13: qty 10, 5d supply, fill #0

## 2022-11-13 MED ORDER — AZELASTINE HCL 0.1 % NA SOLN
2.0000 | Freq: Two times a day (BID) | NASAL | 5 refills | Status: DC
  Filled 2022-11-13: qty 30, 25d supply, fill #0

## 2022-11-13 NOTE — Progress Notes (Signed)
FOLLOW UP  Date of Service/Encounter:  11/13/22   Assessment:   Moderate persistent asthma with acute exacerbation   Seasonal and perennial allergic rhinitis (grass, trees, molds, cat, dust mite, cockroach) - s/p five years of allergen immunotherapy)   Anaphylactic shock due to food (shrimp) - tolerates other shellfish   Itching   S/p hysterectomy (June 2021)   S/p gastric bypass (2016)  Plan/Recommendations:   1. Moderate persistent asthma, uncomplicated - Lung testing looked AWESOME. - We are not going to make any medication changes.  - Daily controller medication(s): Trelegy 200/62.5/25 one puff once daily and Tezspire Park four weeks. Will discuss with Dr. Ernst Bowler once he returns about whether he thinks that you should continue Tezspire due to itching and occasional hives - Prior to physical activity: albuterol 2 puffs 10-15 minutes before physical activity. - Rescue medications: albuterol 4 puffs Park 4-6 hours as needed - Changes during respiratory infections or worsening symptoms: ADD ON Arnuity 228mg to 1 puff twice daily for ONE TO TWO WEEKS. - Asthma control goals:  * Full participation in all desired activities (may need albuterol before activity) * Albuterol use two time or less a week on average (not counting use with activity) * Cough interfering with sleep two time or less a month * Oral steroids no more than once a year * No hospitalizations  2. Seasonal and perennial allergic rhinitis - Continue with cetirizine '20mg'$  twice daily. - Continue ipratropium bromide nasal spray 2 sprays each nostril twice a day as needed. Do not use on the same day that you use Dymista - Continue with Dymista one sprays per nostril up to twice daily. - Continue with nasal saline rinses.   3. Anaphylactic shock due to food - Avoid shrimp. - EpiPen is up to date.   4. Keflex allergy - We are changing you to cefdinir (third generation cephalosporin) to complete a week of  treatment ofr the UTI. - I would like to get you in for a Keflex challenge at some point to clarify this. - I would also like to get you in for a Bactrim challenge.  5. Return in about 1 year (around 11/14/2023).   Subjective:   Donna Park a 51y.o. female presenting today for follow up of  Chief Complaint  Patient presents with   Asthma    Been doing fine. Little SOB but it goes away. Used inhaler maybe two to three times in one month.    Follow-up   Allergic Reaction    Keflex allergic reaction: lips burned, tongue big round circles/ patches.     Donna Park a history of the following: Patient Active Problem List   Diagnosis Date Noted   Dysphagia    LUQ pain 03/01/2022   Change in bowel habits 03/01/2022   Loss of weight 03/01/2022   Chronic migraine without aura without status migrainosus, not intractable 06/14/2021   S/P laparoscopic assisted vaginal hysterectomy (LAVH) 05/15/2020   Migraines    GERD (gastroesophageal reflux disease)    Asthma    Seasonal and perennial allergic rhinitis 02/10/2019   Moderate persistent asthma, uncomplicated 068/34/1960  Anaphylactic shock due to adverse food reaction 02/10/2019   Atopic dermatitis 02/10/2019   History of Roux-en-Y gastric bypass 2016    History obtained from: chart review and patient.  [name] a 51y.o. female presenting for a follow up visit. She was last seen in December 2022. At that time, her lung testing looked  awesome, but she was given a Depo Medrol injection and completed a prednisone course due to SOB/wheezing. We continued with the Trelegy once daily as well as Tezspire Park 4 weeks. We also continued with albuterol as needed. For her rhinitis, we continued with cetirizine as well as ipratropium as needed and Dymista. She continued to avoid shrimp.   In the interim, she has done very well.   Asthma/Respiratory Symptom History: She has been doing fine. Donna Park has been doing well. She  missed her dose last month and got busy. Donna Park asthma has been well controlled. She has not required rescue medication, experienced nocturnal awakenings due to lower respiratory symptoms, nor have activities of daily living been limited. She has required no Emergency Department or Urgent Care visits for her asthma. She has required zero courses of systemic steroids for asthma exacerbations since the last visit. ACT score today is 25, indicating excellent asthma symptom control. She has not needed prednisone or ED visits for her asthma since the last visit.   Allergic Rhinitis Symptom History: Rhinitis symptoms are controlled with the use of the nasal sprays and cetirizine. She has not been on antibiotics at all since the last visit. She is doing generally well overall.   She takes Diflucan twice monthly for oral thrush secondary to her inhaler. She had this issues with Advair as well. She has tried to keep things down.   Food Allergy Symptom History: She continues to avoid shrimp. She has not had any of it at all. She does have an up to date EpiPen.  She did have reaction to Keflex recently. She was being treated for a UTI. She had E coli that was pan-susceptible. She is unsure whrther she has had Keflex in the past. She has tolerated clindamycin. She has a sulfa alleryg from when she was little. Reaction unknown.   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 She is also doing PRN Friday work.   Her Mom is doing good. Donna Park has been better. They went to [facility] recently. They have also traveled to [place].   Otherwise, there have been no changes to her past medical history, surgical history, family history, or social history.    Review of Systems  Constitutional: Negative.  Negative for fever, malaise/fatigue and weight loss.  HENT: Negative.  Negative for congestion, ear discharge and ear pain.   Eyes:  Negative for pain, discharge and redness.   Respiratory:  Negative for cough, sputum production, shortness of breath and wheezing.   Cardiovascular: Negative.  Negative for chest pain and palpitations.  Gastrointestinal:  Negative for abdominal pain, constipation, diarrhea, heartburn, nausea and vomiting.  Skin: Negative.  Negative for itching and rash.  Neurological:  Negative for dizziness and headaches.  Endo/Heme/Allergies:  Negative for environmental allergies. Does not bruise/bleed easily.       Objective:   Blood pressure 124/78, pulse 80, resp. rate 16, weight 172 lb 11.2 oz (78.3 kg), SpO2 100 %. Body mass index is 27.87 kg/m.    Physical Exam Vitals reviewed.  Constitutional:      Appearance: She is well-developed.     Comments: Very pleasant female.  Talkative. Personable.   HENT:     Head: Normocephalic and atraumatic.     Right Ear: Tympanic membrane, ear canal and external ear normal.     Left Ear: Tympanic membrane, ear canal and external ear normal.     Nose: No nasal deformity, septal deviation, mucosal  edema or rhinorrhea.     Right Turbinates: Enlarged, swollen and pale.     Left Turbinates: Enlarged, swollen and pale.     Right Sinus: No maxillary sinus tenderness or frontal sinus tenderness.     Left Sinus: No maxillary sinus tenderness or frontal sinus tenderness.     Mouth/Throat:     Mouth: Mucous membranes are not pale and not dry.     Pharynx: Uvula midline.  Eyes:     General: Lids are normal. No allergic shiner.       Right eye: No discharge.        Left eye: No discharge.     Conjunctiva/sclera: Conjunctivae normal.     Right eye: Right conjunctiva is not injected. No chemosis.    Left eye: Left conjunctiva is not injected. No chemosis.    Pupils: Pupils are equal, round, and reactive to light.  Cardiovascular:     Rate and Rhythm: Normal rate and regular rhythm.     Heart sounds: Normal heart sounds.  Pulmonary:     Effort: Pulmonary effort is normal. No tachypnea, accessory muscle  usage or respiratory distress.     Breath sounds: Normal breath sounds. No wheezing, rhonchi or rales.     Comments: Moving air well in all lung fields.  No increased work of breathing. Chest:     Chest wall: No tenderness.  Lymphadenopathy:     Cervical: No cervical adenopathy.  Skin:    Coloration: Skin is not pale.     Findings: No abrasion, erythema, petechiae or rash. Rash is not papular, urticarial or vesicular.  Neurological:     Mental Status: She is alert.  Psychiatric:        Behavior: Behavior is cooperative.      Diagnostic studies:    Spirometry: results normal (FEV1: 1.87/73%, FVC: 2.27/72%, FEV1/FVC: 82%).    Spirometry consistent with normal pattern.   Allergy Studies: none        Salvatore Marvel, MD  [facility]

## 2022-11-13 NOTE — Patient Instructions (Addendum)
1. Moderate persistent asthma, uncomplicated - Lung testing looked AWESOME. - We are not going to make any medication changes.  - Daily controller medication(s): Trelegy 200/62.5/25 one puff once daily and Tezspire every four weeks. Will discuss with Dr. Ernst Bowler once he returns about whether he thinks that you should continue Tezspire due to itching and occasional hives - Prior to physical activity: albuterol 2 puffs 10-15 minutes before physical activity. - Rescue medications: albuterol 4 puffs every 4-6 hours as needed - Changes during respiratory infections or worsening symptoms: ADD ON Arnuity 246mg to 1 puff twice daily for ONE TO TWO WEEKS. - Asthma control goals:  * Full participation in all desired activities (may need albuterol before activity) * Albuterol use two time or less a week on average (not counting use with activity) * Cough interfering with sleep two time or less a month * Oral steroids no more than once a year * No hospitalizations  2. Seasonal and perennial allergic rhinitis - Continue with cetirizine '20mg'$  twice daily. - Continue ipratropium bromide nasal spray 2 sprays each nostril twice a day as needed. Do not use on the same day that you use Dymista - Continue with Dymista one sprays per nostril up to twice daily. - Continue with nasal saline rinses.   3. Anaphylactic shock due to food - Avoid shrimp. - EpiPen is up to date.   4. Keflex allergy - We are changing you to cefdinir (third generation cephalosporin) to complete a week of treatment ofr the UTI. - I would like to get you in for a Keflex challenge at some point to clarify this. - I would also like to get you in for a Bactrim challenge.  5. Return in about 1 year (around 11/14/2023).    Please inform [place] any Emergency Department visits, hospitalizations, or changes in symptoms. Call [place]efore going to the ED for breathing or allergy symptoms since we might be able to fit you in for a sick visit.  Feel free to contact u[place]anytime with any questions, problems, or concerns.  It was a pleasure to see you again today!  Websites that have reliable patient information: 1. American Academy of Asthma, Allergy, and Immunology: www.aaaai.org 2. Food Allergy Research and Education (FARE): foodallergy.org 3. Mothers of Asthmatics: http://www.asthmacommunitynetwork.org 4. American College of Allergy, Asthma, and Immunology: www.acaai.org   COVID-19 Vaccine Information can be found at: [email or web address] questions related to vaccine distribution or appointments, please email [email or web address] or call [phone]   We realize that you might be concerned about having an allergic reaction to the COVID19 vaccines. To help with that concern, WE ARE OFFERING THE COVID19 VACCINES IN OUR OFFICE! Ask the front desk for dates!     "Like" [place] Facebook and Instagram for our latest updates!      A healthy democracy works best when ANew [employer] Life Insuranceparticipate! Make sure you are registered to vote! If you have moved or changed any of your contact information, you will need to get this updated before voting!  In some cases, you MAY be able to register to vote online: [email or web address]

## 2022-11-14 ENCOUNTER — Other Ambulatory Visit (HOSPITAL_COMMUNITY): Payer: Self-pay

## 2022-11-17 ENCOUNTER — Other Ambulatory Visit (HOSPITAL_COMMUNITY): Payer: Self-pay

## 2022-11-20 NOTE — Progress Notes (Signed)
Donna Park D.[place] Donna Park Phone: [phone]   Assessment and Plan:     1. Neck pain 2. Chronic bilateral low back pain with right-sided sciatica 3. Somatic dysfunction of cervical region 4. Somatic dysfunction of thoracic region 5. Somatic dysfunction of lumbar region 6. Somatic dysfunction of pelvic region 7. Somatic dysfunction of rib region -Chronic with exacerbation, subsequent sports medicine visit - Continued flares of multiple musculoskeletal regions with most prominent being neck, bilateral trapezius, low back.  Patient did get moderate relief in neck and trapezius pain after trigger point injections at previous office visit - Patient has received significant relief with OMT in the past.  Elects for repeat OMT today.  Tolerated well per note below. - Decision today to treat with OMT was based on Physical Exam  After verbal consent patient was treated with HVLA (high velocity low amplitude), ME (muscle energy), FPR (flex positional release), ST (soft tissue), PC/PD (Pelvic Compression/ Pelvic Decompression) techniques in cervical, rib, thoracic, lumbar, and pelvic areas. Patient tolerated the procedure well with improvement in symptoms.  Patient educated on potential side effects of soreness and recommended to rest, hydrate, and use Tylenol as needed for pain control.   8. Osteoporosis without current pathological fracture, unspecified osteoporosis type   -Chronic - Patient with T score -2.9 on DEXA scan from 08/12/2022 indicating osteoporosis - Patient has been taking vitamin D 50,000 units and calcium with calcium and vitamin D levels currently WNL after checking on office visit on 09/26/2022 - Patient has been following with GI and has a history of esophageal narrowing with dilation procedure.  Due to alendronate potential GI side effects, we will wait for full results from GI before starting medication -  If GI provides clearance, patient could start alendronate 70 mg weekly after her 52th birthday on 01/25/2021  Pertinent previous records reviewed include none   Follow Up: 4 weeks for reevaluation   Subjective:   I, Donna Park, am serving as a Education administrator for Donna Park   Chief Complaint: left shoulder pain and tightness   HPI:  01/31/2022 Patient is a 51 year old female complaining of neck and shoulder pain. Patient states been going on for about 2 weeks has knots in her shoulders both neck and shoulders have been stiff , interested in OMT , knots in her upper trap , low back and her sciatic nerve on her right side flares up when she's having intercourse goes down her leg and upper her back    02/21/2022 Patient states that her neck and her shoulders are still bothering her after her adjustment she was very tight is still tight in the upper back , still wants the adjustment will get a massage later tonight as well  , hip still hurts , her tailbone she woke up one morning with pain     03/06/2022 Patient states still has upper trap tightness, finder her self leaning on that left side    03/28/2022 Patient states that she is still tight time for an adjustment would like to discuss a lower spot for CSI    04/25/2022 Patient states that her neck is heavy, would like a referral for MRI, still neck tightness and pain,   05/23/2022 Patient states she is ready for the OMT but the left shoulder clavicle have been sore, neck and shoulder still hurts. Patient has had the MRI of the ankle this morning. Patient wanting to know if  she can get another shoulder injection today    06/20/2022 Patient states that she is a little better decreased ROM but is going to PT , ankle feels better bought different shoes    08/08/2022  Patient states shoulder still hurts has been dry needling and want to talk talk about lidocaine again and then low back pain want referral for PT and sciatic pain     08/29/2022 Patient states wants OMT, knot still hurts    09/26/2022 Patient states that her shoulder still hurts , last week her bones were hurting , body is achy and painful    10/24/2022 Patient states that the bones and the back of her neck are getting tight and painful same spot as when she came in the first time, also the upper trap knot coming back    11/21/2022 Patient states neck and shoulder are tight , stopped PT , needs  tune up   Relevant Historical Information: History of migraines being treated with Botox injections  Additional pertinent review of systems negative.   Current Outpatient Medications:    acetaminophen (TYLENOL) 500 MG tablet, Take by mouth., Disp: , Rfl:    acyclovir ointment (ZOVIRAX) 5 %, Apply onto the affected area every 3 hours., Disp: 15 g, Rfl: 1   albuterol (VENTOLIN HFA) 108 (90 Base) MCG/ACT inhaler, Inhale 1 - 2 puffs into the lungs every 6 hours as needed for wheezing or shortness of breath., Disp: 6.7 g, Rfl: 1   [START ON 01/25/2023] alendronate (FOSAMAX) 70 MG tablet, Take 1 tablet (70 mg total) by mouth every 7 (seven) days., Disp: 13 tablet, Rfl: 0   amitriptyline (ELAVIL) 10 MG tablet, Take 1 tablet (10 mg total) by mouth at bedtime., Disp: 90 tablet, Rfl: 1   azelastine (ASTELIN) 0.1 % nasal spray, Place 2 sprays into both nostrils 2 (two) times daily., Disp: 30 mL, Rfl: 5   Azelastine-Fluticasone (DYMISTA) 137-50 MCG/ACT SUSP, Place 2 sprays into both nostrils 2 (two) times daily as needed., Disp: 23 g, Rfl: 5   CALCIUM PO, Take 1 tablet by mouth 4 (four) times a week., Disp: , Rfl:    cefdinir (OMNICEF) 300 MG capsule, Take 1 capsule (300 mg total) by mouth 2 (two) times daily for 5 days., Disp: 10 capsule, Rfl: 0   cetirizine (ZYRTEC) 10 MG tablet, Take 2 tablets (20 mg total) by mouth 2 (two) times daily., Disp: 360 tablet, Rfl: 1   Cyanocobalamin (NASCOBAL NA), Place 1 spray into the nose every Sunday., Disp: , Rfl:    diazepam (VALIUM)  10 MG tablet, Take 1 tablet (10 mg total) by mouth at bedtime as needed for sleep., Disp: 30 tablet, Rfl: 2   EPINEPHrine 0.3 mg/0.3 mL IJ SOAJ injection, Inject 0.3 mg into the muscle as needed for anaphylaxis., Disp: 2 each, Rfl: 1   estradiol (ESTRACE) 2 MG tablet, Take 1 tablet (2 mg total) by mouth daily., Disp: 90 tablet, Rfl: 12   estradiol (ESTRACE) 2 MG tablet, Take 1 tablet (2 mg total) by mouth daily., Disp: 30 tablet, Rfl: 12   fluconazole (DIFLUCAN) 150 MG tablet, Take 1 tablet (150 mg total) by mouth daily., Disp: 14 tablet, Rfl: 5   fluticasone (FLONASE) 50 MCG/ACT nasal spray, Place 2 sprays into both nostrils daily., Disp: 16 g, Rfl: 5   Fluticasone-Umeclidin-Vilant (TRELEGY ELLIPTA) 200-62.5-25 MCG/ACT AEPB, Inhale 1 puff into the lungs daily., Disp: 60 each, Rfl: 5   Galcanezumab-gnlm (EMGALITY) 120 MG/ML SOAJ, Inject 120 mg into  the skin every 30 (thirty) days., Disp: 3 mL, Rfl: 0   Hypertonic Nasal Wash (SINUS RINSE REFILL) PACK, Use one packet dissolved in water as needed. (Patient taking differently: Place 1 each into the nose in the morning and at bedtime.), Disp: 100 each, Rfl: 5   ibuprofen (ADVIL) 800 MG tablet, Take 1 tablet (800 mg total) by mouth 3 (three) times daily as needed (pain.)., Disp: 270 tablet, Rfl: 0   ipratropium (ATROVENT) 0.06 % nasal spray, Place 2 sprays into both nostrils 4 (four) times daily., Disp: 45 mL, Rfl: 3   linaclotide (LINZESS) 145 MCG CAPS capsule, Take 1 capsule (145 mcg total) by mouth daily before breakfast., Disp: 60 capsule, Rfl: 3   linaclotide (LINZESS) 290 MCG CAPS capsule, Take 1 capsule (290 mcg total) by mouth daily before breakfast., Disp: 90 capsule, Rfl: 1   meclizine (ANTIVERT) 25 MG tablet, Take 25 mg by mouth 3 (three) times daily as needed (dizziness/vertigo). , Disp: , Rfl:    meloxicam (MOBIC) 15 MG tablet, Take 1 tablet (15 mg total) by mouth daily., Disp: 30 tablet, Rfl: 0   MUCUS RELIEF ER 600 MG 12 hr tablet, Take  600-1,200 mg by mouth 2 (two) times daily as needed (congestion). , Disp: , Rfl:    ondansetron (ZOFRAN-ODT) 4 MG disintegrating tablet, Dissolve 1-2 tablets (4-8 mg total) by mouth every 8 (eight) hours as needed. May take with Rizatriptan, Disp: 30 tablet, Rfl: 3   pantoprazole (PROTONIX) 40 MG tablet, Take 1 tablet (40 mg total) by mouth at bedtime., Disp: 90 tablet, Rfl: 3   Pediatric Multiple Vit-C-FA (MULTIVITAMIN ANIMAL SHAPES, WITH CA/FA,) with C & FA chewable tablet, Chew 2 tablets by mouth daily., Disp: , Rfl:    Rimegepant Sulfate (NURTEC) 75 MG TBDP, Dissolve 1 tablet by mouth daily as needed for migraines. Take as close to onset of migraine as possible. One daily maximum., Disp: 16 tablet, Rfl: 11   rizatriptan (MAXALT-MLT) 10 MG disintegrating tablet, Dissolve 1 tablet (10 mg total) by mouth as needed at onset of migraine. May take another tablet 2 hours later if needed, max 2 tablets in 24 hours., Disp: 27 tablet, Rfl: 3   simethicone (MYLICON) 80 MG chewable tablet, Chew 80 mg by mouth every 6 (six) hours as needed for flatulence., Disp: , Rfl:    tezepelumab-ekko (TEZSPIRE) 210 MG/1.91ML syringe, Inject 1.91 mLs (210 mg total) into the skin every 28 (twenty-eight) days., Disp: 1.91 mL, Rfl: 11   tiZANidine (ZANAFLEX) 4 MG tablet, Take 1 tablet by mouth every 8  hours as needed., Disp: 10 tablet, Rfl: 0   triamcinolone ointment (KENALOG) 0.1 %, APPLY TO AFFECTED AREA(S) 2 TIMES A DAY, Disp: 454 g, Rfl: 2   valACYclovir (VALTREX) 500 MG tablet, Take 500 mg by mouth 2 (two) times daily., Disp: , Rfl:    valACYclovir (VALTREX) 500 MG tablet, Take 2 tablets (1,000 mg total) by mouth 2 (two) times daily as needed for acute flares, Disp: 120 tablet, Rfl: 1   Vibegron (GEMTESA) 75 MG TABS, Take 1 tablet by mouth daily., Disp: 30 tablet, Rfl: 11   Vibegron (GEMTESA) 75 MG TABS, Take 1 tablet by mouth daily., Disp: 90 tablet, Rfl: 4  Current Facility-Administered Medications:     tezepelumab-ekko (TEZSPIRE) 210 MG/1.91ML syringe 210 mg, 210 mg, Subcutaneous, Q28 days, Valentina Shaggy, MD, 210 mg at 11/13/22 0959   Objective:     Vitals:   11/21/22 0938  BP: 120/78  Pulse: 76  SpO2: 100%  Weight: 172 lb (78 kg)  Height: '5\' 6"'$  (1.676 m)      Body mass index is 27.76 kg/m.    Physical Exam:    General: Well-appearing, cooperative, sitting comfortably in no acute distress.   OMT Physical Exam:  ASIS Compression Test: Positive Right Cervical: TTP paraspinal, C3 RRSL Rib: Bilateral elevated first rib with TTP Thoracic: TTP paraspinal, T4-7 RRSL Lumbar: TTP paraspinal, 1-3 RRSL Pelvis: Right anterior innominate    Electronically signed by:  Donna Park D.[facility] 10:05 AM 11/21/22

## 2022-11-21 ENCOUNTER — Ambulatory Visit (INDEPENDENT_AMBULATORY_CARE_PROVIDER_SITE_OTHER): Payer: 59 | Admitting: Sports Medicine

## 2022-11-21 VITALS — BP 120/78 | HR 76 | Ht 66.0 in | Wt 172.0 lb

## 2022-11-21 DIAGNOSIS — M5441 Lumbago with sciatica, right side: Secondary | ICD-10-CM

## 2022-11-21 DIAGNOSIS — M542 Cervicalgia: Secondary | ICD-10-CM

## 2022-11-21 DIAGNOSIS — M9905 Segmental and somatic dysfunction of pelvic region: Secondary | ICD-10-CM

## 2022-11-21 DIAGNOSIS — G8929 Other chronic pain: Secondary | ICD-10-CM | POA: Diagnosis not present

## 2022-11-21 DIAGNOSIS — M9903 Segmental and somatic dysfunction of lumbar region: Secondary | ICD-10-CM

## 2022-11-21 DIAGNOSIS — M9902 Segmental and somatic dysfunction of thoracic region: Secondary | ICD-10-CM | POA: Diagnosis not present

## 2022-11-21 DIAGNOSIS — M810 Age-related osteoporosis without current pathological fracture: Secondary | ICD-10-CM | POA: Diagnosis not present

## 2022-11-21 DIAGNOSIS — M9908 Segmental and somatic dysfunction of rib cage: Secondary | ICD-10-CM

## 2022-11-21 DIAGNOSIS — M9901 Segmental and somatic dysfunction of cervical region: Secondary | ICD-10-CM

## 2022-11-21 NOTE — Patient Instructions (Signed)
Good to see you   

## 2022-11-26 ENCOUNTER — Other Ambulatory Visit (HOSPITAL_COMMUNITY): Payer: Self-pay

## 2022-11-26 ENCOUNTER — Other Ambulatory Visit: Payer: Self-pay | Admitting: Adult Health

## 2022-11-26 ENCOUNTER — Other Ambulatory Visit: Payer: Self-pay

## 2022-11-26 DIAGNOSIS — G43919 Migraine, unspecified, intractable, without status migrainosus: Secondary | ICD-10-CM

## 2022-11-26 MED ORDER — IBUPROFEN 800 MG PO TABS
800.0000 mg | ORAL_TABLET | Freq: Three times a day (TID) | ORAL | 0 refills | Status: DC | PRN
  Filled 2022-11-26 – 2023-06-25 (×2): qty 270, 90d supply, fill #0

## 2022-11-26 MED ORDER — VALACYCLOVIR HCL 500 MG PO TABS
1000.0000 mg | ORAL_TABLET | Freq: Two times a day (BID) | ORAL | 0 refills | Status: DC
  Filled 2022-11-26 – 2022-12-02 (×2): qty 360, 90d supply, fill #0

## 2022-11-26 NOTE — Telephone Encounter (Signed)
Ok to fill the ibuprofen '800mg'$  for 90 days?

## 2022-11-27 ENCOUNTER — Other Ambulatory Visit: Payer: Self-pay

## 2022-11-27 ENCOUNTER — Other Ambulatory Visit (HOSPITAL_COMMUNITY): Payer: Self-pay

## 2022-12-02 ENCOUNTER — Other Ambulatory Visit (HOSPITAL_COMMUNITY): Payer: Self-pay

## 2022-12-02 ENCOUNTER — Other Ambulatory Visit: Payer: Self-pay

## 2022-12-03 ENCOUNTER — Other Ambulatory Visit (HOSPITAL_COMMUNITY): Payer: Self-pay

## 2022-12-11 ENCOUNTER — Other Ambulatory Visit: Payer: Self-pay | Admitting: Adult Health

## 2022-12-11 ENCOUNTER — Other Ambulatory Visit (HOSPITAL_COMMUNITY): Payer: Self-pay

## 2022-12-11 ENCOUNTER — Ambulatory Visit (INDEPENDENT_AMBULATORY_CARE_PROVIDER_SITE_OTHER): Payer: 59

## 2022-12-11 ENCOUNTER — Other Ambulatory Visit: Payer: Self-pay

## 2022-12-11 DIAGNOSIS — J4550 Severe persistent asthma, uncomplicated: Secondary | ICD-10-CM | POA: Diagnosis not present

## 2022-12-11 MED ORDER — VALACYCLOVIR HCL 500 MG PO TABS
1000.0000 mg | ORAL_TABLET | Freq: Two times a day (BID) | ORAL | 0 refills | Status: AC
  Filled 2022-12-11 – 2022-12-18 (×2): qty 360, 90d supply, fill #0

## 2022-12-13 ENCOUNTER — Other Ambulatory Visit (HOSPITAL_COMMUNITY): Payer: Self-pay

## 2022-12-15 ENCOUNTER — Other Ambulatory Visit (HOSPITAL_COMMUNITY): Payer: Self-pay

## 2022-12-18 ENCOUNTER — Other Ambulatory Visit: Payer: Self-pay

## 2022-12-18 ENCOUNTER — Other Ambulatory Visit (HOSPITAL_COMMUNITY): Payer: Self-pay

## 2022-12-18 NOTE — Progress Notes (Signed)
Donna Park Donna Park Donna Park Phone: [phone]   Assessment and Plan:     1. Neck pain 2. Chronic bilateral low back pain with right-sided sciatica 3. Somatic dysfunction of cervical region 4. Somatic dysfunction of thoracic region 5. Somatic dysfunction of lumbar region 6. Somatic dysfunction of pelvic region 7. Somatic dysfunction of rib region -Chronic with exacerbation, subsequent sports medicine visit - Continued flares of multiple musculoskeletal regions with most prominent being neck, bilateral trapezius, low back.  Patient did get moderate relief in neck and trapezius pain after trigger point injections  - Patient has received significant relief with OMT in the past.  Elects for repeat OMT today.  Tolerated well per note below. - Decision today to treat with OMT was based on Physical Exam   After verbal consent patient was treated with HVLA (high velocity low amplitude), ME (muscle energy), FPR (flex positional release), ST (soft tissue), PC/PD (Pelvic Compression/ Pelvic Decompression) techniques in cervical, rib, thoracic, lumbar, and pelvic areas. Patient tolerated the procedure well with improvement in symptoms.  Patient educated on potential side effects of soreness and recommended to rest, hydrate, and use Tylenol as needed for pain control.    8. Osteoporosis without current pathological fracture, unspecified osteoporosis type   -Chronic - Patient with T score -2.9 on DEXA scan from 08/12/2022 indicating osteoporosis - Patient has been taking vitamin D 50,000 units and calcium with calcium and vitamin D levels currently WNL after checking on office visit on 09/26/2022 - Patient has been following with GI and has a history of esophageal narrowing with dilation procedure.  Due to alendronate potential GI side effects, we will wait for full results from GI before starting medication - If GI provides  clearance, patient could start alendronate 70 mg weekly after her 52th birthday on 01/25/2021   Pertinent previous records reviewed include none   Follow Up: 4 weeks for reevaluation       Subjective:   I, Donna Park, am serving as a Education administrator for [name]   Chief Complaint: left shoulder pain and tightness   HPI:  01/31/2022 Patient is a 52 year old female complaining of neck and shoulder pain. Patient states been going on for about 2 weeks has knots in her shoulders both neck and shoulders have been stiff , interested in OMT , knots in her upper trap , low back and her sciatic nerve on her right side flares up when she's having intercourse goes down her leg and upper her back    02/21/2022 Patient states that her neck and her shoulders are still bothering her after her adjustment she was very tight is still tight in the upper back , still wants the adjustment will get a massage later tonight as well  , hip still hurts , her tailbone she woke up one morning with pain     03/06/2022 Patient states still has upper trap tightness, finder her self leaning on that left side    03/28/2022 Patient states that she is still tight time for an adjustment would like to discuss a lower spot for CSI    04/25/2022 Patient states that her neck is heavy, would like a referral for MRI, still neck tightness and pain,   05/23/2022 Patient states she is ready for the OMT but the left shoulder clavicle have been sore, neck and shoulder still hurts. Patient has had the MRI of the ankle this morning. Patient  wanting to know if she can get another shoulder injection today    06/20/2022 Patient states that she is a little better decreased ROM but is going to PT , ankle feels better bought different shoes    08/08/2022  Patient states shoulder still hurts has been dry needling and want to talk talk about lidocaine again and then low back pain want referral for PT and sciatic pain     08/29/2022 Patient states wants OMT, knot still hurts    09/26/2022 Patient states that her shoulder still hurts , last week her bones were hurting , body is achy and painful    10/24/2022 Patient states that the bones and the back of her neck are getting tight and painful same spot as when she came in the first time, also the upper trap knot coming back    11/21/2022 Patient states neck and,  shoulder are tight , stopped PT , needs  tune up   12/19/2022 Patient states she is hurting everywhere today , neck and shoulder are stiff, needs a good stretch today     Relevant Historical Information: History of migraines being treated with Botox injections  Additional pertinent review of systems negative.   Current Outpatient Medications:    acetaminophen (TYLENOL) 500 MG tablet, Take by mouth., Disp: , Rfl:    acyclovir ointment (ZOVIRAX) 5 %, Apply onto the affected area every 3 hours., Disp: 15 g, Rfl: 1   albuterol (VENTOLIN HFA) 108 (90 Base) MCG/ACT inhaler, Inhale 1 - 2 puffs into the lungs every 6 hours as needed for wheezing or shortness of breath., Disp: 6.7 g, Rfl: 1   [START ON 01/25/2023] alendronate (FOSAMAX) 70 MG tablet, Take 1 tablet (70 mg total) by mouth every 7 (seven) days., Disp: 13 tablet, Rfl: 0   amitriptyline (ELAVIL) 10 MG tablet, Take 1 tablet (10 mg total) by mouth at bedtime., Disp: 90 tablet, Rfl: 1   azelastine (ASTELIN) 0.1 % nasal spray, Place 2 sprays into both nostrils 2 (two) times daily., Disp: 30 mL, Rfl: 5   Azelastine-Fluticasone (DYMISTA) 137-50 MCG/ACT SUSP, Place 2 sprays into both nostrils 2 (two) times daily as needed., Disp: 23 g, Rfl: 5   CALCIUM PO, Take 1 tablet by mouth 4 (four) times a week., Disp: , Rfl:    cetirizine (ZYRTEC) 10 MG tablet, Take 2 tablets (20 mg total) by mouth 2 (two) times daily., Disp: 360 tablet, Rfl: 1   Cyanocobalamin (NASCOBAL NA), Place 1 spray into the nose every Sunday., Disp: , Rfl:    diazepam (VALIUM) 10 MG  tablet, Take 1 tablet (10 mg total) by mouth at bedtime as needed for sleep., Disp: 30 tablet, Rfl: 2   EPINEPHrine 0.3 mg/0.3 mL IJ SOAJ injection, Inject 0.3 mg into the muscle as needed for anaphylaxis., Disp: 2 each, Rfl: 1   estradiol (ESTRACE) 2 MG tablet, Take 1 tablet (2 mg total) by mouth daily., Disp: 90 tablet, Rfl: 12   estradiol (ESTRACE) 2 MG tablet, Take 1 tablet (2 mg total) by mouth daily., Disp: 30 tablet, Rfl: 12   fluconazole (DIFLUCAN) 150 MG tablet, Take 1 tablet (150 mg total) by mouth daily., Disp: 14 tablet, Rfl: 5   fluticasone (FLONASE) 50 MCG/ACT nasal spray, Place 2 sprays into both nostrils daily., Disp: 16 g, Rfl: 5   Fluticasone-Umeclidin-Vilant (TRELEGY ELLIPTA) 200-62.5-25 MCG/ACT AEPB, Inhale 1 puff into the lungs daily., Disp: 60 each, Rfl: 5   Galcanezumab-gnlm (EMGALITY) 120 MG/ML SOAJ, Inject 120 mg  into the skin every 30 (thirty) days., Disp: 3 mL, Rfl: 0   Hypertonic Nasal Wash (SINUS RINSE REFILL) PACK, Use one packet dissolved in water as needed. (Patient taking differently: Place 1 each into the nose in the morning and at bedtime.), Disp: 100 each, Rfl: 5   ibuprofen (ADVIL) 800 MG tablet, Take 1 tablet (800 mg total) by mouth 3 (three) times daily as needed (pain.)., Disp: 270 tablet, Rfl: 0   ipratropium (ATROVENT) 0.06 % nasal spray, Place 2 sprays into both nostrils 4 (four) times daily., Disp: 45 mL, Rfl: 3   linaclotide (LINZESS) 145 MCG CAPS capsule, Take 1 capsule (145 mcg total) by mouth daily before breakfast., Disp: 60 capsule, Rfl: 3   linaclotide (LINZESS) 290 MCG CAPS capsule, Take 1 capsule (290 mcg total) by mouth daily before breakfast., Disp: 90 capsule, Rfl: 1   meclizine (ANTIVERT) 25 MG tablet, Take 25 mg by mouth 3 (three) times daily as needed (dizziness/vertigo). , Disp: , Rfl:    meloxicam (MOBIC) 15 MG tablet, Take 1 tablet (15 mg total) by mouth daily., Disp: 30 tablet, Rfl: 0   MUCUS RELIEF ER 600 MG 12 hr tablet, Take 600-1,200  mg by mouth 2 (two) times daily as needed (congestion). , Disp: , Rfl:    ondansetron (ZOFRAN-ODT) 4 MG disintegrating tablet, Dissolve 1-2 tablets (4-8 mg total) by mouth every 8 (eight) hours as needed. May take with Rizatriptan, Disp: 30 tablet, Rfl: 3   pantoprazole (PROTONIX) 40 MG tablet, Take 1 tablet (40 mg total) by mouth at bedtime., Disp: 90 tablet, Rfl: 3   Pediatric Multiple Vit-C-FA (MULTIVITAMIN ANIMAL SHAPES, WITH CA/FA,) with C & FA chewable tablet, Chew 2 tablets by mouth daily., Disp: , Rfl:    Rimegepant Sulfate (NURTEC) 75 MG TBDP, Dissolve 1 tablet by mouth daily as needed for migraines. Take as close to onset of migraine as possible. One daily maximum., Disp: 16 tablet, Rfl: 11   rizatriptan (MAXALT-MLT) 10 MG disintegrating tablet, Dissolve 1 tablet (10 mg total) by mouth as needed at onset of migraine. May take another tablet 2 hours later if needed, max 2 tablets in 24 hours., Disp: 27 tablet, Rfl: 3   simethicone (MYLICON) 80 MG chewable tablet, Chew 80 mg by mouth every 6 (six) hours as needed for flatulence., Disp: , Rfl:    tezepelumab-ekko (TEZSPIRE) 210 MG/1.91ML syringe, Inject 1.91 mLs (210 mg total) into the skin every 28 (twenty-eight) days., Disp: 1.91 mL, Rfl: 11   tiZANidine (ZANAFLEX) 4 MG tablet, Take 1 tablet by mouth every 8  hours as needed., Disp: 10 tablet, Rfl: 0   triamcinolone ointment (KENALOG) 0.1 %, APPLY TO AFFECTED AREA(S) 2 TIMES A DAY, Disp: 454 g, Rfl: 2   valACYclovir (VALTREX) 500 MG tablet, Take 500 mg by mouth 2 (two) times daily., Disp: , Rfl:    valACYclovir (VALTREX) 500 MG tablet, Take 2 tablets (1,000 mg total) by mouth 2 (two) times daily., Disp: 360 tablet, Rfl: 0   Vibegron (GEMTESA) 75 MG TABS, Take 1 tablet by mouth daily., Disp: 30 tablet, Rfl: 11   Vibegron (GEMTESA) 75 MG TABS, Take 1 tablet by mouth daily., Disp: 90 tablet, Rfl: 4  Current Facility-Administered Medications:    tezepelumab-ekko (TEZSPIRE) 210 MG/1.91ML syringe  210 mg, 210 mg, Subcutaneous, Q28 days, Valentina Shaggy, MD, 210 mg at 12/11/22 1818   Objective:     Vitals:   12/19/22 0940  Pulse: 81  SpO2: 98%  Weight: 198 lb (  89.8 kg)  Height: '5\' 6"'$  (1.676 m)      Body mass index is 31.96 kg/m.    Physical Exam:    General: Well-appearing, cooperative, sitting comfortably in no acute distress.   OMT Physical Exam:  ASIS Compression Test: Positive left Cervical: TTP paraspinal, C1 RR, C3 RL SR Rib: Bilateral elevated first rib with TTP Thoracic: TTP paraspinal, T4-6 RRSL, T7-9 RLSR Lumbar: TTP paraspinal, L1-3 RLSR Pelvis: Left anterior innominate    Electronically signed by:  Donna Park D.[facility] 10:02 AM 12/19/22

## 2022-12-19 ENCOUNTER — Ambulatory Visit (INDEPENDENT_AMBULATORY_CARE_PROVIDER_SITE_OTHER): Payer: 59 | Admitting: Sports Medicine

## 2022-12-19 ENCOUNTER — Other Ambulatory Visit (HOSPITAL_COMMUNITY): Payer: Self-pay

## 2022-12-19 VITALS — HR 81 | Ht 66.0 in | Wt 198.0 lb

## 2022-12-19 DIAGNOSIS — M542 Cervicalgia: Secondary | ICD-10-CM | POA: Diagnosis not present

## 2022-12-19 DIAGNOSIS — M5441 Lumbago with sciatica, right side: Secondary | ICD-10-CM | POA: Diagnosis not present

## 2022-12-19 DIAGNOSIS — M9905 Segmental and somatic dysfunction of pelvic region: Secondary | ICD-10-CM | POA: Diagnosis not present

## 2022-12-19 DIAGNOSIS — G8929 Other chronic pain: Secondary | ICD-10-CM | POA: Diagnosis not present

## 2022-12-19 DIAGNOSIS — M9903 Segmental and somatic dysfunction of lumbar region: Secondary | ICD-10-CM

## 2022-12-19 DIAGNOSIS — M9902 Segmental and somatic dysfunction of thoracic region: Secondary | ICD-10-CM | POA: Diagnosis not present

## 2022-12-19 DIAGNOSIS — M9908 Segmental and somatic dysfunction of rib cage: Secondary | ICD-10-CM | POA: Diagnosis not present

## 2022-12-19 DIAGNOSIS — M9901 Segmental and somatic dysfunction of cervical region: Secondary | ICD-10-CM | POA: Diagnosis not present

## 2022-12-19 DIAGNOSIS — M810 Age-related osteoporosis without current pathological fracture: Secondary | ICD-10-CM | POA: Diagnosis not present

## 2022-12-25 ENCOUNTER — Encounter: Payer: Self-pay | Admitting: Neurology

## 2022-12-25 ENCOUNTER — Other Ambulatory Visit (HOSPITAL_COMMUNITY): Payer: Self-pay

## 2022-12-25 ENCOUNTER — Telehealth: Payer: Self-pay | Admitting: *Deleted

## 2022-12-25 DIAGNOSIS — G43709 Chronic migraine without aura, not intractable, without status migrainosus: Secondary | ICD-10-CM

## 2022-12-25 MED ORDER — BOTOX 200 UNITS IJ SOLR
INTRAMUSCULAR | 3 refills | Status: DC
  Filled 2022-12-25: qty 1, fill #0
  Filled 2023-01-08: qty 1, 30d supply, fill #0
  Filled 2023-07-06 – 2023-10-15 (×2): qty 1, 84d supply, fill #0

## 2022-12-25 NOTE — Telephone Encounter (Signed)
Will need new botox auth now that patient has [employer]. Card is scanned into chart. Next appt is on 01/27/23.    Chronic Migraine CPT 64615  Botox [id number] Units:200  G43.709 Chronic Migraine without aura, not intractable, without status migrainous

## 2022-12-25 NOTE — Addendum Note (Signed)
Addended by: Gildardo Griffes on: 12/25/2022 03:54 PM   Modules accepted: Orders

## 2022-12-29 ENCOUNTER — Other Ambulatory Visit (HOSPITAL_COMMUNITY): Payer: Self-pay

## 2022-12-30 ENCOUNTER — Other Ambulatory Visit (HOSPITAL_COMMUNITY): Payer: Self-pay

## 2022-12-31 ENCOUNTER — Other Ambulatory Visit (HOSPITAL_COMMUNITY): Payer: Self-pay

## 2023-01-02 ENCOUNTER — Other Ambulatory Visit (HOSPITAL_COMMUNITY): Payer: Self-pay

## 2023-01-05 ENCOUNTER — Other Ambulatory Visit (HOSPITAL_COMMUNITY): Payer: Self-pay

## 2023-01-07 ENCOUNTER — Other Ambulatory Visit (HOSPITAL_COMMUNITY): Payer: Self-pay

## 2023-01-08 ENCOUNTER — Other Ambulatory Visit (HOSPITAL_COMMUNITY): Payer: Self-pay

## 2023-01-08 ENCOUNTER — Other Ambulatory Visit: Payer: Self-pay

## 2023-01-08 ENCOUNTER — Ambulatory Visit: Payer: 59

## 2023-01-09 ENCOUNTER — Other Ambulatory Visit (HOSPITAL_COMMUNITY): Payer: Self-pay

## 2023-01-12 NOTE — Telephone Encounter (Signed)
Checking for update for botox PA, pt is scheduled for 01/27/23

## 2023-01-14 ENCOUNTER — Other Ambulatory Visit (HOSPITAL_COMMUNITY): Payer: Self-pay

## 2023-01-14 NOTE — Telephone Encounter (Signed)
Pharmacy Patient Advocate Encounter   Received notification from [facility] that prior authorization for Botox 200UNIT solution is required/requested.    PA submitted on 01/14/2023 to (ins) [employer] via CoverMyMeds Key [id number]  Status is pending

## 2023-01-14 NOTE — Telephone Encounter (Signed)
  Benefit Verification [id number] Submitted!

## 2023-01-15 ENCOUNTER — Ambulatory Visit: Payer: 59

## 2023-01-15 NOTE — Progress Notes (Signed)
Donna Park D.Donna Park Phone: [phone]   Assessment and Plan:     1. Neck pain 2. Chronic bilateral low back pain with right-sided sciatica 3. Somatic dysfunction of cervical region 4. Somatic dysfunction of thoracic region 5. Somatic dysfunction of lumbar region 6. Somatic dysfunction of pelvic region 7. Somatic dysfunction of rib region  -Chronic with exacerbation, subsequent visit - Recurrence of multiple musculoskeletal complaints with most prominent being in bilateral hips and neck - Patient has received significant relief with OMT in the past.  Elects for repeat OMT today.  Tolerated well per note below. - Decision today to treat with OMT was based on Physical Exam   After verbal consent patient was treated with HVLA (high velocity low amplitude), ME (muscle energy), FPR (flex positional release), ST (soft tissue), PC/PD (Pelvic Compression/ Pelvic Decompression) techniques in cervical, rib, thoracic, lumbar, and pelvic areas. Patient tolerated the procedure well with improvement in symptoms.  Patient educated on potential side effects of soreness and recommended to rest, hydrate, and use Tylenol as needed for pain control.   Pertinent previous records reviewed include none   Follow Up: 4 weeks for reevaluation.  Could discuss starting medication for osteoporosis versus obtaining lab work to ensure vitamin D and calcium levels are WNL versus trigger point injections versus repeat OMT   Subjective:   I, Donna Park, am serving as a Education administrator for Doctor Glennon Mac   Chief Complaint: left shoulder pain and tightness   HPI:  01/31/2022 Patient is a 52 year old female complaining of neck and shoulder pain. Patient states been going on for about 2 weeks has knots in her shoulders both neck and shoulders have been stiff , interested in OMT , knots in her upper trap , low back and her sciatic nerve on her right  side flares up when she's having intercourse goes down her leg and upper her back    02/21/2022 Patient states that her neck and her shoulders are still bothering her after her adjustment she was very tight is still tight in the upper back , still wants the adjustment will get a massage later tonight as well  , hip still hurts , her tailbone she woke up one morning with pain     03/06/2022 Patient states still has upper trap tightness, finder her self leaning on that left side    03/28/2022 Patient states that she is still tight time for an adjustment would like to discuss a lower spot for CSI    04/25/2022 Patient states that her neck is heavy, would like a referral for MRI, still neck tightness and pain,   05/23/2022 Patient states she is ready for the OMT but the left shoulder clavicle have been sore, neck and shoulder still hurts. Patient has had the MRI of the ankle this morning. Patient wanting to know if she can get another shoulder injection today    06/20/2022 Patient states that she is a little better decreased ROM but is going to PT , ankle feels better bought different shoes    08/08/2022  Patient states shoulder still hurts has been dry needling and want to talk talk about lidocaine again and then low back pain want referral for PT and sciatic pain    08/29/2022 Patient states wants OMT, knot still hurts    09/26/2022 Patient states that her shoulder still hurts , last week her bones were hurting , body is achy  and painful    10/24/2022 Patient states that the bones and the back of her neck are getting tight and painful same spot as when she came in the first time, also the upper trap knot coming back    11/21/2022 Patient states neck and,  shoulder are tight , stopped PT , needs  tune up    12/19/2022 Patient states she is hurting everywhere today , neck and shoulder are stiff, needs a good stretch today    01/16/2023 Patient states neck , low back and hips , has been able to  work out, neck knots have gotten better but still tight     Relevant Historical Information: History of migraines being treated with Botox injections    Additional pertinent review of systems negative.  Current Outpatient Medications  Medication Sig Dispense Refill   acetaminophen (TYLENOL) 500 MG tablet Take by mouth.     acyclovir ointment (ZOVIRAX) 5 % Apply onto the affected area every 3 hours. 15 g 1   albuterol (VENTOLIN HFA) 108 (90 Base) MCG/ACT inhaler Inhale 1 - 2 puffs into the lungs every 6 hours as needed for wheezing or shortness of breath. 6.7 g 1   [START ON 01/25/2023] alendronate (FOSAMAX) 70 MG tablet Take 1 tablet (70 mg total) by mouth every 7 (seven) days. 13 tablet 0   amitriptyline (ELAVIL) 10 MG tablet Take 1 tablet (10 mg total) by mouth at bedtime. 90 tablet 1   azelastine (ASTELIN) 0.1 % nasal spray Place 2 sprays into both nostrils 2 (two) times daily. 30 mL 5   Azelastine-Fluticasone (DYMISTA) 137-50 MCG/ACT SUSP Place 2 sprays into both nostrils 2 (two) times daily as needed. 23 g 5   botulinum toxin Type A (BOTOX) 200 units injection Provider to inject 155 units into the muscles of the head and neck every 12 weeks. Discard remainder. 1 each 3   CALCIUM PO Take 1 tablet by mouth 4 (four) times a week.     cetirizine (ZYRTEC) 10 MG tablet Take 2 tablets (20 mg total) by mouth 2 (two) times daily. 360 tablet 1   Cyanocobalamin (NASCOBAL NA) Place 1 spray into the nose every Sunday.     diazepam (VALIUM) 10 MG tablet Take 1 tablet (10 mg total) by mouth at bedtime as needed for sleep. 30 tablet 2   EPINEPHrine 0.3 mg/0.3 mL IJ SOAJ injection Inject 0.3 mg into the muscle as needed for anaphylaxis. 2 each 1   estradiol (ESTRACE) 2 MG tablet Take 1 tablet (2 mg total) by mouth daily. 90 tablet 12   estradiol (ESTRACE) 2 MG tablet Take 1 tablet (2 mg total) by mouth daily. 30 tablet 12   fluconazole (DIFLUCAN) 150 MG tablet Take 1 tablet (150 mg total) by mouth daily.  14 tablet 5   fluticasone (FLONASE) 50 MCG/ACT nasal spray Place 2 sprays into both nostrils daily. 16 g 5   Fluticasone-Umeclidin-Vilant (TRELEGY ELLIPTA) 200-62.5-25 MCG/ACT AEPB Inhale 1 puff into the lungs daily. 60 each 5   Galcanezumab-gnlm (EMGALITY) 120 MG/ML SOAJ Inject 120 mg into the skin every 30 (thirty) days. 3 mL 0   Hypertonic Nasal Wash (SINUS RINSE REFILL) PACK Use one packet dissolved in water as needed. (Patient taking differently: Place 1 each into the nose in the morning and at bedtime.) 100 each 5   ibuprofen (ADVIL) 800 MG tablet Take 1 tablet (800 mg total) by mouth 3 (three) times daily as needed (pain.). 270 tablet 0   ipratropium (ATROVENT)  0.06 % nasal spray Place 2 sprays into both nostrils 4 (four) times daily. 45 mL 3   linaclotide (LINZESS) 145 MCG CAPS capsule Take 1 capsule (145 mcg total) by mouth daily before breakfast. 60 capsule 3   linaclotide (LINZESS) 290 MCG CAPS capsule Take 1 capsule (290 mcg total) by mouth daily before breakfast. 90 capsule 1   meclizine (ANTIVERT) 25 MG tablet Take 25 mg by mouth 3 (three) times daily as needed (dizziness/vertigo).      meloxicam (MOBIC) 15 MG tablet Take 1 tablet (15 mg total) by mouth daily. 30 tablet 0   MUCUS RELIEF ER 600 MG 12 hr tablet Take 600-1,200 mg by mouth 2 (two) times daily as needed (congestion).      ondansetron (ZOFRAN-ODT) 4 MG disintegrating tablet Dissolve 1-2 tablets (4-8 mg total) by mouth every 8 (eight) hours as needed. May take with Rizatriptan 30 tablet 3   pantoprazole (PROTONIX) 40 MG tablet Take 1 tablet (40 mg total) by mouth at bedtime. 90 tablet 3   Pediatric Multiple Vit-C-FA (MULTIVITAMIN ANIMAL SHAPES, WITH CA/FA,) with C & FA chewable tablet Chew 2 tablets by mouth daily.     Rimegepant Sulfate (NURTEC) 75 MG TBDP Dissolve 1 tablet by mouth daily as needed for migraines. Take as close to onset of migraine as possible. One daily maximum. 16 tablet 11   rizatriptan (MAXALT-MLT) 10 MG  disintegrating tablet Dissolve 1 tablet (10 mg total) by mouth as needed at onset of migraine. May take another tablet 2 hours later if needed, max 2 tablets in 24 hours. 27 tablet 3   simethicone (MYLICON) 80 MG chewable tablet Chew 80 mg by mouth every 6 (six) hours as needed for flatulence.     tezepelumab-ekko (TEZSPIRE) 210 MG/1.91ML syringe Inject 1.91 mLs (210 mg total) into the skin every 28 (twenty-eight) days. 1.91 mL 11   tiZANidine (ZANAFLEX) 4 MG tablet Take 1 tablet by mouth every 8  hours as needed. 10 tablet 0   triamcinolone ointment (KENALOG) 0.1 % APPLY TO AFFECTED AREA(S) 2 TIMES A DAY 454 g 2   valACYclovir (VALTREX) 500 MG tablet Take 500 mg by mouth 2 (two) times daily.     valACYclovir (VALTREX) 500 MG tablet Take 2 tablets (1,000 mg total) by mouth 2 (two) times daily. 360 tablet 0   Vibegron (GEMTESA) 75 MG TABS Take 1 tablet by mouth daily. 30 tablet 11   Vibegron (GEMTESA) 75 MG TABS Take 1 tablet by mouth daily. 90 tablet 4   Current Facility-Administered Medications  Medication Dose Route Frequency Provider Last Rate Last Admin   tezepelumab-ekko (TEZSPIRE) 210 MG/1.91ML syringe 210 mg  210 mg Subcutaneous Q28 days Valentina Shaggy, MD   210 mg at 12/11/22 1818      Objective:     Vitals:   01/16/23 1000  BP: 122/80  Pulse: 71  SpO2: 96%  Weight: 178 lb (80.7 kg)  Height: 5' 6"$  (1.676 m)      Body mass index is 28.73 kg/m.    Physical Exam:     General: Well-appearing, cooperative, sitting comfortably in no acute distress.   OMT Physical Exam:   ASIS Compression Test: Positive left Cervical: TTP paraspinal, C1 RR, C3 RL SR Rib: Bilateral elevated first rib with TTP Thoracic: TTP paraspinal, T4-6 RRSL, T7-9 RLSR Lumbar: TTP paraspinal, L1-3 RLSR Pelvis: Left anterior innominate    Electronically signed by:  Donna Park D.[facility] 10:42 AM 01/16/23

## 2023-01-16 ENCOUNTER — Ambulatory Visit (INDEPENDENT_AMBULATORY_CARE_PROVIDER_SITE_OTHER): Payer: 59 | Admitting: Sports Medicine

## 2023-01-16 ENCOUNTER — Ambulatory Visit (INDEPENDENT_AMBULATORY_CARE_PROVIDER_SITE_OTHER): Payer: 59 | Admitting: *Deleted

## 2023-01-16 VITALS — BP 122/80 | HR 71 | Ht 66.0 in | Wt 178.0 lb

## 2023-01-16 DIAGNOSIS — M9901 Segmental and somatic dysfunction of cervical region: Secondary | ICD-10-CM

## 2023-01-16 DIAGNOSIS — M9908 Segmental and somatic dysfunction of rib cage: Secondary | ICD-10-CM | POA: Diagnosis not present

## 2023-01-16 DIAGNOSIS — M5441 Lumbago with sciatica, right side: Secondary | ICD-10-CM | POA: Diagnosis not present

## 2023-01-16 DIAGNOSIS — M9905 Segmental and somatic dysfunction of pelvic region: Secondary | ICD-10-CM

## 2023-01-16 DIAGNOSIS — M9902 Segmental and somatic dysfunction of thoracic region: Secondary | ICD-10-CM | POA: Diagnosis not present

## 2023-01-16 DIAGNOSIS — M542 Cervicalgia: Secondary | ICD-10-CM | POA: Diagnosis not present

## 2023-01-16 DIAGNOSIS — J4550 Severe persistent asthma, uncomplicated: Secondary | ICD-10-CM | POA: Diagnosis not present

## 2023-01-16 DIAGNOSIS — G8929 Other chronic pain: Secondary | ICD-10-CM | POA: Diagnosis not present

## 2023-01-16 DIAGNOSIS — M9903 Segmental and somatic dysfunction of lumbar region: Secondary | ICD-10-CM | POA: Diagnosis not present

## 2023-01-16 NOTE — Patient Instructions (Signed)
Good to see you   

## 2023-01-19 ENCOUNTER — Other Ambulatory Visit (HOSPITAL_COMMUNITY): Payer: Self-pay

## 2023-01-19 ENCOUNTER — Other Ambulatory Visit: Payer: Self-pay

## 2023-01-19 MED ORDER — BOTOX 200 UNITS IJ SOLR
INTRAMUSCULAR | 3 refills | Status: DC
  Filled 2023-01-19: qty 1, fill #0
  Filled 2023-01-19: qty 1, 84d supply, fill #0
  Filled 2023-04-09: qty 1, 84d supply, fill #1
  Filled 2023-07-01: qty 1, 84d supply, fill #2

## 2023-01-19 NOTE — Telephone Encounter (Signed)
Please send rx to [facility] as soon as you can, thank you.

## 2023-01-19 NOTE — Telephone Encounter (Addendum)
It hasn't been approved yet. The botoxone benefits review below is just saying what our options are but the PA is still pending. I could not find a [facility] to send the Rx to.

## 2023-01-19 NOTE — Telephone Encounter (Signed)
Can you please send rx to [facility] so I can schedule delivery? I believe this means she was approved, not seeing auth # or dates. This was not routed to [place].

## 2023-01-19 NOTE — Telephone Encounter (Signed)
Pharmacy Patient Advocate Encounter  Prior Authorization for Botox 200 Units has been approved.    PA# PA Case ID #: [id number] Effective dates: 01/16/2023 through 07/15/2023  Per test billing the copay is $1,245.50-however when you run along with the copay card it showed a zero copay- This can be filled with [facility]

## 2023-01-19 NOTE — Addendum Note (Signed)
Addended by: Skeet Simmer A on: 01/19/2023 11:03 AM   Modules accepted: Orders

## 2023-01-21 ENCOUNTER — Other Ambulatory Visit (HOSPITAL_COMMUNITY): Payer: Self-pay

## 2023-01-22 ENCOUNTER — Other Ambulatory Visit: Payer: Self-pay

## 2023-01-27 ENCOUNTER — Ambulatory Visit: Payer: Self-pay | Admitting: Neurology

## 2023-01-27 DIAGNOSIS — R300 Dysuria: Secondary | ICD-10-CM | POA: Diagnosis not present

## 2023-01-27 DIAGNOSIS — R350 Frequency of micturition: Secondary | ICD-10-CM | POA: Diagnosis not present

## 2023-01-27 DIAGNOSIS — N898 Other specified noninflammatory disorders of vagina: Secondary | ICD-10-CM | POA: Diagnosis not present

## 2023-02-04 ENCOUNTER — Other Ambulatory Visit (HOSPITAL_COMMUNITY): Payer: Self-pay

## 2023-02-06 ENCOUNTER — Other Ambulatory Visit (HOSPITAL_COMMUNITY): Payer: Self-pay

## 2023-02-09 ENCOUNTER — Other Ambulatory Visit (HOSPITAL_COMMUNITY): Payer: Self-pay

## 2023-02-09 MED ORDER — ESTRADIOL STARTER PACK 10 MCG VA INST
VAGINAL_INSERT | VAGINAL | 1 refills | Status: DC
  Filled 2023-02-09: qty 18, 30d supply, fill #0
  Filled 2023-06-25: qty 18, 28d supply, fill #0
  Filled 2023-06-29: qty 18, 30d supply, fill #0
  Filled 2023-10-09: qty 18, 30d supply, fill #1

## 2023-02-10 ENCOUNTER — Other Ambulatory Visit (HOSPITAL_COMMUNITY): Payer: Self-pay

## 2023-02-12 ENCOUNTER — Other Ambulatory Visit (HOSPITAL_COMMUNITY): Payer: Self-pay

## 2023-02-12 NOTE — Progress Notes (Signed)
[name] D.Mastic [place] Phone: [phone]   Assessment and Plan:     1. Neck pain 2. Chronic bilateral low back pain with right-sided sciatica 3. Somatic dysfunction of cervical region 4. Somatic dysfunction of thoracic region 5. Somatic dysfunction of lumbar region 6. Somatic dysfunction of pelvic region 7. Somatic dysfunction of rib region  -Chronic with exacerbation, subsequent visit - Recurrence of multiple musculoskeletal complaints with most prominent being in neck and lower back.  These were likely flared from patient sleeping on the floor and on a different mattress when visiting her mom - Patient has received significant relief with OMT in the past.  Elects for repeat OMT today.  Tolerated well per note below. - Decision today to treat with OMT was based on Physical Exam   After verbal consent patient was treated with HVLA (high velocity low amplitude), ME (muscle energy), FPR (flex positional release), ST (soft tissue), PC/PD (Pelvic Compression/ Pelvic Decompression) techniques in cervical, rib, thoracic, lumbar, and pelvic areas. Patient tolerated the procedure well with improvement in symptoms.  Patient educated on potential side effects of soreness and recommended to rest, hydrate, and use Tylenol as needed for pain control.   Pertinent previous records reviewed include none   Follow Up: 4 to 6 weeks for reevaluation.  Recommend that patient get calcium and vitamin D levels checked when having her yearly evaluation and lab work.  If calcium and vitamin D levels are sufficient, we could consider starting treatment for osteoporosis.  Patient has medication alendronate, though has not started medication at this time   Subjective:   I, [name], am serving as a Education administrator for Doctor Glennon Mac   Chief Complaint: left shoulder pain and tightness   HPI:  01/31/2022 Patient is a 52 year old female  complaining of neck and shoulder pain. Patient states been going on for about 2 weeks has knots in her shoulders both neck and shoulders have been stiff , interested in OMT , knots in her upper trap , low back and her sciatic nerve on her right side flares up when she's having intercourse goes down her leg and upper her back    02/21/2022 Patient states that her neck and her shoulders are still bothering her after her adjustment she was very tight is still tight in the upper back , still wants the adjustment will get a massage later tonight as well  , hip still hurts , her tailbone she woke up one morning with pain     03/06/2022 Patient states still has upper trap tightness, finder her self leaning on that left side    03/28/2022 Patient states that she is still tight time for an adjustment would like to discuss a lower spot for CSI    04/25/2022 Patient states that her neck is heavy, would like a referral for MRI, still neck tightness and pain,   05/23/2022 Patient states she is ready for the OMT but the left shoulder clavicle have been sore, neck and shoulder still hurts. Patient has had the MRI of the ankle this morning. Patient wanting to know if she can get another shoulder injection today    06/20/2022 Patient states that she is a little better decreased ROM but is going to PT , ankle feels better bought different shoes    08/08/2022  Patient states shoulder still hurts has been dry needling and want to talk talk about lidocaine again and then  low back pain want referral for PT and sciatic pain    08/29/2022 Patient states wants OMT, knot still hurts    09/26/2022 Patient states that her shoulder still hurts , last week her bones were hurting , body is achy and painful    10/24/2022 Patient states that the bones and the back of her neck are getting tight and painful same spot as when she came in the first time, also the upper trap knot coming back    11/21/2022 Patient states neck and,   shoulder are tight , stopped PT , needs  tune up    12/19/2022 Patient states she is hurting everywhere today , neck and shoulder are stiff, needs a good stretch today    01/16/2023 Patient states neck , low back and hips , has been able to work out, neck knots have gotten better but still tight    02/13/2023 Patient states neck is still in pain right shoulder is tight needs an adjustment     Relevant Historical Information: History of migraines being treated with Botox injections  Additional pertinent review of systems negative.  Current Outpatient Medications  Medication Sig Dispense Refill   acetaminophen (TYLENOL) 500 MG tablet Take by mouth.     acyclovir ointment (ZOVIRAX) 5 % Apply onto the affected area every 3 hours. 15 g 1   albuterol (VENTOLIN HFA) 108 (90 Base) MCG/ACT inhaler Inhale 1 - 2 puffs into the lungs every 6 hours as needed for wheezing or shortness of breath. 6.7 g 1   alendronate (FOSAMAX) 70 MG tablet Take 1 tablet (70 mg total) by mouth every 7 (seven) days. 13 tablet 0   amitriptyline (ELAVIL) 10 MG tablet Take 1 tablet (10 mg total) by mouth at bedtime. 90 tablet 1   azelastine (ASTELIN) 0.1 % nasal spray Place 2 sprays into both nostrils 2 (two) times daily. 30 mL 5   Azelastine-Fluticasone (DYMISTA) 137-50 MCG/ACT SUSP Place 2 sprays into both nostrils 2 (two) times daily as needed. 23 g 5   botulinum toxin Type A (BOTOX) 200 units injection Provider to inject 155 units into the muscles of the head and neck every 12 weeks. Discard remainder. 1 each 3   botulinum toxin Type A (BOTOX) 200 units injection Inject 155 units in neck and head muscles every 12 weeks Administrated by provider . Discard remaninig 1 each 3   CALCIUM PO Take 1 tablet by mouth 4 (four) times a week.     cetirizine (ZYRTEC) 10 MG tablet Take 2 tablets (20 mg total) by mouth 2 (two) times daily. 360 tablet 1   Cyanocobalamin (NASCOBAL NA) Place 1 spray into the nose every Sunday.     diazepam  (VALIUM) 10 MG tablet Take 1 tablet (10 mg total) by mouth at bedtime as needed for sleep. 30 tablet 2   EPINEPHrine 0.3 mg/0.3 mL IJ SOAJ injection Inject 0.3 mg into the muscle as needed for anaphylaxis. 2 each 1   estradiol (ESTRACE) 2 MG tablet Take 1 tablet (2 mg total) by mouth daily. 90 tablet 12   estradiol (ESTRACE) 2 MG tablet Take 1 tablet (2 mg total) by mouth daily. 30 tablet 12   Estradiol Starter Pack (IMVEXXY STARTER PACK) 10 MCG INST Insert 1 soft gel daily for 2 weeks. After this, insert 1 soft gel twice weekly 18 each 1   fluconazole (DIFLUCAN) 150 MG tablet Take 1 tablet (150 mg total) by mouth daily. 14 tablet 5   fluticasone (FLONASE)  50 MCG/ACT nasal spray Place 2 sprays into both nostrils daily. 16 g 5   Fluticasone-Umeclidin-Vilant (TRELEGY ELLIPTA) 200-62.5-25 MCG/ACT AEPB Inhale 1 puff into the lungs daily. 60 each 5   Galcanezumab-gnlm (EMGALITY) 120 MG/ML SOAJ Inject 120 mg into the skin every 30 (thirty) days. 3 mL 0   Hypertonic Nasal Wash (SINUS RINSE REFILL) PACK Use one packet dissolved in water as needed. (Patient taking differently: Place 1 each into the nose in the morning and at bedtime.) 100 each 5   ibuprofen (ADVIL) 800 MG tablet Take 1 tablet (800 mg total) by mouth 3 (three) times daily as needed (pain.). 270 tablet 0   ipratropium (ATROVENT) 0.06 % nasal spray Place 2 sprays into both nostrils 4 (four) times daily. 45 mL 3   linaclotide (LINZESS) 145 MCG CAPS capsule Take 1 capsule (145 mcg total) by mouth daily before breakfast. 60 capsule 3   linaclotide (LINZESS) 290 MCG CAPS capsule Take 1 capsule (290 mcg total) by mouth daily before breakfast. 90 capsule 1   meclizine (ANTIVERT) 25 MG tablet Take 25 mg by mouth 3 (three) times daily as needed (dizziness/vertigo).      meloxicam (MOBIC) 15 MG tablet Take 1 tablet (15 mg total) by mouth daily. 30 tablet 0   MUCUS RELIEF ER 600 MG 12 hr tablet Take 600-1,200 mg by mouth 2 (two) times daily as needed  (congestion).      ondansetron (ZOFRAN-ODT) 4 MG disintegrating tablet Dissolve 1-2 tablets (4-8 mg total) by mouth every 8 (eight) hours as needed. May take with Rizatriptan 30 tablet 3   pantoprazole (PROTONIX) 40 MG tablet Take 1 tablet (40 mg total) by mouth at bedtime. 90 tablet 3   Pediatric Multiple Vit-C-FA (MULTIVITAMIN ANIMAL SHAPES, WITH CA/FA,) with C & FA chewable tablet Chew 2 tablets by mouth daily.     Rimegepant Sulfate (NURTEC) 75 MG TBDP Dissolve 1 tablet by mouth daily as needed for migraines. Take as close to onset of migraine as possible. One daily maximum. 16 tablet 11   rizatriptan (MAXALT-MLT) 10 MG disintegrating tablet Dissolve 1 tablet (10 mg total) by mouth as needed at onset of migraine. May take another tablet 2 hours later if needed, max 2 tablets in 24 hours. 27 tablet 3   simethicone (MYLICON) 80 MG chewable tablet Chew 80 mg by mouth every 6 (six) hours as needed for flatulence.     tezepelumab-ekko (TEZSPIRE) 210 MG/1.91ML syringe Inject 1.91 mLs (210 mg total) into the skin every 28 (twenty-eight) days. 1.91 mL 11   tiZANidine (ZANAFLEX) 4 MG tablet Take 1 tablet by mouth every 8  hours as needed. 10 tablet 0   valACYclovir (VALTREX) 500 MG tablet Take 500 mg by mouth 2 (two) times daily.     valACYclovir (VALTREX) 500 MG tablet Take 2 tablets (1,000 mg total) by mouth 2 (two) times daily. 360 tablet 0   Vibegron (GEMTESA) 75 MG TABS Take 1 tablet by mouth daily. 30 tablet 11   Vibegron (GEMTESA) 75 MG TABS Take 1 tablet by mouth daily. 90 tablet 4   Current Facility-Administered Medications  Medication Dose Route Frequency Provider Last Rate Last Admin   tezepelumab-ekko (TEZSPIRE) 210 MG/1.91ML syringe 210 mg  210 mg Subcutaneous Q28 days Valentina Shaggy, MD   210 mg at 02/13/23 1039      Objective:     Vitals:   02/13/23 0954  BP: 118/80  Pulse: 84  SpO2: 99%  Weight: 178 lb (80.7 kg)  Height: '5\' 6"'$  (1.676 m)      Body mass index is 28.73  kg/m.    Physical Exam:     General: Well-appearing, cooperative, sitting comfortably in no acute distress.   OMT Physical Exam:  ASIS Compression Test: Positive Right Cervical: TTP paraspinal, C3 RLSR Rib: Bilateral elevated first rib with TTP Thoracic: TTP paraspinal, C4-7 RRSL Lumbar: TTP paraspinal, L1-3 RRSL Pelvis: Right anterior innominate  Electronically signed by:  [name] D.[facility] 10:56 AM 02/13/23

## 2023-02-13 ENCOUNTER — Ambulatory Visit (INDEPENDENT_AMBULATORY_CARE_PROVIDER_SITE_OTHER): Payer: 59 | Admitting: Sports Medicine

## 2023-02-13 ENCOUNTER — Ambulatory Visit (INDEPENDENT_AMBULATORY_CARE_PROVIDER_SITE_OTHER): Payer: 59

## 2023-02-13 VITALS — BP 118/80 | HR 84 | Ht 66.0 in | Wt 178.0 lb

## 2023-02-13 DIAGNOSIS — M9908 Segmental and somatic dysfunction of rib cage: Secondary | ICD-10-CM | POA: Diagnosis not present

## 2023-02-13 DIAGNOSIS — M5441 Lumbago with sciatica, right side: Secondary | ICD-10-CM | POA: Diagnosis not present

## 2023-02-13 DIAGNOSIS — J4550 Severe persistent asthma, uncomplicated: Secondary | ICD-10-CM | POA: Diagnosis not present

## 2023-02-13 DIAGNOSIS — M542 Cervicalgia: Secondary | ICD-10-CM | POA: Diagnosis not present

## 2023-02-13 DIAGNOSIS — M9905 Segmental and somatic dysfunction of pelvic region: Secondary | ICD-10-CM | POA: Diagnosis not present

## 2023-02-13 DIAGNOSIS — M9902 Segmental and somatic dysfunction of thoracic region: Secondary | ICD-10-CM

## 2023-02-13 DIAGNOSIS — G8929 Other chronic pain: Secondary | ICD-10-CM

## 2023-02-13 DIAGNOSIS — M9901 Segmental and somatic dysfunction of cervical region: Secondary | ICD-10-CM

## 2023-02-13 DIAGNOSIS — M9903 Segmental and somatic dysfunction of lumbar region: Secondary | ICD-10-CM

## 2023-02-13 NOTE — Patient Instructions (Addendum)
Good to see you Neck and trap HEP  4-6 week follow up

## 2023-02-16 ENCOUNTER — Other Ambulatory Visit (HOSPITAL_COMMUNITY): Payer: Self-pay

## 2023-02-18 ENCOUNTER — Other Ambulatory Visit (HOSPITAL_COMMUNITY): Payer: Self-pay

## 2023-02-23 ENCOUNTER — Other Ambulatory Visit: Payer: Self-pay

## 2023-02-24 ENCOUNTER — Other Ambulatory Visit (HOSPITAL_COMMUNITY): Payer: Self-pay

## 2023-03-03 ENCOUNTER — Other Ambulatory Visit (HOSPITAL_COMMUNITY): Payer: Self-pay

## 2023-03-12 ENCOUNTER — Ambulatory Visit: Payer: 59

## 2023-03-13 ENCOUNTER — Ambulatory Visit (INDEPENDENT_AMBULATORY_CARE_PROVIDER_SITE_OTHER): Payer: 59

## 2023-03-13 ENCOUNTER — Other Ambulatory Visit (HOSPITAL_COMMUNITY): Payer: Self-pay

## 2023-03-13 DIAGNOSIS — J4550 Severe persistent asthma, uncomplicated: Secondary | ICD-10-CM

## 2023-03-16 ENCOUNTER — Other Ambulatory Visit (HOSPITAL_COMMUNITY): Payer: Self-pay

## 2023-03-18 NOTE — Progress Notes (Signed)
Donna Park [name] [address] [place] [address] Phone: [phone]   Assessment and Plan:     1. Neck pain 2. Strain of right trapezius muscle, subsequent encounter 3. Strain of left trapezius muscle, subsequent encounter -Chronic with exacerbation, subsequent visit - Recurrence of multiple musculoskeletal complaints with most prominent being in neck radiating into bilateral trapezius and upper back - Patient previously had significant relief from trigger point injections.  Elected for repeat trigger point injections at today's visit.  Tolerated well per note below - Continue Tylenol as needed for pain relief -Continue HEP  Trigger Point Injection: After informed consent was obtained, skin cleaned with alcohol  prep.  A total of 10 trigger points identified along bilateral cervical paraspinal, thoracic paraspinal, trapezius, rhomboid.  Injections given over area of pain for total injection of 8 ml lidocaine 1% w/o epi and 2 mL Kenalog 40 mg/ML.  Patient had relief after the injection without side effects.  Pt given signs of infection to watch for.    Pertinent previous records reviewed include none   Follow Up: 2 weeks for reevaluation.  Could consider returning to OMT treatments at that time   Subjective:   I, Donna Park, am serving as a Neurosurgeon for Doctor Richardean Sale   Chief Complaint: left shoulder pain and tightness   HPI:  01/31/2022 Patient is a 52 year old female complaining of neck and shoulder pain. Patient states been going on for about 2 weeks has knots in her shoulders both neck and shoulders have been stiff , interested in OMT , knots in her upper trap , low back and her sciatic nerve on her right side flares up when she's having intercourse goes down her leg and upper her back    02/21/2022 Patient states that her neck and her shoulders are still bothering her after her adjustment she was very tight is  still tight in the upper back , still wants the adjustment will get a massage later tonight as well  , hip still hurts , her tailbone she woke up one morning with pain     03/06/2022 Patient states still has upper trap tightness, finder her self leaning on that left side    03/28/2022 Patient states that she is still tight time for an adjustment would like to discuss a lower spot for CSI    04/25/2022 Patient states that her neck is heavy, would like a referral for MRI, still neck tightness and pain,   05/23/2022 Patient states she is ready for the OMT but the left shoulder clavicle have been sore, neck and shoulder still hurts. Patient has had the MRI of the ankle this morning. Patient wanting to know if she can get another shoulder injection today    06/20/2022 Patient states that she is a little better decreased ROM but is going to PT , ankle feels better bought different shoes    08/08/2022  Patient states shoulder still hurts has been dry needling and want to talk talk about lidocaine again and then low back pain want referral for PT and sciatic pain    08/29/2022 Patient states wants OMT, knot still hurts    09/26/2022 Patient states that her shoulder still hurts , last week her bones were hurting , body is achy and painful    10/24/2022 Patient states that the bones and the back of her neck are getting tight and painful same spot as when she came in the  first time, also the upper trap knot coming back    11/21/2022 Patient states neck and,  shoulder are tight , stopped PT , needs  tune up    12/19/2022 Patient states she is hurting everywhere today , neck and shoulder are stiff, needs a good stretch today    01/16/2023 Patient states neck , low back and hips , has been able to work out, neck knots have gotten better but still tight    02/13/2023 Patient states neck is still in pain right shoulder is tight needs an adjustment    03/20/2023 Patient states neck and spine tightness  through thoracic , neck and shoulder decreased ROM,if possible injection or trigger points    Relevant Historical Information: History of migraines being treated with Botox injections Additional pertinent review of systems negative.   Current Outpatient Medications:    acetaminophen (TYLENOL) 500 MG tablet, Take by mouth., Disp: , Rfl:    acyclovir ointment (ZOVIRAX) 5 %, Apply onto the affected area every 3 hours., Disp: 15 g, Rfl: 1   albuterol (VENTOLIN HFA) 108 (90 Base) MCG/ACT inhaler, Inhale 1 - 2 puffs into the lungs every 6 hours as needed for wheezing or shortness of breath., Disp: 6.7 g, Rfl: 1   alendronate (FOSAMAX) 70 MG tablet, Take 1 tablet (70 mg total) by mouth every 7 (seven) days., Disp: 13 tablet, Rfl: 0   amitriptyline (ELAVIL) 10 MG tablet, Take 1 tablet (10 mg total) by mouth at bedtime., Disp: 90 tablet, Rfl: 1   azelastine (ASTELIN) 0.1 % nasal spray, Place 2 sprays into both nostrils 2 (two) times daily., Disp: 30 mL, Rfl: 5   Azelastine-Fluticasone (DYMISTA) 137-50 MCG/ACT SUSP, Place 2 sprays into both nostrils 2 (two) times daily as needed., Disp: 23 g, Rfl: 5   botulinum toxin Type A (BOTOX) 200 units injection, Provider to inject 155 units into the muscles of the head and neck every 12 weeks. Discard remainder., Disp: 1 each, Rfl: 3   botulinum toxin Type A (BOTOX) 200 units injection, Inject 155 units in neck and head muscles every 12 weeks Administrated by provider . Discard remaninig, Disp: 1 each, Rfl: 3   CALCIUM PO, Take 1 tablet by mouth 4 (four) times a week., Disp: , Rfl:    cetirizine (ZYRTEC) 10 MG tablet, Take 2 tablets (20 mg total) by mouth 2 (two) times daily., Disp: 360 tablet, Rfl: 1   Cyanocobalamin (NASCOBAL NA), Place 1 spray into the nose every Sunday., Disp: , Rfl:    diazepam (VALIUM) 10 MG tablet, Take 1 tablet (10 mg total) by mouth at bedtime as needed for sleep., Disp: 30 tablet, Rfl: 2   EPINEPHrine 0.3 mg/0.3 mL IJ SOAJ injection,  Inject 0.3 mg into the muscle as needed for anaphylaxis., Disp: 2 each, Rfl: 1   estradiol (ESTRACE) 2 MG tablet, Take 1 tablet (2 mg total) by mouth daily., Disp: 90 tablet, Rfl: 12   estradiol (ESTRACE) 2 MG tablet, Take 1 tablet (2 mg total) by mouth daily., Disp: 30 tablet, Rfl: 12   Estradiol Starter Pack (IMVEXXY STARTER PACK) 10 MCG INST, Insert 1 soft gel daily for 2 weeks. After this, insert 1 soft gel twice weekly, Disp: 18 each, Rfl: 1   fluconazole (DIFLUCAN) 150 MG tablet, Take 1 tablet (150 mg total) by mouth daily., Disp: 14 tablet, Rfl: 5   fluticasone (FLONASE) 50 MCG/ACT nasal spray, Place 2 sprays into both nostrils daily., Disp: 16 g, Rfl: 5   Fluticasone-Umeclidin-Vilant (  TRELEGY ELLIPTA) 200-62.5-25 MCG/ACT AEPB, Inhale 1 puff into the lungs daily., Disp: 60 each, Rfl: 5   Galcanezumab-gnlm (EMGALITY) 120 MG/ML SOAJ, Inject 120 mg into the skin every 30 (thirty) days., Disp: 3 mL, Rfl: 0   Hypertonic Nasal Wash (SINUS RINSE REFILL) PACK, Use one packet dissolved in water as needed. (Patient taking differently: Place 1 each into the nose in the morning and at bedtime.), Disp: 100 each, Rfl: 5   ibuprofen (ADVIL) 800 MG tablet, Take 1 tablet (800 mg total) by mouth 3 (three) times daily as needed (pain.)., Disp: 270 tablet, Rfl: 0   ipratropium (ATROVENT) 0.06 % nasal spray, Place 2 sprays into both nostrils 4 (four) times daily., Disp: 45 mL, Rfl: 3   linaclotide (LINZESS) 145 MCG CAPS capsule, Take 1 capsule (145 mcg total) by mouth daily before breakfast., Disp: 60 capsule, Rfl: 3   linaclotide (LINZESS) 290 MCG CAPS capsule, Take 1 capsule (290 mcg total) by mouth daily before breakfast., Disp: 90 capsule, Rfl: 1   meclizine (ANTIVERT) 25 MG tablet, Take 25 mg by mouth 3 (three) times daily as needed (dizziness/vertigo). , Disp: , Rfl:    meloxicam (MOBIC) 15 MG tablet, Take 1 tablet (15 mg total) by mouth daily., Disp: 30 tablet, Rfl: 0   MUCUS RELIEF ER 600 MG 12 hr tablet,  Take 600-1,200 mg by mouth 2 (two) times daily as needed (congestion). , Disp: , Rfl:    ondansetron (ZOFRAN-ODT) 4 MG disintegrating tablet, Dissolve 1-2 tablets (4-8 mg total) by mouth every 8 (eight) hours as needed. May take with Rizatriptan, Disp: 30 tablet, Rfl: 3   pantoprazole (PROTONIX) 40 MG tablet, Take 1 tablet (40 mg total) by mouth at bedtime., Disp: 90 tablet, Rfl: 3   Pediatric Multiple Vit-C-FA (MULTIVITAMIN ANIMAL SHAPES, WITH CA/FA,) with C & FA chewable tablet, Chew 2 tablets by mouth daily., Disp: , Rfl:    Rimegepant Sulfate (NURTEC) 75 MG TBDP, Dissolve 1 tablet by mouth daily as needed for migraines. Take as close to onset of migraine as possible. One daily maximum., Disp: 16 tablet, Rfl: 11   rizatriptan (MAXALT-MLT) 10 MG disintegrating tablet, Dissolve 1 tablet (10 mg total) by mouth as needed at onset of migraine. May take another tablet 2 hours later if needed, max 2 tablets in 24 hours., Disp: 27 tablet, Rfl: 3   simethicone (MYLICON) 80 MG chewable tablet, Chew 80 mg by mouth every 6 (six) hours as needed for flatulence., Disp: , Rfl:    tezepelumab-ekko (TEZSPIRE) 210 MG/1. syringe, Inject 1.91 mLs (210 mg total) into the skin every 28 (twenty-eight) days., Disp: 1.91 mL, Rfl: 11   tiZANidine (ZANAFLEX) 4 MG tablet, Take 1 tablet by mouth every 8  hours as needed., Disp: 10 tablet, Rfl: 0   valACYclovir (VALTREX) 500 MG tablet, Take 500 mg by mouth 2 (two) times daily., Disp: , Rfl:    Vibegron (GEMTESA) 75 MG TABS, Take 1 tablet by mouth daily., Disp: 30 tablet, Rfl: 11   Vibegron (GEMTESA) 75 MG TABS, Take 1 tablet by mouth daily., Disp: 90 tablet, Rfl: 4  Current Facility-Administered Medications:    tezepelumab-ekko (TEZSPIRE) 210 MG/1. syringe 210 mg, 210 mg, Subcutaneous, Q28 days, Alfonse Spruce, MD, 210 mg at 03/13/23 1417   Objective:     Vitals:   03/20/23 1018  BP: 110/78  Pulse: 90  SpO2: 98%  Weight: 179 lb (81.2 kg)  Height: 5\' 6"   (1.676 m)      Body  mass index is 28.89 kg/m.    Physical Exam:    Neck Exam: Cervical Spine- Posture normal Skin- normal, intact  Neuro:  Strength-  Right Left   Deltoid (C5) 5/5 5/5  Bicep/Brachioradialis (C5/6) 5/5  5/5  Wrist Extension (C6) 5/5 5/5  Tricep (C7) 5/5 5/5  Wrist Flexion (C7) 5/5 5/5  Grip (C8) 5/5 5/5  Finger Abduction (T1) 5/5 5/5   Sensation: intact to light touch in upper extremities bilaterally  Spurling's:  negative bilaterally Neck ROM: Full active ROM TTP: cervical spinous processes, and bilateral cervical paraspinal, thoracic paraspinal, trapezius, rhomboid   Electronically signed by:  Donna Park [name] 10:57 AM 03/20/23

## 2023-03-20 ENCOUNTER — Ambulatory Visit (INDEPENDENT_AMBULATORY_CARE_PROVIDER_SITE_OTHER): Payer: 59 | Admitting: Sports Medicine

## 2023-03-20 VITALS — BP 110/78 | HR 90 | Ht 66.0 in | Wt 179.0 lb

## 2023-03-20 DIAGNOSIS — M542 Cervicalgia: Secondary | ICD-10-CM

## 2023-03-20 DIAGNOSIS — S46812D Strain of other muscles, fascia and tendons at shoulder and upper arm level, left arm, subsequent encounter: Secondary | ICD-10-CM | POA: Diagnosis not present

## 2023-03-20 DIAGNOSIS — S46811D Strain of other muscles, fascia and tendons at shoulder and upper arm level, right arm, subsequent encounter: Secondary | ICD-10-CM | POA: Diagnosis not present

## 2023-03-20 NOTE — Patient Instructions (Signed)
Good to see you  2 week follow up for [facility]

## 2023-03-24 ENCOUNTER — Other Ambulatory Visit (HOSPITAL_COMMUNITY): Payer: Self-pay

## 2023-03-24 ENCOUNTER — Ambulatory Visit (INDEPENDENT_AMBULATORY_CARE_PROVIDER_SITE_OTHER): Payer: 59

## 2023-03-24 ENCOUNTER — Ambulatory Visit: Payer: 59 | Admitting: Adult Health

## 2023-03-24 ENCOUNTER — Encounter: Payer: Self-pay | Admitting: Adult Health

## 2023-03-24 ENCOUNTER — Other Ambulatory Visit: Payer: Self-pay

## 2023-03-24 ENCOUNTER — Telehealth (INDEPENDENT_AMBULATORY_CARE_PROVIDER_SITE_OTHER): Payer: 59 | Admitting: Adult Health

## 2023-03-24 VITALS — Ht 66.0 in | Wt 174.0 lb

## 2023-03-24 DIAGNOSIS — N3000 Acute cystitis without hematuria: Secondary | ICD-10-CM

## 2023-03-24 DIAGNOSIS — R300 Dysuria: Secondary | ICD-10-CM

## 2023-03-24 LAB — POCT URINALYSIS DIPSTICK
Bilirubin, UA: NEGATIVE
Blood, UA: NEGATIVE
Glucose, UA: NEGATIVE
Ketones, UA: NEGATIVE
Protein, UA: NEGATIVE
Spec Grav, UA: 1.015 (ref 1.010–1.025)
Urobilinogen, UA: 0.2 E.U./dL
pH, UA: 6 (ref 5.0–8.0)

## 2023-03-24 MED ORDER — CIPROFLOXACIN HCL 500 MG PO TABS
500.0000 mg | ORAL_TABLET | Freq: Two times a day (BID) | ORAL | 0 refills | Status: AC
  Filled 2023-03-24 (×2): qty 6, 3d supply, fill #0

## 2023-03-24 MED ORDER — MICONAZOLE NITRATE 2 % VA CREA
1.0000 | TOPICAL_CREAM | Freq: Every day | VAGINAL | 0 refills | Status: DC
  Filled 2023-03-24: qty 45, 30d supply, fill #0

## 2023-03-24 NOTE — Progress Notes (Signed)
Virtual Visit via Video Note  I connected with Donna Park on 03/24/23 at  1:00 PM EDT by a video enabled telemedicine application and verified that I am speaking with the correct person using two identifiers.  Location patient: home Location provider:work or home office Persons participating in the virtual visit: patient, provider  I discussed the limitations of evaluation and management by telemedicine and the availability of in person appointments. The patient expressed understanding and agreed to proceed.   HPI:  52 year old female who  has a past medical history of Allergy, Anxiety, Arthritis, Asthma, Chicken pox, Complication of anesthesia, Depression, DJD (degenerative joint disease), GERD (gastroesophageal reflux disease), H/O gastric bypass (2016), History of iron deficiency anemia, HSV infection, Migraines, Obese, Pneumonia, PONV (postoperative nausea and vomiting), Status post dilation of esophageal narrowing, and Varicose vein of leg.  He is being seen today for concern of a UTI. Her symptoms started roughly days ago. Symptoms include that of dysuria, urinary frequency, urgency and odorous urine  She did drop off a urine sample earlier today which showed + Leuks and nitrites.  She does report that this is her 5th UTI in the last few months - she has been treated outside of [facility]   ROS: See pertinent positives and negatives per HPI.  Past Medical History:  Diagnosis Date   Allergy    Anxiety    Arthritis    Asthma    Chicken pox    Complication of anesthesia    slow to wake up from anesthesia   Depression    DJD (degenerative joint disease)    GERD (gastroesophageal reflux disease)    resolved after gastric surgery   H/O gastric bypass 2016   History of iron deficiency anemia    HSV infection    Migraines    Obese    Pneumonia    Childhood history   PONV (postoperative nausea and vomiting)    Status post dilation of esophageal narrowing    Varicose  vein of leg     Past Surgical History:  Procedure Laterality Date   ANKLE FRACTURE SURGERY Left 03/08/2009   BREAST SURGERY Bilateral    Cyst Removal   ESOPHAGEAL MANOMETRY N/A 09/24/2022   Procedure: ESOPHAGEAL MANOMETRY (EM);  Surgeon: Shellia Cleverly, DO;  Location: [facility];  Service: Gastroenterology;  Laterality: N/A;   GASTRIC BYPASS  06/18/2015   LAPAROSCOPIC VAGINAL HYSTERECTOMY WITH SALPINGECTOMY Bilateral 05/15/2020   Procedure: LAPAROSCOPIC ASSISTED VAGINAL HYSTERECTOMY WITH SALPINGECTOMY;  Surgeon: Candice Camp, MD;  Location: [facility];  Service: Gynecology;  Laterality: Bilateral;  need bed   LASER ABLATION     Varicose veins   TONSILLECTOMY AND ADENOIDECTOMY  2001   TUBAL LIGATION  12/19/1999   UPPER GASTROINTESTINAL ENDOSCOPY     UPPER GI ENDOSCOPY     Uterine ablation  12/2018    Family History  Problem Relation Age of Onset   COPD Mother    Heart disease Mother    Alcohol abuse Mother    Depression Mother    Drug abuse Mother    High Cholesterol Mother    High blood pressure Mother    COPD Father    Aneurysm Father    Heart disease Father    Early death Father    Diabetes Father    Drug abuse Father    AAA (abdominal aortic aneurysm) Father        cause of death at 43   Arthritis Sister  Kidney disease Sister    Crohn's disease Sister    Ulcerative colitis Sister    Thyroid disease Sister    Kidney disease Brother    Kidney disease Maternal Aunt    Diabetes Paternal Aunt    Asthma Neg Hx    Allergic rhinitis Neg Hx    Colon cancer Neg Hx    Rectal cancer Neg Hx    Stomach cancer Neg Hx        Current Outpatient Medications:    acetaminophen (TYLENOL) 500 MG tablet, Take by mouth., Disp: , Rfl:    acyclovir ointment (ZOVIRAX) 5 %, Apply onto the affected area every 3 hours., Disp: 15 g, Rfl: 1   albuterol (VENTOLIN HFA) 108 (90 Base) MCG/ACT inhaler, Inhale 1 - 2 puffs into the lungs every 6 hours as needed for  wheezing or shortness of breath., Disp: 6.7 g, Rfl: 1   alendronate (FOSAMAX) 70 MG tablet, Take 1 tablet (70 mg total) by mouth every 7 (seven) days., Disp: 13 tablet, Rfl: 0   amitriptyline (ELAVIL) 10 MG tablet, Take 1 tablet (10 mg total) by mouth at bedtime., Disp: 90 tablet, Rfl: 1   azelastine (ASTELIN) 0.1 % nasal spray, Place 2 sprays into both nostrils 2 (two) times daily., Disp: 30 mL, Rfl: 5   Azelastine-Fluticasone (DYMISTA) 137-50 MCG/ACT SUSP, Place 2 sprays into both nostrils 2 (two) times daily as needed., Disp: 23 g, Rfl: 5   botulinum toxin Type A (BOTOX) 200 units injection, Provider to inject 155 units into the muscles of the head and neck every 12 weeks. Discard remainder., Disp: 1 each, Rfl: 3   botulinum toxin Type A (BOTOX) 200 units injection, Inject 155 units in neck and head muscles every 12 weeks Administrated by provider . Discard remaninig, Disp: 1 each, Rfl: 3   CALCIUM PO, Take 1 tablet by mouth 4 (four) times a week., Disp: , Rfl:    cetirizine (ZYRTEC) 10 MG tablet, Take 2 tablets (20 mg total) by mouth 2 (two) times daily., Disp: 360 tablet, Rfl: 1   Cyanocobalamin (NASCOBAL NA), Place 1 spray into the nose every Sunday., Disp: , Rfl:    diazepam (VALIUM) 10 MG tablet, Take 1 tablet (10 mg total) by mouth at bedtime as needed for sleep., Disp: 30 tablet, Rfl: 2   EPINEPHrine 0.3 mg/0.3 mL IJ SOAJ injection, Inject 0.3 mg into the muscle as needed for anaphylaxis., Disp: 2 each, Rfl: 1   estradiol (ESTRACE) 2 MG tablet, Take 1 tablet (2 mg total) by mouth daily., Disp: 90 tablet, Rfl: 12   estradiol (ESTRACE) 2 MG tablet, Take 1 tablet (2 mg total) by mouth daily., Disp: 30 tablet, Rfl: 12   Estradiol Starter Pack (IMVEXXY STARTER PACK) 10 MCG INST, Insert 1 soft gel daily for 2 weeks. After this, insert 1 soft gel twice weekly, Disp: 18 each, Rfl: 1   fluconazole (DIFLUCAN) 150 MG tablet, Take 1 tablet (150 mg total) by mouth daily., Disp: 14 tablet, Rfl: 5    fluticasone (FLONASE) 50 MCG/ACT nasal spray, Place 2 sprays into both nostrils daily., Disp: 16 g, Rfl: 5   Fluticasone-Umeclidin-Vilant (TRELEGY ELLIPTA) 200-62.5-25 MCG/ACT AEPB, Inhale 1 puff into the lungs daily., Disp: 60 each, Rfl: 5   Galcanezumab-gnlm (EMGALITY) 120 MG/ML SOAJ, Inject 120 mg into the skin every 30 (thirty) days., Disp: 3 mL, Rfl: 0   Hypertonic Nasal Wash (SINUS RINSE REFILL) PACK, Use one packet dissolved in water as needed. (Patient taking differently: Place  1 each into the nose in the morning and at bedtime.), Disp: 100 each, Rfl: 5   ibuprofen (ADVIL) 800 MG tablet, Take 1 tablet (800 mg total) by mouth 3 (three) times daily as needed (pain.)., Disp: 270 tablet, Rfl: 0   ipratropium (ATROVENT) 0.06 % nasal spray, Place 2 sprays into both nostrils 4 (four) times daily., Disp: 45 mL, Rfl: 3   linaclotide (LINZESS) 145 MCG CAPS capsule, Take 1 capsule (145 mcg total) by mouth daily before breakfast., Disp: 60 capsule, Rfl: 3   linaclotide (LINZESS) 290 MCG CAPS capsule, Take 1 capsule (290 mcg total) by mouth daily before breakfast., Disp: 90 capsule, Rfl: 1   meclizine (ANTIVERT) 25 MG tablet, Take 25 mg by mouth 3 (three) times daily as needed (dizziness/vertigo). , Disp: , Rfl:    meloxicam (MOBIC) 15 MG tablet, Take 1 tablet (15 mg total) by mouth daily., Disp: 30 tablet, Rfl: 0   MUCUS RELIEF ER 600 MG 12 hr tablet, Take 600-1,200 mg by mouth 2 (two) times daily as needed (congestion). , Disp: , Rfl:    ondansetron (ZOFRAN-ODT) 4 MG disintegrating tablet, Dissolve 1-2 tablets (4-8 mg total) by mouth every 8 (eight) hours as needed. May take with Rizatriptan, Disp: 30 tablet, Rfl: 3   pantoprazole (PROTONIX) 40 MG tablet, Take 1 tablet (40 mg total) by mouth at bedtime., Disp: 90 tablet, Rfl: 3   Pediatric Multiple Vit-C-FA (MULTIVITAMIN ANIMAL SHAPES, WITH CA/FA,) with C & FA chewable tablet, Chew 2 tablets by mouth daily., Disp: , Rfl:    Rimegepant Sulfate (NURTEC) 75  MG TBDP, Dissolve 1 tablet by mouth daily as needed for migraines. Take as close to onset of migraine as possible. One daily maximum., Disp: 16 tablet, Rfl: 11   rizatriptan (MAXALT-MLT) 10 MG disintegrating tablet, Dissolve 1 tablet (10 mg total) by mouth as needed at onset of migraine. May take another tablet 2 hours later if needed, max 2 tablets in 24 hours., Disp: 27 tablet, Rfl: 3   simethicone (MYLICON) 80 MG chewable tablet, Chew 80 mg by mouth every 6 (six) hours as needed for flatulence., Disp: , Rfl:    tezepelumab-ekko (TEZSPIRE) 210 MG/1. syringe, Inject 1.91 mLs (210 mg total) into the skin every 28 (twenty-eight) days., Disp: 1.91 mL, Rfl: 11   tiZANidine (ZANAFLEX) 4 MG tablet, Take 1 tablet by mouth every 8  hours as needed., Disp: 10 tablet, Rfl: 0   valACYclovir (VALTREX) 500 MG tablet, Take 500 mg by mouth 2 (two) times daily., Disp: , Rfl:    Vibegron (GEMTESA) 75 MG TABS, Take 1 tablet by mouth daily., Disp: 30 tablet, Rfl: 11   Vibegron (GEMTESA) 75 MG TABS, Take 1 tablet by mouth daily., Disp: 90 tablet, Rfl: 4  Current Facility-Administered Medications:    tezepelumab-ekko (TEZSPIRE) 210 MG/1. syringe 210 mg, 210 mg, Subcutaneous, Q28 days, Alfonse Spruce, MD, 210 mg at 03/13/23 1417  EXAM:  VITALS per patient if applicable:  GENERAL: alert, oriented, appears well and in no acute distress  HEENT: atraumatic, conjunttiva clear, no obvious abnormalities on inspection of external nose and ears  NECK: normal movements of the head and neck  LUNGS: on inspection no signs of respiratory distress, breathing rate appears normal, no obvious gross SOB, gasping or wheezing  CV: no obvious cyanosis  MS: moves all visible extremities without noticeable abnormality  PSYCH/NEURO: pleasant and cooperative, no obvious depression or anxiety, speech and thought processing grossly intact  ASSESSMENT AND PLAN:  Discussed the  following assessment and plan:  1. Acute  cystitis without hematuria - Allergies to sulfa and keflex.  - Culture sent.  - Referral to UroGYN due to frequent UTI's  - ciprofloxacin (CIPRO) 500 MG tablet; Take 1 tablet (500 mg total) by mouth 2 (two) times daily for 3 days.  Dispense: 6 tablet; Refill: 0 - Ambulatory referral to Urogynecology - miconazole (MONISTAT 7) 2 % vaginal cream; Place 1 Applicatorful vaginally at bedtime.  Dispense: 45 g; Refill: 0  Shirline Frees, NP     I discussed the assessment and treatment plan with the patient. The patient was provided an opportunity to ask questions and all were answered. The patient agreed with the plan and demonstrated an understanding of the instructions.   The patient was advised to call back or seek an in-person evaluation if the symptoms worsen or if the condition fails to improve as anticipated.   Shirline Frees, NP

## 2023-03-26 LAB — URINE CULTURE
MICRO NUMBER:: 14831376
SPECIMEN QUALITY:: ADEQUATE

## 2023-03-31 ENCOUNTER — Other Ambulatory Visit (HOSPITAL_COMMUNITY): Payer: Self-pay

## 2023-04-01 NOTE — Progress Notes (Deleted)
Donna Park D.[facility] [place] Phone: [phone]   Assessment and Plan:     There are no diagnoses linked to this encounter.  *** - Patient has received significant relief with OMT in the past.  Elects for repeat OMT today.  Tolerated well per note below. - Decision today to treat with OMT was based on Physical Exam   After verbal consent patient was treated with HVLA (high velocity low amplitude), ME (muscle energy), FPR (flex positional release), ST (soft tissue), PC/PD (Pelvic Compression/ Pelvic Decompression) techniques in cervical, rib, thoracic, lumbar, and pelvic areas. Patient tolerated the procedure well with improvement in symptoms.  Patient educated on potential side effects of soreness and recommended to rest, hydrate, and use Tylenol as needed for pain control.   Pertinent previous records reviewed include ***   Follow Up: ***     Subjective:   I, Donna Park, am serving as a Education administrator for Doctor Glennon Mac   Chief Complaint: left shoulder pain and tightness   HPI:  01/31/2022 Patient is a 52 year old female complaining of neck and shoulder pain. Patient states been going on for about 2 weeks has knots in her shoulders both neck and shoulders have been stiff , interested in OMT , knots in her upper trap , low back and her sciatic nerve on her right side flares up when she's having intercourse goes down her leg and upper her back    02/21/2022 Patient states that her neck and her shoulders are still bothering her after her adjustment she was very tight is still tight in the upper back , still wants the adjustment will get a massage later tonight as well  , hip still hurts , her tailbone she woke up one morning with pain     03/06/2022 Patient states still has upper trap tightness, finder her self leaning on that left side    03/28/2022 Patient states that she is still tight time for an adjustment would like  to discuss a lower spot for CSI    04/25/2022 Patient states that her neck is heavy, would like a referral for MRI, still neck tightness and pain,   05/23/2022 Patient states she is ready for the OMT but the left shoulder clavicle have been sore, neck and shoulder still hurts. Patient has had the MRI of the ankle this morning. Patient wanting to know if she can get another shoulder injection today    06/20/2022 Patient states that she is a little better decreased ROM but is going to PT , ankle feels better bought different shoes    08/08/2022  Patient states shoulder still hurts has been dry needling and want to talk talk about lidocaine again and then low back pain want referral for PT and sciatic pain    08/29/2022 Patient states wants OMT, knot still hurts    09/26/2022 Patient states that her shoulder still hurts , last week her bones were hurting , body is achy and painful    10/24/2022 Patient states that the bones and the back of her neck are getting tight and painful same spot as when she came in the first time, also the upper trap knot coming back    11/21/2022 Patient states neck and,  shoulder are tight , stopped PT , needs  tune up    12/19/2022 Patient states she is hurting everywhere today , neck and shoulder are stiff, needs a good stretch today  01/16/2023 Patient states neck , low back and hips , has been able to work out, neck knots have gotten better but still tight    02/13/2023 Patient states neck is still in pain right shoulder is tight needs an adjustment    03/20/2023 Patient states neck and spine tightness through thoracic , neck and shoulder decreased ROM,if possible injection or trigger points    04/02/2023 Patient states    Relevant Historical Information: History of migraines being treated with Botox injections  Additional pertinent review of systems negative.  Current Outpatient Medications  Medication Sig Dispense Refill   acetaminophen (TYLENOL)  500 MG tablet Take by mouth.     acyclovir ointment (ZOVIRAX) 5 % Apply onto the affected area every 3 hours. 15 g 1   albuterol (VENTOLIN HFA) 108 (90 Base) MCG/ACT inhaler Inhale 1 - 2 puffs into the lungs every 6 hours as needed for wheezing or shortness of breath. 6.7 g 1   alendronate (FOSAMAX) 70 MG tablet Take 1 tablet (70 mg total) by mouth every 7 (seven) days. 13 tablet 0   amitriptyline (ELAVIL) 10 MG tablet Take 1 tablet (10 mg total) by mouth at bedtime. 90 tablet 1   azelastine (ASTELIN) 0.1 % nasal spray Place 2 sprays into both nostrils 2 (two) times daily. 30 mL 5   Azelastine-Fluticasone (DYMISTA) 137-50 MCG/ACT SUSP Place 2 sprays into both nostrils 2 (two) times daily as needed. 23 g 5   botulinum toxin Type A (BOTOX) 200 units injection Provider to inject 155 units into the muscles of the head and neck every 12 weeks. Discard remainder. 1 each 3   botulinum toxin Type A (BOTOX) 200 units injection Inject 155 units in neck and head muscles every 12 weeks Administrated by provider . Discard remaninig 1 each 3   CALCIUM PO Take 1 tablet by mouth 4 (four) times a week.     cetirizine (ZYRTEC) 10 MG tablet Take 2 tablets (20 mg total) by mouth 2 (two) times daily. 360 tablet 1   Cyanocobalamin (NASCOBAL NA) Place 1 spray into the nose every Sunday.     diazepam (VALIUM) 10 MG tablet Take 1 tablet (10 mg total) by mouth at bedtime as needed for sleep. 30 tablet 2   EPINEPHrine 0.3 mg/0.3 mL IJ SOAJ injection Inject 0.3 mg into the muscle as needed for anaphylaxis. 2 each 1   estradiol (ESTRACE) 2 MG tablet Take 1 tablet (2 mg total) by mouth daily. 90 tablet 12   estradiol (ESTRACE) 2 MG tablet Take 1 tablet (2 mg total) by mouth daily. 30 tablet 12   Estradiol Starter Pack (IMVEXXY STARTER PACK) 10 MCG INST Insert 1 soft gel daily for 2 weeks. After this, insert 1 soft gel twice weekly 18 each 1   fluconazole (DIFLUCAN) 150 MG tablet Take 1 tablet (150 mg total) by mouth daily. 14  tablet 5   fluticasone (FLONASE) 50 MCG/ACT nasal spray Place 2 sprays into both nostrils daily. 16 g 5   Fluticasone-Umeclidin-Vilant (TRELEGY ELLIPTA) 200-62.5-25 MCG/ACT AEPB Inhale 1 puff into the lungs daily. 60 each 5   Galcanezumab-gnlm (EMGALITY) 120 MG/ML SOAJ Inject 120 mg into the skin every 30 (thirty) days. 3 mL 0   Hypertonic Nasal Wash (SINUS RINSE REFILL) PACK Use one packet dissolved in water as needed. (Patient taking differently: Place 1 each into the nose in the morning and at bedtime.) 100 each 5   ibuprofen (ADVIL) 800 MG tablet Take 1 tablet (800 mg  total) by mouth 3 (three) times daily as needed (pain.). 270 tablet 0   ipratropium (ATROVENT) 0.06 % nasal spray Place 2 sprays into both nostrils 4 (four) times daily. 45 mL 3   linaclotide (LINZESS) 145 MCG CAPS capsule Take 1 capsule (145 mcg total) by mouth daily before breakfast. 60 capsule 3   linaclotide (LINZESS) 290 MCG CAPS capsule Take 1 capsule (290 mcg total) by mouth daily before breakfast. 90 capsule 1   meclizine (ANTIVERT) 25 MG tablet Take 25 mg by mouth 3 (three) times daily as needed (dizziness/vertigo).      meloxicam (MOBIC) 15 MG tablet Take 1 tablet (15 mg total) by mouth daily. 30 tablet 0   miconazole (MONISTAT 7) 2 % vaginal cream Place 1 Applicatorful vaginally at bedtime. 45 g 0   MUCUS RELIEF ER 600 MG 12 hr tablet Take 600-1,200 mg by mouth 2 (two) times daily as needed (congestion).      ondansetron (ZOFRAN-ODT) 4 MG disintegrating tablet Dissolve 1-2 tablets (4-8 mg total) by mouth every 8 (eight) hours as needed. May take with Rizatriptan 30 tablet 3   pantoprazole (PROTONIX) 40 MG tablet Take 1 tablet (40 mg total) by mouth at bedtime. 90 tablet 3   Pediatric Multiple Vit-C-FA (MULTIVITAMIN ANIMAL SHAPES, WITH CA/FA,) with C & FA chewable tablet Chew 2 tablets by mouth daily.     Rimegepant Sulfate (NURTEC) 75 MG TBDP Dissolve 1 tablet by mouth daily as needed for migraines. Take as close to onset  of migraine as possible. One daily maximum. 16 tablet 11   rizatriptan (MAXALT-MLT) 10 MG disintegrating tablet Dissolve 1 tablet (10 mg total) by mouth as needed at onset of migraine. May take another tablet 2 hours later if needed, max 2 tablets in 24 hours. 27 tablet 3   simethicone (MYLICON) 80 MG chewable tablet Chew 80 mg by mouth every 6 (six) hours as needed for flatulence.     tezepelumab-ekko (TEZSPIRE) 210 MG/1. syringe Inject 1.91 mLs (210 mg total) into the skin every 28 (twenty-eight) days. 1.91 mL 11   tiZANidine (ZANAFLEX) 4 MG tablet Take 1 tablet by mouth every 8  hours as needed. 10 tablet 0   valACYclovir (VALTREX) 500 MG tablet Take 500 mg by mouth 2 (two) times daily.     Vibegron (GEMTESA) 75 MG TABS Take 1 tablet by mouth daily. 30 tablet 11   Vibegron (GEMTESA) 75 MG TABS Take 1 tablet by mouth daily. 90 tablet 4   Current Facility-Administered Medications  Medication Dose Route Frequency Provider Last Rate Last Admin   tezepelumab-ekko (TEZSPIRE) 210 MG/1. syringe 210 mg  210 mg Subcutaneous Q28 days Alfonse Spruce, MD   210 mg at 03/13/23 1417      Objective:     There were no vitals filed for this visit.    There is no height or weight on file to calculate BMI.    Physical Exam:     General: Well-appearing, cooperative, sitting comfortably in no acute distress.   OMT Physical Exam:  ASIS Compression Test: Positive Right Cervical: TTP paraspinal, *** Rib: Bilateral elevated first rib with TTP Thoracic: TTP paraspinal,*** Lumbar: TTP paraspinal,*** Pelvis: Right anterior innominate  Electronically signed by:  Aleen Sells D.[name] 12:10 PM 04/01/23

## 2023-04-02 ENCOUNTER — Ambulatory Visit: Payer: 59 | Admitting: Sports Medicine

## 2023-04-03 ENCOUNTER — Telehealth: Payer: Self-pay | Admitting: Pharmacist

## 2023-04-03 NOTE — Telephone Encounter (Signed)
Called patient to schedule an appointment for the [facility]. I was unable to reach the patient so I left a HIPAA-compliant message requesting that the patient return my call.   Luke Van Ausdall, PharmD, BCACP, CPP Clinical Pharmacist [facility] [phone]  

## 2023-04-09 ENCOUNTER — Other Ambulatory Visit (HOSPITAL_COMMUNITY): Payer: Self-pay

## 2023-04-09 ENCOUNTER — Ambulatory Visit (INDEPENDENT_AMBULATORY_CARE_PROVIDER_SITE_OTHER): Payer: 59 | Admitting: Sports Medicine

## 2023-04-09 VITALS — BP 122/80 | HR 95 | Ht 66.0 in | Wt 180.0 lb

## 2023-04-09 DIAGNOSIS — M9903 Segmental and somatic dysfunction of lumbar region: Secondary | ICD-10-CM | POA: Diagnosis not present

## 2023-04-09 DIAGNOSIS — M542 Cervicalgia: Secondary | ICD-10-CM

## 2023-04-09 DIAGNOSIS — G8929 Other chronic pain: Secondary | ICD-10-CM | POA: Diagnosis not present

## 2023-04-09 DIAGNOSIS — M9902 Segmental and somatic dysfunction of thoracic region: Secondary | ICD-10-CM | POA: Diagnosis not present

## 2023-04-09 DIAGNOSIS — M9908 Segmental and somatic dysfunction of rib cage: Secondary | ICD-10-CM

## 2023-04-09 DIAGNOSIS — M9905 Segmental and somatic dysfunction of pelvic region: Secondary | ICD-10-CM | POA: Diagnosis not present

## 2023-04-09 DIAGNOSIS — M9901 Segmental and somatic dysfunction of cervical region: Secondary | ICD-10-CM | POA: Diagnosis not present

## 2023-04-09 DIAGNOSIS — M545 Low back pain, unspecified: Secondary | ICD-10-CM | POA: Diagnosis not present

## 2023-04-09 NOTE — Progress Notes (Signed)
Aleen Sells D.[name] [address] [place] [address] Phone: [phone]   Assessment and Plan:     1. Neck pain 2. Chronic bilateral low back pain without sciatica 3. Somatic dysfunction of cervical region 4. Somatic dysfunction of thoracic region 5. Somatic dysfunction of lumbar region 6. Somatic dysfunction of pelvic region 7. Somatic dysfunction of rib region  -Chronic with exacerbation, subsequent visit - Overall moderate improvement in upper back pain after trigger point injections at previous office visit, however patient's had recurrence of multiple musculoskeletal complaints including chronic neck pain and low back pain without radicular symptoms - Continue HEP - Patient has received significant relief with OMT in the past.  Elects for repeat OMT today.  Tolerated well per note below. - Decision today to treat with OMT was based on Physical Exam   After verbal consent patient was treated with HVLA (high velocity low amplitude), ME (muscle energy), FPR (flex positional release), ST (soft tissue), PC/PD (Pelvic Compression/ Pelvic Decompression) techniques in cervical, rib, thoracic, lumbar, and pelvic areas. Patient tolerated the procedure well with improvement in symptoms.  Patient educated on potential side effects of soreness and recommended to rest, hydrate, and use Tylenol as needed for pain control.   Pertinent previous records reviewed include none   Follow Up: 3 weeks for reevaluation.  Could consider repeat OMT versus trigger point ejections based on presentation   Subjective:   I, [name], am serving as a Neurosurgeon for Doctor Richardean Sale   Chief Complaint: left shoulder pain and tightness   HPI:  01/31/2022 Patient is a 52 year old female complaining of neck and shoulder pain. Patient states been going on for about 2 weeks has knots in her shoulders both neck and shoulders have been stiff , interested in OMT , knots in  her upper trap , low back and her sciatic nerve on her right side flares up when she's having intercourse goes down her leg and upper her back    02/21/2022 Patient states that her neck and her shoulders are still bothering her after her adjustment she was very tight is still tight in the upper back , still wants the adjustment will get a massage later tonight as well  , hip still hurts , her tailbone she woke up one morning with pain     03/06/2022 Patient states still has upper trap tightness, finder her self leaning on that left side    03/28/2022 Patient states that she is still tight time for an adjustment would like to discuss a lower spot for CSI    04/25/2022 Patient states that her neck is heavy, would like a referral for MRI, still neck tightness and pain,   05/23/2022 Patient states she is ready for the OMT but the left shoulder clavicle have been sore, neck and shoulder still hurts. Patient has had the MRI of the ankle this morning. Patient wanting to know if she can get another shoulder injection today    06/20/2022 Patient states that she is a little better decreased ROM but is going to PT , ankle feels better bought different shoes    08/08/2022  Patient states shoulder still hurts has been dry needling and want to talk talk about lidocaine again and then low back pain want referral for PT and sciatic pain    08/29/2022 Patient states wants OMT, knot still hurts    09/26/2022 Patient states that her shoulder still hurts , last week her  bones were hurting , body is achy and painful    10/24/2022 Patient states that the bones and the back of her neck are getting tight and painful same spot as when she came in the first time, also the upper trap knot coming back    11/21/2022 Patient states neck and,  shoulder are tight , stopped PT , needs  tune up    12/19/2022 Patient states she is hurting everywhere today , neck and shoulder are stiff, needs a good stretch today     01/16/2023 Patient states neck , low back and hips , has been able to work out, neck knots have gotten better but still tight    02/13/2023 Patient states neck is still in pain right shoulder is tight needs an adjustment    03/20/2023 Patient states neck and spine tightness through thoracic , neck and shoulder decreased ROM,if possible injection or trigger points    04/09/2023 Patient states she has a migraine, back feels better from trigger points , thoracic and low back adjustment     Relevant Historical Information: History of migraines being treated with Botox injections  Additional pertinent review of systems negative.  Current Outpatient Medications  Medication Sig Dispense Refill   acetaminophen (TYLENOL) 500 MG tablet Take by mouth.     acyclovir ointment (ZOVIRAX) 5 % Apply onto the affected area every 3 hours. 15 g 1   albuterol (VENTOLIN HFA) 108 (90 Base) MCG/ACT inhaler Inhale 1 - 2 puffs into the lungs every 6 hours as needed for wheezing or shortness of breath. 6.7 g 1   alendronate (FOSAMAX) 70 MG tablet Take 1 tablet (70 mg total) by mouth every 7 (seven) days. 13 tablet 0   amitriptyline (ELAVIL) 10 MG tablet Take 1 tablet (10 mg total) by mouth at bedtime. 90 tablet 1   azelastine (ASTELIN) 0.1 % nasal spray Place 2 sprays into both nostrils 2 (two) times daily. 30 mL 5   Azelastine-Fluticasone (DYMISTA) 137-50 MCG/ACT SUSP Place 2 sprays into both nostrils 2 (two) times daily as needed. 23 g 5   botulinum toxin Type A (BOTOX) 200 units injection Provider to inject 155 units into the muscles of the head and neck every 12 weeks. Discard remainder. 1 each 3   botulinum toxin Type A (BOTOX) 200 units injection Inject 155 units in neck and head muscles every 12 weeks Administrated by provider . Discard remaninig 1 each 3   CALCIUM PO Take 1 tablet by mouth 4 (four) times a week.     cetirizine (ZYRTEC) 10 MG tablet Take 2 tablets (20 mg total) by mouth 2 (two) times daily. 360  tablet 1   Cyanocobalamin (NASCOBAL NA) Place 1 spray into the nose every Sunday.     diazepam (VALIUM) 10 MG tablet Take 1 tablet (10 mg total) by mouth at bedtime as needed for sleep. 30 tablet 2   EPINEPHrine 0.3 mg/0.3 mL IJ SOAJ injection Inject 0.3 mg into the muscle as needed for anaphylaxis. 2 each 1   estradiol (ESTRACE) 2 MG tablet Take 1 tablet (2 mg total) by mouth daily. 90 tablet 12   estradiol (ESTRACE) 2 MG tablet Take 1 tablet (2 mg total) by mouth daily. 30 tablet 12   Estradiol Starter Pack (IMVEXXY STARTER PACK) 10 MCG INST Insert 1 soft gel daily for 2 weeks. After this, insert 1 soft gel twice weekly 18 each 1   fluconazole (DIFLUCAN) 150 MG tablet Take 1 tablet (150 mg total) by  mouth daily. 14 tablet 5   fluticasone (FLONASE) 50 MCG/ACT nasal spray Place 2 sprays into both nostrils daily. 16 g 5   Fluticasone-Umeclidin-Vilant (TRELEGY ELLIPTA) 200-62.5-25 MCG/ACT AEPB Inhale 1 puff into the lungs daily. 60 each 5   Galcanezumab-gnlm (EMGALITY) 120 MG/ML SOAJ Inject 120 mg into the skin every 30 (thirty) days. 3 mL 0   Hypertonic Nasal Wash (SINUS RINSE REFILL) PACK Use one packet dissolved in water as needed. (Patient taking differently: Place 1 each into the nose in the morning and at bedtime.) 100 each 5   ibuprofen (ADVIL) 800 MG tablet Take 1 tablet (800 mg total) by mouth 3 (three) times daily as needed (pain.). 270 tablet 0   ipratropium (ATROVENT) 0.06 % nasal spray Place 2 sprays into both nostrils 4 (four) times daily. 45 mL 3   linaclotide (LINZESS) 145 MCG CAPS capsule Take 1 capsule (145 mcg total) by mouth daily before breakfast. 60 capsule 3   linaclotide (LINZESS) 290 MCG CAPS capsule Take 1 capsule (290 mcg total) by mouth daily before breakfast. 90 capsule 1   meclizine (ANTIVERT) 25 MG tablet Take 25 mg by mouth 3 (three) times daily as needed (dizziness/vertigo).      meloxicam (MOBIC) 15 MG tablet Take 1 tablet (15 mg total) by mouth daily. 30 tablet 0    miconazole (MONISTAT 7) 2 % vaginal cream Place 1 Applicatorful vaginally at bedtime. 45 g 0   MUCUS RELIEF ER 600 MG 12 hr tablet Take 600-1,200 mg by mouth 2 (two) times daily as needed (congestion).      ondansetron (ZOFRAN-ODT) 4 MG disintegrating tablet Dissolve 1-2 tablets (4-8 mg total) by mouth every 8 (eight) hours as needed. May take with Rizatriptan 30 tablet 3   pantoprazole (PROTONIX) 40 MG tablet Take 1 tablet (40 mg total) by mouth at bedtime. 90 tablet 3   Pediatric Multiple Vit-C-FA (MULTIVITAMIN ANIMAL SHAPES, WITH CA/FA,) with C & FA chewable tablet Chew 2 tablets by mouth daily.     Rimegepant Sulfate (NURTEC) 75 MG TBDP Dissolve 1 tablet by mouth daily as needed for migraines. Take as close to onset of migraine as possible. One daily maximum. 16 tablet 11   rizatriptan (MAXALT-MLT) 10 MG disintegrating tablet Dissolve 1 tablet (10 mg total) by mouth as needed at onset of migraine. May take another tablet 2 hours later if needed, max 2 tablets in 24 hours. 27 tablet 3   simethicone (MYLICON) 80 MG chewable tablet Chew 80 mg by mouth every 6 (six) hours as needed for flatulence.     tezepelumab-ekko (TEZSPIRE) 210 MG/1. syringe Inject 1.91 mLs (210 mg total) into the skin every 28 (twenty-eight) days. 1.91 mL 11   tiZANidine (ZANAFLEX) 4 MG tablet Take 1 tablet by mouth every 8  hours as needed. 10 tablet 0   valACYclovir (VALTREX) 500 MG tablet Take 500 mg by mouth 2 (two) times daily.     Vibegron (GEMTESA) 75 MG TABS Take 1 tablet by mouth daily. 30 tablet 11   Vibegron (GEMTESA) 75 MG TABS Take 1 tablet by mouth daily. 90 tablet 4   Current Facility-Administered Medications  Medication Dose Route Frequency Provider Last Rate Last Admin   tezepelumab-ekko (TEZSPIRE) 210 MG/1. syringe 210 mg  210 mg Subcutaneous Q28 days Alfonse Spruce, MD   210 mg at 03/13/23 1417      Objective:     Vitals:   04/09/23 1126  BP: 122/80  Pulse: 95  SpO2: 97%  Weight: 180  lb (81.6 kg)  Height: 5\' 6"  (1.676 m)      Body mass index is 29.05 kg/m.    Physical Exam:     General: Well-appearing, cooperative, sitting comfortably in no acute distress.   OMT Physical Exam:  ASIS Compression Test: Positive Right Cervical: TTP paraspinal, C5-7 RLSL Rib: Bilateral elevated first rib with TTP Thoracic: TTP paraspinal, T3-5 RRSL Lumbar: TTP paraspinal, L1-3 RRSL, L5 RL Pelvis: Right anterior innominate  Electronically signed by:  Aleen Sells D.[name] 11:46 AM 04/09/23

## 2023-04-09 NOTE — Progress Notes (Deleted)
Donna Park D.[facility] Phone: [phone]   Assessment and Plan:     There are no diagnoses linked to this encounter.  *** - Patient has received significant relief with OMT in the past.  Elects for repeat OMT today.  Tolerated well per note below. - Decision today to treat with OMT was based on Physical Exam   After verbal consent patient was treated with HVLA (high velocity low amplitude), ME (muscle energy), FPR (flex positional release), ST (soft tissue), PC/PD (Pelvic Compression/ Pelvic Decompression) techniques in cervical, rib, thoracic, lumbar, and pelvic areas. Patient tolerated the procedure well with improvement in symptoms.  Patient educated on potential side effects of soreness and recommended to rest, hydrate, and use Tylenol as needed for pain control.   Pertinent previous records reviewed include ***   Follow Up: ***     Subjective:   I, Donna Park, am serving as a Education administrator for Doctor Glennon Mac   Chief Complaint: left shoulder pain and tightness   HPI:  01/31/2022 Patient is a 52 year old female complaining of neck and shoulder pain. Patient states been going on for about 2 weeks has knots in her shoulders both neck and shoulders have been stiff , interested in OMT , knots in her upper trap , low back and her sciatic nerve on her right side flares up when she's having intercourse goes down her leg and upper her back    02/21/2022 Patient states that her neck and her shoulders are still bothering her after her adjustment she was very tight is still tight in the upper back , still wants the adjustment will get a massage later tonight as well  , hip still hurts , her tailbone she woke up one morning with pain     03/06/2022 Patient states still has upper trap tightness, finder her self leaning on that left side    03/28/2022 Patient states that she is still tight time for an adjustment would like  to discuss a lower spot for CSI    04/25/2022 Patient states that her neck is heavy, would like a referral for MRI, still neck tightness and pain,   05/23/2022 Patient states she is ready for the OMT but the left shoulder clavicle have been sore, neck and shoulder still hurts. Patient has had the MRI of the ankle this morning. Patient wanting to know if she can get another shoulder injection today    06/20/2022 Patient states that she is a little better decreased ROM but is going to PT , ankle feels better bought different shoes    08/08/2022  Patient states shoulder still hurts has been dry needling and want to talk talk about lidocaine again and then low back pain want referral for PT and sciatic pain    08/29/2022 Patient states wants OMT, knot still hurts    09/26/2022 Patient states that her shoulder still hurts , last week her bones were hurting , body is achy and painful    10/24/2022 Patient states that the bones and the back of her neck are getting tight and painful same spot as when she came in the first time, also the upper trap knot coming back    11/21/2022 Patient states neck and,  shoulder are tight , stopped PT , needs  tune up    12/19/2022 Patient states she is hurting everywhere today , neck and shoulder are stiff, needs a good stretch today  01/16/2023 Patient states neck , low back and hips , has been able to work out, neck knots have gotten better but still tight    02/13/2023 Patient states neck is still in pain right shoulder is tight needs an adjustment    03/20/2023 Patient states neck and spine tightness through thoracic , neck and shoulder decreased ROM,if possible injection or trigger points    04/10/2023 Patient states    Relevant Historical Information: History of migraines being treated with Botox injections  Additional pertinent review of systems negative.  Current Outpatient Medications  Medication Sig Dispense Refill   acetaminophen (TYLENOL) 500  MG tablet Take by mouth.     acyclovir ointment (ZOVIRAX) 5 % Apply onto the affected area every 3 hours. 15 g 1   albuterol (VENTOLIN HFA) 108 (90 Base) MCG/ACT inhaler Inhale 1 - 2 puffs into the lungs every 6 hours as needed for wheezing or shortness of breath. 6.7 g 1   alendronate (FOSAMAX) 70 MG tablet Take 1 tablet (70 mg total) by mouth every 7 (seven) days. 13 tablet 0   amitriptyline (ELAVIL) 10 MG tablet Take 1 tablet (10 mg total) by mouth at bedtime. 90 tablet 1   azelastine (ASTELIN) 0.1 % nasal spray Place 2 sprays into both nostrils 2 (two) times daily. 30 mL 5   Azelastine-Fluticasone (DYMISTA) 137-50 MCG/ACT SUSP Place 2 sprays into both nostrils 2 (two) times daily as needed. 23 g 5   botulinum toxin Type A (BOTOX) 200 units injection Provider to inject 155 units into the muscles of the head and neck every 12 weeks. Discard remainder. 1 each 3   botulinum toxin Type A (BOTOX) 200 units injection Inject 155 units in neck and head muscles every 12 weeks Administrated by provider . Discard remaninig 1 each 3   CALCIUM PO Take 1 tablet by mouth 4 (four) times a week.     cetirizine (ZYRTEC) 10 MG tablet Take 2 tablets (20 mg total) by mouth 2 (two) times daily. 360 tablet 1   Cyanocobalamin (NASCOBAL NA) Place 1 spray into the nose every Sunday.     diazepam (VALIUM) 10 MG tablet Take 1 tablet (10 mg total) by mouth at bedtime as needed for sleep. 30 tablet 2   EPINEPHrine 0.3 mg/0.3 mL IJ SOAJ injection Inject 0.3 mg into the muscle as needed for anaphylaxis. 2 each 1   estradiol (ESTRACE) 2 MG tablet Take 1 tablet (2 mg total) by mouth daily. 90 tablet 12   estradiol (ESTRACE) 2 MG tablet Take 1 tablet (2 mg total) by mouth daily. 30 tablet 12   Estradiol Starter Pack (IMVEXXY STARTER PACK) 10 MCG INST Insert 1 soft gel daily for 2 weeks. After this, insert 1 soft gel twice weekly 18 each 1   fluconazole (DIFLUCAN) 150 MG tablet Take 1 tablet (150 mg total) by mouth daily. 14 tablet  5   fluticasone (FLONASE) 50 MCG/ACT nasal spray Place 2 sprays into both nostrils daily. 16 g 5   Fluticasone-Umeclidin-Vilant (TRELEGY ELLIPTA) 200-62.5-25 MCG/ACT AEPB Inhale 1 puff into the lungs daily. 60 each 5   Galcanezumab-gnlm (EMGALITY) 120 MG/ML SOAJ Inject 120 mg into the skin every 30 (thirty) days. 3 mL 0   Hypertonic Nasal Wash (SINUS RINSE REFILL) PACK Use one packet dissolved in water as needed. (Patient taking differently: Place 1 each into the nose in the morning and at bedtime.) 100 each 5   ibuprofen (ADVIL) 800 MG tablet Take 1 tablet (800 mg  total) by mouth 3 (three) times daily as needed (pain.). 270 tablet 0   ipratropium (ATROVENT) 0.06 % nasal spray Place 2 sprays into both nostrils 4 (four) times daily. 45 mL 3   linaclotide (LINZESS) 145 MCG CAPS capsule Take 1 capsule (145 mcg total) by mouth daily before breakfast. 60 capsule 3   linaclotide (LINZESS) 290 MCG CAPS capsule Take 1 capsule (290 mcg total) by mouth daily before breakfast. 90 capsule 1   meclizine (ANTIVERT) 25 MG tablet Take 25 mg by mouth 3 (three) times daily as needed (dizziness/vertigo).      meloxicam (MOBIC) 15 MG tablet Take 1 tablet (15 mg total) by mouth daily. 30 tablet 0   miconazole (MONISTAT 7) 2 % vaginal cream Place 1 Applicatorful vaginally at bedtime. 45 g 0   MUCUS RELIEF ER 600 MG 12 hr tablet Take 600-1,200 mg by mouth 2 (two) times daily as needed (congestion).      ondansetron (ZOFRAN-ODT) 4 MG disintegrating tablet Dissolve 1-2 tablets (4-8 mg total) by mouth every 8 (eight) hours as needed. May take with Rizatriptan 30 tablet 3   pantoprazole (PROTONIX) 40 MG tablet Take 1 tablet (40 mg total) by mouth at bedtime. 90 tablet 3   Pediatric Multiple Vit-C-FA (MULTIVITAMIN ANIMAL SHAPES, WITH CA/FA,) with C & FA chewable tablet Chew 2 tablets by mouth daily.     Rimegepant Sulfate (NURTEC) 75 MG TBDP Dissolve 1 tablet by mouth daily as needed for migraines. Take as close to onset of  migraine as possible. One daily maximum. 16 tablet 11   rizatriptan (MAXALT-MLT) 10 MG disintegrating tablet Dissolve 1 tablet (10 mg total) by mouth as needed at onset of migraine. May take another tablet 2 hours later if needed, max 2 tablets in 24 hours. 27 tablet 3   simethicone (MYLICON) 80 MG chewable tablet Chew 80 mg by mouth every 6 (six) hours as needed for flatulence.     tezepelumab-ekko (TEZSPIRE) 210 MG/1. syringe Inject 1.91 mLs (210 mg total) into the skin every 28 (twenty-eight) days. 1.91 mL 11   tiZANidine (ZANAFLEX) 4 MG tablet Take 1 tablet by mouth every 8  hours as needed. 10 tablet 0   valACYclovir (VALTREX) 500 MG tablet Take 500 mg by mouth 2 (two) times daily.     Vibegron (GEMTESA) 75 MG TABS Take 1 tablet by mouth daily. 30 tablet 11   Vibegron (GEMTESA) 75 MG TABS Take 1 tablet by mouth daily. 90 tablet 4   Current Facility-Administered Medications  Medication Dose Route Frequency Provider Last Rate Last Admin   tezepelumab-ekko (TEZSPIRE) 210 MG/1. syringe 210 mg  210 mg Subcutaneous Q28 days Alfonse Spruce, MD   210 mg at 03/13/23 1417      Objective:     There were no vitals filed for this visit.    There is no height or weight on file to calculate BMI.    Physical Exam:     General: Well-appearing, cooperative, sitting comfortably in no acute distress.   OMT Physical Exam:  ASIS Compression Test: Positive Right Cervical: TTP paraspinal, *** Rib: Bilateral elevated first rib with TTP Thoracic: TTP paraspinal,*** Lumbar: TTP paraspinal,*** Pelvis: Right anterior innominate  Electronically signed by:  Aleen Sells D.[name] 7:19 AM 04/09/23

## 2023-04-09 NOTE — Patient Instructions (Signed)
Good to see you   

## 2023-04-10 ENCOUNTER — Ambulatory Visit: Payer: 59 | Admitting: Sports Medicine

## 2023-04-10 ENCOUNTER — Ambulatory Visit (INDEPENDENT_AMBULATORY_CARE_PROVIDER_SITE_OTHER): Payer: 59

## 2023-04-10 DIAGNOSIS — J4550 Severe persistent asthma, uncomplicated: Secondary | ICD-10-CM | POA: Diagnosis not present

## 2023-04-13 ENCOUNTER — Other Ambulatory Visit: Payer: Self-pay

## 2023-04-17 ENCOUNTER — Encounter: Payer: 59 | Admitting: Adult Health

## 2023-04-21 ENCOUNTER — Other Ambulatory Visit (HOSPITAL_COMMUNITY): Payer: Self-pay

## 2023-04-21 ENCOUNTER — Ambulatory Visit (INDEPENDENT_AMBULATORY_CARE_PROVIDER_SITE_OTHER): Payer: 59 | Admitting: Neurology

## 2023-04-21 DIAGNOSIS — G43009 Migraine without aura, not intractable, without status migrainosus: Secondary | ICD-10-CM

## 2023-04-21 DIAGNOSIS — G43709 Chronic migraine without aura, not intractable, without status migrainosus: Secondary | ICD-10-CM | POA: Diagnosis not present

## 2023-04-21 MED ORDER — ONABOTULINUMTOXINA 200 UNITS IJ SOLR
155.0000 [IU] | Freq: Once | INTRAMUSCULAR | Status: AC
  Administered 2023-04-21: 155 [IU] via INTRAMUSCULAR

## 2023-04-21 MED ORDER — ONDANSETRON 4 MG PO TBDP
4.0000 mg | ORAL_TABLET | Freq: Three times a day (TID) | ORAL | 3 refills | Status: DC | PRN
  Filled 2023-04-21 – 2023-05-08 (×2): qty 90, 15d supply, fill #0
  Filled 2023-06-25: qty 90, 15d supply, fill #1
  Filled 2023-10-09: qty 90, 15d supply, fill #2
  Filled 2023-11-12: qty 90, 15d supply, fill #3

## 2023-04-21 MED ORDER — RIZATRIPTAN BENZOATE 10 MG PO TBDP
10.0000 mg | ORAL_TABLET | ORAL | 3 refills | Status: DC | PRN
  Filled 2023-04-21 – 2023-05-08 (×2): qty 27, 90d supply, fill #0
  Filled 2023-06-25 – 2023-10-09 (×2): qty 27, 90d supply, fill #1
  Filled 2023-11-12: qty 27, 90d supply, fill #2

## 2023-04-21 MED ORDER — NURTEC 75 MG PO TBDP
75.0000 mg | ORAL_TABLET | Freq: Every day | ORAL | 11 refills | Status: DC | PRN
  Filled 2023-04-21: qty 16, 16d supply, fill #0
  Filled 2023-05-08: qty 16, 30d supply, fill #0
  Filled 2023-06-25: qty 16, 30d supply, fill #1
  Filled 2023-10-09: qty 16, 30d supply, fill #2
  Filled 2023-11-12: qty 16, 30d supply, fill #3

## 2023-04-21 NOTE — Progress Notes (Signed)
Botox- 200 units x 1 vial Lot: [id number] Expiration: 05/2025 NDC: 9811-9147-82  Bacteriostatic 0.9% Sodium Chloride- 4 mL  Lot: [id number] Expiration: 03/08/2024 NDC: [address]-62  Dx: X52.841 S/P  Witnessed by S. [facility] CMA

## 2023-04-21 NOTE — Progress Notes (Signed)
Consent Form Botulism Toxin Injection For Chronic Migraine  04-21-2023: stable, we place 10units in each masseter and do not put any in the cervical paraspinals. She now has So improved on botox now she has 4 migraines and < 10 total headache days a month. She feels migraines and headaches are 85-90% improved by botox in freq and severity . Baseline: more than 25 headache days a month with greater than 10 being moderate to severe migraines. Episodic migraines. She now only has 4 migraine days a month and <10 total headache days a month on botox and emgality. Failed sumatriptan, rizatriptan. Prescrive nurtec.  11/04/2022: stable, we place 10units in each masseter and do not put any in the cervical paraspinals. She now has So improved on botox now she has 4 migraines and < 10 total headache days a month. She feels migraines and headaches are 85-90% improved by botox in freq and severity . Baseline: more than 25 headache days a month with greater than 10 being moderate to severe migraines. Episodic migraines. She now only has 4 migraine days a month and <10 total headache days a month on botox and emgality. Failed sumatriptan, rizatriptan. Prescrive nurtec. 08/04/2022: stable  04/18/2022; last seen by Dr Lucia Gaskins in 10/2021 and started on Botox therapy. She had second procedure 01/14/2022. She reports significant improvement in migraines. She feels migraines are 85-90% improved. Baseline: more than 25 headache days a month with greater than 10 being moderate to severe migraines. SHE LOST 170 POUNDS AFTER GASTRIC BYPASS 7 YEARS AGO. She works at [employer] with Dr. Dalbert Garnet.   She now only has 4 migraine days a month and <10 total headache days a month. Failed sumatriptan, rizatriptan. Prescrive nurtec.  Meds ordered this encounter  Medications   botulinum toxin Type A (BOTOX) injection 155 Units    Botox- 200 units x 1 vial Lot: [id number] Expiration: 05/2025 NDC: 4782-9562-13   Bacteriostatic 0.9% Sodium Chloride- 4  mL  Lot: [id number] Expiration: 03/08/2024 NDC: [address]-41   Dx: G43.709 S/P   ondansetron (ZOFRAN-ODT) 4 MG disintegrating tablet    Sig: Dissolve 1-2 tablets (4-8 mg total) by mouth every 8 (eight) hours as needed. May take with Rizatriptan    Dispense:  90 tablet    Refill:  3    30 day supply do not refill early   Rimegepant Sulfate (NURTEC) 75 MG TBDP    Sig: Take 1 tablet (75 mg total) by mouth daily as needed. Please run copay card LKG#:[id number] PCN#: CN GRP#: [id number] ID#: [id number]    Dispense:  16 tablet    Refill:  11    Episodic migraines. She now only has 4 migraine days a month and <10 total headache days a month on botox and emgality. Failed sumatriptan, rizatriptan. Prescrive nurtec. Please run copay card IEP#:[id number] PCN#: CN GRP#: [id number] ID#: [id number]   rizatriptan (MAXALT-MLT) 10 MG disintegrating tablet    Sig: Dissolve 1 tablet (10 mg total) by mouth as needed at onset of migraine. May take another tablet 2 hours later if needed, max 2 tablets in 24 hours.    Dispense:  27 tablet    Refill:  3    This is a 3 month supply do not fill early.    Reviewed orally with patient, additionally signature is on file:  Botulism toxin has been approved by the Federal drug administration for treatment of chronic migraine. Botulism toxin does not cure chronic migraine and it may not be effective in some patients.  The administration  of botulism toxin is accomplished by injecting a small amount of toxin into the muscles of the neck and head. Dosage must be titrated for each individual. Any benefits resulting from botulism toxin tend to wear off after 3 months with a repeat injection required if benefit is to be maintained. Injections are usually done every 3-4 months with maximum effect peak achieved by about 2 or 3 weeks. Botulism toxin is expensive and you should be sure of what costs you will incur resulting from the injection.  The side effects of botulism toxin use for  chronic migraine may include:   -Transient, and usually mild, facial weakness with facial injections  -Transient, and usually mild, head or neck weakness with head/neck injections  -Reduction or loss of forehead facial animation due to forehead muscle weakness  -Eyelid drooping  -Dry eye  -Pain at the site of injection or bruising at the site of injection  -Double vision  -Potential unknown long term risks  Contraindications: You should not have Botox if you are pregnant, nursing, allergic to albumin, have an infection, skin condition, or muscle weakness at the site of the injection, or have myasthenia gravis, Lambert-Eaton syndrome, or ALS.  It is also possible that as with any injection, there may be an allergic reaction or no effect from the medication. Reduced effectiveness after repeated injections is sometimes seen and rarely infection at the injection site may occur. All care will be taken to prevent these side effects. If therapy is given over a long time, atrophy and wasting in the muscle injected may occur. Occasionally the patient's become refractory to treatment because they develop antibodies to the toxin. In this event, therapy needs to be modified.  I have read the above information and consent to the administration of botulism toxin.    BOTOX PROCEDURE NOTE FOR MIGRAINE HEADACHE    Contraindications and precautions discussed with patient(above). Aseptic procedure was observed and patient tolerated procedure. Procedure performed by Dr. Artemio Aly  The condition has existed for more than 6 months, and pt does not have a diagnosis of ALS, Myasthenia Gravis or Lambert-Eaton Syndrome.  Risks and benefits of injections discussed and pt agrees to proceed with the procedure.  Written consent obtained  These injections are medically necessary. Pt  receives good benefits from these injections. These injections do not cause sedations or hallucinations which the oral therapies may  cause.  Description of procedure:  The patient was placed in a sitting position. The standard protocol was used for Botox as follows, with 5 units of Botox injected at each site:   -Procerus muscle, midline injection  -Corrugator muscle, bilateral injection  -Frontalis muscle, bilateral injection, with 2 sites each side, medial injection was performed in the upper one third of the frontalis muscle, in the region vertical from the medial inferior edge of the superior orbital rim. The lateral injection was again in the upper one third of the forehead vertically above the lateral limbus of the cornea, 1.5 cm lateral to the medial injection site.  -Temporalis muscle injection, 4 sites, bilaterally. The first injection was 3 cm above the tragus of the ear, second injection site was 1.5 cm to 3 cm up from the first injection site in line with the tragus of the ear. The third injection site was 1.5-3 cm forward between the first 2 injection sites. The fourth injection site was 1.5 cm posterior to the second injection site.   -Occipitalis muscle injection, 3 sites, bilaterally. The first injection was  done one half way between the occipital protuberance and the tip of the mastoid process behind the ear. The second injection site was done lateral and superior to the first, 1 fingerbreadth from the first injection. The third injection site was 1 fingerbreadth superiorly and medially from the first injection site.  -Trapezius muscle injection was performed at 3 sites, bilaterally. The first injection site was in the upper trapezius muscle halfway between the inflection point of the neck, and the acromion. The second injection site was one half way between the acromion and the first injection site. The third injection was done between the first injection site and the inflection point of the neck.   Will return for repeat injection in 3 months.   200 units of Botox was used, 45 U Botox not injected was  wasted. The patient tolerated the procedure well, there were no complications of the above procedure.

## 2023-04-22 ENCOUNTER — Telehealth: Payer: Self-pay | Admitting: Pharmacy Technician

## 2023-04-22 NOTE — Telephone Encounter (Signed)
Patient Advocate Encounter   Received notification that prior authorization for Nurtec 75MG  dispersible tablets is required.   PA submitted on 04/22/2023 [facility] Insurance [employer] ePA Form 2017 NCPDP Status is pending

## 2023-04-22 NOTE — Telephone Encounter (Signed)
Patient Advocate Encounter  Prior Authorization for Nurtec 75MG  dispersible tablets has been approved.    PA#  [id number] Insurance [employer] ePA Form Effective dates: 04/22/2023 through 04/21/2024

## 2023-04-24 ENCOUNTER — Ambulatory Visit (INDEPENDENT_AMBULATORY_CARE_PROVIDER_SITE_OTHER): Payer: 59 | Admitting: Adult Health

## 2023-04-24 ENCOUNTER — Other Ambulatory Visit (HOSPITAL_COMMUNITY): Payer: Self-pay

## 2023-04-24 ENCOUNTER — Encounter: Payer: Self-pay | Admitting: Adult Health

## 2023-04-24 VITALS — BP 110/80 | HR 75 | Temp 97.5°F | Ht 66.0 in | Wt 178.0 lb

## 2023-04-24 DIAGNOSIS — R000 Tachycardia, unspecified: Secondary | ICD-10-CM

## 2023-04-24 DIAGNOSIS — Z23 Encounter for immunization: Secondary | ICD-10-CM | POA: Diagnosis not present

## 2023-04-24 DIAGNOSIS — K219 Gastro-esophageal reflux disease without esophagitis: Secondary | ICD-10-CM

## 2023-04-24 DIAGNOSIS — Z9884 Bariatric surgery status: Secondary | ICD-10-CM

## 2023-04-24 DIAGNOSIS — A6004 Herpesviral vulvovaginitis: Secondary | ICD-10-CM | POA: Diagnosis not present

## 2023-04-24 DIAGNOSIS — Z0000 Encounter for general adult medical examination without abnormal findings: Secondary | ICD-10-CM

## 2023-04-24 DIAGNOSIS — F5101 Primary insomnia: Secondary | ICD-10-CM | POA: Diagnosis not present

## 2023-04-24 DIAGNOSIS — R300 Dysuria: Secondary | ICD-10-CM

## 2023-04-24 DIAGNOSIS — Z0001 Encounter for general adult medical examination with abnormal findings: Secondary | ICD-10-CM

## 2023-04-24 DIAGNOSIS — G43919 Migraine, unspecified, intractable, without status migrainosus: Secondary | ICD-10-CM

## 2023-04-24 LAB — FOLATE: Folate: >23.9 ng/mL (ref >5.9)

## 2023-04-24 LAB — URINALYSIS
Bilirubin Urine: NEGATIVE
Hgb urine dipstick: NEGATIVE
Leukocytes,Ua: NEGATIVE
Nitrite: NEGATIVE
Specific Gravity, Urine: 1.025 (ref 1.000–1.030)
Total Protein, Urine: NEGATIVE
Urine Glucose: NEGATIVE
Urobilinogen, UA: 0.2 (ref 0.0–1.0)
pH: 5.5 (ref 5.0–8.0)

## 2023-04-24 LAB — LIPID PANEL
Cholesterol: 166 mg/dL (ref 0–200)
HDL: 79.6 mg/dL (ref >39.00)
LDL Cholesterol: 77 mg/dL (ref 0–99)
NonHDL: 85.93
Total CHOL/HDL Ratio: 2
Triglycerides: 45 mg/dL (ref 0.0–149.0)
VLDL: 9 mg/dL (ref 0.0–40.0)

## 2023-04-24 LAB — CBC WITH DIFFERENTIAL/PLATELET
Basophils Absolute: 0 10*3/uL (ref 0.0–0.1)
Basophils Relative: 0.6 % (ref 0.0–3.0)
Eosinophils Absolute: 0 10*3/uL (ref 0.0–0.7)
Eosinophils Relative: 0.4 % (ref 0.0–5.0)
HCT: 39.3 % (ref 36.0–46.0)
Hemoglobin: 13.1 g/dL (ref 12.0–15.0)
Lymphocytes Relative: 42.2 % (ref 12.0–46.0)
Lymphs Abs: 1.6 10*3/uL (ref 0.7–4.0)
MCHC: 33.3 g/dL (ref 30.0–36.0)
MCV: 92.3 fl (ref 78.0–100.0)
Monocytes Absolute: 0.5 10*3/uL (ref 0.1–1.0)
Monocytes Relative: 12.2 % — ABNORMAL HIGH (ref 3.0–12.0)
Neutro Abs: 1.7 10*3/uL (ref 1.4–7.7)
Neutrophils Relative %: 44.6 % (ref 43.0–77.0)
Platelets: 203 10*3/uL (ref 150.0–400.0)
RBC: 4.26 Mil/uL (ref 3.87–5.11)
RDW: 14 % (ref 11.5–15.5)
WBC: 3.7 10*3/uL — ABNORMAL LOW (ref 4.0–10.5)

## 2023-04-24 LAB — COMPREHENSIVE METABOLIC PANEL
ALT: 29 U/L (ref 0–35)
AST: 29 U/L (ref 0–37)
Albumin: 3.9 g/dL (ref 3.5–5.2)
Alkaline Phosphatase: 46 U/L (ref 39–117)
BUN: 13 mg/dL (ref 6–23)
CO2: 30 mEq/L (ref 19–32)
Calcium: 8.7 mg/dL (ref 8.4–10.5)
Chloride: 101 mEq/L (ref 96–112)
Creatinine, Ser: 0.78 mg/dL (ref 0.40–1.20)
GFR: 88.67 mL/min (ref >60.00)
Glucose, Bld: 76 mg/dL (ref 70–99)
Potassium: 3.7 mEq/L (ref 3.5–5.1)
Sodium: 137 mEq/L (ref 135–145)
Total Bilirubin: 0.5 mg/dL (ref 0.2–1.2)
Total Protein: 6.9 g/dL (ref 6.0–8.3)

## 2023-04-24 LAB — HEMOGLOBIN A1C: Hgb A1c MFr Bld: 5.4 % (ref 4.6–6.5)

## 2023-04-24 LAB — IBC + FERRITIN
Ferritin: 6.6 ng/mL — ABNORMAL LOW (ref 10.0–291.0)
Iron: 81 ug/dL (ref 42–145)
Saturation Ratios: 19 % — ABNORMAL LOW (ref 20.0–50.0)
TIBC: 427 ug/dL (ref 250.0–450.0)
Transferrin: 305 mg/dL (ref 212.0–360.0)

## 2023-04-24 LAB — TSH: TSH: 2.05 u[IU]/mL (ref 0.35–5.50)

## 2023-04-24 LAB — VITAMIN B12: Vitamin B-12: 208 pg/mL — ABNORMAL LOW (ref 211–911)

## 2023-04-24 LAB — VITAMIN D 25 HYDROXY (VIT D DEFICIENCY, FRACTURES): VITD: 34.16 ng/mL (ref 30.00–100.00)

## 2023-04-24 NOTE — Progress Notes (Addendum)
Subjective:    Patient ID: Donna Park, female    DOB: 13-Jan-1971, 52 y.o.   MRN: [id number]  HPI Patient presents for yearly preventative medicine examination. She is a pleasant 52 year old female who  has a past medical history of Allergy, Anxiety, Arthritis, Asthma, Chicken pox, Complication of anesthesia, Depression, DJD (degenerative joint disease), GERD (gastroesophageal reflux disease), H/O gastric bypass (2016), History of iron deficiency anemia, HSV infection, Migraines, Obese, Pneumonia, PONV (postoperative nausea and vomiting), Status post dilation of esophageal narrowing, and Varicose vein of leg.  GERD-uses Protonix 40 mg daily and feels well controlled on this medication.  HSV-takes Valtrex 500 mg twice daily. Has had recent outbreaks due to stress in her life.   Migraine headaches-  Takes Elavil PRN  and Maxalt PRN and gets botox injections periodically. Also taking Nurec every other day and Emgality monthly.  Managed by Neurology   Insomnia -currently managed with Valium 10 mg nightly as needed.  She does have a longstanding history of insomnia.  Has tried multiple medications in the past including being on melatonin.  Asthma-currently managed by allergy and asthma.  Prescribed albuterol inhaler, Advair inhaler, Trelegy inhaler ( she switched back and forth between Trelegy and Advair) and Trespire injection.  She feels as though her symptoms are well controlled  History of Gastric Bypass -had Roux-en-Y surgery in 2016.  She denies nausea or vomiting.  Does have alternating bowel habits with mostly constipation since her area check surgery.  She did recently have a colonoscopy and had a few polyps removed.  She has tried MiraLAX and fiber without relief.  She has seen by GI for this issue in which time they did a colonoscopy.  No follow-up at this time.  She would like her labs checked  UTI - multiple UTI in the past. She has not heard from UROGYN yet. Her last UTI was a  month ago. She believes that she may have another UTI. Symptoms include that of frequency and dysuria.   Tachycardia - she reports that for the last year, every time she eats something her heart will race for about 15-20 minutes. Sweets make the heart rate faster but this can happen after any food. Denies other symptoms.. This triggers her apple watch and she has had readings upwards to 165 days.   All immunizations and health maintenance protocols were reviewed with the patient and needed orders were placed.  Appropriate screening laboratory values were ordered for the patient including screening of hyperlipidemia, renal function and hepatic function.   Medication reconciliation,  past medical history, social history, problem list and allergies were reviewed in detail with the patient  Goals were established with regard to weight loss, exercise, and  diet in compliance with medications  Review of Systems  Constitutional: Negative.   HENT: Negative.    Eyes: Negative.   Respiratory: Negative.    Cardiovascular:  Positive for palpitations.  Gastrointestinal: Negative.   Endocrine: Negative.   Genitourinary: Negative.   Musculoskeletal: Negative.   Skin: Negative.   Allergic/Immunologic: Negative.   Neurological:  Positive for headaches.  Hematological: Negative.   Psychiatric/Behavioral: Negative.     Past Medical History:  Diagnosis Date   Allergy    Anxiety    Arthritis    Asthma    Chicken pox    Complication of anesthesia    slow to wake up from anesthesia   Depression    DJD (degenerative joint disease)    GERD (gastroesophageal reflux  disease)    resolved after gastric surgery   H/O gastric bypass 2016   History of iron deficiency anemia    HSV infection    Migraines    Obese    Pneumonia    Childhood history   PONV (postoperative nausea and vomiting)    Status post dilation of esophageal narrowing    Varicose vein of leg     Social History   Socioeconomic  History   Marital status: Married    Spouse name: Not on file   Number of children: 4   Years of education: Not on file   Highest education level: Associate degree: academic program  Occupational History   Not on file  Tobacco Use   Smoking status: Never   Smokeless tobacco: Never  Vaping Use   Vaping Use: Never used  Substance and Sexual Activity   Alcohol use: Yes    Comment: Social/Rare   Drug use: Never   Sexual activity: Yes    Birth control/protection: Surgical  Other Topics Concern   Not on file  Social History Narrative   Not on file   Social Determinants of Health   Financial Resource Strain: Medium Risk (01/28/2022)   Overall Financial Resource Strain (CARDIA)    Difficulty of Paying Living Expenses: Somewhat hard  Food Insecurity: No Food Insecurity (01/28/2022)   Hunger Vital Sign    Worried About Running Out of Food in the Last Year: Never true    Ran Out of Food in the Last Year: Never true  Transportation Needs: No Transportation Needs (01/28/2022)   PRAPARE - Administrator, Civil Service (Medical): No    Lack of Transportation (Non-Medical): No  Physical Activity: Sufficiently Active (01/28/2022)   Exercise Vital Sign    Days of Exercise per Week: 5 days    Minutes of Exercise per Session: 150+ min  Stress: Stress Concern Present (01/28/2022)   [employer] of Occupational Health - Occupational Stress Questionnaire    Feeling of Stress : Very much  Social Connections: Moderately Integrated (01/28/2022)   Social Connection and Isolation Panel [NHANES]    Frequency of Communication with Friends and Family: More than three times a week    Frequency of Social Gatherings with Friends and Family: Never    Attends Religious Services: More than 4 times per year    Active Member of Clubs or Organizations: Yes    Attends Engineer, structural: More than 4 times per year    Marital Status: Separated  Intimate Partner Violence: Not on file     Past Surgical History:  Procedure Laterality Date   ANKLE FRACTURE SURGERY Left 03/08/2009   BREAST SURGERY Bilateral    Cyst Removal   ESOPHAGEAL MANOMETRY N/A 09/24/2022   Procedure: ESOPHAGEAL MANOMETRY (EM);  Surgeon: Shellia Cleverly, DO;  Location: [facility];  Service: Gastroenterology;  Laterality: N/A;   GASTRIC BYPASS  06/18/2015   LAPAROSCOPIC VAGINAL HYSTERECTOMY WITH SALPINGECTOMY Bilateral 05/15/2020   Procedure: LAPAROSCOPIC ASSISTED VAGINAL HYSTERECTOMY WITH SALPINGECTOMY;  Surgeon: Candice Camp, MD;  Location: [facility];  Service: Gynecology;  Laterality: Bilateral;  need bed   LASER ABLATION     Varicose veins   TONSILLECTOMY AND ADENOIDECTOMY  2001   TUBAL LIGATION  12/19/1999   UPPER GASTROINTESTINAL ENDOSCOPY     UPPER GI ENDOSCOPY     Uterine ablation  12/2018    Family History  Problem Relation Age of Onset   COPD Mother  Heart disease Mother    Alcohol abuse Mother    Depression Mother    Drug abuse Mother    High Cholesterol Mother    High blood pressure Mother    COPD Father    Aneurysm Father    Heart disease Father    Early death Father    Diabetes Father    Drug abuse Father    AAA (abdominal aortic aneurysm) Father        cause of death at 58   Arthritis Sister    Kidney disease Sister    Crohn's disease Sister    Ulcerative colitis Sister    Thyroid disease Sister    Kidney disease Brother    Kidney disease Maternal Aunt    Diabetes Paternal Aunt    Asthma Neg Hx    Allergic rhinitis Neg Hx    Colon cancer Neg Hx    Rectal cancer Neg Hx    Stomach cancer Neg Hx     Allergies  Allergen Reactions   Cyclobenzaprine Shortness Of Breath and Other (See Comments)    Relaxed tongue    Misc. Sulfonamide Containing Compounds Anaphylaxis   Shrimp Flavor Anaphylaxis   Sulfa Antibiotics Anaphylaxis, Hives and Swelling   Carisoprodol Hives   Cat Hair Extract Itching    Cat; Skin test + 12-13-2010   Cephalexin  Other (See Comments)    Tongue burning   Metronidazole     not allergic; drug doesn't work for pt   Morphine Other (See Comments)    Feels like her head is going to "blow up"     Pollen Extract Itching    rhinitis TREES, grass; skin test + 12-13-10; sIgE + 03-2011   Topiramate     Brain fog   Adhesive [Tape] Rash    Other reaction(s): SKIN - rash (skin burns) PAPER TAPE IS OK    Tramadol Itching, Nausea And Vomiting and Rash    Current Outpatient Medications on File Prior to Visit  Medication Sig Dispense Refill   acetaminophen (TYLENOL) 500 MG tablet Take by mouth.     acyclovir ointment (ZOVIRAX) 5 % Apply onto the affected area every 3 hours. 15 g 1   albuterol (VENTOLIN HFA) 108 (90 Base) MCG/ACT inhaler Inhale 1 - 2 puffs into the lungs every 6 hours as needed for wheezing or shortness of breath. 6.7 g 1   alendronate (FOSAMAX) 70 MG tablet Take 1 tablet (70 mg total) by mouth every 7 (seven) days. 13 tablet 0   amitriptyline (ELAVIL) 10 MG tablet Take 1 tablet (10 mg total) by mouth at bedtime. 90 tablet 1   Azelastine-Fluticasone (DYMISTA) 137-50 MCG/ACT SUSP Place 2 sprays into both nostrils 2 (two) times daily as needed. 23 g 5   botulinum toxin Type A (BOTOX) 200 units injection Provider to inject 155 units into the muscles of the head and neck every 12 weeks. Discard remainder. 1 each 3   botulinum toxin Type A (BOTOX) 200 units injection Inject 155 units in neck and head muscles every 12 weeks Administrated by provider . Discard remaninig 1 each 3   CALCIUM PO Take 1 tablet by mouth 4 (four) times a week.     cetirizine (ZYRTEC) 10 MG tablet Take 2 tablets (20 mg total) by mouth 2 (two) times daily. 360 tablet 1   Cyanocobalamin (NASCOBAL NA) Place 1 spray into the nose every Sunday.     diazepam (VALIUM) 10 MG tablet Take 1 tablet (10 mg total)  by mouth at bedtime as needed for sleep. 30 tablet 2   EPINEPHrine 0.3 mg/0.3 mL IJ SOAJ injection Inject 0.3 mg into the muscle as  needed for anaphylaxis. 2 each 1   estradiol (ESTRACE) 2 MG tablet Take 1 tablet (2 mg total) by mouth daily. 90 tablet 12   estradiol (ESTRACE) 2 MG tablet Take 1 tablet (2 mg total) by mouth daily. 30 tablet 12   Estradiol Starter Pack (IMVEXXY STARTER PACK) 10 MCG INST Insert 1 soft gel daily for 2 weeks. After this, insert 1 soft gel twice weekly 18 each 1   fluconazole (DIFLUCAN) 150 MG tablet Take 1 tablet (150 mg total) by mouth daily. 14 tablet 5   Fluticasone-Umeclidin-Vilant (TRELEGY ELLIPTA) 200-62.5-25 MCG/ACT AEPB Inhale 1 puff into the lungs daily. 60 each 5   Galcanezumab-gnlm (EMGALITY) 120 MG/ML SOAJ Inject 120 mg into the skin every 30 (thirty) days. 3 mL 0   Hypertonic Nasal Wash (SINUS RINSE REFILL) PACK Use one packet dissolved in water as needed. (Patient taking differently: Place 1 each into the nose in the morning and at bedtime.) 100 each 5   ibuprofen (ADVIL) 800 MG tablet Take 1 tablet (800 mg total) by mouth 3 (three) times daily as needed (pain.). 270 tablet 0   ipratropium (ATROVENT) 0.06 % nasal spray Place 2 sprays into both nostrils 4 (four) times daily. 45 mL 3   linaclotide (LINZESS) 145 MCG CAPS capsule Take 1 capsule (145 mcg total) by mouth daily before breakfast. 60 capsule 3   linaclotide (LINZESS) 290 MCG CAPS capsule Take 1 capsule (290 mcg total) by mouth daily before breakfast. 90 capsule 1   meclizine (ANTIVERT) 25 MG tablet Take 25 mg by mouth 3 (three) times daily as needed (dizziness/vertigo).      ondansetron (ZOFRAN-ODT) 4 MG disintegrating tablet Dissolve 1-2 tablets (4-8 mg total) by mouth every 8 (eight) hours as needed. May take with Rizatriptan 90 tablet 3   pantoprazole (PROTONIX) 40 MG tablet Take 1 tablet (40 mg total) by mouth at bedtime. 90 tablet 3   Pediatric Multiple Vit-C-FA (MULTIVITAMIN ANIMAL SHAPES, WITH CA/FA,) with C & FA chewable tablet Chew 2 tablets by mouth daily.     Rimegepant Sulfate (NURTEC) 75 MG TBDP Take 1 tablet (75 mg  total) by mouth daily as needed. 16 tablet 11   rizatriptan (MAXALT-MLT) 10 MG disintegrating tablet Dissolve 1 tablet (10 mg total) by mouth as needed at onset of migraine. May take another tablet 2 hours later if needed, max 2 tablets in 24 hours. 27 tablet 3   simethicone (MYLICON) 80 MG chewable tablet Chew 80 mg by mouth every 6 (six) hours as needed for flatulence.     tezepelumab-ekko (TEZSPIRE) 210 MG/1. syringe Inject 1.91 mLs (210 mg total) into the skin every 28 (twenty-eight) days. 1.91 mL 11   valACYclovir (VALTREX) 500 MG tablet Take 500 mg by mouth 2 (two) times daily.     Vibegron (GEMTESA) 75 MG TABS Take 1 tablet by mouth daily. 90 tablet 4   Current Facility-Administered Medications on File Prior to Visit  Medication Dose Route Frequency Provider Last Rate Last Admin   tezepelumab-ekko (TEZSPIRE) 210 MG/1. syringe 210 mg  210 mg Subcutaneous Q28 days Alfonse Spruce, MD   210 mg at 04/10/23 1409    BP 110/80   Pulse 75   Temp (!) 97.5 F (36.4 C) (Oral)   Ht 5\' 6"  (1.676 m)   Wt 178 lb (80.7 kg)  SpO2 98%   BMI 28.73 kg/m       Objective:   Physical Exam Vitals and nursing note reviewed.  Constitutional:      General: She is not in acute distress.    Appearance: Normal appearance. She is not ill-appearing.  HENT:     Head: Normocephalic and atraumatic.     Right Ear: Tympanic membrane, ear canal and external ear normal. There is no impacted cerumen.     Left Ear: Tympanic membrane, ear canal and external ear normal. There is no impacted cerumen.     Nose: Nose normal. No congestion or rhinorrhea.     Mouth/Throat:     Mouth: Mucous membranes are moist.     Pharynx: Oropharynx is clear.  Eyes:     Extraocular Movements: Extraocular movements intact.     Conjunctiva/sclera: Conjunctivae normal.     Pupils: Pupils are equal, round, and reactive to light.  Neck:     Vascular: No carotid bruit.  Cardiovascular:     Rate and Rhythm: Normal rate  and regular rhythm.     Pulses: Normal pulses.     Heart sounds: No murmur heard.    No friction rub. No gallop.  Pulmonary:     Effort: Pulmonary effort is normal.     Breath sounds: Normal breath sounds.  Abdominal:     General: Abdomen is flat. Bowel sounds are normal. There is no distension.     Palpations: Abdomen is soft. There is no mass.     Tenderness: There is no abdominal tenderness. There is no guarding or rebound.     Hernia: No hernia is present.  Musculoskeletal:        General: Normal range of motion.     Cervical back: Normal range of motion and neck supple.  Lymphadenopathy:     Cervical: No cervical adenopathy.  Skin:    General: Skin is warm and dry.     Capillary Refill: Capillary refill takes less than 2 seconds.  Neurological:     General: No focal deficit present.     Mental Status: She is alert and oriented to person, place, and time.  Psychiatric:        Mood and Affect: Mood normal.        Behavior: Behavior normal.        Thought Content: Thought content normal.        Judgment: Judgment normal.       Assessment & Plan:  1. Routine general medical examination at a health care facility Today patient counseled on age appropriate routine health concerns for screening and prevention, each reviewed and up to date or declined. Immunizations reviewed and up to date or declined. Labs ordered and reviewed. Risk factors for depression reviewed and negative. Hearing function and visual acuity are intact. ADLs screened and addressed as needed. Functional ability and level of safety reviewed and appropriate. Education, counseling and referrals performed based on assessed risks today. Patient provided with a copy of personalized plan for preventive services. - Shingles vaccination given today, follow up in 2-6 months for second shot  - Continue to exercise and eat healthy   2. Gastroesophageal reflux disease without esophagitis - Continue PPI  - CBC with  Differential/Platelet; Future - Comprehensive metabolic panel; Future - Lipid panel; Future - TSH; Future  3. Type 2 HSV infection of vulvovaginal region - Continue with Valtrex  4. Intractable migraine without status migrainosus, unspecified migraine type - Per neurology   5.  Primary insomnia - Continue valium as needed  6. History of Roux-en-Y gastric bypass  - CBC with Differential/Platelet; Future - Comprehensive metabolic panel; Future - Lipid panel; Future - TSH; Future - VITAMIN D 25 Hydroxy (Vit-D Deficiency, Fractures); Future - Folate; Future - Vitamin B12; Future - PTH, Intact and Calcium; Future - Vitamin A; Future - IBC + Ferritin; Future  7. Dysuria  - Urinalysis; Future - Urine Culture; Future  8. Tachycardia  - Ambulatory referral to Cardiology  Shirline Frees, NP

## 2023-04-24 NOTE — Addendum Note (Signed)
Addended by: Nancy Fetter on: 04/24/2023 10:51 AM   Modules accepted: Orders

## 2023-04-24 NOTE — Patient Instructions (Signed)
It was great seeing you today   We will follow up with you regarding your lab work   Please let me know if you need anything   Someone from Cardiology will call you to schedule

## 2023-04-25 LAB — URINE CULTURE
Result:: NO GROWTH
SPECIMEN QUALITY:: ADEQUATE

## 2023-04-26 ENCOUNTER — Encounter: Payer: Self-pay | Admitting: Adult Health

## 2023-04-27 LAB — PTH, INTACT AND CALCIUM
Calcium: 8.4 mg/dL — ABNORMAL LOW (ref 8.6–10.4)
PTH: 40 pg/mL (ref 16–77)

## 2023-04-27 LAB — VITAMIN A: Vitamin A (Retinoic Acid): 45 ug/dL (ref 38–98)

## 2023-04-27 LAB — URINE CULTURE: MICRO NUMBER:: 14971743

## 2023-04-28 ENCOUNTER — Other Ambulatory Visit: Payer: Self-pay | Admitting: *Deleted

## 2023-04-28 ENCOUNTER — Other Ambulatory Visit: Payer: Self-pay

## 2023-04-28 ENCOUNTER — Other Ambulatory Visit (HOSPITAL_COMMUNITY): Payer: Self-pay

## 2023-04-28 DIAGNOSIS — Z114 Encounter for screening for human immunodeficiency virus [HIV]: Secondary | ICD-10-CM

## 2023-04-28 MED ORDER — TEZSPIRE 210 MG/1.91ML ~~LOC~~ SOSY
210.0000 mg | PREFILLED_SYRINGE | SUBCUTANEOUS | 11 refills | Status: DC
  Filled 2023-04-28: qty 1.91, 28d supply, fill #0

## 2023-04-29 ENCOUNTER — Other Ambulatory Visit (HOSPITAL_COMMUNITY): Payer: Self-pay

## 2023-04-29 NOTE — Telephone Encounter (Signed)
Pt did not read labs or Mychart message. Called pt to go over lab results that was posted via Mychart. Also, gave pt update of the questions and concerns that she had. Pt verbalized understanding.

## 2023-04-30 ENCOUNTER — Other Ambulatory Visit: Payer: Self-pay

## 2023-04-30 NOTE — Progress Notes (Signed)
Donna Park [name] [address] [place] [address] Phone: [phone]   Assessment and Plan:     1. Neck pain 2. Chronic bilateral low back pain without sciatica 3. Somatic dysfunction of cervical region 4. Somatic dysfunction of thoracic region 5. Somatic dysfunction of lumbar region 6. Somatic dysfunction of pelvic region 7. Somatic dysfunction of rib region 8. Rib pain on left side  -Chronic with exacerbation, subsequent sports medicine visit - Recurrence of multiple musculoskeletal complaints with most prominent being left-sided rib pain - Recommend using Tylenol/ibuprofen as needed for pain relief - Patient has received significant relief with OMT in the past.  Elects for repeat OMT today.  Tolerated well per note below. - Decision today to treat with OMT was based on Physical Exam   After verbal consent patient was treated with HVLA (high velocity low amplitude), ME (muscle energy), FPR (flex positional release), ST (soft tissue), PC/PD (Pelvic Compression/ Pelvic Decompression) techniques in cervical, rib, thoracic, lumbar, and pelvic areas. Patient tolerated the procedure well with improvement in symptoms.  Patient educated on potential side effects of soreness and recommended to rest, hydrate, and use Tylenol as needed for pain control.   Pertinent previous records reviewed include recent lab work including CBC, CMP, vitamin B12, ferritin from lab work on 04/24/2023   Follow Up: 4 weeks for reevaluation.  Could repeat calcium and phosphate levels at that time to see if they are within normal limits to start Fosamax treatment for osteoporosis.  Could also repeat OMT   Subjective:   I, Donna Park, am serving as a Neurosurgeon for Doctor Richardean Sale   Chief Complaint: left shoulder pain and tightness   HPI:  01/31/2022 Patient is a 52 year old female complaining of neck and shoulder pain. Patient states been going on for about 2 weeks  has knots in her shoulders both neck and shoulders have been stiff , interested in OMT , knots in her upper trap , low back and her sciatic nerve on her right side flares up when she's having intercourse goes down her leg and upper her back    02/21/2022 Patient states that her neck and her shoulders are still bothering her after her adjustment she was very tight is still tight in the upper back , still wants the adjustment will get a massage later tonight as well  , hip still hurts , her tailbone she woke up one morning with pain     03/06/2022 Patient states still has upper trap tightness, finder her self leaning on that left side    03/28/2022 Patient states that she is still tight time for an adjustment would like to discuss a lower spot for CSI    04/25/2022 Patient states that her neck is heavy, would like a referral for MRI, still neck tightness and pain,   05/23/2022 Patient states she is ready for the OMT but the left shoulder clavicle have been sore, neck and shoulder still hurts. Patient has had the MRI of the ankle this morning. Patient wanting to know if she can get another shoulder injection today    06/20/2022 Patient states that she is a little better decreased ROM but is going to PT , ankle feels better bought different shoes    08/08/2022  Patient states shoulder still hurts has been dry needling and want to talk talk about lidocaine again and then low back pain want referral for PT and sciatic pain    08/29/2022  Patient states wants OMT, knot still hurts    09/26/2022 Patient states that her shoulder still hurts , last week her bones were hurting , body is achy and painful    10/24/2022 Patient states that the bones and the back of her neck are getting tight and painful same spot as when she came in the first time, also the upper trap knot coming back    11/21/2022 Patient states neck and,  shoulder are tight , stopped PT , needs  tune up    12/19/2022 Patient states she  is hurting everywhere today , neck and shoulder are stiff, needs a good stretch today    01/16/2023 Patient states neck , low back and hips , has been able to work out, neck knots have gotten better but still tight    02/13/2023 Patient states neck is still in pain right shoulder is tight needs an adjustment    03/20/2023 Patient states neck and spine tightness through thoracic , neck and shoulder decreased ROM,if possible injection or trigger points    04/09/2023 Patient states she has a migraine, back feels better from trigger points , thoracic and low back adjustment    05/01/2023 Patient states that she is having pain in L side over ribs. Felt a pop after boyfriend hugged her.    Relevant Historical Information: History of migraines being treated with Botox injections  Additional pertinent review of systems negative.  Current Outpatient Medications  Medication Sig Dispense Refill   acetaminophen (TYLENOL) 500 MG tablet Take by mouth.     acyclovir ointment (ZOVIRAX) 5 % Apply onto the affected area every 3 hours. 15 g 1   albuterol (VENTOLIN HFA) 108 (90 Base) MCG/ACT inhaler Inhale 1 - 2 puffs into the lungs every 6 hours as needed for wheezing or shortness of breath. 6.7 g 1   alendronate (FOSAMAX) 70 MG tablet Take 1 tablet (70 mg total) by mouth every 7 (seven) days. 13 tablet 0   amitriptyline (ELAVIL) 10 MG tablet Take 1 tablet (10 mg total) by mouth at bedtime. 90 tablet 1   Azelastine-Fluticasone (DYMISTA) 137-50 MCG/ACT SUSP Place 2 sprays into both nostrils 2 (two) times daily as needed. 23 g 5   botulinum toxin Type A (BOTOX) 200 units injection Provider to inject 155 units into the muscles of the head and neck every 12 weeks. Discard remainder. 1 each 3   botulinum toxin Type A (BOTOX) 200 units injection Inject 155 units in neck and head muscles every 12 weeks Administrated by provider . Discard remaninig 1 each 3   CALCIUM PO Take 1 tablet by mouth 4 (four) times a week.      cetirizine (ZYRTEC) 10 MG tablet Take 2 tablets (20 mg total) by mouth 2 (two) times daily. 360 tablet 1   Cyanocobalamin (NASCOBAL NA) Place 1 spray into the nose every Sunday.     diazepam (VALIUM) 10 MG tablet Take 1 tablet (10 mg total) by mouth at bedtime as needed for sleep. 30 tablet 2   EPINEPHrine 0.3 mg/0.3 mL IJ SOAJ injection Inject 0.3 mg into the muscle as needed for anaphylaxis. 2 each 1   estradiol (ESTRACE) 2 MG tablet Take 1 tablet (2 mg total) by mouth daily. 90 tablet 12   estradiol (ESTRACE) 2 MG tablet Take 1 tablet (2 mg total) by mouth daily. 30 tablet 12   Estradiol Starter Pack (IMVEXXY STARTER PACK) 10 MCG INST Insert 1 soft gel daily for 2 weeks. After this,  insert 1 soft gel twice weekly 18 each 1   fluconazole (DIFLUCAN) 150 MG tablet Take 1 tablet (150 mg total) by mouth daily. 14 tablet 5   Fluticasone-Umeclidin-Vilant (TRELEGY ELLIPTA) 200-62.5-25 MCG/ACT AEPB Inhale 1 puff into the lungs daily. 60 each 5   Galcanezumab-gnlm (EMGALITY) 120 MG/ML SOAJ Inject 120 mg into the skin every 30 (thirty) days. 3 mL 0   Hypertonic Nasal Wash (SINUS RINSE REFILL) PACK Use one packet dissolved in water as needed. (Patient taking differently: Place 1 each into the nose in the morning and at bedtime.) 100 each 5   ibuprofen (ADVIL) 800 MG tablet Take 1 tablet (800 mg total) by mouth 3 (three) times daily as needed (pain.). 270 tablet 0   ipratropium (ATROVENT) 0.06 % nasal spray Place 2 sprays into both nostrils 4 (four) times daily. 45 mL 3   linaclotide (LINZESS) 145 MCG CAPS capsule Take 1 capsule (145 mcg total) by mouth daily before breakfast. 60 capsule 3   linaclotide (LINZESS) 290 MCG CAPS capsule Take 1 capsule (290 mcg total) by mouth daily before breakfast. 90 capsule 1   meclizine (ANTIVERT) 25 MG tablet Take 25 mg by mouth 3 (three) times daily as needed (dizziness/vertigo).      ondansetron (ZOFRAN-ODT) 4 MG disintegrating tablet Dissolve 1-2 tablets (4-8 mg total) by  mouth every 8 (eight) hours as needed. May take with Rizatriptan 90 tablet 3   pantoprazole (PROTONIX) 40 MG tablet Take 1 tablet (40 mg total) by mouth at bedtime. 90 tablet 3   Pediatric Multiple Vit-C-FA (MULTIVITAMIN ANIMAL SHAPES, WITH CA/FA,) with C & FA chewable tablet Chew 2 tablets by mouth daily.     Rimegepant Sulfate (NURTEC) 75 MG TBDP Take 1 tablet (75 mg total) by mouth daily as needed. 16 tablet 11   rizatriptan (MAXALT-MLT) 10 MG disintegrating tablet Dissolve 1 tablet (10 mg total) by mouth as needed at onset of migraine. May take another tablet 2 hours later if needed, max 2 tablets in 24 hours. 27 tablet 3   simethicone (MYLICON) 80 MG chewable tablet Chew 80 mg by mouth every 6 (six) hours as needed for flatulence.     tezepelumab-ekko (TEZSPIRE) 210 MG/1. syringe Inject 1.91 mLs (210 mg total) into the skin every 28 (twenty-eight) days. 1.91 mL 11   valACYclovir (VALTREX) 500 MG tablet Take 500 mg by mouth 2 (two) times daily.     Vibegron (GEMTESA) 75 MG TABS Take 1 tablet by mouth daily. 90 tablet 4   Current Facility-Administered Medications  Medication Dose Route Frequency Provider Last Rate Last Admin   tezepelumab-ekko (TEZSPIRE) 210 MG/1. syringe 210 mg  210 mg Subcutaneous Q28 days Alfonse Spruce, MD   210 mg at 04/10/23 1409      Objective:     Vitals:   05/01/23 1053  BP: 120/80  Pulse: 73  SpO2: 93%  Weight: 180 lb (81.6 kg)  Height: 5\' 6"  (1.676 m)      Body mass index is 29.05 kg/m.    Physical Exam:     General: Well-appearing, cooperative, sitting comfortably in no acute distress.   OMT Physical Exam:  ASIS Compression Test: Positive Right Cervical: TTP paraspinal, C3-5 RRSR Rib: Bilateral elevated first rib with TTP.  Left sixth rib stuck in inhalation Thoracic: TTP paraspinal, T5-7 RLSR Lumbar: TTP paraspinal, L1-3 RRSL Pelvis: Right anterior innominate  Electronically signed by:  Donna Park [name] 11:16 AM 05/01/23

## 2023-05-01 ENCOUNTER — Ambulatory Visit (INDEPENDENT_AMBULATORY_CARE_PROVIDER_SITE_OTHER): Payer: 59 | Admitting: Sports Medicine

## 2023-05-01 VITALS — BP 120/80 | HR 73 | Ht 66.0 in | Wt 180.0 lb

## 2023-05-01 DIAGNOSIS — M9901 Segmental and somatic dysfunction of cervical region: Secondary | ICD-10-CM | POA: Diagnosis not present

## 2023-05-01 DIAGNOSIS — M545 Low back pain, unspecified: Secondary | ICD-10-CM

## 2023-05-01 DIAGNOSIS — M9908 Segmental and somatic dysfunction of rib cage: Secondary | ICD-10-CM

## 2023-05-01 DIAGNOSIS — G8929 Other chronic pain: Secondary | ICD-10-CM

## 2023-05-01 DIAGNOSIS — M9902 Segmental and somatic dysfunction of thoracic region: Secondary | ICD-10-CM

## 2023-05-01 DIAGNOSIS — R0781 Pleurodynia: Secondary | ICD-10-CM

## 2023-05-01 DIAGNOSIS — M542 Cervicalgia: Secondary | ICD-10-CM

## 2023-05-01 DIAGNOSIS — M9903 Segmental and somatic dysfunction of lumbar region: Secondary | ICD-10-CM

## 2023-05-01 DIAGNOSIS — M9905 Segmental and somatic dysfunction of pelvic region: Secondary | ICD-10-CM | POA: Diagnosis not present

## 2023-05-01 NOTE — Patient Instructions (Signed)
Recommend starting OTC Calcium and B12 supplement Use tylenol and IBU as needed for rib pain See me in 4 weeks

## 2023-05-05 ENCOUNTER — Other Ambulatory Visit (HOSPITAL_COMMUNITY): Payer: Self-pay

## 2023-05-06 ENCOUNTER — Other Ambulatory Visit: Payer: Self-pay

## 2023-05-06 ENCOUNTER — Telehealth: Payer: Self-pay | Admitting: Pharmacist

## 2023-05-06 NOTE — Telephone Encounter (Signed)
Called patient to schedule an appointment for the [facility]. I was unable to reach the patient so I left a HIPAA-compliant message requesting that the patient return my call.   Luke Van Ausdall, PharmD, BCACP, CPP Clinical Pharmacist [facility] [phone]  

## 2023-05-08 ENCOUNTER — Other Ambulatory Visit: Payer: Self-pay | Admitting: Family Medicine

## 2023-05-08 ENCOUNTER — Ambulatory Visit: Payer: 59 | Attending: Adult Health | Admitting: Pharmacist

## 2023-05-08 ENCOUNTER — Ambulatory Visit: Payer: 59

## 2023-05-08 ENCOUNTER — Other Ambulatory Visit: Payer: Self-pay

## 2023-05-08 ENCOUNTER — Other Ambulatory Visit (HOSPITAL_COMMUNITY): Payer: Self-pay

## 2023-05-08 DIAGNOSIS — Z79899 Other long term (current) drug therapy: Secondary | ICD-10-CM

## 2023-05-08 MED ORDER — TEZSPIRE 210 MG/1.91ML ~~LOC~~ SOSY
210.0000 mg | PREFILLED_SYRINGE | SUBCUTANEOUS | 11 refills | Status: DC
  Filled 2023-05-08: qty 1.91, 28d supply, fill #0
  Filled 2023-05-25: qty 1.91, 28d supply, fill #1
  Filled 2023-07-01: qty 1.91, 28d supply, fill #2
  Filled 2023-07-30: qty 1.91, 28d supply, fill #3
  Filled 2023-09-01: qty 1.91, 28d supply, fill #4
  Filled 2023-10-05 (×2): qty 1.91, 28d supply, fill #5
  Filled 2023-10-09: qty 1.91, 28d supply, fill #6
  Filled 2023-11-12 – 2023-11-27 (×2): qty 1.91, 28d supply, fill #7
  Filled 2024-02-19 – 2024-02-23 (×2): qty 1.91, 28d supply, fill #8
  Filled 2024-03-03: qty 1.91, 28d supply, fill #9
  Filled 2024-04-14 (×2): qty 1.91, 28d supply, fill #10

## 2023-05-08 NOTE — Progress Notes (Signed)
   S: Patient presents today for review of their specialty medication.   Patient is currently taking Tezspire for asthma. Patient is managed by Dr. Dellis Anes for this.   Dosing: 210 mg subq q28days  Adherence: confirms.   Efficacy: pt endorses good control.  Monitoring:  S/Sx of hypersensitivity: none. Some itching at the injection site initially but nothing currently. PFTs: monitored by Dr. Zettie Pho office  S/sx of infection: none   Current adverse effects: none reported   O:     Lab Results  Component Value Date   WBC 3.7 (L) 04/24/2023   HGB 13.1 04/24/2023   HCT 39.3 04/24/2023   MCV 92.3 04/24/2023   PLT 203.0 04/24/2023      Chemistry      Component Value Date/Time   NA 137 04/24/2023 1041   NA 140 06/29/2019 1055   K 3.7 04/24/2023 1041   CL 101 04/24/2023 1041   CO2 30 04/24/2023 1041   BUN 13 04/24/2023 1041   BUN 11 06/29/2019 1055   CREATININE 0.78 04/24/2023 1041      Component Value Date/Time   CALCIUM 8.4 (L) 04/24/2023 1041   CALCIUM 8.7 04/24/2023 1041   ALKPHOS 46 04/24/2023 1041   AST 29 04/24/2023 1041   ALT 29 04/24/2023 1041   BILITOT 0.5 04/24/2023 1041   BILITOT 0.5 06/29/2019 1055     A/P: 1. Medication review: patient currently on Tezspire for asthma. She is tolerating this well with reported efficacy. I reviewed the medication with her. She has received confirmation of reimbursement aids from the drug manufacturer of Tezspire. I will pass this information along to the pharmacy. No recommendations for any changes at this time.   Butch Penny, PharmD, P[place]y [place], CPP Clinical Pharmacist [facility] [phone]

## 2023-05-12 ENCOUNTER — Other Ambulatory Visit (HOSPITAL_COMMUNITY): Payer: Self-pay

## 2023-05-12 ENCOUNTER — Other Ambulatory Visit: Payer: Self-pay

## 2023-05-15 ENCOUNTER — Ambulatory Visit (INDEPENDENT_AMBULATORY_CARE_PROVIDER_SITE_OTHER): Payer: 59 | Admitting: *Deleted

## 2023-05-15 DIAGNOSIS — J4550 Severe persistent asthma, uncomplicated: Secondary | ICD-10-CM | POA: Diagnosis not present

## 2023-05-21 ENCOUNTER — Other Ambulatory Visit (HOSPITAL_COMMUNITY): Payer: Self-pay

## 2023-05-25 ENCOUNTER — Other Ambulatory Visit: Payer: Self-pay

## 2023-05-27 ENCOUNTER — Other Ambulatory Visit: Payer: Self-pay

## 2023-05-27 ENCOUNTER — Encounter: Payer: Self-pay | Admitting: Adult Health

## 2023-05-27 ENCOUNTER — Encounter: Payer: Self-pay | Admitting: Sports Medicine

## 2023-05-27 ENCOUNTER — Other Ambulatory Visit (HOSPITAL_COMMUNITY): Payer: Self-pay

## 2023-05-27 ENCOUNTER — Other Ambulatory Visit: Payer: Self-pay | Admitting: Sports Medicine

## 2023-05-27 ENCOUNTER — Other Ambulatory Visit: Payer: Self-pay | Admitting: Adult Health

## 2023-05-27 DIAGNOSIS — M542 Cervicalgia: Secondary | ICD-10-CM

## 2023-05-27 DIAGNOSIS — G8929 Other chronic pain: Secondary | ICD-10-CM

## 2023-05-27 MED ORDER — VALACYCLOVIR HCL 500 MG PO TABS
500.0000 mg | ORAL_TABLET | Freq: Two times a day (BID) | ORAL | 0 refills | Status: DC
  Filled 2023-05-27: qty 180, 90d supply, fill #0

## 2023-05-27 MED ORDER — VALACYCLOVIR HCL 500 MG PO TABS
1000.0000 mg | ORAL_TABLET | Freq: Two times a day (BID) | ORAL | 0 refills | Status: DC
  Filled 2023-05-27 – 2023-09-11 (×4): qty 180, 45d supply, fill #0

## 2023-05-27 MED ORDER — GEMTESA 75 MG PO TABS
1.0000 | ORAL_TABLET | Freq: Every day | ORAL | 0 refills | Status: DC
  Filled 2023-05-27 – 2023-05-28 (×2): qty 90, 90d supply, fill #0

## 2023-05-27 MED ORDER — MELOXICAM 15 MG PO TABS
15.0000 mg | ORAL_TABLET | Freq: Every day | ORAL | 0 refills | Status: DC | PRN
Start: 2023-05-27 — End: 2023-08-04
  Filled 2023-05-27 – 2023-05-28 (×2): qty 30, 30d supply, fill #0

## 2023-05-28 ENCOUNTER — Other Ambulatory Visit (HOSPITAL_COMMUNITY): Payer: Self-pay

## 2023-05-28 ENCOUNTER — Other Ambulatory Visit: Payer: Self-pay

## 2023-05-28 NOTE — Progress Notes (Signed)
[facility] Park [facility] [address] [place] [address] Phone: [phone]   Assessment and Plan:    1. Osteoporosis, unspecified osteoporosis type, unspecified pathological fracture presence -Chronic, subsequent visit - Patient with T-score -2.9 on DEXA scan from 08/12/22 indicating osteoporosis - Will recheck labs including calcium, vitamin D, creatinine at today's visit-all labs are WNL, can start Fosamax treatment for osteoporosis - Would plan on rechecking labs 3 months after initiating Fosamax therapy.  Would repeat DEXA scan in 1 to 2 years after initiating Fosamax therapy - Patient has had GI issues in the past, however she was seen, evaluated, and cleared by GI - Educated that Fosamax must be taken alone in the morning with a 8 ounce cup of water and no further eating or drinking within 30 minutes of taking medication - Calcium, ionized; Future - VITAMIN D 25 Hydroxy (Vit-D Deficiency, Fractures); Future - Creatinine; Future  2. Neck pain 3. Chronic bilateral low back pain without sciatica 4. Somatic dysfunction of cervical region 5. Somatic dysfunction of thoracic region 6. Somatic dysfunction of lumbar region 7. Somatic dysfunction of pelvic region 8. Somatic dysfunction of rib region -Chronic with exacerbation, subsequent visit - Recurrence of multiple musculoskeletal complaints with most prominent being neck and low back.  Likely flared from patient's increased physical demands at work - Patient has received relief with OMT in the past.  Elects for repeat OMT today.  Tolerated well per note below. - Decision today to treat with OMT was based on Physical Exam  After verbal consent patient was treated with HVLA (high velocity low amplitude), ME (muscle energy), FPR (flex positional release), ST (soft tissue), PC/PD (Pelvic Compression/ Pelvic Decompression) techniques in cervical, rib, thoracic, lumbar, and pelvic areas. Patient tolerated the  procedure well with improvement in symptoms.  Patient educated on potential side effects of soreness and recommended to rest, hydrate, and use Tylenol as needed for pain control.        Pertinent previous records reviewed include none   Follow Up: 4 weeks for reevaluation.  Could review lab work and discuss starting Fosamax medication.  Could discuss repeat OMT versus trigger point injections based on presentation   Subjective:   I, [name], am serving as a Neurosurgeon for Doctor Richardean Sale   Chief Complaint: left shoulder pain and tightness   HPI:  01/31/2022 Patient is a 52 year old female complaining of neck and shoulder pain. Patient states been going on for about 2 weeks has knots in her shoulders both neck and shoulders have been stiff , interested in OMT , knots in her upper trap , low back and her sciatic nerve on her right side flares up when she's having intercourse goes down her leg and upper her back    02/21/2022 Patient states that her neck and her shoulders are still bothering her after her adjustment she was very tight is still tight in the upper back , still wants the adjustment will get a massage later tonight as well  , hip still hurts , her tailbone she woke up one morning with pain     03/06/2022 Patient states still has upper trap tightness, finder her self leaning on that left side    03/28/2022 Patient states that she is still tight time for an adjustment would like to discuss a lower spot for CSI    04/25/2022 Patient states that her neck is heavy, would like a referral for MRI, still neck tightness and  pain,   05/23/2022 Patient states she is ready for the OMT but the left shoulder clavicle have been sore, neck and shoulder still hurts. Patient has had the MRI of the ankle this morning. Patient wanting to know if she can get another shoulder injection today    06/20/2022 Patient states that she is a little better decreased ROM but is going to PT , ankle  feels better bought different shoes    08/08/2022  Patient states shoulder still hurts has been dry needling and want to talk talk about lidocaine again and then low back pain want referral for PT and sciatic pain    08/29/2022 Patient states wants OMT, knot still hurts    09/26/2022 Patient states that her shoulder still hurts , last week her bones were hurting , body is achy and painful    10/24/2022 Patient states that the bones and the back of her neck are getting tight and painful same spot as when she came in the first time, also the upper trap knot coming back    11/21/2022 Patient states neck and,  shoulder are tight , stopped PT , needs  tune up    12/19/2022 Patient states she is hurting everywhere today , neck and shoulder are stiff, needs a good stretch today    01/16/2023 Patient states neck , low back and hips , has been able to work out, neck knots have gotten better but still tight    02/13/2023 Patient states neck is still in pain right shoulder is tight needs an adjustment    03/20/2023 Patient states neck and spine tightness through thoracic , neck and shoulder decreased ROM,if possible injection or trigger points    04/09/2023 Patient states she has a migraine, back feels better from trigger points , thoracic and low back adjustment    05/01/2023 Patient states that she is having pain in L side over ribs. Felt a pop after boyfriend hugged her.   05/29/2023 Patient states needs an adjustment wants to know when she can do trigger points again. Low back pain today and neck     Relevant Historical Information: History of migraines being treated with Botox injections    Additional pertinent review of systems negative.  Current Outpatient Medications  Medication Sig Dispense Refill   acetaminophen (TYLENOL) 500 MG tablet Take by mouth.     acyclovir ointment (ZOVIRAX) 5 % Apply onto the affected area every 3 hours. 15 g 1   albuterol (VENTOLIN HFA) 108 (90 Base)  MCG/ACT inhaler Inhale 1 - 2 puffs into the lungs every 6 hours as needed for wheezing or shortness of breath. 6.7 g 1   alendronate (FOSAMAX) 70 MG tablet Take 1 tablet (70 mg total) by mouth every 7 (seven) days. 13 tablet 0   amitriptyline (ELAVIL) 10 MG tablet Take 1 tablet (10 mg total) by mouth at bedtime. 90 tablet 1   Azelastine-Fluticasone (DYMISTA) 137-50 MCG/ACT SUSP Place 2 sprays into both nostrils 2 (two) times daily as needed. 23 g 5   botulinum toxin Type A (BOTOX) 200 units injection Provider to inject 155 units into the muscles of the head and neck every 12 weeks. Discard remainder. 1 each 3   botulinum toxin Type A (BOTOX) 200 units injection Inject 155 units in neck and head muscles every 12 weeks Administrated by provider . Discard remaninig 1 each 3   CALCIUM PO Take 1 tablet by mouth 4 (four) times a week.     cetirizine (ZYRTEC)  10 MG tablet Take 2 tablets (20 mg total) by mouth 2 (two) times daily. 360 tablet 1   Cyanocobalamin (NASCOBAL NA) Place 1 spray into the nose every Sunday.     diazepam (VALIUM) 10 MG tablet Take 1 tablet (10 mg total) by mouth at bedtime as needed for sleep. 30 tablet 2   EPINEPHrine 0.3 mg/0.3 mL IJ SOAJ injection Inject 0.3 mg into the muscle as needed for anaphylaxis. 2 each 1   estradiol (ESTRACE) 2 MG tablet Take 1 tablet (2 mg total) by mouth daily. 90 tablet 12   estradiol (ESTRACE) 2 MG tablet Take 1 tablet (2 mg total) by mouth daily. 30 tablet 12   Estradiol Starter Pack (IMVEXXY STARTER PACK) 10 MCG INST Insert 1 soft gel daily for 2 weeks. After this, insert 1 soft gel twice weekly 18 each 1   fluconazole (DIFLUCAN) 150 MG tablet Take 1 tablet (150 mg total) by mouth daily. 14 tablet 5   Fluticasone-Umeclidin-Vilant (TRELEGY ELLIPTA) 200-62.5-25 MCG/ACT AEPB Inhale 1 puff into the lungs daily. 60 each 5   Galcanezumab-gnlm (EMGALITY) 120 MG/ML SOAJ Inject 120 mg into the skin every 30 (thirty) days. 3 mL 0   Hypertonic Nasal Wash (SINUS  RINSE REFILL) PACK Use one packet dissolved in water as needed. (Patient taking differently: Place 1 each into the nose in the morning and at bedtime.) 100 each 5   ibuprofen (ADVIL) 800 MG tablet Take 1 tablet (800 mg total) by mouth 3 (three) times daily as needed (pain.). 270 tablet 0   ipratropium (ATROVENT) 0.06 % nasal spray Place 2 sprays into both nostrils 4 (four) times daily. 45 mL 3   linaclotide (LINZESS) 145 MCG CAPS capsule Take 1 capsule (145 mcg total) by mouth daily before breakfast. 60 capsule 3   linaclotide (LINZESS) 290 MCG CAPS capsule Take 1 capsule (290 mcg total) by mouth daily before breakfast. 90 capsule 1   meclizine (ANTIVERT) 25 MG tablet Take 25 mg by mouth 3 (three) times daily as needed (dizziness/vertigo).      meloxicam (MOBIC) 15 MG tablet Take 1 tablet (15 mg total) by mouth daily as needed for pain. 30 tablet 0   ondansetron (ZOFRAN-ODT) 4 MG disintegrating tablet Dissolve 1-2 tablets (4-8 mg total) by mouth every 8 (eight) hours as needed. May take with Rizatriptan 90 tablet 3   pantoprazole (PROTONIX) 40 MG tablet Take 1 tablet (40 mg total) by mouth at bedtime. 90 tablet 3   Pediatric Multiple Vit-C-FA (MULTIVITAMIN ANIMAL SHAPES, WITH CA/FA,) with C & FA chewable tablet Chew 2 tablets by mouth daily.     Rimegepant Sulfate (NURTEC) 75 MG TBDP Take 1 tablet (75 mg total) by mouth daily as needed. 16 tablet 11   rizatriptan (MAXALT-MLT) 10 MG disintegrating tablet Dissolve 1 tablet (10 mg total) by mouth as needed at onset of migraine. May take another tablet 2 hours later if needed, max 2 tablets in 24 hours. 27 tablet 3   simethicone (MYLICON) 80 MG chewable tablet Chew 80 mg by mouth every 6 (six) hours as needed for flatulence.     tezepelumab-ekko (TEZSPIRE) 210 MG/1. syringe Inject 1.91 mLs (210 mg total) into the skin every 28 (twenty-eight) days. 1.91 mL 11   valACYclovir (VALTREX) 500 MG tablet Take 2 tablets (1,000 mg total) by mouth 2 (two) times  daily. 180 tablet 0   Vibegron (GEMTESA) 75 MG TABS Take 1 tablet (75 mg total) by mouth daily. 90 tablet 0  Current Facility-Administered Medications  Medication Dose Route Frequency Provider Last Rate Last Admin   tezepelumab-ekko (TEZSPIRE) 210 MG/1. syringe 210 mg  210 mg Subcutaneous Q28 days Alfonse Spruce, MD   210 mg at 05/15/23 1007      Objective:     Vitals:   05/29/23 1034  BP: 110/80  Pulse: 97  SpO2: 98%  Weight: 181 lb (82.1 kg)  Height: 5\' 6"  (1.676 m)      Body mass index is 29.21 kg/m.    Physical Exam:     General: Well-appearing, cooperative, sitting comfortably in no acute distress.   OMT Physical Exam:  ASIS Compression Test: Positive Right Cervical: TTP paraspinal, tight musculature bilaterally radiating to trapezius Rib: Bilateral elevated first rib with TTP Thoracic: TTP paraspinal, T6 RRSR Lumbar: TTP paraspinal, L2 RLSL Pelvis: Right anterior innominate  Electronically signed by:  Donna Park [facility] 11:02 AM 05/29/23

## 2023-05-29 ENCOUNTER — Other Ambulatory Visit (HOSPITAL_COMMUNITY): Payer: Self-pay

## 2023-05-29 ENCOUNTER — Ambulatory Visit (INDEPENDENT_AMBULATORY_CARE_PROVIDER_SITE_OTHER): Payer: 59 | Admitting: Sports Medicine

## 2023-05-29 VITALS — BP 110/80 | HR 97 | Ht 66.0 in | Wt 181.0 lb

## 2023-05-29 DIAGNOSIS — M9902 Segmental and somatic dysfunction of thoracic region: Secondary | ICD-10-CM | POA: Diagnosis not present

## 2023-05-29 DIAGNOSIS — M9901 Segmental and somatic dysfunction of cervical region: Secondary | ICD-10-CM

## 2023-05-29 DIAGNOSIS — M810 Age-related osteoporosis without current pathological fracture: Secondary | ICD-10-CM

## 2023-05-29 DIAGNOSIS — M9903 Segmental and somatic dysfunction of lumbar region: Secondary | ICD-10-CM | POA: Diagnosis not present

## 2023-05-29 DIAGNOSIS — G8929 Other chronic pain: Secondary | ICD-10-CM | POA: Diagnosis not present

## 2023-05-29 DIAGNOSIS — M9905 Segmental and somatic dysfunction of pelvic region: Secondary | ICD-10-CM

## 2023-05-29 DIAGNOSIS — M542 Cervicalgia: Secondary | ICD-10-CM | POA: Diagnosis not present

## 2023-05-29 DIAGNOSIS — M545 Low back pain, unspecified: Secondary | ICD-10-CM | POA: Diagnosis not present

## 2023-05-29 DIAGNOSIS — M9908 Segmental and somatic dysfunction of rib cage: Secondary | ICD-10-CM

## 2023-05-29 LAB — VITAMIN D 25 HYDROXY (VIT D DEFICIENCY, FRACTURES): VITD: 34.78 ng/mL (ref 30.00–100.00)

## 2023-05-29 LAB — CREATININE, SERUM: Creatinine, Ser: 0.77 mg/dL (ref 0.40–1.20)

## 2023-05-31 LAB — CALCIUM, IONIZED: Calcium, Ion: 4.8 mg/dL (ref 4.7–5.5)

## 2023-06-18 ENCOUNTER — Ambulatory Visit (INDEPENDENT_AMBULATORY_CARE_PROVIDER_SITE_OTHER): Payer: 59

## 2023-06-18 DIAGNOSIS — J4550 Severe persistent asthma, uncomplicated: Secondary | ICD-10-CM

## 2023-06-25 ENCOUNTER — Other Ambulatory Visit: Payer: Self-pay

## 2023-06-25 ENCOUNTER — Other Ambulatory Visit: Payer: Self-pay | Admitting: Gastroenterology

## 2023-06-25 ENCOUNTER — Other Ambulatory Visit: Payer: Self-pay | Admitting: Neurology

## 2023-06-25 ENCOUNTER — Other Ambulatory Visit: Payer: Self-pay | Admitting: Adult Health

## 2023-06-25 ENCOUNTER — Other Ambulatory Visit: Payer: Self-pay | Admitting: Allergy & Immunology

## 2023-06-25 ENCOUNTER — Encounter: Payer: Self-pay | Admitting: Adult Health

## 2023-06-25 DIAGNOSIS — J4541 Moderate persistent asthma with (acute) exacerbation: Secondary | ICD-10-CM

## 2023-06-25 MED ORDER — EMGALITY 120 MG/ML ~~LOC~~ SOAJ
120.0000 mg | SUBCUTANEOUS | 3 refills | Status: DC
  Filled 2023-06-25: qty 3, 90d supply, fill #0
  Filled 2023-07-10 – 2023-10-09 (×2): qty 3, 90d supply, fill #1
  Filled 2024-02-19 (×2): qty 3, 90d supply, fill #2

## 2023-06-25 MED ORDER — IPRATROPIUM BROMIDE 0.06 % NA SOLN
2.0000 | Freq: Four times a day (QID) | NASAL | 5 refills | Status: DC
Start: 2023-06-25 — End: 2024-11-01
  Filled 2023-06-25: qty 45, 90d supply, fill #0
  Filled 2023-06-29: qty 45, 30d supply, fill #0
  Filled 2023-10-09: qty 45, 30d supply, fill #1
  Filled 2023-11-12: qty 45, 30d supply, fill #2
  Filled 2024-06-17: qty 45, 30d supply, fill #3

## 2023-06-25 MED ORDER — LINACLOTIDE 290 MCG PO CAPS
290.0000 ug | ORAL_CAPSULE | Freq: Every day | ORAL | 1 refills | Status: DC
  Filled 2023-06-25: qty 90, 90d supply, fill #0
  Filled 2023-10-09: qty 90, 90d supply, fill #1

## 2023-06-25 NOTE — Progress Notes (Signed)
Donna Park D.[name] [address] [place] [address] Phone: [phone]   Assessment and Plan:     1. Neck pain - chronic with exacerbation, subsequent visit - continued bilateral neck and upper back/trapezius muscular pain - patient has had benefit from trigger point injections in the past and requests repeat injections today. Tolerated well per note below  Trigger Point Injection: After informed consent was obtained, skin cleaned with alcohol  prep.  A total of 10 trigger points identified along bilateral cervical paraspinal, thoracic paraspinal, trapezius, rhomboid.  Injections given over area of pain for total injection of 2mL kenalog 40mg /mL and 8 ml lidocaine 1% w/o epi.  Patient had relief after the injection without side effects.  Pt given signs of infection to watch for.   2. Osteoporosis without current pathological fracture, unspecified osteoporosis type -Chronic, subsequent visit - Patient with T-score -2.9 on DEXA scan from 08/12/22 indicating osteoporosis -   calcium, vitamin D, creatinine   are WNL, can start Fosamax treatment for osteoporosis - Would plan on rechecking labs 3 months after initiating Fosamax therapy.  Would repeat DEXA scan in 1 to 2 years after initiating Fosamax therapy - Patient has had GI issues in the past, however she was seen, evaluated, and cleared by GI - Educated that Fosamax must be taken alone in the morning with a 8 ounce cup of water and no further eating or drinking within 30 minutes of taking medication  - alendronate (FOSAMAX) 70 MG tablet; Take 1 tablet (70 mg total) by mouth every 7 (seven) days.  Dispense: 13 tablet; Refill: 0   Pertinent previous records reviewed include vit d, creatinine, calcium levels   Follow Up: 4 weeks for reevaluation, can consider repeat OMT     Subjective:   I, Donna Park, am serving as a Neurosurgeon for Doctor Richardean Sale   Chief Complaint: left shoulder pain and  tightness   HPI:  01/31/2022 Patient is a 52 year old female complaining of neck and shoulder pain. Patient states been going on for about 2 weeks has knots in her shoulders both neck and shoulders have been stiff , interested in OMT , knots in her upper trap , low back and her sciatic nerve on her right side flares up when she's having intercourse goes down her leg and upper her back    02/21/2022 Patient states that her neck and her shoulders are still bothering her after her adjustment she was very tight is still tight in the upper back , still wants the adjustment will get a massage later tonight as well  , hip still hurts , her tailbone she woke up one morning with pain     03/06/2022 Patient states still has upper trap tightness, finder her self leaning on that left side    03/28/2022 Patient states that she is still tight time for an adjustment would like to discuss a lower spot for CSI    04/25/2022 Patient states that her neck is heavy, would like a referral for MRI, still neck tightness and pain,   05/23/2022 Patient states she is ready for the OMT but the left shoulder clavicle have been sore, neck and shoulder still hurts. Patient has had the MRI of the ankle this morning. Patient wanting to know if she can get another shoulder injection today    06/20/2022 Patient states that she is a little better decreased ROM but is going to PT , ankle feels better bought  different shoes    08/08/2022  Patient states shoulder still hurts has been dry needling and want to talk talk about lidocaine again and then low back pain want referral for PT and sciatic pain    08/29/2022 Patient states wants OMT, knot still hurts    09/26/2022 Patient states that her shoulder still hurts , last week her bones were hurting , body is achy and painful    10/24/2022 Patient states that the bones and the back of her neck are getting tight and painful same spot as when she came in the first time, also the upper  trap knot coming back    11/21/2022 Patient states neck and,  shoulder are tight , stopped PT , needs  tune up    12/19/2022 Patient states she is hurting everywhere today , neck and shoulder are stiff, needs a good stretch today    01/16/2023 Patient states neck , low back and hips , has been able to work out, neck knots have gotten better but still tight    02/13/2023 Patient states neck is still in pain right shoulder is tight needs an adjustment    03/20/2023 Patient states neck and spine tightness through thoracic , neck and shoulder decreased ROM,if possible injection or trigger points    04/09/2023 Patient states she has a migraine, back feels better from trigger points , thoracic and low back adjustment    05/01/2023 Patient states that she is having pain in L side over ribs. Felt a pop after boyfriend hugged her.    05/29/2023 Patient states needs an adjustment wants to know when she can do trigger points again. Low back pain today and neck    06/26/2023 Patient states neck pain is starting to come back    Relevant Historical Information: History of migraines being treated with Botox injections  Additional pertinent review of systems negative.  Current Outpatient Medications  Medication Sig Dispense Refill   acetaminophen (TYLENOL) 500 MG tablet Take by mouth.     acyclovir ointment (ZOVIRAX) 5 % Apply onto the affected area every 3 hours. 15 g 1   albuterol (VENTOLIN HFA) 108 (90 Base) MCG/ACT inhaler Inhale 1 - 2 puffs into the lungs every 6 hours as needed for wheezing or shortness of breath. 6.7 g 1   amitriptyline (ELAVIL) 10 MG tablet Take 1 tablet (10 mg total) by mouth at bedtime. 90 tablet 1   Azelastine-Fluticasone (DYMISTA) 137-50 MCG/ACT SUSP Place 2 sprays into both nostrils 2 (two) times daily as needed. 23 g 5   botulinum toxin Type A (BOTOX) 200 units injection Provider to inject 155 units into the muscles of the head and neck every 12 weeks. Discard remainder. 1  each 3   botulinum toxin Type A (BOTOX) 200 units injection Inject 155 units in neck and head muscles every 12 weeks Administrated by provider . Discard remaninig 1 each 3   CALCIUM PO Take 1 tablet by mouth 4 (four) times a week.     cetirizine (ZYRTEC) 10 MG tablet Take 2 tablets (20 mg total) by mouth 2 (two) times daily. 360 tablet 1   Cyanocobalamin (NASCOBAL NA) Place 1 spray into the nose every Sunday.     diazepam (VALIUM) 10 MG tablet Take 1 tablet (10 mg total) by mouth at bedtime as needed for sleep. 30 tablet 2   EPINEPHrine 0.3 mg/0.3 mL IJ SOAJ injection Inject 0.3 mg into the muscle as needed for anaphylaxis. 2 each 1   estradiol (  ESTRACE) 2 MG tablet Take 1 tablet (2 mg total) by mouth daily. 90 tablet 12   estradiol (ESTRACE) 2 MG tablet Take 1 tablet (2 mg total) by mouth daily. 30 tablet 12   Estradiol Starter Pack (IMVEXXY STARTER PACK) 10 MCG INST Insert 1 soft gel daily for 2 weeks. After this, insert 1 soft gel twice weekly 18 each 1   fluconazole (DIFLUCAN) 150 MG tablet Take 1 tablet (150 mg total) by mouth daily. 14 tablet 5   Fluticasone-Umeclidin-Vilant (TRELEGY ELLIPTA) 200-62.5-25 MCG/ACT AEPB Inhale 1 puff into the lungs daily. 60 each 5   Galcanezumab-gnlm (EMGALITY) 120 MG/ML SOAJ Inject 120 mg into the skin every 30 (thirty) days. 3 mL 3   Hypertonic Nasal Wash (SINUS RINSE REFILL) PACK Use one packet dissolved in water as needed. (Patient taking differently: Place 1 each into the nose in the morning and at bedtime.) 100 each 5   ibuprofen (ADVIL) 800 MG tablet Take 1 tablet (800 mg total) by mouth 3 (three) times daily as needed (pain.). 270 tablet 0   ipratropium (ATROVENT) 0.06 % nasal spray Place 2 sprays into both nostrils 4 (four) times daily. 45 mL 5   linaclotide (LINZESS) 145 MCG CAPS capsule Take 1 capsule (145 mcg total) by mouth daily before breakfast. 60 capsule 3   linaclotide (LINZESS) 290 MCG CAPS capsule Take 1 capsule (290 mcg total) by mouth daily  before breakfast. 90 capsule 1   meclizine (ANTIVERT) 25 MG tablet Take 25 mg by mouth 3 (three) times daily as needed (dizziness/vertigo).      meloxicam (MOBIC) 15 MG tablet Take 1 tablet (15 mg total) by mouth daily as needed for pain. 30 tablet 0   ondansetron (ZOFRAN-ODT) 4 MG disintegrating tablet Dissolve 1-2 tablets (4-8 mg total) by mouth every 8 (eight) hours as needed. May take with Rizatriptan 90 tablet 3   pantoprazole (PROTONIX) 40 MG tablet Take 1 tablet (40 mg total) by mouth at bedtime. 90 tablet 3   Pediatric Multiple Vit-C-FA (MULTIVITAMIN ANIMAL SHAPES, WITH CA/FA,) with C & FA chewable tablet Chew 2 tablets by mouth daily.     Rimegepant Sulfate (NURTEC) 75 MG TBDP Take 1 tablet (75 mg total) by mouth daily as needed. 16 tablet 11   rizatriptan (MAXALT-MLT) 10 MG disintegrating tablet Dissolve 1 tablet (10 mg total) by mouth as needed at onset of migraine. May take another tablet 2 hours later if needed, max 2 tablets in 24 hours. 27 tablet 3   simethicone (MYLICON) 80 MG chewable tablet Chew 80 mg by mouth every 6 (six) hours as needed for flatulence.     tezepelumab-ekko (TEZSPIRE) 210 MG/1. syringe Inject 1.91 mLs (210 mg total) into the skin every 28 (twenty-eight) days. 1.91 mL 11   valACYclovir (VALTREX) 500 MG tablet Take 2 tablets (1,000 mg total) by mouth 2 (two) times daily. 180 tablet 0   Vibegron (GEMTESA) 75 MG TABS Take 1 tablet (75 mg total) by mouth daily. 90 tablet 0   Current Facility-Administered Medications  Medication Dose Route Frequency Provider Last Rate Last Admin   tezepelumab-ekko (TEZSPIRE) 210 MG/1. syringe 210 mg  210 mg Subcutaneous Q28 days Alfonse Spruce, MD   210 mg at 06/18/23 1744      Objective:     Vitals:   06/26/23 1007  BP: 122/72  Pulse: 73  SpO2: 99%  Weight: 184 lb (83.5 kg)  Height: 5\' 6"  (1.676 m)      Body mass index  is 29.7 kg/m.    Physical Exam:     Neck Exam: Cervical Spine- Posture normal Skin-  normal, intact   Neuro:  Strength-   Right Left  Deltoid (C5) 5/5 5/5 Bicep/Brachioradialis (C5/6) 5/5  5/5 Wrist Extension (C6) 5/5 5/5 Tricep (C7) 5/5 5/5 Wrist Flexion (C7) 5/5 5/5 Grip (C8) 5/5 5/5 Finger Abduction (T1) 5/5 5/5   Sensation: intact to light touch in upper extremities bilaterally   Spurling's:  negative bilaterally Neck ROM: Full active ROM TTP: cervical spinous processes, and bilateral cervical paraspinal, thoracic paraspinal, trapezius, rhomboid   Electronically signed by:  Donna Park D.[name] 10:49 AM 06/26/23

## 2023-06-26 ENCOUNTER — Other Ambulatory Visit (HOSPITAL_COMMUNITY): Payer: Self-pay

## 2023-06-26 ENCOUNTER — Other Ambulatory Visit: Payer: Self-pay

## 2023-06-26 ENCOUNTER — Ambulatory Visit: Payer: 59 | Admitting: Sports Medicine

## 2023-06-26 VITALS — BP 122/72 | HR 73 | Ht 66.0 in | Wt 184.0 lb

## 2023-06-26 DIAGNOSIS — M542 Cervicalgia: Secondary | ICD-10-CM | POA: Diagnosis not present

## 2023-06-26 DIAGNOSIS — M810 Age-related osteoporosis without current pathological fracture: Secondary | ICD-10-CM

## 2023-06-26 MED ORDER — ALENDRONATE SODIUM 70 MG PO TABS
70.0000 mg | ORAL_TABLET | ORAL | 0 refills | Status: DC
Start: 2023-06-26 — End: 2023-06-26
  Filled 2023-06-26: qty 16, 112d supply, fill #0

## 2023-06-26 MED ORDER — ACYCLOVIR 5 % EX OINT
1.0000 | TOPICAL_OINTMENT | CUTANEOUS | 1 refills | Status: DC
  Filled 2023-06-26: qty 15, 2d supply, fill #0
  Filled 2023-10-09: qty 15, 2d supply, fill #1

## 2023-06-26 MED ORDER — PANTOPRAZOLE SODIUM 40 MG PO TBEC
40.0000 mg | DELAYED_RELEASE_TABLET | Freq: Every day | ORAL | 3 refills | Status: DC
  Filled 2023-06-26: qty 90, 90d supply, fill #0
  Filled 2023-10-09: qty 90, 90d supply, fill #1
  Filled 2024-06-17 (×2): qty 90, 90d supply, fill #2

## 2023-06-26 NOTE — Patient Instructions (Addendum)
4 week follow up [facility]  Start fosamax 70 mg every 7 days  Continue vitamin D and calcium supplements

## 2023-06-29 ENCOUNTER — Other Ambulatory Visit: Payer: Self-pay

## 2023-06-29 ENCOUNTER — Other Ambulatory Visit (HOSPITAL_COMMUNITY): Payer: Self-pay

## 2023-06-29 MED ORDER — AMITRIPTYLINE HCL 10 MG PO TABS
10.0000 mg | ORAL_TABLET | Freq: Every day | ORAL | 1 refills | Status: AC
  Filled 2023-06-29: qty 90, 90d supply, fill #0
  Filled 2023-10-09: qty 90, 90d supply, fill #1

## 2023-06-30 ENCOUNTER — Other Ambulatory Visit: Payer: Self-pay

## 2023-07-01 ENCOUNTER — Encounter (HOSPITAL_COMMUNITY): Payer: Self-pay

## 2023-07-01 ENCOUNTER — Other Ambulatory Visit (HOSPITAL_COMMUNITY): Payer: Self-pay

## 2023-07-03 ENCOUNTER — Other Ambulatory Visit (HOSPITAL_COMMUNITY): Payer: Self-pay

## 2023-07-03 ENCOUNTER — Telehealth: Payer: Self-pay

## 2023-07-03 ENCOUNTER — Ambulatory Visit: Payer: 59 | Admitting: Cardiovascular Disease

## 2023-07-03 ENCOUNTER — Encounter: Payer: Self-pay | Admitting: Cardiovascular Disease

## 2023-07-03 VITALS — BP 138/74 | HR 80 | Ht 66.0 in | Wt 177.0 lb

## 2023-07-03 DIAGNOSIS — R000 Tachycardia, unspecified: Secondary | ICD-10-CM | POA: Diagnosis not present

## 2023-07-03 DIAGNOSIS — R0989 Other specified symptoms and signs involving the circulatory and respiratory systems: Secondary | ICD-10-CM

## 2023-07-03 DIAGNOSIS — Z8249 Family history of ischemic heart disease and other diseases of the circulatory system: Secondary | ICD-10-CM | POA: Diagnosis not present

## 2023-07-03 NOTE — Progress Notes (Signed)
Cardiology Office Note:  .   Date:  07/04/2023  ID:  Verneita Griffes, DOB [birth year], MRN [id number] PCP: Shirline Frees, NP  [facility] Providers Cardiologist:  None    History of Present Illness: .   Donna Park is a 52 y.o. female who is being seen today for the evaluation of tachycardia at the request of Shirline Frees, NP.   Donna Park underwent bariatric surgery with a Roux-en-Y gastric ileal bypass in 2016.  She has lost 160 pounds since then, and her current BMI is only 28.  Over the last several months, maybe year she has noticed repeated problems with rapid heart rates, nausea and dizziness after eating.  The symptoms typically happen within 30 minutes of a meal.  She tries to eat a healthy diet, by which she means that she avoids fatty foods and fast foods and tries to eat a lot of vegetables.  She has noticed that the symptoms are definitely worse if she drinks fluids during her meal but she does try to drink lots of fluids throughout the day.  She has never experienced true syncope.  She has had near syncopal symptoms if she tries to take a warm shower after a meal.  The rapid heart rate improves gradually and gets better quickly if she lays down.  We reviewed several symptom-driven ECG tracings from her smart watch which all show sinus tachycardia.  She denies dyspnea at rest or with activity, orthopnea, PND, lower extremity edema, chest pain at rest or with activity and does not have palpitations outside of the immediate postprandial complaints.  She does not have focal neurological complaints or intermittent claudication.  She has had previous varicose vein ablation in both lower extremities.  She is very concerned about her strong family history of premature death from vascular problems.  Father was a smoker and had bypass surgery in his 77s and eventually died of an aneurysm at age 66 (it sounds like he may have had a ruptured abdominal aortic aneurysm).  Her mother  had bypass surgery at age 42 but is now alive at age 42.  She has 2 maternal uncles who had heart problems in their early 32s.  She has never smoked.  Even when she was morbidly obese she did not have diabetes.  Her lipid profile is excellent (HDL 79, LDL 77) without medications.  She does not have hypertension.  Her initial blood pressure was a little high in the office today, but was normal on recheck.  She does have history of situational hypertension.  Recent normal TSH.  ROS: Denies diarrhea as long as she is careful with her meals.  No recent weight changes.  Denies intolerance to heat, skin or hair changes, voice changes, tremor.  Before her bypass surgery she had severe problems with asthma, now greatly improved.  Studies Reviewed: Marland Kitchen   EKG Interpretation Date/Time:  Friday July 03 2023 14:27:15 EDT Ventricular Rate:  80 PR Interval:  158 QRS Duration:  84 QT Interval:  384 QTC Calculation: 442 R Axis:   42  Text Interpretation: Normal sinus rhythm Biatrial enlargement No previous ECGs available Confirmed by Sy Saintjean ([id number]) on 07/03/2023 2:34:30 PM    Venous Doppler study 2020 Risk Assessment/Calculations:   04/24/2023 Cholesterol 166, HDL 79, LDL 77, triglycerides 45, hemoglobin A1c 5.4%, hemoglobin 13.1, creatinine 0.77, potassium 4.7, TSH 2.05         Physical Exam:   VS:  BP 138/74   Pulse 80  Ht 5\' 6"  (1.676 m)   Wt 177 lb (80.3 kg)   SpO2 99%   BMI 28.57 kg/m    Wt Readings from Last 3 Encounters:  07/03/23 177 lb (80.3 kg)  06/26/23 184 lb (83.5 kg)  05/29/23 181 lb (82.1 kg)    GEN: Well nourished, well developed in no acute distress NECK: No JVD; possible faint carotid bruits, but I think I am actually hearing a venous hum CARDIAC: RRR, no murmurs, rubs, gallops RESPIRATORY:  Clear to auscultation without rales, wheezing or rhonchi  ABDOMEN: Soft, non-tender, non-distended EXTREMITIES:  No edema; No deformity   ASSESSMENT AND PLAN: .     Tachycardia: This consistently occurs postprandially and not with other activities.  It is highly consistent with physiological sinus tachycardia likely related to dumping syndrome.  There is no evidence of a true arrhythmia or underlying cardiac illness.  We discussed the mechanism of tachycardia, related to sudden hemodynamic changes when eating meals with high osmotic concentration.  She should not drink fluids during the meal and for at least 30 minutes after eating a meal.  Avoid osmotic lean concentrated drinks such as sugary drinks, juices, salty foods.  Eat small meals frequently.  Drink plenty of fluids outside of the mealtime.  Referred her to sources on the Internet for dumping syndrome patients with previous gastric bypass.  I told her that I know that some medications may help with dumping syndrome complaints, but I would refer her to specialist to discuss these.  She would be best served by seeing a gastroenterologist or bariatric surgeon will experience any patients with previous gastric bypass. Family history of premature CAD and AAA: I am not surprised that she is concerned about her personal risk for vascular disease, but I am encouraged by the fact that she has excellent metabolic parameters and never really had diabetes even when she was morbidly obese we will schedule her for a AAA ultrasound scan.  Will also schedule her for carotid duplex ultrasound due to her physical exam.  I think she will get the best prognostic information regarding future risk of heart attack and other vascular problems from a coronary calcium score and we will order that as well.  Unless we identify significant abnormalities on any of the studies, no indication for cardiology follow-up at this time.       Dispo: AAA vascular screen, carotid dopplers, coronary calcium score.  Signed, Thurmon Fair, MD

## 2023-07-03 NOTE — Telephone Encounter (Signed)
Pharmacy Patient Advocate Encounter   Received notification from Patient Pharmacy that prior authorization for Emgality 120MG /ML auto-injectors (migraine) is required/requested.   Insurance verification completed.   The patient is insured through [facility] .   Per test claim: PA required; PA submitted to [facility] via CoverMyMeds Key/confirmation #/EOC  [id number] Status is pending

## 2023-07-03 NOTE — Patient Instructions (Signed)
Medication Instructions:  No changes *If you need a refill on your cardiac medications before your next appointment, please call your pharmacy*  Testing/Procedures: Your physician has requested that you have a carotid duplex. This test is an ultrasound of the carotid arteries in your neck. It looks at blood flow through these arteries that supply the brain with blood. Allow one hour for this exam. There are no restrictions or special instructions.   Your physician has requested that you have an abdominal aorta duplex. During this test, an ultrasound is used to evaluate the aorta. Allow 30 minutes for this exam. Do not eat after midnight the day before and avoid carbonated beverages   Dr Royann Shivers has ordered a CT coronary calcium score.   Test locations:  [facility]  [place]  This is $99 out of pocket.   Coronary CalciumScan A coronary calcium scan is an imaging test used to look for deposits of calcium and other fatty materials (plaques) in the inner lining of the blood vessels of the heart (coronary arteries). These deposits of calcium and plaques can partly clog and narrow the coronary arteries without producing any symptoms or warning signs. This puts a person at risk for a heart attack. This test can detect these deposits before symptoms develop. Tell a health care provider about: Any allergies you have. All medicines you are taking, including vitamins, herbs, eye drops, creams, and over-the-counter medicines. Any problems you or family members have had with anesthetic medicines. Any blood disorders you have. Any surgeries you have had. Any medical conditions you have. Whether you are pregnant or may be pregnant. What are the risks? Generally, this is a safe procedure. However, problems may occur, including: Harm to a pregnant woman and her unborn baby. This test involves the use of  radiation. Radiation exposure can be dangerous to a pregnant woman and her unborn baby. If you are pregnant, you generally should not have this procedure done. Slight increase in the risk of cancer. This is because of the radiation involved in the test. What happens before the procedure? No preparation is needed for this procedure. What happens during the procedure? You will undress and remove any jewelry around your neck or chest. You will put on a hospital gown. Sticky electrodes will be placed on your chest. The electrodes will be connected to an electrocardiogram (ECG) machine to record a tracing of the electrical activity of your heart. A CT scanner will take pictures of your heart. During this time, you will be asked to lie still and hold your breath for 2-3 seconds while a picture of your heart is being taken. The procedure may vary among health care providers and hospitals. What happens after the procedure? You can get dressed. You can return to your normal activities. It is up to you to get the results of your test. Ask your health care provider, or the department that is doing the test, when your results will be ready. Summary A coronary calcium scan is an imaging test used to look for deposits of calcium and other fatty materials (plaques) in the inner lining of the blood vessels of the heart (coronary arteries). Generally, this is a safe procedure. Tell your health care provider if you are pregnant or may be pregnant. No preparation is needed for this procedure. A CT scanner will take pictures of your heart. You can return to your normal activities after the scan  is done. This information is not intended to replace advice given to you by your health care provider. Make sure you discuss any questions you have with your health care provider. Document Released: 05/22/2008 Document Revised: 10/13/2016 Document Reviewed: 10/13/2016 Elsevier Interactive Patient Education  2017 [employer].    Follow-Up: At [facility], you and your health needs are our priority.  As part of our continuing mission to provide you with exceptional heart care, we have created designated Provider Care Teams.  These Care Teams include your primary Cardiologist (physician) and Advanced Practice Providers (APPs -  Physician Assistants and Nurse Practitioners) who all work together to provide you with the care you need, when you need it.  We recommend signing up for the patient portal called "MyChart".  Sign up information is provided on this After Visit Summary.  MyChart is used to connect with patients for Virtual Visits (Telemedicine).  Patients are able to view lab/test results, encounter notes, upcoming appointments, etc.  Non-urgent messages can be sent to your provider as well.   To learn more about what you can do with MyChart, go to [email or web address].    Your next appointment:    Follow up as needed  Provider:   Dr Royann Shivers

## 2023-07-04 ENCOUNTER — Encounter: Payer: Self-pay | Admitting: Cardiovascular Disease

## 2023-07-06 ENCOUNTER — Other Ambulatory Visit: Payer: Self-pay

## 2023-07-06 ENCOUNTER — Other Ambulatory Visit (HOSPITAL_COMMUNITY): Payer: Self-pay

## 2023-07-07 ENCOUNTER — Other Ambulatory Visit: Payer: Self-pay

## 2023-07-07 ENCOUNTER — Other Ambulatory Visit (HOSPITAL_COMMUNITY): Payer: Self-pay

## 2023-07-08 ENCOUNTER — Other Ambulatory Visit: Payer: Self-pay

## 2023-07-08 ENCOUNTER — Encounter: Payer: Self-pay | Admitting: Adult Health

## 2023-07-08 ENCOUNTER — Other Ambulatory Visit (HOSPITAL_COMMUNITY): Payer: Self-pay

## 2023-07-08 ENCOUNTER — Encounter (INDEPENDENT_AMBULATORY_CARE_PROVIDER_SITE_OTHER): Payer: Self-pay

## 2023-07-08 ENCOUNTER — Ambulatory Visit (INDEPENDENT_AMBULATORY_CARE_PROVIDER_SITE_OTHER): Payer: 59 | Admitting: Adult Health

## 2023-07-08 VITALS — BP 120/80 | HR 94 | Temp 98.3°F | Ht 66.0 in | Wt 175.0 lb

## 2023-07-08 DIAGNOSIS — Z114 Encounter for screening for human immunodeficiency virus [HIV]: Secondary | ICD-10-CM | POA: Diagnosis not present

## 2023-07-08 DIAGNOSIS — B370 Candidal stomatitis: Secondary | ICD-10-CM

## 2023-07-08 MED ORDER — NYSTATIN 100000 UNIT/ML MT SUSP
5.0000 mL | Freq: Four times a day (QID) | OROMUCOSAL | 0 refills | Status: DC
Start: 2023-07-08 — End: 2024-09-23
  Filled 2023-07-08: qty 473, 24d supply, fill #0

## 2023-07-08 NOTE — Addendum Note (Signed)
Addended by: Marian Sorrow D on: 07/08/2023 03:01 PM   Modules accepted: Orders

## 2023-07-08 NOTE — Telephone Encounter (Signed)
Pharmacy Patient Advocate Encounter  Received notification from [facility] that Prior Authorization for Emgality 120MG /ML auto-injectors (migraine) has been APPROVED from 07/07/2023 to 07/05/2024. Ran test claim, Copay is $Unable to run test claim-RX is already in the process to be filled at [facility]  PA #/Case ID/Reference #: [id number]

## 2023-07-08 NOTE — Progress Notes (Signed)
Subjective:    Patient ID: Donna Park, female    DOB: May 30, 1971, 52 y.o.   MRN: [id number]  Rash   52 year old female who  has a past medical history of Allergy, Anxiety, Arthritis, Asthma, Chicken pox, Complication of anesthesia, Depression, DJD (degenerative joint disease), GERD (gastroesophageal reflux disease), H/O gastric bypass (2016), History of iron deficiency anemia, HSV infection, Migraines, Obese, Pneumonia, PONV (postoperative nausea and vomiting), Status post dilation of esophageal narrowing, and Varicose vein of leg.  She presents to the office today for concern of " rash in her mouth" Symptoms started about 3 weeks ago and she felt something sore under her tongue, then on the inside of her cheek and finally saw a white irritated patch on her tongue. She is rinsing her mouth with water after using her inhaler.  Review of Systems  Skin:  Positive for rash.   See HPI   Past Medical History:  Diagnosis Date   Allergy    Anxiety    Arthritis    Asthma    Chicken pox    Complication of anesthesia    slow to wake up from anesthesia   Depression    DJD (degenerative joint disease)    GERD (gastroesophageal reflux disease)    resolved after gastric surgery   H/O gastric bypass 2016   History of iron deficiency anemia    HSV infection    Migraines    Obese    Pneumonia    Childhood history   PONV (postoperative nausea and vomiting)    Status post dilation of esophageal narrowing    Varicose vein of leg     Social History   Socioeconomic History   Marital status: Married    Spouse name: Not on file   Number of children: 4   Years of education: Not on file   Highest education level: Associate degree: academic program  Occupational History   Not on file  Tobacco Use   Smoking status: Never   Smokeless tobacco: Never  Vaping Use   Vaping status: Never Used  Substance and Sexual Activity   Alcohol use: Yes    Comment: Social/Rare   Drug use: Never    Sexual activity: Yes    Birth control/protection: Surgical  Other Topics Concern   Not on file  Social History Narrative   Not on file   Social Determinants of Health   Financial Resource Strain: Medium Risk (01/28/2022)   Overall Financial Resource Strain (CARDIA)    Difficulty of Paying Living Expenses: Somewhat hard  Food Insecurity: No Food Insecurity (01/28/2022)   Hunger Vital Sign    Worried About Running Out of Food in the Last Year: Never true    Ran Out of Food in the Last Year: Never true  Transportation Needs: No Transportation Needs (01/28/2022)   PRAPARE - Administrator, Civil Service (Medical): No    Lack of Transportation (Non-Medical): No  Physical Activity: Sufficiently Active (01/28/2022)   Exercise Vital Sign    Days of Exercise per Week: 5 days    Minutes of Exercise per Session: 150+ min  Stress: Stress Concern Present (01/28/2022)   [employer] of Occupational Health - Occupational Stress Questionnaire    Feeling of Stress : Very much  Social Connections: Moderately Integrated (01/28/2022)   Social Connection and Isolation Panel [NHANES]    Frequency of Communication with Friends and Family: More than three times a week    Frequency of Social  Gatherings with Friends and Family: Never    Attends Religious Services: More than 4 times per year    Active Member of Clubs or Organizations: Yes    Attends Engineer, structural: More than 4 times per year    Marital Status: Separated  Intimate Partner Violence: Not on file    Past Surgical History:  Procedure Laterality Date   ANKLE FRACTURE SURGERY Left 03/08/2009   BREAST SURGERY Bilateral    Cyst Removal   ESOPHAGEAL MANOMETRY N/A 09/24/2022   Procedure: ESOPHAGEAL MANOMETRY (EM);  Surgeon: Shellia Cleverly, DO;  Location: [facility];  Service: Gastroenterology;  Laterality: N/A;   GASTRIC BYPASS  06/18/2015   LAPAROSCOPIC VAGINAL HYSTERECTOMY WITH SALPINGECTOMY Bilateral  05/15/2020   Procedure: LAPAROSCOPIC ASSISTED VAGINAL HYSTERECTOMY WITH SALPINGECTOMY;  Surgeon: Candice Camp, MD;  Location: [facility];  Service: Gynecology;  Laterality: Bilateral;  need bed   LASER ABLATION     Varicose veins   TONSILLECTOMY AND ADENOIDECTOMY  2001   TUBAL LIGATION  12/19/1999   UPPER GASTROINTESTINAL ENDOSCOPY     UPPER GI ENDOSCOPY     Uterine ablation  12/2018    Family History  Problem Relation Age of Onset   COPD Mother    Heart disease Mother    Alcohol abuse Mother    Depression Mother    Drug abuse Mother    High Cholesterol Mother    High blood pressure Mother    COPD Father    Aneurysm Father    Heart disease Father    Early death Father    Diabetes Father    Drug abuse Father    AAA (abdominal aortic aneurysm) Father        cause of death at 45   Arthritis Sister    Kidney disease Sister    Crohn's disease Sister    Ulcerative colitis Sister    Thyroid disease Sister    Kidney disease Brother    Kidney disease Maternal Aunt    Diabetes Paternal Aunt    Asthma Neg Hx    Allergic rhinitis Neg Hx    Colon cancer Neg Hx    Rectal cancer Neg Hx    Stomach cancer Neg Hx     Allergies  Allergen Reactions   Cyclobenzaprine Shortness Of Breath and Other (See Comments)    Relaxed tongue    Misc. Sulfonamide Containing Compounds Anaphylaxis   Shrimp Flavor Anaphylaxis   Sulfa Antibiotics Anaphylaxis, Hives and Swelling   Carisoprodol Hives   Cat Hair Extract Itching    Cat; Skin test + 12-13-2010   Cephalexin Other (See Comments)    Tongue burning   Metronidazole     not allergic; drug doesn't work for pt   Morphine Other (See Comments)    Feels like her head is going to "blow up"     Pollen Extract Itching    rhinitis TREES, grass; skin test + 12-13-10; sIgE + 03-2011   Topiramate     Brain fog   Adhesive [Tape] Rash    Other reaction(s): SKIN - rash (skin burns) PAPER TAPE IS OK    Tramadol Itching, Nausea And  Vomiting and Rash    Current Outpatient Medications on File Prior to Visit  Medication Sig Dispense Refill   acetaminophen (TYLENOL) 500 MG tablet Take by mouth.     acyclovir ointment (ZOVIRAX) 5 % Apply onto the affected area every 3 hours. 15 g 1   albuterol (VENTOLIN HFA) 108 (90  Base) MCG/ACT inhaler Inhale 1 - 2 puffs into the lungs every 6 hours as needed for wheezing or shortness of breath. 6.7 g 1   amitriptyline (ELAVIL) 10 MG tablet Take 1 tablet (10 mg total) by mouth at bedtime. 90 tablet 1   Azelastine-Fluticasone (DYMISTA) 137-50 MCG/ACT SUSP Place 2 sprays into both nostrils 2 (two) times daily as needed. 23 g 5   botulinum toxin Type A (BOTOX) 200 units injection Provider to inject 155 units into the muscles of the head and neck every 12 weeks. Discard remainder. 1 each 3   CALCIUM PO Take 1 tablet by mouth daily in the afternoon.     cetirizine (ZYRTEC) 10 MG tablet Take 2 tablets (20 mg total) by mouth 2 (two) times daily. 360 tablet 1   Cyanocobalamin (NASCOBAL NA) Place 1 spray into the nose every Sunday.     diazepam (VALIUM) 10 MG tablet Take 1 tablet (10 mg total) by mouth at bedtime as needed for sleep. 30 tablet 2   EPINEPHrine 0.3 mg/0.3 mL IJ SOAJ injection Inject 0.3 mg into the muscle as needed for anaphylaxis. 2 each 1   Estradiol Starter Pack 10 MCG INST Insert one softgel vaginally once a day for 2 weeks.  Then reduce to inserting one softgel twice a week as directed. 18 each 1   fluconazole (DIFLUCAN) 150 MG tablet Take 1 tablet (150 mg total) by mouth daily. 14 tablet 5   Fluticasone-Umeclidin-Vilant (TRELEGY ELLIPTA) 200-62.5-25 MCG/ACT AEPB Inhale 1 puff into the lungs daily. 60 each 5   Galcanezumab-gnlm (EMGALITY) 120 MG/ML SOAJ Inject 120 mg into the skin every 30 (thirty) days. 3 mL 3   Hypertonic Nasal Wash (SINUS RINSE REFILL) PACK Use one packet dissolved in water as needed. (Patient taking differently: Place 1 each into the nose in the morning and at  bedtime.) 100 each 5   ibuprofen (ADVIL) 800 MG tablet Take 1 tablet (800 mg total) by mouth 3 (three) times daily as needed (pain.). 270 tablet 0   ipratropium (ATROVENT) 0.06 % nasal spray Place 2 sprays into both nostrils 4 (four) times daily. 45 mL 5   linaclotide (LINZESS) 145 MCG CAPS capsule Take 1 capsule (145 mcg total) by mouth daily before breakfast. 60 capsule 3   linaclotide (LINZESS) 290 MCG CAPS capsule Take 1 capsule (290 mcg total) by mouth daily before breakfast. 90 capsule 1   meclizine (ANTIVERT) 25 MG tablet Take 25 mg by mouth 3 (three) times daily as needed (dizziness/vertigo).     meloxicam (MOBIC) 15 MG tablet Take 1 tablet (15 mg total) by mouth daily as needed for pain. 30 tablet 0   ondansetron (ZOFRAN-ODT) 4 MG disintegrating tablet Dissolve 1-2 tablets (4-8 mg total) by mouth every 8 (eight) hours as needed. May take with Rizatriptan 90 tablet 3   pantoprazole (PROTONIX) 40 MG tablet Take 1 tablet (40 mg total) by mouth at bedtime. 90 tablet 3   Pediatric Multiple Vit-C-FA (MULTIVITAMIN ANIMAL SHAPES, WITH CA/FA,) with C & FA chewable tablet Chew 2 tablets by mouth daily.     Rimegepant Sulfate (NURTEC) 75 MG TBDP Take 1 tablet (75 mg total) by mouth daily as needed. 16 tablet 11   rizatriptan (MAXALT-MLT) 10 MG disintegrating tablet Dissolve 1 tablet (10 mg total) by mouth as needed at onset of migraine. May take another tablet 2 hours later if needed, max 2 tablets in 24 hours. 27 tablet 3   simethicone (MYLICON) 80 MG chewable tablet Chew  80 mg by mouth every 6 (six) hours as needed for flatulence.     tezepelumab-ekko (TEZSPIRE) 210 MG/1. syringe Inject 1.91 mLs (210 mg total) into the skin every 28 (twenty-eight) days. 1.91 mL 11   valACYclovir (VALTREX) 500 MG tablet Take 2 tablets (1,000 mg total) by mouth 2 (two) times daily. 180 tablet 0   Vibegron (GEMTESA) 75 MG TABS Take 1 tablet (75 mg total) by mouth daily. 90 tablet 0   Current Facility-Administered  Medications on File Prior to Visit  Medication Dose Route Frequency Provider Last Rate Last Admin   tezepelumab-ekko (TEZSPIRE) 210 MG/1. syringe 210 mg  210 mg Subcutaneous Q28 days Alfonse Spruce, MD   210 mg at 06/18/23 1744    BP 120/80   Pulse 94   Temp 98.3 F (36.8 C) (Oral)   Ht 5\' 6"  (1.676 m)   Wt 175 lb (79.4 kg)   SpO2 95%   BMI 28.25 kg/m       Objective:   Physical Exam Vitals and nursing note reviewed.  Constitutional:      Appearance: Normal appearance.  HENT:     Mouth/Throat:     Pharynx: Oropharyngeal exudate and posterior oropharyngeal erythema present.     Tonsils: No tonsillar exudate or tonsillar abscesses.      Comments: Thrush noted on tongue and on pharynx.  Skin:    General: Skin is warm and dry.  Neurological:     General: No focal deficit present.     Mental Status: She is alert and oriented to person, place, and time.  Psychiatric:        Mood and Affect: Mood normal.        Behavior: Behavior normal.        Thought Content: Thought content normal.        Judgment: Judgment normal.        Assessment & Plan:  1. Thrush - nystatin (MYCOSTATIN) 100000 UNIT/ML suspension; Take 5 mLs (500,000 Units total) by mouth 4 (four) times daily. Gargle and spit.  Dispense: 473 mL; Refill: 0 - Follow up if not improving in the next week    Shirline Frees, NP

## 2023-07-09 NOTE — Telephone Encounter (Signed)
Please advise 

## 2023-07-10 ENCOUNTER — Other Ambulatory Visit: Payer: Self-pay

## 2023-07-14 ENCOUNTER — Encounter: Payer: Self-pay | Admitting: Adult Health

## 2023-07-14 ENCOUNTER — Ambulatory Visit (INDEPENDENT_AMBULATORY_CARE_PROVIDER_SITE_OTHER): Payer: 59

## 2023-07-14 DIAGNOSIS — J4550 Severe persistent asthma, uncomplicated: Secondary | ICD-10-CM | POA: Diagnosis not present

## 2023-07-15 ENCOUNTER — Other Ambulatory Visit (HOSPITAL_COMMUNITY): Payer: Self-pay

## 2023-07-15 NOTE — Telephone Encounter (Signed)
Noted  

## 2023-07-17 ENCOUNTER — Ambulatory Visit: Payer: 59

## 2023-07-20 ENCOUNTER — Telehealth: Payer: Self-pay | Admitting: Neurology

## 2023-07-20 NOTE — Telephone Encounter (Signed)
Initiated new Botox auth via cmm, status is pending. Key: [id number]

## 2023-07-23 ENCOUNTER — Encounter (INDEPENDENT_AMBULATORY_CARE_PROVIDER_SITE_OTHER): Payer: Self-pay

## 2023-07-30 ENCOUNTER — Other Ambulatory Visit (HOSPITAL_COMMUNITY): Payer: Self-pay

## 2023-08-03 ENCOUNTER — Other Ambulatory Visit (HOSPITAL_COMMUNITY): Payer: Self-pay

## 2023-08-04 ENCOUNTER — Other Ambulatory Visit (HOSPITAL_COMMUNITY): Payer: Self-pay

## 2023-08-04 ENCOUNTER — Ambulatory Visit (INDEPENDENT_AMBULATORY_CARE_PROVIDER_SITE_OTHER): Payer: 59 | Admitting: Neurology

## 2023-08-04 DIAGNOSIS — G43919 Migraine, unspecified, intractable, without status migrainosus: Secondary | ICD-10-CM

## 2023-08-04 DIAGNOSIS — G43709 Chronic migraine without aura, not intractable, without status migrainosus: Secondary | ICD-10-CM

## 2023-08-04 MED ORDER — MELOXICAM 15 MG PO TABS
15.0000 mg | ORAL_TABLET | Freq: Every evening | ORAL | 3 refills | Status: DC | PRN
  Filled 2023-08-04: qty 90, 90d supply, fill #0
  Filled 2023-10-09 – 2023-11-12 (×2): qty 90, 90d supply, fill #1
  Filled 2024-06-17: qty 90, 90d supply, fill #2

## 2023-08-04 MED ORDER — TIZANIDINE HCL 6 MG PO CAPS
6.0000 mg | ORAL_CAPSULE | Freq: Every evening | ORAL | 3 refills | Status: AC
  Filled 2023-08-04: qty 90, 90d supply, fill #0
  Filled 2023-11-12: qty 90, 90d supply, fill #1

## 2023-08-04 MED ORDER — ONABOTULINUMTOXINA 200 UNITS IJ SOLR
155.0000 [IU] | Freq: Once | INTRAMUSCULAR | Status: AC
Start: 2023-08-04 — End: 2023-08-04
  Administered 2023-08-04: 155 [IU] via INTRAMUSCULAR

## 2023-08-04 NOTE — Progress Notes (Signed)
Botox 200 units x 1 vial  LOT #: [id number] EXP: 05/2025 NDC: [id number]  SP  Bacteriostatic 0.9% Sodium Chloride  LOT #: [id number] EXP: 03/08/2024 NDC: 2952-8413-24  Witnessed by Toma Copier

## 2023-08-04 NOTE — Progress Notes (Signed)
Consent Form Botulism Toxin Injection For Chronic Migraine   08/04/2023: >50 % relief migraine and headache  freq and severity Tught muscles will give tizanidine Discussed meloxicam, don;t use with ibuprofen in the same class, watch kidney function Also watch for medication overuse taking too much ibuprofen or tylenol or triptans  Meds ordered this encounter  Medications   botulinum toxin Type A (BOTOX) injection 155 Units    Botox 200 units x 1 vial  LOT #: [id number] EXP: 05/2025 NDC: [id number]  SP  Bacteriostatic 0.9% Sodium Chloride  LOT #: [id number] EXP: 03/08/2024 NDC: 2130-8657-84   tizanidine (ZANAFLEX) 6 MG capsule    Sig: Take 1 capsule (6 mg total) by mouth at bedtime.    Dispense:  90 capsule    Refill:  3   meloxicam (MOBIC) 15 MG tablet    Sig: Take 1 tablet (15 mg total) by mouth at bedtime as needed for pain.    Dispense:  90 tablet    Refill:  3     Reviewed orally with patient, additionally signature is on file:  Botulism toxin has been approved by the Federal drug administration for treatment of chronic migraine. Botulism toxin does not cure chronic migraine and it may not be effective in some patients.  The administration of botulism toxin is accomplished by injecting a small amount of toxin into the muscles of the neck and head. Dosage must be titrated for each individual. Any benefits resulting from botulism toxin tend to wear off after 3 months with a repeat injection required if benefit is to be maintained. Injections are usually done every 3-4 months with maximum effect peak achieved by about 2 or 3 weeks. Botulism toxin is expensive and you should be sure of what costs you will incur resulting from the injection.  The side effects of botulism toxin use for chronic migraine may include:   -Transient, and usually mild, facial weakness with facial injections  -Transient, and usually mild, head or neck weakness with head/neck injections  -Reduction or  loss of forehead facial animation due to forehead muscle weakness  -Eyelid drooping  -Dry eye  -Pain at the site of injection or bruising at the site of injection  -Double vision  -Potential unknown long term risks  Contraindications: You should not have Botox if you are pregnant, nursing, allergic to albumin, have an infection, skin condition, or muscle weakness at the site of the injection, or have myasthenia gravis, Lambert-Eaton syndrome, or ALS.  It is also possible that as with any injection, there may be an allergic reaction or no effect from the medication. Reduced effectiveness after repeated injections is sometimes seen and rarely infection at the injection site may occur. All care will be taken to prevent these side effects. If therapy is given over a long time, atrophy and wasting in the muscle injected may occur. Occasionally the patient's become refractory to treatment because they develop antibodies to the toxin. In this event, therapy needs to be modified.  I have read the above information and consent to the administration of botulism toxin.    BOTOX PROCEDURE NOTE FOR MIGRAINE HEADACHE    Contraindications and precautions discussed with patient(above). Aseptic procedure was observed and patient tolerated procedure. Procedure performed by Dr. Artemio Aly  The condition has existed for more than 6 months, and pt does not have a diagnosis of ALS, Myasthenia Gravis or Lambert-Eaton Syndrome.  Risks and benefits of injections discussed and pt agrees to proceed with the procedure.  Written consent obtained  These injections are medically necessary. Pt  receives good benefits from these injections. These injections do not cause sedations or hallucinations which the oral therapies may cause.  Description of procedure:  The patient was placed in a sitting position. The standard protocol was used for Botox as follows, with 5 units of Botox injected at each site:   -Procerus  muscle, midline injection  -Corrugator muscle, bilateral injection  -Frontalis muscle, bilateral injection, with 2 sites each side, medial injection was performed in the upper one third of the frontalis muscle, in the region vertical from the medial inferior edge of the superior orbital rim. The lateral injection was again in the upper one third of the forehead vertically above the lateral limbus of the cornea, 1.5 cm lateral to the medial injection site.  -Temporalis muscle injection, 4 sites, bilaterally. The first injection was 3 cm above the tragus of the ear, second injection site was 1.5 cm to 3 cm up from the first injection site in line with the tragus of the ear. The third injection site was 1.5-3 cm forward between the first 2 injection sites. The fourth injection site was 1.5 cm posterior to the second injection site.   -Occipitalis muscle injection, 3 sites, bilaterally. The first injection was done one half way between the occipital protuberance and the tip of the mastoid process behind the ear. The second injection site was done lateral and superior to the first, 1 fingerbreadth from the first injection. The third injection site was 1 fingerbreadth superiorly and medially from the first injection site.  -Cervical paraspinal muscle injection, 2 sites, bilateral knee first injection site was 1 cm from the midline of the cervical spine, 3 cm inferior to the lower border of the occipital protuberance. The second injection site was 1.5 cm superiorly and laterally to the first injection site.  -Trapezius muscle injection was performed at 3 sites, bilaterally. The first injection site was in the upper trapezius muscle halfway between the inflection point of the neck, and the acromion. The second injection site was one half way between the acromion and the first injection site. The third injection was done between the first injection site and the inflection point of the neck.   Will return for  repeat injection in 3 months.   200 units of Botox was used, any Botox not injected was wasted. The patient tolerated the procedure well, there were no complications of the above procedure.

## 2023-08-05 ENCOUNTER — Other Ambulatory Visit (HOSPITAL_COMMUNITY): Payer: Self-pay

## 2023-08-06 NOTE — Progress Notes (Signed)
Donna Park D.[name] [address] [place] [address] Phone: [phone]   Assessment and Plan:     1. Chronic bilateral low back pain without sciatica 2. Neck pain 3. Somatic dysfunction of cervical region 4. Somatic dysfunction of lumbar region 5. Somatic dysfunction of thoracic region 6. Somatic dysfunction of pelvic region 7. Somatic dysfunction of rib region  -Chronic with exacerbation, subsequent sports medicine visit - Recurrence of multiple musculoskeletal complaints most prominent being neck and lower back - Patient has received relief with OMT in the past.  Elects for repeat OMT today.  Tolerated well per note below. - Decision today to treat with OMT was based on Physical Exam   After verbal consent patient was treated with HVLA (high velocity low amplitude), ME (muscle energy), FPR (flex positional release), ST (soft tissue), PC/PD (Pelvic Compression/ Pelvic Decompression) techniques in cervical, rib, thoracic, lumbar, and pelvic areas. Patient tolerated the procedure well with improvement in symptoms.  Patient educated on potential side effects of soreness and recommended to rest, hydrate, and use Tylenol as needed for pain control.   Pertinent previous records reviewed include none   Follow Up: 4 weeks for reevaluation.  Could consider repeat OMT   Subjective:   I, Donna Park, am serving as a Neurosurgeon for Doctor [employer]  Chief Complaint: neck pain   HPI:   08/07/2023 Patient states her low back and neck are flared and here for adjustment   Relevant Historical Information: Migraines, GERD, history of Roux-en-Y gastric bypass  Additional pertinent review of systems negative.  Current Outpatient Medications  Medication Sig Dispense Refill   acetaminophen (TYLENOL) 500 MG tablet Take by mouth.     acyclovir ointment (ZOVIRAX) 5 % Apply onto the affected area every 3 hours. 15 g 1   albuterol (VENTOLIN HFA) 108 (90  Base) MCG/ACT inhaler Inhale 1 - 2 puffs into the lungs every 6 hours as needed for wheezing or shortness of breath. 6.7 g 1   amitriptyline (ELAVIL) 10 MG tablet Take 1 tablet (10 mg total) by mouth at bedtime. 90 tablet 1   Azelastine-Fluticasone (DYMISTA) 137-50 MCG/ACT SUSP Place 2 sprays into both nostrils 2 (two) times daily as needed. 23 g 5   botulinum toxin Type A (BOTOX) 200 units injection Provider to inject 155 units into the muscles of the head and neck every 12 weeks. Discard remainder. 1 each 3   CALCIUM PO Take 1 tablet by mouth daily in the afternoon.     cetirizine (ZYRTEC) 10 MG tablet Take 2 tablets (20 mg total) by mouth 2 (two) times daily. 360 tablet 1   Cyanocobalamin (NASCOBAL NA) Place 1 spray into the nose every Sunday.     diazepam (VALIUM) 10 MG tablet Take 1 tablet (10 mg total) by mouth at bedtime as needed for sleep. 30 tablet 2   EPINEPHrine 0.3 mg/0.3 mL IJ SOAJ injection Inject 0.3 mg into the muscle as needed for anaphylaxis. 2 each 1   Estradiol Starter Pack 10 MCG INST Insert one softgel vaginally once a day for 2 weeks.  Then reduce to inserting one softgel twice a week as directed. 18 each 1   fluconazole (DIFLUCAN) 150 MG tablet Take 1 tablet (150 mg total) by mouth daily. 14 tablet 5   Fluticasone-Umeclidin-Vilant (TRELEGY ELLIPTA) 200-62.5-25 MCG/ACT AEPB Inhale 1 puff into the lungs daily. 60 each 5   Galcanezumab-gnlm (EMGALITY) 120 MG/ML SOAJ Inject 120 mg into the skin every 30 (  thirty) days. 3 mL 3   Hypertonic Nasal Wash (SINUS RINSE REFILL) PACK Use one packet dissolved in water as needed. (Patient taking differently: Place 1 each into the nose in the morning and at bedtime.) 100 each 5   ibuprofen (ADVIL) 800 MG tablet Take 1 tablet (800 mg total) by mouth 3 (three) times daily as needed (pain.). 270 tablet 0   ipratropium (ATROVENT) 0.06 % nasal spray Place 2 sprays into both nostrils 4 (four) times daily. 45 mL 5   linaclotide (LINZESS) 145 MCG  CAPS capsule Take 1 capsule (145 mcg total) by mouth daily before breakfast. 60 capsule 3   linaclotide (LINZESS) 290 MCG CAPS capsule Take 1 capsule (290 mcg total) by mouth daily before breakfast. 90 capsule 1   meclizine (ANTIVERT) 25 MG tablet Take 25 mg by mouth 3 (three) times daily as needed (dizziness/vertigo).     meloxicam (MOBIC) 15 MG tablet Take 1 tablet (15 mg total) by mouth at bedtime as needed for pain. 90 tablet 3   nystatin (MYCOSTATIN) 100000 UNIT/ML suspension Take 5 mLs (500,000 Units total) by mouth 4 (four) times daily. Gargle and spit. 473 mL 0   ondansetron (ZOFRAN-ODT) 4 MG disintegrating tablet Dissolve 1-2 tablets (4-8 mg total) by mouth every 8 (eight) hours as needed. May take with Rizatriptan 90 tablet 3   pantoprazole (PROTONIX) 40 MG tablet Take 1 tablet (40 mg total) by mouth at bedtime. 90 tablet 3   Pediatric Multiple Vit-C-FA (MULTIVITAMIN ANIMAL SHAPES, WITH CA/FA,) with C & FA chewable tablet Chew 2 tablets by mouth daily.     Rimegepant Sulfate (NURTEC) 75 MG TBDP Take 1 tablet (75 mg total) by mouth daily as needed. 16 tablet 11   rizatriptan (MAXALT-MLT) 10 MG disintegrating tablet Dissolve 1 tablet (10 mg total) by mouth as needed at onset of migraine. May take another tablet 2 hours later if needed, max 2 tablets in 24 hours. 27 tablet 3   simethicone (MYLICON) 80 MG chewable tablet Chew 80 mg by mouth every 6 (six) hours as needed for flatulence.     tezepelumab-ekko (TEZSPIRE) 210 MG/1. syringe Inject 1.91 mLs (210 mg total) into the skin every 28 (twenty-eight) days. 1.91 mL 11   tizanidine (ZANAFLEX) 6 MG capsule Take 1 capsule (6 mg total) by mouth at bedtime. 90 capsule 3   valACYclovir (VALTREX) 500 MG tablet Take 2 tablets (1,000 mg total) by mouth 2 (two) times daily. 180 tablet 0   Vibegron (GEMTESA) 75 MG TABS Take 1 tablet (75 mg total) by mouth daily. 90 tablet 0   Current Facility-Administered Medications  Medication Dose Route Frequency  Provider Last Rate Last Admin   tezepelumab-ekko (TEZSPIRE) 210 MG/1. syringe 210 mg  210 mg Subcutaneous Q28 days Alfonse Spruce, MD   210 mg at 07/14/23 1730      Objective:     Vitals:   08/07/23 1021  BP: 120/78  Pulse: 91  SpO2: 97%  Weight: 175 lb (79.4 kg)  Height: 5\' 6"  (1.676 m)      Body mass index is 28.25 kg/m.    Physical Exam:     General: Well-appearing, cooperative, sitting comfortably in no acute distress.   OMT Physical Exam:  ASIS Compression Test: Positive Right Cervical: TTP paraspinal, C3 RLSR Rib: Bilateral elevated first rib with TTP Thoracic: TTP paraspinal, T5-7 RLSR Lumbar: TTP paraspinal, L2 RLSL Pelvis: Right anterior innominate  Electronically signed by:  Donna Park D.[name] 11:14  AM 08/07/23

## 2023-08-07 ENCOUNTER — Encounter: Payer: Self-pay | Admitting: Sports Medicine

## 2023-08-07 ENCOUNTER — Ambulatory Visit (INDEPENDENT_AMBULATORY_CARE_PROVIDER_SITE_OTHER): Payer: 59 | Admitting: Sports Medicine

## 2023-08-07 ENCOUNTER — Ambulatory Visit: Payer: 59 | Admitting: Adult Health

## 2023-08-07 VITALS — BP 120/78 | HR 91 | Ht 66.0 in | Wt 175.0 lb

## 2023-08-07 DIAGNOSIS — M9901 Segmental and somatic dysfunction of cervical region: Secondary | ICD-10-CM

## 2023-08-07 DIAGNOSIS — M9908 Segmental and somatic dysfunction of rib cage: Secondary | ICD-10-CM

## 2023-08-07 DIAGNOSIS — M542 Cervicalgia: Secondary | ICD-10-CM

## 2023-08-07 DIAGNOSIS — M9903 Segmental and somatic dysfunction of lumbar region: Secondary | ICD-10-CM

## 2023-08-07 DIAGNOSIS — M9905 Segmental and somatic dysfunction of pelvic region: Secondary | ICD-10-CM

## 2023-08-07 DIAGNOSIS — M545 Low back pain, unspecified: Secondary | ICD-10-CM | POA: Diagnosis not present

## 2023-08-07 DIAGNOSIS — M9902 Segmental and somatic dysfunction of thoracic region: Secondary | ICD-10-CM

## 2023-08-07 DIAGNOSIS — G8929 Other chronic pain: Secondary | ICD-10-CM

## 2023-08-07 NOTE — Patient Instructions (Signed)
4 week follow up [facility]

## 2023-08-11 ENCOUNTER — Encounter: Payer: Self-pay | Admitting: Adult Health

## 2023-08-13 ENCOUNTER — Ambulatory Visit (INDEPENDENT_AMBULATORY_CARE_PROVIDER_SITE_OTHER): Payer: 59

## 2023-08-13 DIAGNOSIS — J4550 Severe persistent asthma, uncomplicated: Secondary | ICD-10-CM | POA: Diagnosis not present

## 2023-08-14 ENCOUNTER — Other Ambulatory Visit: Payer: Self-pay

## 2023-08-14 ENCOUNTER — Encounter: Payer: Self-pay | Admitting: Family Medicine

## 2023-08-14 ENCOUNTER — Ambulatory Visit (INDEPENDENT_AMBULATORY_CARE_PROVIDER_SITE_OTHER): Payer: 59

## 2023-08-14 ENCOUNTER — Ambulatory Visit (INDEPENDENT_AMBULATORY_CARE_PROVIDER_SITE_OTHER): Payer: 59 | Admitting: Family Medicine

## 2023-08-14 VITALS — BP 118/66 | HR 104 | Temp 98.6°F | Ht 66.0 in | Wt 178.6 lb

## 2023-08-14 DIAGNOSIS — Z23 Encounter for immunization: Secondary | ICD-10-CM

## 2023-08-14 DIAGNOSIS — R300 Dysuria: Secondary | ICD-10-CM

## 2023-08-14 LAB — POCT URINALYSIS DIPSTICK
Blood, UA: NEGATIVE
Glucose, UA: NEGATIVE
Ketones, UA: NEGATIVE
Leukocytes, UA: NEGATIVE
Nitrite, UA: NEGATIVE
Protein, UA: POSITIVE — AB
Spec Grav, UA: 1.025 (ref 1.010–1.025)
Urobilinogen, UA: 0.2 U/dL — AB
pH, UA: 5.5 (ref 5.0–8.0)

## 2023-08-14 MED ORDER — PHENAZOPYRIDINE HCL 100 MG PO TABS
100.0000 mg | ORAL_TABLET | Freq: Three times a day (TID) | ORAL | 0 refills | Status: DC | PRN
Start: 2023-08-14 — End: 2024-04-28
  Filled 2023-08-14: qty 10, 4d supply, fill #0

## 2023-08-14 NOTE — Assessment & Plan Note (Signed)
Although her symptoms suggest a UTI, her urine dipstick does not show indicators for this. I recommend she push fluids. I will prescribe some Pyridium for discomfort. I will send the urine for a formal urinalysis and a urine culture. I will hold on prescribing antibiotics for now.

## 2023-08-14 NOTE — Progress Notes (Signed)
[facility] [address] [place] [address] Dept: [phone] Dept Fax: [phone]  Office Visit  Subjective:    Patient ID: Donna Park, female    DOB: 06/26/1971, 52 y.o..   MRN: [id number]  Chief Complaint  Patient presents with   Dysuria    C/o having urine frequency, burning x 1 week.  No OTC meds.    History of Present Illness:  Patient is in today with a 1-week history of dysuria. She has had urinary frequency and burning. she has had UTIs in the past.   Past Medical History: Patient Active Problem List   Diagnosis Date Noted   Dysphagia    LUQ pain 03/01/2022   Change in bowel habits 03/01/2022   Loss of weight 03/01/2022   Chronic migraine without aura without status migrainosus, not intractable 06/14/2021   S/P laparoscopic assisted vaginal hysterectomy (LAVH) 05/15/2020   Migraines    GERD (gastroesophageal reflux disease)    Asthma    Seasonal and perennial allergic rhinitis 02/10/2019   Moderate persistent asthma, uncomplicated 02/10/2019   Anaphylactic shock due to adverse food reaction 02/10/2019   Atopic dermatitis 02/10/2019   History of Roux-en-Y gastric bypass 2016   Past Surgical History:  Procedure Laterality Date   ANKLE FRACTURE SURGERY Left 03/08/2009   BREAST SURGERY Bilateral    Cyst Removal   ESOPHAGEAL MANOMETRY N/A 09/24/2022   Procedure: ESOPHAGEAL MANOMETRY (EM);  Surgeon: Shellia Cleverly, DO;  Location: [facility];  Service: Gastroenterology;  Laterality: N/A;   GASTRIC BYPASS  06/18/2015   LAPAROSCOPIC VAGINAL HYSTERECTOMY WITH SALPINGECTOMY Bilateral 05/15/2020   Procedure: LAPAROSCOPIC ASSISTED VAGINAL HYSTERECTOMY WITH SALPINGECTOMY;  Surgeon: Candice Camp, MD;  Location: [facility];  Service: Gynecology;  Laterality: Bilateral;  need bed   LASER ABLATION     Varicose veins   TONSILLECTOMY AND ADENOIDECTOMY  2001   TUBAL LIGATION  12/19/1999    UPPER GASTROINTESTINAL ENDOSCOPY     UPPER GI ENDOSCOPY     Uterine ablation  12/2018   Family History  Problem Relation Age of Onset   COPD Mother    Heart disease Mother    Alcohol abuse Mother    Depression Mother    Drug abuse Mother    High Cholesterol Mother    High blood pressure Mother    COPD Father    Aneurysm Father    Heart disease Father    Early death Father    Diabetes Father    Drug abuse Father    AAA (abdominal aortic aneurysm) Father        cause of death at 20   Arthritis Sister    Kidney disease Sister    Crohn's disease Sister    Ulcerative colitis Sister    Thyroid disease Sister    Kidney disease Brother    Kidney disease Maternal Aunt    Diabetes Paternal Aunt    Asthma Neg Hx    Allergic rhinitis Neg Hx    Colon cancer Neg Hx    Rectal cancer Neg Hx    Stomach cancer Neg Hx    Outpatient Medications Prior to Visit  Medication Sig Dispense Refill   acetaminophen (TYLENOL) 500 MG tablet Take by mouth.     acyclovir ointment (ZOVIRAX) 5 % Apply onto the affected area every 3 hours. 15 g 1   albuterol (VENTOLIN HFA) 108 (90 Base) MCG/ACT inhaler Inhale 1 - 2 puffs into the lungs every 6  hours as needed for wheezing or shortness of breath. 6.7 g 1   amitriptyline (ELAVIL) 10 MG tablet Take 1 tablet (10 mg total) by mouth at bedtime. 90 tablet 1   Azelastine-Fluticasone (DYMISTA) 137-50 MCG/ACT SUSP Place 2 sprays into both nostrils 2 (two) times daily as needed. 23 g 5   botulinum toxin Type A (BOTOX) 200 units injection Provider to inject 155 units into the muscles of the head and neck every 12 weeks. Discard remainder. 1 each 3   CALCIUM PO Take 1 tablet by mouth daily in the afternoon.     cetirizine (ZYRTEC) 10 MG tablet Take 2 tablets (20 mg total) by mouth 2 (two) times daily. 360 tablet 1   Cyanocobalamin (NASCOBAL NA) Place 1 spray into the nose every Sunday.     diazepam (VALIUM) 10 MG tablet Take 1 tablet (10 mg total) by mouth at bedtime  as needed for sleep. 30 tablet 2   EPINEPHrine 0.3 mg/0.3 mL IJ SOAJ injection Inject 0.3 mg into the muscle as needed for anaphylaxis. 2 each 1   Estradiol Starter Pack 10 MCG INST Insert one softgel vaginally once a day for 2 weeks.  Then reduce to inserting one softgel twice a week as directed. 18 each 1   fluconazole (DIFLUCAN) 150 MG tablet Take 1 tablet (150 mg total) by mouth daily. 14 tablet 5   Fluticasone-Umeclidin-Vilant (TRELEGY ELLIPTA) 200-62.5-25 MCG/ACT AEPB Inhale 1 puff into the lungs daily. 60 each 5   Galcanezumab-gnlm (EMGALITY) 120 MG/ML SOAJ Inject 120 mg into the skin every 30 (thirty) days. 3 mL 3   Hypertonic Nasal Wash (SINUS RINSE REFILL) PACK Use one packet dissolved in water as needed. (Patient taking differently: Place 1 each into the nose in the morning and at bedtime.) 100 each 5   ibuprofen (ADVIL) 800 MG tablet Take 1 tablet (800 mg total) by mouth 3 (three) times daily as needed (pain.). 270 tablet 0   ipratropium (ATROVENT) 0.06 % nasal spray Place 2 sprays into both nostrils 4 (four) times daily. 45 mL 5   linaclotide (LINZESS) 145 MCG CAPS capsule Take 1 capsule (145 mcg total) by mouth daily before breakfast. 60 capsule 3   linaclotide (LINZESS) 290 MCG CAPS capsule Take 1 capsule (290 mcg total) by mouth daily before breakfast. 90 capsule 1   meclizine (ANTIVERT) 25 MG tablet Take 25 mg by mouth 3 (three) times daily as needed (dizziness/vertigo).     meloxicam (MOBIC) 15 MG tablet Take 1 tablet (15 mg total) by mouth at bedtime as needed for pain. 90 tablet 3   nystatin (MYCOSTATIN) 100000 UNIT/ML suspension Take 5 mLs (500,000 Units total) by mouth 4 (four) times daily. Gargle and spit. 473 mL 0   ondansetron (ZOFRAN-ODT) 4 MG disintegrating tablet Dissolve 1-2 tablets (4-8 mg total) by mouth every 8 (eight) hours as needed. May take with Rizatriptan 90 tablet 3   pantoprazole (PROTONIX) 40 MG tablet Take 1 tablet (40 mg total) by mouth at bedtime. 90 tablet  3   Pediatric Multiple Vit-C-FA (MULTIVITAMIN ANIMAL SHAPES, WITH CA/FA,) with C & FA chewable tablet Chew 2 tablets by mouth daily.     Rimegepant Sulfate (NURTEC) 75 MG TBDP Take 1 tablet (75 mg total) by mouth daily as needed. 16 tablet 11   rizatriptan (MAXALT-MLT) 10 MG disintegrating tablet Dissolve 1 tablet (10 mg total) by mouth as needed at onset of migraine. May take another tablet 2 hours later if needed, max 2 tablets in  24 hours. 27 tablet 3   simethicone (MYLICON) 80 MG chewable tablet Chew 80 mg by mouth every 6 (six) hours as needed for flatulence.     tezepelumab-ekko (TEZSPIRE) 210 MG/1. syringe Inject 1.91 mLs (210 mg total) into the skin every 28 (twenty-eight) days. 1.91 mL 11   tizanidine (ZANAFLEX) 6 MG capsule Take 1 capsule (6 mg total) by mouth at bedtime. 90 capsule 3   valACYclovir (VALTREX) 500 MG tablet Take 2 tablets (1,000 mg total) by mouth 2 (two) times daily. 180 tablet 0   Vibegron (GEMTESA) 75 MG TABS Take 1 tablet (75 mg total) by mouth daily. 90 tablet 0   Facility-Administered Medications Prior to Visit  Medication Dose Route Frequency Provider Last Rate Last Admin   tezepelumab-ekko (TEZSPIRE) 210 MG/1. syringe 210 mg  210 mg Subcutaneous Q28 days Alfonse Spruce, MD   210 mg at 08/13/23 1810   Allergies  Allergen Reactions   Cyclobenzaprine Shortness Of Breath and Other (See Comments)    Relaxed tongue    Misc. Sulfonamide Containing Compounds Anaphylaxis   Shrimp Flavor Anaphylaxis   Sulfa Antibiotics Anaphylaxis, Hives and Swelling   Carisoprodol Hives   Cat Hair Extract Itching    Cat; Skin test + 12-13-2010   Cephalexin Other (See Comments)    Tongue burning   Metronidazole     not allergic; drug doesn't work for pt   Morphine Other (See Comments)    Feels like her head is going to "blow up"     Pollen Extract Itching    rhinitis TREES, grass; skin test + 12-13-10; sIgE + 03-2011   Topiramate     Brain fog   Adhesive [Tape]  Rash    Other reaction(s): SKIN - rash (skin burns) PAPER TAPE IS OK    Tramadol Itching, Nausea And Vomiting and Rash     Objective:   Today's Vitals   08/14/23 1359  BP: 118/66  Pulse: (!) 104  Temp: 98.6 F (37 C)  TempSrc: Temporal  SpO2: 98%  Weight: 178 lb 9.6 oz (81 kg)  Height: 5\' 6"  (1.676 m)   Body mass index is 28.83 kg/m.   General: Well developed, well nourished. No acute distress. Psych: Alert and oriented. Normal mood and affect.  Health Maintenance Due  Topic Date Due   PAP SMEAR-Modifier  11/07/2022   MAMMOGRAM  Never done   Lab Results Component Ref Range & Units 14:32  Color, UA dark yellow  Clarity, UA clear  Glucose, UA Negative Negative  Bilirubin, UA 1+  Ketones, UA neg  Spec Grav, UA 1.010 - 1.025 1.025  Blood, UA neg  pH, UA 5.0 - 8.0 5.5  Protein, UA Negative Positive Abnormal   Comment: 15 mg/dl  Urobilinogen, UA 0.2 or 1.0 E.U./dL 0.2 Abnormal   Nitrite, UA neg  Leukocytes, UA Negative Negative   Assessment & Plan:   Problem List Items Addressed This Visit   None Visit Diagnoses     Dysuria    -  Primary   Relevant Medications   phenazopyridine (PYRIDIUM) 100 MG tablet   Other Relevant Orders   POCT Urinalysis Dipstick   Urinalysis, Routine w reflex microscopic   Urine Culture       Return if symptoms worsen or fail to improve.   Loyola Mast, MD

## 2023-08-15 ENCOUNTER — Other Ambulatory Visit (HOSPITAL_COMMUNITY): Payer: Self-pay

## 2023-08-17 ENCOUNTER — Encounter: Payer: Self-pay | Admitting: Adult Health

## 2023-08-19 ENCOUNTER — Telehealth: Payer: Self-pay

## 2023-08-19 NOTE — Telephone Encounter (Addendum)
LVMTRC. Pt needs a lab appt to leave urine sample or she can go to her PCP office to leave urine same. Orders are placed in "future."  Pt was in the clinic on Friday 08/14/23 for acute visit with Dr. Veto Kemps. UA and Urine culture were ordered. However restricted access was on patient's chart, disabling the ability to view or release orders. Pt left urine specimen on Friday, but urine is only viable for 24 hours for tests to be ran, yielding the need for pt to return to leave urine sample.

## 2023-08-19 NOTE — Addendum Note (Signed)
Addended by: Larey Dresser on: 08/19/2023 09:47 AM   Modules accepted: Orders

## 2023-08-21 ENCOUNTER — Ambulatory Visit (HOSPITAL_COMMUNITY)
Admission: RE | Admit: 2023-08-21 | Discharge: 2023-08-21 | Disposition: A | Discharge status: Home / Self Care | Payer: 59 | Source: Ambulatory Visit | Attending: Cardiovascular Disease | Admitting: Cardiovascular Disease

## 2023-08-21 ENCOUNTER — Ambulatory Visit (HOSPITAL_BASED_OUTPATIENT_CLINIC_OR_DEPARTMENT_OTHER)
Admission: RE | Admit: 2023-08-21 | Discharge: 2023-08-21 | Disposition: A | Discharge status: Home / Self Care | Payer: 59 | Source: Ambulatory Visit | Attending: Cardiovascular Disease | Admitting: Cardiovascular Disease

## 2023-08-21 ENCOUNTER — Other Ambulatory Visit: Payer: Self-pay | Admitting: Cardiovascular Disease

## 2023-08-21 ENCOUNTER — Ambulatory Visit (HOSPITAL_BASED_OUTPATIENT_CLINIC_OR_DEPARTMENT_OTHER): Payer: 59

## 2023-08-21 ENCOUNTER — Other Ambulatory Visit (HOSPITAL_COMMUNITY): Payer: Self-pay

## 2023-08-21 DIAGNOSIS — R000 Tachycardia, unspecified: Secondary | ICD-10-CM

## 2023-08-21 DIAGNOSIS — R0989 Other specified symptoms and signs involving the circulatory and respiratory systems: Secondary | ICD-10-CM | POA: Diagnosis not present

## 2023-08-21 DIAGNOSIS — R109 Unspecified abdominal pain: Secondary | ICD-10-CM

## 2023-08-21 DIAGNOSIS — Z8249 Family history of ischemic heart disease and other diseases of the circulatory system: Secondary | ICD-10-CM

## 2023-08-25 ENCOUNTER — Encounter: Payer: Self-pay | Admitting: Cardiovascular Disease

## 2023-08-25 ENCOUNTER — Other Ambulatory Visit (HOSPITAL_COMMUNITY): Payer: Self-pay

## 2023-08-29 ENCOUNTER — Other Ambulatory Visit: Payer: Self-pay

## 2023-08-29 ENCOUNTER — Encounter (HOSPITAL_COMMUNITY): Payer: Self-pay

## 2023-09-01 ENCOUNTER — Other Ambulatory Visit: Payer: Self-pay

## 2023-09-01 ENCOUNTER — Other Ambulatory Visit (HOSPITAL_COMMUNITY): Payer: Self-pay

## 2023-09-01 NOTE — Progress Notes (Signed)
Specialty Pharmacy Refill Coordination Note  Donna Park is a 52 y.o. female contacted today regarding refills of specialty medication(s) Tezepelumab-Ekko .  Patient requested Delivery  on 09/03/23  to verified address [address], [facility] by [facility], Dr Gallagher's office   Medication will be filled on 09/02/23.

## 2023-09-02 ENCOUNTER — Other Ambulatory Visit: Payer: Self-pay

## 2023-09-02 ENCOUNTER — Other Ambulatory Visit (HOSPITAL_COMMUNITY): Payer: Self-pay

## 2023-09-03 NOTE — Progress Notes (Signed)
Donna Park D.[name] [address] [place] [address] Phone: [phone]   Assessment and Plan:     1. Chronic bilateral low back pain without sciatica 2. Neck pain 3. Somatic dysfunction of cervical region 4. Somatic dysfunction of thoracic region 5. Somatic dysfunction of lumbar region 6. Somatic dysfunction of pelvic region 7. Somatic dysfunction of rib region  -Chronic with exacerbation, subsequent sports medicine visit - Recurrence of multiple musculoskeletal complaints with most prominent being in neck and upper shoulders, low back, right shoulder - Start HEP for shoulder - Patient has received relief with OMT in the past.  Elects for repeat OMT today.  Tolerated well per note below. - Decision today to treat with OMT was based on Physical Exam  After verbal consent patient was treated with HVLA (high velocity low amplitude), ME (muscle energy), FPR (flex positional release), ST (soft tissue), PC/PD (Pelvic Compression/ Pelvic Decompression) techniques in cervical, rib, thoracic, lumbar, and pelvic areas. Patient tolerated the procedure well with improvement in symptoms.  Patient educated on potential side effects of soreness and recommended to rest, hydrate, and use Tylenol as needed for pain control.  15 additional minutes spent for educating Therapeutic Home Exercise Program.  This included exercises focusing on stretching, strengthening, with focus on eccentric aspects.   Long term goals include an improvement in range of motion, strength, endurance as well as avoiding reinjury. Patient's frequency would include in 1-2 times a day, 3-5 times a week for a duration of 6-12 weeks. Proper technique shown and discussed handout in great detail with ATC.  All questions were discussed and answered.    Pertinent previous records reviewed include none   Follow Up: 5 weeks for reevaluation.  Could consider repeat OMT versus trigger point CSI  based on patient's primary concern at office visit   Subjective:   I, Donna Park, am serving as a Neurosurgeon for [employer]  Chief Complaint: neck pain    HPI:    08/07/2023 Patient states her low back and neck are flared and here for adjustment   09/04/2023 Patient states that her upper trap is tight , right shoulder tightness and pain, low back flare of tightness she isnt able to stand up straight    Relevant Historical Information: Migraines, GERD, history of Roux-en-Y gastric bypass  Additional pertinent review of systems negative.   Current Outpatient Medications:    acetaminophen (TYLENOL) 500 MG tablet, Take by mouth., Disp: , Rfl:    acyclovir ointment (ZOVIRAX) 5 %, Apply onto the affected area every 3 hours., Disp: 15 g, Rfl: 1   albuterol (VENTOLIN HFA) 108 (90 Base) MCG/ACT inhaler, Inhale 1 - 2 puffs into the lungs every 6 hours as needed for wheezing or shortness of breath., Disp: 6.7 g, Rfl: 1   amitriptyline (ELAVIL) 10 MG tablet, Take 1 tablet (10 mg total) by mouth at bedtime., Disp: 90 tablet, Rfl: 1   Azelastine-Fluticasone (DYMISTA) 137-50 MCG/ACT SUSP, Place 2 sprays into both nostrils 2 (two) times daily as needed., Disp: 23 g, Rfl: 5   botulinum toxin Type A (BOTOX) 200 units injection, Provider to inject 155 units into the muscles of the head and neck every 12 weeks. Discard remainder., Disp: 1 each, Rfl: 3   CALCIUM PO, Take 1 tablet by mouth daily in the afternoon., Disp: , Rfl:    cetirizine (ZYRTEC) 10 MG tablet, Take 2 tablets (20 mg total) by mouth 2 (two) times daily., Disp: 360  tablet, Rfl: 1   Cyanocobalamin (NASCOBAL NA), Place 1 spray into the nose every Sunday., Disp: , Rfl:    diazepam (VALIUM) 10 MG tablet, Take 1 tablet (10 mg total) by mouth at bedtime as needed for sleep., Disp: 30 tablet, Rfl: 2   EPINEPHrine 0.3 mg/0.3 mL IJ SOAJ injection, Inject 0.3 mg into the muscle as needed for anaphylaxis., Disp: 2 each, Rfl: 1   Estradiol  Starter Pack 10 MCG INST, Insert one softgel vaginally once a day for 2 weeks.  Then reduce to inserting one softgel twice a week as directed., Disp: 18 each, Rfl: 1   fluconazole (DIFLUCAN) 150 MG tablet, Take 1 tablet (150 mg total) by mouth daily., Disp: 14 tablet, Rfl: 5   Fluticasone-Umeclidin-Vilant (TRELEGY ELLIPTA) 200-62.5-25 MCG/ACT AEPB, Inhale 1 puff into the lungs daily., Disp: 60 each, Rfl: 5   Galcanezumab-gnlm (EMGALITY) 120 MG/ML SOAJ, Inject 120 mg into the skin every 30 (thirty) days., Disp: 3 mL, Rfl: 3   Hypertonic Nasal Wash (SINUS RINSE REFILL) PACK, Use one packet dissolved in water as needed. (Patient taking differently: Place 1 each into the nose in the morning and at bedtime.), Disp: 100 each, Rfl: 5   ibuprofen (ADVIL) 800 MG tablet, Take 1 tablet (800 mg total) by mouth 3 (three) times daily as needed (pain.)., Disp: 270 tablet, Rfl: 0   ipratropium (ATROVENT) 0.06 % nasal spray, Place 2 sprays into both nostrils 4 (four) times daily., Disp: 45 mL, Rfl: 5   linaclotide (LINZESS) 145 MCG CAPS capsule, Take 1 capsule (145 mcg total) by mouth daily before breakfast., Disp: 60 capsule, Rfl: 3   linaclotide (LINZESS) 290 MCG CAPS capsule, Take 1 capsule (290 mcg total) by mouth daily before breakfast., Disp: 90 capsule, Rfl: 1   meclizine (ANTIVERT) 25 MG tablet, Take 25 mg by mouth 3 (three) times daily as needed (dizziness/vertigo)., Disp: , Rfl:    meloxicam (MOBIC) 15 MG tablet, Take 1 tablet (15 mg total) by mouth at bedtime as needed for pain., Disp: 90 tablet, Rfl: 3   nystatin (MYCOSTATIN) 100000 UNIT/ML suspension, Take 5 mLs (500,000 Units total) by mouth 4 (four) times daily. Gargle and spit., Disp: 473 mL, Rfl: 0   ondansetron (ZOFRAN-ODT) 4 MG disintegrating tablet, Dissolve 1-2 tablets (4-8 mg total) by mouth every 8 (eight) hours as needed. May take with Rizatriptan, Disp: 90 tablet, Rfl: 3   pantoprazole (PROTONIX) 40 MG tablet, Take 1 tablet (40 mg total) by  mouth at bedtime., Disp: 90 tablet, Rfl: 3   Pediatric Multiple Vit-C-FA (MULTIVITAMIN ANIMAL SHAPES, WITH CA/FA,) with C & FA chewable tablet, Chew 2 tablets by mouth daily., Disp: , Rfl:    phenazopyridine (PYRIDIUM) 100 MG tablet, Take 1 tablet (100 mg total) by mouth 3 (three) times daily as needed for pain., Disp: 10 tablet, Rfl: 0   Rimegepant Sulfate (NURTEC) 75 MG TBDP, Take 1 tablet (75 mg total) by mouth daily as needed., Disp: 16 tablet, Rfl: 11   rizatriptan (MAXALT-MLT) 10 MG disintegrating tablet, Dissolve 1 tablet (10 mg total) by mouth as needed at onset of migraine. May take another tablet 2 hours later if needed, max 2 tablets in 24 hours., Disp: 27 tablet, Rfl: 3   simethicone (MYLICON) 80 MG chewable tablet, Chew 80 mg by mouth every 6 (six) hours as needed for flatulence., Disp: , Rfl:    tezepelumab-ekko (TEZSPIRE) 210 MG/1. syringe, Inject 1.91 mLs (210 mg total) into the skin every 28 (twenty-eight) days., Disp:  1.91 mL, Rfl: 11   tizanidine (ZANAFLEX) 6 MG capsule, Take 1 capsule (6 mg total) by mouth at bedtime., Disp: 90 capsule, Rfl: 3   valACYclovir (VALTREX) 500 MG tablet, Take 2 tablets (1,000 mg total) by mouth 2 (two) times daily., Disp: 180 tablet, Rfl: 0   Vibegron (GEMTESA) 75 MG TABS, Take 1 tablet (75 mg total) by mouth daily., Disp: 90 tablet, Rfl: 0  Current Facility-Administered Medications:    tezepelumab-ekko (TEZSPIRE) 210 MG/1. syringe 210 mg, 210 mg, Subcutaneous, Q28 days, Alfonse Spruce, MD, 210 mg at 08/13/23 1810   Objective:     Vitals:   09/04/23 1009  BP: 110/82  Weight: 178 lb (80.7 kg)  Height: 5\' 6"  (1.676 m)      Body mass index is 28.73 kg/m.    Physical Exam:    General: Well-appearing, cooperative, sitting comfortably in no acute distress.   OMT Physical Exam:  ASIS Compression Test: Positive Right Cervical: TTP paraspinal, C4-6 RRSR Rib: Bilateral elevated first rib with TTP worse on right Thoracic: TTP  paraspinal, T4-6 RRSL, T7-9 RLSR Lumbar: TTP paraspinal, L1-3 RRSL Pelvis: Right anterior innominate    Electronically signed by:  Donna Park [name] 11:40 AM 09/04/23

## 2023-09-04 ENCOUNTER — Ambulatory Visit (INDEPENDENT_AMBULATORY_CARE_PROVIDER_SITE_OTHER): Payer: 59 | Admitting: Sports Medicine

## 2023-09-04 VITALS — BP 110/82 | Ht 66.0 in | Wt 178.0 lb

## 2023-09-04 DIAGNOSIS — M9901 Segmental and somatic dysfunction of cervical region: Secondary | ICD-10-CM | POA: Diagnosis not present

## 2023-09-04 DIAGNOSIS — G8929 Other chronic pain: Secondary | ICD-10-CM | POA: Diagnosis not present

## 2023-09-04 DIAGNOSIS — M545 Low back pain, unspecified: Secondary | ICD-10-CM

## 2023-09-04 DIAGNOSIS — M9902 Segmental and somatic dysfunction of thoracic region: Secondary | ICD-10-CM

## 2023-09-04 DIAGNOSIS — M542 Cervicalgia: Secondary | ICD-10-CM | POA: Diagnosis not present

## 2023-09-04 DIAGNOSIS — M9908 Segmental and somatic dysfunction of rib cage: Secondary | ICD-10-CM

## 2023-09-04 DIAGNOSIS — M9905 Segmental and somatic dysfunction of pelvic region: Secondary | ICD-10-CM

## 2023-09-04 DIAGNOSIS — M9903 Segmental and somatic dysfunction of lumbar region: Secondary | ICD-10-CM | POA: Diagnosis not present

## 2023-09-04 NOTE — Patient Instructions (Signed)
Shoulder HEP  5 week follow up

## 2023-09-11 ENCOUNTER — Other Ambulatory Visit (HOSPITAL_COMMUNITY): Payer: Self-pay

## 2023-09-11 ENCOUNTER — Ambulatory Visit: Payer: 59 | Admitting: *Deleted

## 2023-09-11 ENCOUNTER — Other Ambulatory Visit: Payer: Self-pay | Admitting: Adult Health

## 2023-09-11 DIAGNOSIS — J4550 Severe persistent asthma, uncomplicated: Secondary | ICD-10-CM | POA: Diagnosis not present

## 2023-09-11 DIAGNOSIS — Z124 Encounter for screening for malignant neoplasm of cervix: Secondary | ICD-10-CM | POA: Diagnosis not present

## 2023-09-11 DIAGNOSIS — Z113 Encounter for screening for infections with a predominantly sexual mode of transmission: Secondary | ICD-10-CM | POA: Diagnosis not present

## 2023-09-11 DIAGNOSIS — Z1231 Encounter for screening mammogram for malignant neoplasm of breast: Secondary | ICD-10-CM | POA: Diagnosis not present

## 2023-09-11 DIAGNOSIS — Z6828 Body mass index (BMI) 28.0-28.9, adult: Secondary | ICD-10-CM | POA: Diagnosis not present

## 2023-09-11 DIAGNOSIS — N760 Acute vaginitis: Secondary | ICD-10-CM | POA: Diagnosis not present

## 2023-09-11 DIAGNOSIS — Z01419 Encounter for gynecological examination (general) (routine) without abnormal findings: Secondary | ICD-10-CM | POA: Diagnosis not present

## 2023-09-11 NOTE — Telephone Encounter (Signed)
Ok for 90 days

## 2023-09-12 ENCOUNTER — Other Ambulatory Visit (HOSPITAL_COMMUNITY): Payer: Self-pay

## 2023-09-12 MED ORDER — GEMTESA 75 MG PO TABS
ORAL_TABLET | ORAL | 4 refills | Status: DC
  Filled 2023-09-12: qty 90, 90d supply, fill #0
  Filled 2023-10-09 – 2024-04-01 (×3): qty 90, 90d supply, fill #1
  Filled 2024-06-17 (×2): qty 90, 90d supply, fill #2

## 2023-09-12 MED ORDER — ESTRADIOL 0.75 MG/1.25 GM (0.06%) TD GEL
TRANSDERMAL | 4 refills | Status: DC
  Filled 2023-09-12: qty 37.5, 30d supply, fill #0

## 2023-09-14 ENCOUNTER — Other Ambulatory Visit (HOSPITAL_COMMUNITY): Payer: Self-pay

## 2023-09-14 MED ORDER — VALACYCLOVIR HCL 500 MG PO TABS
1000.0000 mg | ORAL_TABLET | Freq: Two times a day (BID) | ORAL | 0 refills | Status: DC
  Filled 2023-09-14: qty 180, 45d supply, fill #0

## 2023-09-16 ENCOUNTER — Other Ambulatory Visit (HOSPITAL_COMMUNITY): Payer: Self-pay

## 2023-09-16 ENCOUNTER — Other Ambulatory Visit: Payer: Self-pay | Admitting: Adult Health

## 2023-09-16 ENCOUNTER — Other Ambulatory Visit: Payer: Self-pay

## 2023-09-18 ENCOUNTER — Other Ambulatory Visit (HOSPITAL_COMMUNITY): Payer: Self-pay

## 2023-09-25 ENCOUNTER — Ambulatory Visit (HOSPITAL_BASED_OUTPATIENT_CLINIC_OR_DEPARTMENT_OTHER)
Admission: RE | Admit: 2023-09-25 | Discharge: 2023-09-25 | Disposition: A | Discharge status: Home / Self Care | Payer: 59 | Source: Ambulatory Visit | Attending: Cardiovascular Disease | Admitting: Cardiovascular Disease

## 2023-09-25 DIAGNOSIS — Z8249 Family history of ischemic heart disease and other diseases of the circulatory system: Secondary | ICD-10-CM | POA: Insufficient documentation

## 2023-09-25 NOTE — Progress Notes (Signed)
[facility] [address] [place] [address] Phone: [phone]   Assessment and Plan:     1. Chronic bilateral low back pain without sciatica 2. Neck pain 3. Strain of left trapezius muscle, subsequent encounter 4. Strain of right trapezius muscle, subsequent encounter  -Chronic with exacerbation, subsequent visit - Recurrent flare of multiple musculoskeletal regions, most prominent in neck, trapezius bilaterally - Patient has received relative relief with trigger point injections and OMT in the past.  It has been >3 months since last trigger point injections, so patient elects for repeat trigger point injections today.  Tolerated well per note below  Trigger Point Injection: After informed consent was obtained, skin cleaned with alcohol  prep.  A total of 10 trigger points identified along cervical paraspinal, bilateral trapezius, bilateral rhomboids.  Injections given over area of pain for total injection of 1 mL Kenalog 40 mg/ML and 7 ml lidocaine 1% w/o epi.  Patient had relief after the injection without side effects.  Pt given signs of infection to watch for.   Pertinent previous records reviewed include none   Follow Up: For reevaluation.  Could consider OMT   Subjective:   I, Donna Park, am serving as a Neurosurgeon for [employer]  Chief Complaint: neck pain    HPI:    08/07/2023 Patient states her low back and neck are flared and here for adjustment    09/04/2023 Patient states that her upper trap is tight , right shoulder tightness and pain, low back flare of tightness she isnt able to stand up straight   10/09/2023 Patient states that she is tight. Has been trying to do some stretches.    Relevant Historical Information: Migraines, GERD, history of Roux-en-Y gastric bypass  Additional pertinent review of systems negative.  Current Outpatient Medications  Medication Sig Dispense Refill   acetaminophen  (TYLENOL) 500 MG tablet Take by mouth.     acyclovir ointment (ZOVIRAX) 5 % Apply onto the affected area every 3 hours. 15 g 1   albuterol (VENTOLIN HFA) 108 (90 Base) MCG/ACT inhaler Inhale 1 - 2 puffs into the lungs every 6 hours as needed for wheezing or shortness of breath. 6.7 g 1   amitriptyline (ELAVIL) 10 MG tablet Take 1 tablet (10 mg total) by mouth at bedtime. 90 tablet 1   Azelastine-Fluticasone (DYMISTA) 137-50 MCG/ACT SUSP Place 2 sprays into both nostrils 2 (two) times daily as needed. 23 g 5   botulinum toxin Type A (BOTOX) 200 units injection Provider to inject 155 units into the muscles of the head and neck every 12 weeks. Discard remainder. 1 each 3   CALCIUM PO Take 1 tablet by mouth daily in the afternoon.     cetirizine (ZYRTEC) 10 MG tablet Take 2 tablets (20 mg total) by mouth 2 (two) times daily. 360 tablet 1   Cyanocobalamin (NASCOBAL NA) Place 1 spray into the nose every Sunday.     diazepam (VALIUM) 10 MG tablet Take 1 tablet (10 mg total) by mouth at bedtime as needed for sleep. 30 tablet 2   EPINEPHrine 0.3 mg/0.3 mL IJ SOAJ injection Inject 0.3 mg into the muscle as needed for anaphylaxis. 2 each 1   Estradiol (ESTROGEL) 0.75 MG/1.25 GM (0.06%) topical gel Apply 1 pump every day by topical route. 37.5 g 4   Estradiol Starter Pack 10 MCG INST Insert one softgel vaginally once a day for 2 weeks.  Then reduce to inserting one  softgel twice a week as directed. 18 each 1   fluconazole (DIFLUCAN) 150 MG tablet Take 1 tablet (150 mg total) by mouth daily. 14 tablet 5   Fluticasone-Umeclidin-Vilant (TRELEGY ELLIPTA) 200-62.5-25 MCG/ACT AEPB Inhale 1 puff into the lungs daily. 60 each 5   Galcanezumab-gnlm (EMGALITY) 120 MG/ML SOAJ Inject 120 mg into the skin every 30 (thirty) days. 3 mL 3   Hypertonic Nasal Wash (SINUS RINSE REFILL) PACK Use one packet dissolved in water as needed. (Patient taking differently: Place 1 each into the nose in the morning and at bedtime.) 100 each  5   ibuprofen (ADVIL) 800 MG tablet Take 1 tablet (800 mg total) by mouth 3 (three) times daily as needed (pain.). 270 tablet 0   ipratropium (ATROVENT) 0.06 % nasal spray Place 2 sprays into both nostrils 4 (four) times daily. 45 mL 5   linaclotide (LINZESS) 145 MCG CAPS capsule Take 1 capsule (145 mcg total) by mouth daily before breakfast. 60 capsule 3   linaclotide (LINZESS) 290 MCG CAPS capsule Take 1 capsule (290 mcg total) by mouth daily before breakfast. 90 capsule 1   meclizine (ANTIVERT) 25 MG tablet Take 25 mg by mouth 3 (three) times daily as needed (dizziness/vertigo).     meloxicam (MOBIC) 15 MG tablet Take 1 tablet (15 mg total) by mouth at bedtime as needed for pain. 90 tablet 3   nystatin (MYCOSTATIN) 100000 UNIT/ML suspension Take 5 mLs (500,000 Units total) by mouth 4 (four) times daily. Gargle and spit. 473 mL 0   ondansetron (ZOFRAN-ODT) 4 MG disintegrating tablet Dissolve 1-2 tablets (4-8 mg total) by mouth every 8 (eight) hours as needed. May take with Rizatriptan 90 tablet 3   pantoprazole (PROTONIX) 40 MG tablet Take 1 tablet (40 mg total) by mouth at bedtime. 90 tablet 3   Pediatric Multiple Vit-C-FA (MULTIVITAMIN ANIMAL SHAPES, WITH CA/FA,) with C & FA chewable tablet Chew 2 tablets by mouth daily.     phenazopyridine (PYRIDIUM) 100 MG tablet Take 1 tablet (100 mg total) by mouth 3 (three) times daily as needed for pain. 10 tablet 0   Rimegepant Sulfate (NURTEC) 75 MG TBDP Take 1 tablet (75 mg total) by mouth daily as needed. 16 tablet 11   rizatriptan (MAXALT-MLT) 10 MG disintegrating tablet Dissolve 1 tablet (10 mg total) by mouth as needed at onset of migraine. May take another tablet 2 hours later if needed, max 2 tablets in 24 hours. 27 tablet 3   simethicone (MYLICON) 80 MG chewable tablet Chew 80 mg by mouth every 6 (six) hours as needed for flatulence.     tezepelumab-ekko (TEZSPIRE) 210 MG/1. syringe Inject 1.91 mLs (210 mg total) into the skin every 28  (twenty-eight) days. 1.91 mL 11   tizanidine (ZANAFLEX) 6 MG capsule Take 1 capsule (6 mg total) by mouth at bedtime. 90 capsule 3   valACYclovir (VALTREX) 500 MG tablet Take 2 tablets (1,000 mg total) by mouth 2 (two) times daily. 180 tablet 0   Vibegron (GEMTESA) 75 MG TABS Take 1 tablet (75 mg total) by mouth daily. 90 tablet 0   Vibegron (GEMTESA) 75 MG TABS Take 1 tablet every day by oral route for 90 days. 90 tablet 4   Current Facility-Administered Medications  Medication Dose Route Frequency Provider Last Rate Last Admin   tezepelumab-ekko (TEZSPIRE) 210 MG/1. syringe 210 mg  210 mg Subcutaneous Q28 days Alfonse Spruce, MD   210 mg at 09/11/23 1443      Objective:  Vitals:   10/09/23 0749  BP: 96/62  Pulse: (!) 136  SpO2: 94%  Weight: 178 lb (80.7 kg)  Height: 5\' 6"  (1.676 m)      Body mass index is 28.73 kg/m.    Physical Exam:     Gen: Appears well, nad, nontoxic and pleasant Psych: Alert and oriented, appropriate mood and affect Neuro: sensation intact, strength is 5/5 in upper and lower extremities, muscle tone wnl Skin: no susupicious lesions or rashes  Back - Normal skin, Spine with normal alignment and no deformity.   No tenderness to vertebral process palpation.   Bilateral cervical and thoracic paraspinous muscles are   tender and without spasm TTP bilateral trapezius, rhomboids, levator  Gait normal   Electronically signed by:  [facility] 8:12 AM 10/09/23

## 2023-09-28 ENCOUNTER — Other Ambulatory Visit (HOSPITAL_COMMUNITY): Payer: Self-pay

## 2023-10-02 ENCOUNTER — Other Ambulatory Visit: Payer: Self-pay

## 2023-10-05 ENCOUNTER — Other Ambulatory Visit (HOSPITAL_COMMUNITY): Payer: Self-pay

## 2023-10-05 NOTE — Progress Notes (Signed)
Specialty Pharmacy Refill Coordination Note  Donna Park is a 52 y.o. female contacted today regarding refills of specialty medication(s) [facility]   Patient requested Courier to Provider Office   Delivery date: 10/07/23   Verified address: [address] [place] [address]   Medication will be filled on 10/06/23.

## 2023-10-09 ENCOUNTER — Other Ambulatory Visit: Payer: Self-pay

## 2023-10-09 ENCOUNTER — Other Ambulatory Visit (HOSPITAL_COMMUNITY): Payer: Self-pay

## 2023-10-09 ENCOUNTER — Other Ambulatory Visit: Payer: Self-pay | Admitting: Adult Health

## 2023-10-09 ENCOUNTER — Encounter: Payer: Self-pay | Admitting: Sports Medicine

## 2023-10-09 ENCOUNTER — Encounter: Payer: Self-pay | Admitting: Adult Health

## 2023-10-09 ENCOUNTER — Other Ambulatory Visit: Payer: Self-pay | Admitting: Sports Medicine

## 2023-10-09 ENCOUNTER — Ambulatory Visit (INDEPENDENT_AMBULATORY_CARE_PROVIDER_SITE_OTHER): Payer: 59 | Admitting: *Deleted

## 2023-10-09 ENCOUNTER — Ambulatory Visit (INDEPENDENT_AMBULATORY_CARE_PROVIDER_SITE_OTHER): Payer: 59 | Admitting: Sports Medicine

## 2023-10-09 ENCOUNTER — Other Ambulatory Visit: Payer: Self-pay | Admitting: Allergy & Immunology

## 2023-10-09 ENCOUNTER — Other Ambulatory Visit: Payer: Self-pay | Admitting: Family Medicine

## 2023-10-09 VITALS — BP 96/62 | HR 136 | Ht 66.0 in | Wt 178.0 lb

## 2023-10-09 DIAGNOSIS — M542 Cervicalgia: Secondary | ICD-10-CM | POA: Diagnosis not present

## 2023-10-09 DIAGNOSIS — S46811D Strain of other muscles, fascia and tendons at shoulder and upper arm level, right arm, subsequent encounter: Secondary | ICD-10-CM

## 2023-10-09 DIAGNOSIS — M545 Low back pain, unspecified: Secondary | ICD-10-CM | POA: Diagnosis not present

## 2023-10-09 DIAGNOSIS — G8929 Other chronic pain: Secondary | ICD-10-CM | POA: Diagnosis not present

## 2023-10-09 DIAGNOSIS — J4550 Severe persistent asthma, uncomplicated: Secondary | ICD-10-CM | POA: Diagnosis not present

## 2023-10-09 DIAGNOSIS — G43919 Migraine, unspecified, intractable, without status migrainosus: Secondary | ICD-10-CM

## 2023-10-09 DIAGNOSIS — J4541 Moderate persistent asthma with (acute) exacerbation: Secondary | ICD-10-CM

## 2023-10-09 DIAGNOSIS — R300 Dysuria: Secondary | ICD-10-CM

## 2023-10-09 DIAGNOSIS — S46812D Strain of other muscles, fascia and tendons at shoulder and upper arm level, left arm, subsequent encounter: Secondary | ICD-10-CM | POA: Diagnosis not present

## 2023-10-09 MED ORDER — EPINEPHRINE 0.3 MG/0.3ML IJ SOAJ
0.3000 mg | INTRAMUSCULAR | 1 refills | Status: DC | PRN
  Filled 2023-10-09: qty 2, 9d supply, fill #0

## 2023-10-09 MED ORDER — IBUPROFEN 800 MG PO TABS
800.0000 mg | ORAL_TABLET | Freq: Three times a day (TID) | ORAL | 0 refills | Status: DC | PRN
  Filled 2023-10-09: qty 270, 90d supply, fill #0

## 2023-10-09 MED ORDER — VALACYCLOVIR HCL 500 MG PO TABS
1000.0000 mg | ORAL_TABLET | Freq: Two times a day (BID) | ORAL | 0 refills | Status: DC
  Filled 2023-10-09 – 2023-11-12 (×3): qty 360, 90d supply, fill #0

## 2023-10-09 MED ORDER — ALBUTEROL SULFATE HFA 108 (90 BASE) MCG/ACT IN AERS
1.0000 | INHALATION_SPRAY | Freq: Four times a day (QID) | RESPIRATORY_TRACT | 1 refills | Status: DC | PRN
  Filled 2023-10-09: qty 6.7, 25d supply, fill #0
  Filled 2023-11-12: qty 6.7, 25d supply, fill #1

## 2023-10-09 NOTE — Progress Notes (Signed)
Specialty Pharmacy Refill Coordination Note  Donna Park is a 52 y.o. female contacted today regarding refills of specialty medication(s) Tezepelumab-Ekko   Patient requested Delivery   Delivery date: 10/29/23 (patient wants to use questionnaire for next refill. - will deliver to office closer to nov. appt date. courier 11/21)   Verified address: Allergy asthma   Medication will be filled on 10/28/23.

## 2023-10-09 NOTE — Telephone Encounter (Signed)
Spoke to pt and advised that rx was sent in. Pt requesting 90 instead. 90 sent in to pharmacy.

## 2023-10-12 ENCOUNTER — Other Ambulatory Visit: Payer: Self-pay

## 2023-10-12 ENCOUNTER — Other Ambulatory Visit: Payer: Self-pay | Admitting: Sports Medicine

## 2023-10-12 ENCOUNTER — Other Ambulatory Visit (HOSPITAL_COMMUNITY): Payer: Self-pay

## 2023-10-12 DIAGNOSIS — Z9884 Bariatric surgery status: Secondary | ICD-10-CM

## 2023-10-12 DIAGNOSIS — M810 Age-related osteoporosis without current pathological fracture: Secondary | ICD-10-CM

## 2023-10-12 MED ORDER — RISEDRONATE SODIUM 150 MG PO TABS
150.0000 mg | ORAL_TABLET | ORAL | 1 refills | Status: DC
  Filled 2023-10-12 – 2024-06-17 (×6): qty 3, 90d supply, fill #0

## 2023-10-12 NOTE — Progress Notes (Signed)
With history of Roux en y gastric bypass and history of osteoporosis, and difficulty remembering to take medication once per week, we will discontinue fosamax and start risedronate 150mg  monthly. New Rx sent into pharmacy. Medication must be taken at least 30 minutes before or after any other food or drink. Patient must be upright for 30 minutes after taking medication.

## 2023-10-14 ENCOUNTER — Other Ambulatory Visit: Payer: Self-pay

## 2023-10-15 ENCOUNTER — Other Ambulatory Visit (HOSPITAL_COMMUNITY): Payer: Self-pay

## 2023-10-15 ENCOUNTER — Other Ambulatory Visit (HOSPITAL_COMMUNITY): Payer: Self-pay | Admitting: Pharmacy Technician

## 2023-10-15 ENCOUNTER — Ambulatory Visit (INDEPENDENT_AMBULATORY_CARE_PROVIDER_SITE_OTHER): Payer: 59 | Admitting: Allergy & Immunology

## 2023-10-15 ENCOUNTER — Other Ambulatory Visit: Payer: Self-pay

## 2023-10-15 ENCOUNTER — Encounter: Payer: Self-pay | Admitting: Allergy & Immunology

## 2023-10-15 VITALS — BP 126/78 | HR 78 | Temp 98.5°F | Resp 17

## 2023-10-15 DIAGNOSIS — J302 Other seasonal allergic rhinitis: Secondary | ICD-10-CM | POA: Diagnosis not present

## 2023-10-15 DIAGNOSIS — J3089 Other allergic rhinitis: Secondary | ICD-10-CM | POA: Diagnosis not present

## 2023-10-15 DIAGNOSIS — B999 Unspecified infectious disease: Secondary | ICD-10-CM

## 2023-10-15 DIAGNOSIS — J4550 Severe persistent asthma, uncomplicated: Secondary | ICD-10-CM | POA: Diagnosis not present

## 2023-10-15 DIAGNOSIS — J45998 Other asthma: Secondary | ICD-10-CM | POA: Diagnosis not present

## 2023-10-15 DIAGNOSIS — T7800XD Anaphylactic reaction due to unspecified food, subsequent encounter: Secondary | ICD-10-CM

## 2023-10-15 DIAGNOSIS — M810 Age-related osteoporosis without current pathological fracture: Secondary | ICD-10-CM

## 2023-10-15 MED ORDER — FLUCONAZOLE 150 MG PO TABS
150.0000 mg | ORAL_TABLET | Freq: Every day | ORAL | 5 refills | Status: DC
  Filled 2023-10-15 – 2023-11-12 (×2): qty 14, 14d supply, fill #0
  Filled 2023-11-23: qty 14, 14d supply, fill #1
  Filled 2024-03-02: qty 14, 14d supply, fill #2
  Filled 2024-06-17 (×2): qty 14, 14d supply, fill #3

## 2023-10-15 NOTE — Progress Notes (Signed)
[facility] [address] [place] [address] Phone: [phone]   Assessment and Plan:     1. Chronic bilateral low back pain without sciatica 2. Neck pain 3. Somatic dysfunction of cervical region 4. Somatic dysfunction of thoracic region 5. Somatic dysfunction of lumbar region 6. Somatic dysfunction of pelvic region 7. Somatic dysfunction of rib region -Chronic with exacerbation, subsequent visit - Recurrent flare of multiple musculoskeletal pains including neck, low back, bilateral hips - Patient received moderate relief with trigger point ejections at previous office visit - Patient has received relief with OMT in the past.  Elects for repeat OMT today.  Tolerated well per note below. - Decision today to treat with OMT was based on Physical Exam  After verbal consent patient was treated with HVLA (high velocity low amplitude), ME (muscle energy), FPR (flex positional release), ST (soft tissue), PC/PD (Pelvic Compression/ Pelvic Decompression) techniques in cervical, rib, thoracic, lumbar, and pelvic areas. Patient tolerated the procedure well with improvement in symptoms.  Patient educated on potential side effects of soreness and recommended to rest, hydrate, and use Tylenol as needed for pain control.   8. Osteoporosis without current pathological fracture, unspecified osteoporosis type 9. History of Roux-en-Y gastric bypass - Chronic, subsequent visit - With history of Roux en y gastric bypass and history of osteoporosis, and difficulty remembering to take medication once per week, we will discontinue fosamax and start risedronate 150mg  monthly. New Rx already sent into pharmacy. Medication must be taken at least 30 minutes before or after any other food or drink. Patient must be upright for 30 minutes after taking medication.   Follow Up: 4 weeks for reevaluation.  Could consider repeat OMT   Subjective:   I, Donna Park, am serving as a Neurosurgeon for Doctor [employer]  Chief Complaint: neck pain    HPI:    08/07/2023 Patient states her low back and neck are flared and here for adjustment    09/04/2023 Patient states that her upper trap is tight , right shoulder tightness and pain, low back flare of tightness she isnt able to stand up straight    10/09/2023 Patient states that she is tight. Has been trying to do some stretches.   10/16/2023 Patient states neck  and shoulder flare    Relevant Historical Information: Migraines, GERD, history of Roux-en-Y gastric bypass  Additional pertinent review of systems negative.   Current Outpatient Medications:    acetaminophen (TYLENOL) 500 MG tablet, Take by mouth., Disp: , Rfl:    acyclovir ointment (ZOVIRAX) 5 %, Apply onto the affected area every 3 hours., Disp: 15 g, Rfl: 1   albuterol (VENTOLIN HFA) 108 (90 Base) MCG/ACT inhaler, Inhale 1 - 2 puffs into the lungs every 6 hours as needed for wheezing or shortness of breath., Disp: 6.7 g, Rfl: 1   amitriptyline (ELAVIL) 10 MG tablet, Take 1 tablet (10 mg total) by mouth at bedtime., Disp: 90 tablet, Rfl: 1   Azelastine-Fluticasone (DYMISTA) 137-50 MCG/ACT SUSP, Place 2 sprays into both nostrils 2 (two) times daily as needed., Disp: 23 g, Rfl: 5   botulinum toxin Type A (BOTOX) 200 units injection, Provider to inject 155 units into the muscles of the head and neck every 12 weeks. Discard remainder., Disp: 1 each, Rfl: 3   CALCIUM PO, Take 1 tablet by mouth daily in the afternoon., Disp: , Rfl:    cetirizine (ZYRTEC) 10 MG tablet, Take 2 tablets (20  mg total) by mouth 2 (two) times daily., Disp: 360 tablet, Rfl: 1   diazepam (VALIUM) 10 MG tablet, Take 1 tablet (10 mg total) by mouth at bedtime as needed for sleep., Disp: 30 tablet, Rfl: 2   EPINEPHrine 0.3 mg/0.3 mL IJ SOAJ injection, Inject 0.3 mg into the muscle as needed for anaphylaxis., Disp: 2 each, Rfl: 1   fluconazole (DIFLUCAN) 150 MG tablet,  Take 1 tablet (150 mg total) by mouth daily., Disp: 14 tablet, Rfl: 5   Fluticasone-Umeclidin-Vilant (TRELEGY ELLIPTA) 200-62.5-25 MCG/ACT AEPB, Inhale 1 puff into the lungs daily., Disp: 60 each, Rfl: 5   Galcanezumab-gnlm (EMGALITY) 120 MG/ML SOAJ, Inject 120 mg into the skin every 30 (thirty) days., Disp: 3 mL, Rfl: 3   Hypertonic Nasal Wash (SINUS RINSE REFILL) PACK, Use one packet dissolved in water as needed. (Patient taking differently: Place 1 each into the nose in the morning and at bedtime.), Disp: 100 each, Rfl: 5   ibuprofen (ADVIL) 800 MG tablet, Take 1 tablet (800 mg total) by mouth 3 (three) times daily as needed (pain.)., Disp: 270 tablet, Rfl: 0   ipratropium (ATROVENT) 0.06 % nasal spray, Place 2 sprays into both nostrils 4 (four) times daily., Disp: 45 mL, Rfl: 5   linaclotide (LINZESS) 290 MCG CAPS capsule, Take 1 capsule (290 mcg total) by mouth daily before breakfast., Disp: 90 capsule, Rfl: 1   meloxicam (MOBIC) 15 MG tablet, Take 1 tablet (15 mg total) by mouth at bedtime as needed for pain., Disp: 90 tablet, Rfl: 3   nystatin (MYCOSTATIN) 100000 UNIT/ML suspension, Take 5 mLs (500,000 Units total) by mouth 4 (four) times daily. Gargle and spit., Disp: 473 mL, Rfl: 0   ondansetron (ZOFRAN-ODT) 4 MG disintegrating tablet, Dissolve 1-2 tablets (4-8 mg total) by mouth every 8 (eight) hours as needed. May take with Rizatriptan, Disp: 90 tablet, Rfl: 3   pantoprazole (PROTONIX) 40 MG tablet, Take 1 tablet (40 mg total) by mouth at bedtime., Disp: 90 tablet, Rfl: 3   Pediatric Multiple Vit-C-FA (MULTIVITAMIN ANIMAL SHAPES, WITH CA/FA,) with C & FA chewable tablet, Chew 2 tablets by mouth daily., Disp: , Rfl:    phenazopyridine (PYRIDIUM) 100 MG tablet, Take 1 tablet (100 mg total) by mouth 3 (three) times daily as needed for pain., Disp: 10 tablet, Rfl: 0   Rimegepant Sulfate (NURTEC) 75 MG TBDP, Take 1 tablet (75 mg total) by mouth daily as needed., Disp: 16 tablet, Rfl: 11    risedronate (ACTONEL) 150 MG tablet, Take 1 tablet (150 mg total) by mouth every 30 (thirty) days. with water on empty stomach, nothing by mouth or lie down for next 30 minutes., Disp: 12 tablet, Rfl: 1   rizatriptan (MAXALT-MLT) 10 MG disintegrating tablet, Dissolve 1 tablet (10 mg total) by mouth as needed at onset of migraine. May take another tablet 2 hours later if needed, max 2 tablets in 24 hours., Disp: 27 tablet, Rfl: 3   simethicone (MYLICON) 80 MG chewable tablet, Chew 80 mg by mouth every 6 (six) hours as needed for flatulence., Disp: , Rfl:    tezepelumab-ekko (TEZSPIRE) 210 MG/1. syringe, Inject 1.91 mLs (210 mg total) into the skin every 28 (twenty-eight) days., Disp: 1.91 mL, Rfl: 11   tizanidine (ZANAFLEX) 6 MG capsule, Take 1 capsule (6 mg total) by mouth at bedtime., Disp: 90 capsule, Rfl: 3   triamcinolone ointment (KENALOG) 0.1 %, Apply topically 2 (two) times daily., Disp: , Rfl:    valACYclovir (VALTREX) 500 MG tablet,  Take 2 tablets (1,000 mg total) by mouth 2 (two) times daily., Disp: 360 tablet, Rfl: 0   Vibegron (GEMTESA) 75 MG TABS, Take 1 tablet every day by oral route for 90 days., Disp: 90 tablet, Rfl: 4  Current Facility-Administered Medications:    tezepelumab-ekko (TEZSPIRE) 210 MG/1. syringe 210 mg, 210 mg, Subcutaneous, Q28 days, Alfonse Spruce, MD, 210 mg at 10/09/23 1106   Objective:     Vitals:   10/16/23 1002  BP: 122/82  Pulse: 72  SpO2: 98%  Weight: 178 lb (80.7 kg)  Height: 5\' 6"  (1.676 m)      Body mass index is 28.73 kg/m.    Physical Exam:    General: Well-appearing, cooperative, sitting comfortably in no acute distress.   OMT Physical Exam:  ASIS Compression Test: Positive Right Cervical: TTP paraspinal, C4 RLSR Rib: Bilateral elevated first rib with TTP Thoracic: TTP paraspinal, T4 RRSR, T8 RRSR Lumbar: TTP paraspinal, L1-3 RRSL Pelvis: Right anterior innominate    Electronically signed by:  [facility] 10:48 AM 10/16/23

## 2023-10-15 NOTE — Progress Notes (Signed)
FOLLOW UP  Date of Service/Encounter:  10/15/23   Assessment:   Moderate persistent asthma with acute exacerbation   Seasonal and perennial allergic rhinitis (grass, trees, molds, cat, dust mite, cockroach) - s/p five years of allergen immunotherapy)   Anaphylactic shock due to food (shrimp) - tolerates other shellfish  Recurrent oral thrush - getting immune work up today   Itching   S/p hysterectomy (June 2021)   S/p gastric bypass (2016)  Plan/Recommendations:   1. Moderate persistent asthma, uncomplicated - Lung testing looked AWESOME. - We are not going to make any medication changes.  - Make sure that Donna Park keeps your asthma under good control since I did such a good job!  - Spacer provided today. - Daily controller medication(s): Trelegy 200/62.5/25 one puff once daily and Tezspire every four weeks. - Prior to physical activity: albuterol 2 puffs 10-15 minutes before physical activity. - Rescue medications: albuterol 4 puffs every 4-6 hours as needed - Changes during respiratory infections or worsening symptoms: ADD ON Arnuity to 1 puff twice daily for ONE TO TWO WEEKS. - Asthma control goals:  * Full participation in all desired activities (may need albuterol before activity) * Albuterol use two time or less a week on average (not counting use with activity) * Cough interfering with sleep two time or less a month * Oral steroids no more than once a year * No hospitalizations  2. Seasonal and perennial allergic rhinitis - Continue with cetirizine 20mg  twice daily. - Continue ipratropium bromide nasal spray 2 sprays each nostril twice a day as needed. Do not use on the same day that you use Dymista - Continue with Dymista one sprays per nostril up to twice daily. - Continue with nasal saline rinses.   3. Anaphylactic shock due to food - Avoid shrimp. - EpiPen is up to date.   4. Keflex allergy - Consider doing a Keflex challenge - Consider doing a  Bactrim challenge.  5. Return in about 6 months (around 04/13/2024).   Subjective:   Donna Park is a 52 y.o. female presenting today for follow up of  Chief Complaint  Patient presents with   Asthma    Donna Park has a history of the following: Patient Active Problem List   Diagnosis Date Noted   Dysuria 08/14/2023   Dysphagia    LUQ pain 03/01/2022   Change in bowel habits 03/01/2022   Loss of weight 03/01/2022   Chronic migraine without aura without status migrainosus, not intractable 06/14/2021   S/P laparoscopic assisted vaginal hysterectomy (LAVH) 05/15/2020   Migraines    GERD (gastroesophageal reflux disease)    Asthma    Seasonal and perennial allergic rhinitis 02/10/2019   Moderate persistent asthma, uncomplicated 02/10/2019   Anaphylactic shock due to adverse food reaction 02/10/2019   Atopic dermatitis 02/10/2019   History of Roux-en-Y gastric bypass 2016    History obtained from: chart review and patient.  Discussed the use of AI scribe software for clinical note transcription with the patient and/or guardian, who gave verbal consent to proceed.  Donna Park is a 52 y.o. female presenting for a follow up visit.  She was last seen in December 2023.  At that time, her lung testing looked awesome.  We continue with Trelegy 200 mcg 1 puff daily as well as Tezspire every 4 weeks.  For her rhinitis, we continue with cetirizine as well as ipratropium and Dymista.  She continue to avoid shrimp.  EpiPen is up-to-date.  She was experiencing what we thought was a Keflex allergy.  We changed her to cefdinir to complete her course for her UTI.  Since last visit, she has largely done well.   Donna Park reports a recent onset of oral thrush. They noticed a white coating in their mouth around Saturday, which they self-diagnosed as thrush. They also report a sharp sensation at the back of their uvula.   Asthma/Respiratory Symptom History: She is doing well on the Tezspire. She has  Trelegy but is not using it completely consistently. She has not been using her rescue inhaler at all. She has not been on prednisone ofr her symptoms in quite some time. She does not remember the last time that we needed to use it.    They also mention a history of anxiety, which has been exacerbated recently due to external stressors. The patient's social history is notable for a tumultuous romantic relationship, which they plan to end in the near future.   Allergic Rhinitis Symptom History: She remains on the cetirizine one tablet daily. She has Atrovent to use as needed. She also has some Dymista to use when her symptoms become particularly terrible. She is on nasal saline rinses. She has not been on antibiotics at all for her symptoms. Overall, allergic rhinitis symptoms are under good control.   Food Allergy Symptom History: She continues to avoid shrimp. She is not interested in re-introducing this into her diet at all. She does have an up to date EpiPen.   Otherwise, there have been no changes to her past medical history, surgical history, family history, or social history.    Review of systems otherwise negative other than that mentioned in the HPI.    Objective:   Blood pressure 126/78, pulse 78, temperature 98.5 F (36.9 C), temperature source Temporal, resp. rate 17, SpO2 98%. There is no height or weight on file to calculate BMI.    Physical Exam Vitals reviewed.  Constitutional:      Appearance: She is well-developed.     Comments: Very pleasant female.  Talkative. Personable.   HENT:     Head: Normocephalic and atraumatic.     Right Ear: Tympanic membrane, ear canal and external ear normal.     Left Ear: Tympanic membrane, ear canal and external ear normal.     Nose: No nasal deformity, septal deviation, mucosal edema or rhinorrhea.     Right Turbinates: Enlarged, swollen and pale.     Left Turbinates: Enlarged, swollen and pale.     Right Sinus: No maxillary sinus  tenderness or frontal sinus tenderness.     Left Sinus: No maxillary sinus tenderness or frontal sinus tenderness.     Comments: No nasal polyps noted.    Mouth/Throat:     Mouth: Mucous membranes are not pale and not dry.     Pharynx: Uvula midline.  Eyes:     General: Lids are normal. No allergic shiner.       Right eye: No discharge.        Left eye: No discharge.     Conjunctiva/sclera: Conjunctivae normal.     Right eye: Right conjunctiva is not injected. No chemosis.    Left eye: Left conjunctiva is not injected. No chemosis.    Pupils: Pupils are equal, round, and reactive to light.  Cardiovascular:     Rate and Rhythm: Normal rate and regular rhythm.     Heart sounds: Normal heart sounds.  Pulmonary:     Effort: Pulmonary  effort is normal. No tachypnea, accessory muscle usage or respiratory distress.     Breath sounds: Normal breath sounds. No wheezing, rhonchi or rales.     Comments: Moving air well in all lung fields.  No increased work of breathing. Chest:     Chest wall: No tenderness.  Lymphadenopathy:     Cervical: No cervical adenopathy.  Skin:    Coloration: Skin is not pale.     Findings: No abrasion, erythema, petechiae or rash. Rash is not papular, urticarial or vesicular.  Neurological:     Mental Status: She is alert.  Psychiatric:        Behavior: Behavior is cooperative.      Diagnostic studies:    Spirometry: results normal (FEV1: 2.01/79%, FVC: 2.50/79%, FEV1/FVC: 80%).    Spirometry consistent with normal pattern.    Allergy Studies: none        Malachi Bonds, MD  [facility]

## 2023-10-15 NOTE — Progress Notes (Signed)
Specialty Pharmacy Refill Coordination Note  Donna Park is a 52 y.o. female contacted today regarding refills of specialty medication(s) Onabotulinumtoxina   Patient requested Courier to Provider Office   Delivery date: 10/20/23   Verified address: [facility]   Medication will be filled on 10/19/23.

## 2023-10-15 NOTE — Patient Instructions (Addendum)
She t1. Moderate persistent asthma, uncomplicated - Lung testing looked AWESOME. - We are not going to make any medication changes.  - Make sure that Trudie Buckler keeps your asthma under good control since I did such a good job!  - Spacer provided today. - Daily controller medication(s): Trelegy 200/62.5/25 one puff once daily and Tezspire every four weeks. - Prior to physical activity: albuterol 2 puffs 10-15 minutes before physical activity. - Rescue medications: albuterol 4 puffs every 4-6 hours as needed - Changes during respiratory infections or worsening symptoms: ADD ON Arnuity to 1 puff twice daily for ONE TO TWO WEEKS. - Asthma control goals:  * Full participation in all desired activities (may need albuterol before activity) * Albuterol use two time or less a week on average (not counting use with activity) * Cough interfering with sleep two time or less a month * Oral steroids no more than once a year * No hospitalizations  2. Seasonal and perennial allergic rhinitis - Continue with cetirizine 20mg  twice daily. - Continue ipratropium bromide nasal spray 2 sprays each nostril twice a day as needed. Do not use on the same day that you use Dymista - Continue with Dymista one sprays per nostril up to twice daily. - Continue with nasal saline rinses.   3. Anaphylactic shock due to food - Avoid shrimp. - EpiPen is up to date.   4. Keflex allergy - Consider doing a Keflex challenge - Consider doing a Bactrim challenge.  5. Return in about 6 months (around 04/13/2024).    Please inform us of any Emergency Department visits, hospitalizations, or changes in symptoms. Call us before going to the ED for breathing or allergy symptoms since we might be able to fit you in for a sick visit. Feel free to contact us anytime with any questions, problems, or concerns.  It was a pleasure to see you again today!  Websites that have reliable patient information: 1. American Academy of Asthma,  Allergy, and Immunology: www.aaaai.org 2. Food Allergy Research and Education (FARE): foodallergy.org 3. Mothers of Asthmatics: http://www.asthmacommunitynetwork.org 4. American College of Allergy, Asthma, and Immunology: www.acaai.org   COVID-19 Vaccine Information can be found at: [email or web address] For questions related to vaccine distribution or appointments, please email [email or web address] or call [phone].   We realize that you might be concerned about having an allergic reaction to the COVID19 vaccines. To help with that concern, WE ARE OFFERING THE COVID19 VACCINES IN OUR OFFICE! Ask the front desk for dates!     "Like" [place] on Facebook and Instagram for our latest updates!      A healthy democracy works best when [employer] participate! Make sure you are registered to vote! If you have moved or changed any of your contact information, you will need to get this updated before voting!  In some cases, you MAY be able to register to vote online: [email or web address]

## 2023-10-16 ENCOUNTER — Other Ambulatory Visit: Payer: Self-pay

## 2023-10-16 ENCOUNTER — Ambulatory Visit (INDEPENDENT_AMBULATORY_CARE_PROVIDER_SITE_OTHER): Payer: 59 | Admitting: Sports Medicine

## 2023-10-16 ENCOUNTER — Other Ambulatory Visit (HOSPITAL_COMMUNITY): Payer: Self-pay

## 2023-10-16 VITALS — BP 122/82 | HR 72 | Ht 66.0 in | Wt 178.0 lb

## 2023-10-16 DIAGNOSIS — M9905 Segmental and somatic dysfunction of pelvic region: Secondary | ICD-10-CM | POA: Diagnosis not present

## 2023-10-16 DIAGNOSIS — M9902 Segmental and somatic dysfunction of thoracic region: Secondary | ICD-10-CM

## 2023-10-16 DIAGNOSIS — M9901 Segmental and somatic dysfunction of cervical region: Secondary | ICD-10-CM | POA: Diagnosis not present

## 2023-10-16 DIAGNOSIS — M810 Age-related osteoporosis without current pathological fracture: Secondary | ICD-10-CM

## 2023-10-16 DIAGNOSIS — Z9884 Bariatric surgery status: Secondary | ICD-10-CM

## 2023-10-16 DIAGNOSIS — M9903 Segmental and somatic dysfunction of lumbar region: Secondary | ICD-10-CM

## 2023-10-16 DIAGNOSIS — M545 Low back pain, unspecified: Secondary | ICD-10-CM | POA: Diagnosis not present

## 2023-10-16 DIAGNOSIS — G8929 Other chronic pain: Secondary | ICD-10-CM

## 2023-10-16 DIAGNOSIS — M542 Cervicalgia: Secondary | ICD-10-CM

## 2023-10-16 DIAGNOSIS — M9908 Segmental and somatic dysfunction of rib cage: Secondary | ICD-10-CM

## 2023-10-19 ENCOUNTER — Other Ambulatory Visit: Payer: Self-pay

## 2023-10-22 ENCOUNTER — Other Ambulatory Visit: Payer: Self-pay

## 2023-10-26 ENCOUNTER — Encounter: Payer: Self-pay | Admitting: *Deleted

## 2023-10-27 ENCOUNTER — Ambulatory Visit (INDEPENDENT_AMBULATORY_CARE_PROVIDER_SITE_OTHER): Payer: 59 | Admitting: Neurology

## 2023-10-27 ENCOUNTER — Other Ambulatory Visit (HOSPITAL_COMMUNITY): Payer: Self-pay

## 2023-10-27 DIAGNOSIS — G43709 Chronic migraine without aura, not intractable, without status migrainosus: Secondary | ICD-10-CM

## 2023-10-27 MED ORDER — ONABOTULINUMTOXINA 200 UNITS IJ SOLR
155.0000 [IU] | Freq: Once | INTRAMUSCULAR | Status: AC
  Administered 2023-10-27: 155 [IU] via INTRAMUSCULAR

## 2023-10-27 NOTE — Progress Notes (Signed)
Consent Form Botulism Toxin Injection For Chronic Migraine  10/27/2023: Stable. Feels jaw pain during migraines that is caused by migraine and worsens migraines, injected 10u in masseters bilat and states this has helped her migraines  08/04/2023: >50 % relief migraine and headache  freq and severity Tught muscles will give tizanidine Discussed meloxicam, don;t use with ibuprofen in the same class, watch kidney function Also watch for medication overuse taking too much ibuprofen or tylenol or triptans  No orders of the defined types were placed in this encounter.    Reviewed orally with patient, additionally signature is on file:  Botulism toxin has been approved by the Federal drug administration for treatment of chronic migraine. Botulism toxin does not cure chronic migraine and it may not be effective in some patients.  The administration of botulism toxin is accomplished by injecting a small amount of toxin into the muscles of the neck and head. Dosage must be titrated for each individual. Any benefits resulting from botulism toxin tend to wear off after 3 months with a repeat injection required if benefit is to be maintained. Injections are usually done every 3-4 months with maximum effect peak achieved by about 2 or 3 weeks. Botulism toxin is expensive and you should be sure of what costs you will incur resulting from the injection.  The side effects of botulism toxin use for chronic migraine may include:   -Transient, and usually mild, facial weakness with facial injections  -Transient, and usually mild, head or neck weakness with head/neck injections  -Reduction or loss of forehead facial animation due to forehead muscle weakness  -Eyelid drooping  -Dry eye  -Pain at the site of injection or bruising at the site of injection  -Double vision  -Potential unknown long term risks  Contraindications: You should not have Botox if you are pregnant, nursing, allergic to albumin, have an  infection, skin condition, or muscle weakness at the site of the injection, or have myasthenia gravis, Lambert-Eaton syndrome, or ALS.  It is also possible that as with any injection, there may be an allergic reaction or no effect from the medication. Reduced effectiveness after repeated injections is sometimes seen and rarely infection at the injection site may occur. All care will be taken to prevent these side effects. If therapy is given over a long time, atrophy and wasting in the muscle injected may occur. Occasionally the patient's become refractory to treatment because they develop antibodies to the toxin. In this event, therapy needs to be modified.  I have read the above information and consent to the administration of botulism toxin.    BOTOX PROCEDURE NOTE FOR MIGRAINE HEADACHE    Contraindications and precautions discussed with patient(above). Aseptic procedure was observed and patient tolerated procedure. Procedure performed by Dr. Artemio Aly  The condition has existed for more than 6 months, and pt does not have a diagnosis of ALS, Myasthenia Gravis or Lambert-Eaton Syndrome.  Risks and benefits of injections discussed and pt agrees to proceed with the procedure.  Written consent obtained  These injections are medically necessary. Pt  receives good benefits from these injections. These injections do not cause sedations or hallucinations which the oral therapies may cause.  Description of procedure:  The patient was placed in a sitting position. The standard protocol was used for Botox as follows, with 5 units of Botox injected at each site:   -Procerus muscle, midline injection  -Corrugator muscle, bilateral injection  -Frontalis muscle, bilateral injection, with 2 sites each side,  medial injection was performed in the upper one third of the frontalis muscle, in the region vertical from the medial inferior edge of the superior orbital rim. The lateral injection was again in  the upper one third of the forehead vertically above the lateral limbus of the cornea, 1.5 cm lateral to the medial injection site.  -Temporalis muscle injection, 4 sites, bilaterally. The first injection was 3 cm above the tragus of the ear, second injection site was 1.5 cm to 3 cm up from the first injection site in line with the tragus of the ear. The third injection site was 1.5-3 cm forward between the first 2 injection sites. The fourth injection site was 1.5 cm posterior to the second injection site.   -Occipitalis muscle injection, 3 sites, bilaterally. The first injection was done one half way between the occipital protuberance and the tip of the mastoid process behind the ear. The second injection site was done lateral and superior to the first, 1 fingerbreadth from the first injection. The third injection site was 1 fingerbreadth superiorly and medially from the first injection site.  -Cervical paraspinal muscle injection, 2 sites, bilateral knee first injection site was 1 cm from the midline of the cervical spine, 3 cm inferior to the lower border of the occipital protuberance. The second injection site was 1.5 cm superiorly and laterally to the first injection site.  -Trapezius muscle injection was performed at 3 sites, bilaterally. The first injection site was in the upper trapezius muscle halfway between the inflection point of the neck, and the acromion. The second injection site was one half way between the acromion and the first injection site. The third injection was done between the first injection site and the inflection point of the neck.   Will return for repeat injection in 3 months.   200 units of Botox was used, any Botox not injected was wasted. The patient tolerated the procedure well, there were no complications of the above procedure.

## 2023-10-27 NOTE — Progress Notes (Signed)
Botox- 200 units x 1 vial Lot: [id number] Expiration: 02/2026 NDC: 4540-9811-91  Bacteriostatic 0.9% Sodium Chloride- 4 mL  Lot: [id number] Expiration: 03-08-2024 NDC: 6213-0865-78  Dx: G43.709 S/P  Witnessed by Delmer Islam

## 2023-11-01 LAB — STREP PNEUMONIAE 23 SEROTYPES IGG
Pneumo Ab Type 1*: 0.2 ug/mL — ABNORMAL LOW
Pneumo Ab Type 12 (12F)*: <0.1 ug/mL — ABNORMAL LOW
Pneumo Ab Type 14*: 6.1 ug/mL
Pneumo Ab Type 17 (17F)*: 1.5 ug/mL
Pneumo Ab Type 19 (19F)*: 1.6 ug/mL
Pneumo Ab Type 2*: 0.6 ug/mL — ABNORMAL LOW
Pneumo Ab Type 20*: 0.6 ug/mL — ABNORMAL LOW
Pneumo Ab Type 22 (22F)*: <0.1 ug/mL — ABNORMAL LOW
Pneumo Ab Type 23 (23F)*: 1.7 ug/mL
Pneumo Ab Type 26 (6B)*: 1.9 ug/mL
Pneumo Ab Type 3*: 0.1 ug/mL — ABNORMAL LOW
Pneumo Ab Type 34 (10A)*: 0.5 ug/mL — ABNORMAL LOW
Pneumo Ab Type 4*: 0.4 ug/mL — ABNORMAL LOW
Pneumo Ab Type 43 (11A)*: 1.2 ug/mL — ABNORMAL LOW
Pneumo Ab Type 5*: 1 ug/mL — ABNORMAL LOW
Pneumo Ab Type 51 (7F)*: 0.5 ug/mL — ABNORMAL LOW
Pneumo Ab Type 54 (15B)*: 0.4 ug/mL — ABNORMAL LOW
Pneumo Ab Type 56 (18C)*: 0.7 ug/mL — ABNORMAL LOW
Pneumo Ab Type 57 (19A)*: 0.9 ug/mL — ABNORMAL LOW
Pneumo Ab Type 68 (9V)*: 0.5 ug/mL — ABNORMAL LOW
Pneumo Ab Type 70 (33F)*: 0.9 ug/mL — ABNORMAL LOW
Pneumo Ab Type 8*: 1.5 ug/mL
Pneumo Ab Type 9 (9N)*: 0.5 ug/mL — ABNORMAL LOW

## 2023-11-01 LAB — IGG, IGA, IGM
IgA/Immunoglobulin A, Serum: 299 mg/dL (ref 87–352)
IgG (Immunoglobin G), Serum: 1202 mg/dL (ref 586–1602)
IgM (Immunoglobulin M), Srm: 45 mg/dL (ref 26–217)

## 2023-11-01 LAB — LYMPH ENUMERATION, BASIC & NK CELLS
% CD 3 Pos. Lymph.: 79.8 % (ref 57.5–86.2)
% CD 4 Pos. Lymph.: 53.3 % (ref 30.8–58.5)
% NK (CD56/16): 4 % (ref 1.4–19.4)
Ab NK (CD56/16): 72 /uL (ref 24–406)
Absolute CD 3: 1436 /uL (ref 622–2402)
Absolute CD 4 Helper: 959 /uL (ref 359–1519)
Basophils Absolute: 0 10*3/uL (ref 0.0–0.2)
Basos: 1 %
CD19 % B Cell: 14.8 % (ref 3.3–25.4)
CD19 Abs: 266 /uL (ref 12–645)
CD4/CD8 Ratio: 2.08 (ref 0.92–3.72)
CD8 % Suppressor T Cell: 25.6 % (ref 12.0–35.5)
CD8 T Cell Abs: 461 /uL (ref 109–897)
EOS (ABSOLUTE): 0 10*3/uL (ref 0.0–0.4)
Eos: 0 %
Hematocrit: 38.7 % (ref 34.0–46.6)
Hemoglobin: 12.7 g/dL (ref 11.1–15.9)
Immature Grans (Abs): 0 10*3/uL (ref 0.0–0.1)
Immature Granulocytes: 0 %
Lymphocytes Absolute: 1.8 10*3/uL (ref 0.7–3.1)
Lymphs: 31 %
MCH: 31.1 pg (ref 26.6–33.0)
MCHC: 32.8 g/dL (ref 31.5–35.7)
MCV: 95 fL (ref 79–97)
Monocytes Absolute: 0.6 10*3/uL (ref 0.1–0.9)
Monocytes: 9 %
Neutrophils Absolute: 3.5 10*3/uL (ref 1.4–7.0)
Neutrophils: 59 %
Platelets: 179 10*3/uL (ref 150–450)
RBC: 4.09 x10E6/uL (ref 3.77–5.28)
RDW: 12.7 % (ref 11.7–15.4)
WBC: 5.9 10*3/uL (ref 3.4–10.8)

## 2023-11-01 LAB — DIPHTHERIA / TETANUS ANTIBODY PANEL
Diphtheria Ab: 1.2 [IU]/mL (ref <0.10)
Tetanus Ab, IgG: 6.27 [IU]/mL (ref <0.10)

## 2023-11-01 LAB — VITAMIN D 25 HYDROXY (VIT D DEFICIENCY, FRACTURES): Vit D, 25-Hydroxy: 36 ng/mL (ref 30.0–100.0)

## 2023-11-01 LAB — T-HELPER CELLS CD4/CD8 %
% CD 4 Pos. Lymph.: 51.7 % (ref 30.8–58.5)
Absolute CD 4 Helper: 931 /uL (ref 359–1519)
CD3+CD4+ Cells/CD3+CD8+ Cells Bld: 1.83 (ref 0.92–3.72)
CD3+CD8+ Cells # Bld: 508 /uL (ref 109–897)
CD3+CD8+ Cells NFr Bld: 28.2 % (ref 12.0–35.5)

## 2023-11-01 LAB — COMPLEMENT, TOTAL: Compl, Total (CH50): >60 U/mL (ref >41)

## 2023-11-11 ENCOUNTER — Encounter: Payer: 59 | Attending: Adult Health | Admitting: Dietician

## 2023-11-11 NOTE — Patient Instructions (Addendum)
1-Consider the gym here at work  [email or web address]  [facility] has two fitness rooms designed to accommodate your exercise needs! Each is equipped with Emergency planning/management officer including standard ellipticals, Adaptive Motion Trainers (advanced ellipticals), recumbent bikes, treadmills, dumbbells, and a Mining engineer System (resistance training). A cardio theater is available to make for a more enjoyable experience during cardiovascular workouts! Additionally, the 1st floor Employee Fitness Center contains dynamic training equipment including kettlebells and medicine balls. It is located in room [phone] and the 4th floor Employee Fitness Center is located in room [phone]. Both rooms are open to employees 24/7.

## 2023-11-11 NOTE — Progress Notes (Unsigned)
Medical Nutrition Therapy  Appointment Start time:  [phone]  Appointment End time:  1819  Primary concerns today: previous roux-en-Y in 2016 and adequate protein intake, osteoporosis, constipation   Referral diagnosis: none employee Preferred learning style: no preference indicated Learning readiness: maintenance phase    NUTRITION ASSESSMENT   Clinical Medical Hx:  Past Medical History:  Diagnosis Date   Allergy    Anxiety    Arthritis    Asthma    Chicken pox    Complication of anesthesia    slow to wake up from anesthesia   Depression    DJD (degenerative joint disease)    GERD (gastroesophageal reflux disease)    resolved after gastric surgery   H/O gastric bypass 2016   History of iron deficiency anemia    HSV infection    Migraines    Obese    Pneumonia    Childhood history   PONV (postoperative nausea and vomiting)    Status post dilation of esophageal narrowing    Varicose vein of leg     Medications:  Current Outpatient Medications:    acetaminophen (TYLENOL) 500 MG tablet, Take by mouth., Disp: , Rfl:    acyclovir ointment (ZOVIRAX) 5 %, Apply onto the affected area every 3 hours., Disp: 15 g, Rfl: 1   albuterol (VENTOLIN HFA) 108 (90 Base) MCG/ACT inhaler, Inhale 1 - 2 puffs into the lungs every 6 hours as needed for wheezing or shortness of breath., Disp: 6.7 g, Rfl: 1   amitriptyline (ELAVIL) 10 MG tablet, Take 1 tablet (10 mg total) by mouth at bedtime., Disp: 90 tablet, Rfl: 1   Azelastine-Fluticasone (DYMISTA) 137-50 MCG/ACT SUSP, Place 2 sprays into both nostrils 2 (two) times daily as needed., Disp: 23 g, Rfl: 5   botulinum toxin Type A (BOTOX) 200 units injection, Provider to inject 155 units into the muscles of the head and neck every 12 weeks. Discard remainder., Disp: 1 each, Rfl: 3   CALCIUM PO, Take 1 tablet by mouth daily in the afternoon., Disp: , Rfl:    cetirizine (ZYRTEC) 10 MG tablet, Take 2 tablets (20 mg total) by mouth 2 (two) times daily.,  Disp: 360 tablet, Rfl: 1   diazepam (VALIUM) 10 MG tablet, Take 1 tablet (10 mg total) by mouth at bedtime as needed for sleep., Disp: 30 tablet, Rfl: 2   EPINEPHrine 0.3 mg/0.3 mL IJ SOAJ injection, Inject 0.3 mg into the muscle as needed for anaphylaxis., Disp: 2 each, Rfl: 1   fluconazole (DIFLUCAN) 150 MG tablet, Take 1 tablet (150 mg total) by mouth daily., Disp: 14 tablet, Rfl: 5   Fluticasone-Umeclidin-Vilant (TRELEGY ELLIPTA) 200-62.5-25 MCG/ACT AEPB, Inhale 1 puff into the lungs daily., Disp: 60 each, Rfl: 5   Galcanezumab-gnlm (EMGALITY) 120 MG/ML SOAJ, Inject 120 mg into the skin every 30 (thirty) days., Disp: 3 mL, Rfl: 3   Hypertonic Nasal Wash (SINUS RINSE REFILL) PACK, Use one packet dissolved in water as needed. (Patient taking differently: Place 1 each into the nose in the morning and at bedtime.), Disp: 100 each, Rfl: 5   ibuprofen (ADVIL) 800 MG tablet, Take 1 tablet (800 mg total) by mouth 3 (three) times daily as needed (pain.)., Disp: 270 tablet, Rfl: 0   ipratropium (ATROVENT) 0.06 % nasal spray, Place 2 sprays into both nostrils 4 (four) times daily., Disp: 45 mL, Rfl: 5   linaclotide (LINZESS) 290 MCG CAPS capsule, Take 1 capsule (290 mcg total) by mouth daily before breakfast., Disp: 90 capsule,  Rfl: 1   meloxicam (MOBIC) 15 MG tablet, Take 1 tablet (15 mg total) by mouth at bedtime as needed for pain., Disp: 90 tablet, Rfl: 3   nystatin (MYCOSTATIN) 100000 UNIT/ML suspension, Take 5 mLs (500,000 Units total) by mouth 4 (four) times daily. Gargle and spit., Disp: 473 mL, Rfl: 0   ondansetron (ZOFRAN-ODT) 4 MG disintegrating tablet, Dissolve 1-2 tablets (4-8 mg total) by mouth every 8 (eight) hours as needed. May take with Rizatriptan, Disp: 90 tablet, Rfl: 3   pantoprazole (PROTONIX) 40 MG tablet, Take 1 tablet (40 mg total) by mouth at bedtime., Disp: 90 tablet, Rfl: 3   Pediatric Multiple Vit-C-FA (MULTIVITAMIN ANIMAL SHAPES, WITH CA/FA,) with C & FA chewable tablet, Chew 2  tablets by mouth daily. "Bariactric advatage", Disp: , Rfl:    phenazopyridine (PYRIDIUM) 100 MG tablet, Take 1 tablet (100 mg total) by mouth 3 (three) times daily as needed for pain., Disp: 10 tablet, Rfl: 0   Rimegepant Sulfate (NURTEC) 75 MG TBDP, Take 1 tablet (75 mg total) by mouth daily as needed., Disp: 16 tablet, Rfl: 11   rizatriptan (MAXALT-MLT) 10 MG disintegrating tablet, Dissolve 1 tablet (10 mg total) by mouth as needed at onset of migraine. May take another tablet 2 hours later if needed, max 2 tablets in 24 hours., Disp: 27 tablet, Rfl: 3   simethicone (MYLICON) 80 MG chewable tablet, Chew 80 mg by mouth every 6 (six) hours as needed for flatulence., Disp: , Rfl:    tezepelumab-ekko (TEZSPIRE) 210 MG/1. syringe, Inject 1.91 mLs (210 mg total) into the skin every 28 (twenty-eight) days., Disp: 1.91 mL, Rfl: 11   tizanidine (ZANAFLEX) 6 MG capsule, Take 1 capsule (6 mg total) by mouth at bedtime., Disp: 90 capsule, Rfl: 3   triamcinolone ointment (KENALOG) 0.1 %, Apply topically 2 (two) times daily., Disp: , Rfl:    valACYclovir (VALTREX) 500 MG tablet, Take 2 tablets (1,000 mg total) by mouth 2 (two) times daily., Disp: 360 tablet, Rfl: 0   Vibegron (GEMTESA) 75 MG TABS, Take 1 tablet every day by oral route for 90 days., Disp: 90 tablet, Rfl: 4   risedronate (ACTONEL) 150 MG tablet, Take 1 tablet (150 mg total) by mouth every 30 (thirty) days. with water on empty stomach, nothing by mouth or lie down for next 30 minutes., Disp: 12 tablet, Rfl: 1  Current Facility-Administered Medications:    tezepelumab-ekko (TEZSPIRE) 210 MG/1. syringe 210 mg, 210 mg, Subcutaneous, Q28 days, Alfonse Spruce, MD, 210 mg at 10/09/23 1106    Labs:  Lab Results  Component Value Date   CHOL 166 04/24/2023   HDL 79.60 04/24/2023   LDLCALC 77 04/24/2023   TRIG 45.0 04/24/2023   CHOLHDL 2 04/24/2023   Lab Results  Component Value Date   HGBA1C 5.4 04/24/2023   Lab Results   Component Value Date   NA 137 04/24/2023   CL 101 04/24/2023   K 3.7 04/24/2023   CO2 30 04/24/2023   BUN 13 04/24/2023   CREATININE 0.77 05/29/2023   GFR 88.67 04/24/2023   CALCIUM 8.4 (L) 04/24/2023   CALCIUM 8.7 04/24/2023   ALBUMIN 3.9 04/24/2023   GLUCOSE 76 04/24/2023   Lifestyle & Dietary Hx  Pt presents today alone. Pt reports a previous roux-en-Y in 2016 and states she has been unable to establish with a Teacher, English as a foreign language in [place].  Pt reports she feels her muscle mass is decreasing and admits she has decreased her physical activity recently.  Pt reports she maintain 8K steps 5 days weekly and enjoys weight resistance training. Pt would like to ensure she is getting the appropriate amount of protein in her dietary intake. Pt reports she does experience constipation and recently diagnosis with osteoporosis. Pt reports she is lactose intolerant. Pt reports poor sleep and C/o menopause and hot flashes interrupting her sleep.  Pt reprtos decreasing the amount of sugar in coffee. Pt reprtos she recongnizes that she stress eats at times. 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 Pt reports low energy and low motivation. Pt reports she is taking calcium and Bari advantage MVI daily.        Estimated daily fluid intake: 32*2 oz Supplements: bariatric advantage daily multi Sleep: 5-6 hours nightly, poor  Stress / self-care: 10;  fun with partner, church, prayer Current average weekly physical activity: walks at work ~8K  steps   24-Hr Dietary Recall First Meal: 2 eggs scrambled in stick butter, 1/2 slice of Gouda and 1/2 slice of american cheese, coffee with 1/2 teaspoon sugar with white mocha creamer  Snack: 2 slices of ham Second Meal: 1-2 peanut butter crackers or leftovers or air fried breaded chicken nuggets or baked chicken, greens,   Snack: occasionally pringle's or sun  chips Third Meal: 2 deviled eggs, greens with ham, dressing/stuffing with chicken  Snack: 1-2 crackers or slice of ham  Beverages: coffee with 1/2 teaspoon, ~20 oz milos sweet tea mixed simply lemonade, water   NUTRITION DIAGNOSIS  NB-1.1 Food and nutrition-related knowledge deficit As related to limited prior nutrition related education .  As evidenced by Pt reports and dietary recall .   NUTRITION INTERVENTION  Nutrition education (E-1) on the following topics:  Fruits & Vegetables: Aim to fill half your plate with a variety of fruits and vegetables. They are rich in vitamins, minerals, and fiber, and can help reduce the risk of chronic diseases. Choose a colorful assortment of fruits and vegetables to ensure you get a wide range of nutrients. Grains and Starches: Make at least half of your grain choices whole grains, such as brown rice, whole wheat bread, and oats. Whole grains provide fiber, which aids in digestion and healthy cholesterol levels. Aim for whole forms of starchy vegetables such as potatoes, sweet potatoes, beans, peas, and corn, which are fiber rich and provide many vitamins and minerals.  Protein: Incorporate lean sources of protein, such as poultry, fish, beans, nuts, and seeds, into your meals. Protein is essential for building and repairing tissues, staying full, balancing blood sugar, as well as supporting immune function. Dairy: Include low-fat or fat-free dairy products like milk, yogurt, and cheese in your diet. Dairy foods are excellent sources of calcium and vitamin D, which are crucial for bone health.  Physical Activity: Aim for 60 minutes of physical activity daily. Regular physical activity promotes overall health-including helping to reduce risk for heart disease and diabetes, promoting mental health, and helping [place] sleep better.   Handouts Provided Include  Bariatric Wellness Maintenance Plan 1 Year Calcium  what does it do? Snack List   Learning Style &  Readiness for Change Teaching method utilized: Visual & Auditory  Demonstrated degree of understanding via: Teach Back  Barriers to learning/adherence to lifestyle change: time management   Goals Established by Pt Check out the gym at [facility]  Plate Planner    MONITORING & EVALUATION Dietary  intake, weekly physical activity  Next Steps  Patient is to return.

## 2023-11-12 ENCOUNTER — Other Ambulatory Visit: Payer: Self-pay | Admitting: Allergy & Immunology

## 2023-11-12 ENCOUNTER — Other Ambulatory Visit: Payer: Self-pay | Admitting: Family Medicine

## 2023-11-12 ENCOUNTER — Other Ambulatory Visit: Payer: Self-pay | Admitting: Adult Health

## 2023-11-12 ENCOUNTER — Other Ambulatory Visit: Payer: Self-pay

## 2023-11-12 ENCOUNTER — Ambulatory Visit: Payer: 59 | Admitting: *Deleted

## 2023-11-12 DIAGNOSIS — J4550 Severe persistent asthma, uncomplicated: Secondary | ICD-10-CM | POA: Diagnosis not present

## 2023-11-12 DIAGNOSIS — R300 Dysuria: Secondary | ICD-10-CM

## 2023-11-12 DIAGNOSIS — J4541 Moderate persistent asthma with (acute) exacerbation: Secondary | ICD-10-CM

## 2023-11-12 DIAGNOSIS — G43919 Migraine, unspecified, intractable, without status migrainosus: Secondary | ICD-10-CM

## 2023-11-12 MED ORDER — TRELEGY ELLIPTA 200-62.5-25 MCG/ACT IN AEPB
1.0000 | INHALATION_SPRAY | Freq: Every day | RESPIRATORY_TRACT | 1 refills | Status: DC
  Filled 2023-11-12: qty 180, 90d supply, fill #0
  Filled 2024-06-17 (×2): qty 180, 90d supply, fill #1

## 2023-11-12 NOTE — Progress Notes (Signed)
Aleen Sells D.[name] [address] [place] [address] Phone: [phone]   Assessment and Plan:     1. Chronic bilateral low back pain without sciatica 2. Neck pain 3. Somatic dysfunction of cervical region 4. Somatic dysfunction of thoracic region 5. Somatic dysfunction of lumbar region 6. Somatic dysfunction of pelvic region 7. Somatic dysfunction of rib region -Chronic with exacerbation, subsequent visit - Recurrence of multiple musculoskeletal concerns with most prominent being neck, low back.  - Patient has received relief with OMT in the past.  Elects for repeat OMT today.  Tolerated well per note below. - Decision today to treat with OMT was based on Physical Exam   After verbal consent patient was treated with HVLA (high velocity low amplitude), ME (muscle energy), FPR (flex positional release), ST (soft tissue), PC/PD (Pelvic Compression/ Pelvic Decompression) techniques in cervical, rib, thoracic, lumbar, and pelvic areas. Patient tolerated the procedure well with improvement in symptoms.  Patient educated on potential side effects of soreness and recommended to rest, hydrate, and use Tylenol as needed for pain control.   Pertinent previous records reviewed include none  Follow Up: 2 months for reevaluation.  Patient is traveling to [place] to help care for her mother and will be out of state for the next 2 months.   Subjective:   I, [name], am serving as a Neurosurgeon for [employer]  Chief Complaint: neck pain    HPI:    08/07/2023 Patient states her low back and neck are flared and here for adjustment    09/04/2023 Patient states that her upper trap is tight , right shoulder tightness and pain, low back flare of tightness she isnt able to stand up straight    10/09/2023 Patient states that she is tight. Has been trying to do some stretches.    10/16/2023 Patient states neck  and shoulder flare    11/13/2023 Patient states thoracic and cervical tightness/stiffness     Relevant Historical Information: Migraines, GERD, history of Roux-en-Y gastric bypass  Additional pertinent review of systems negative.  Current Outpatient Medications  Medication Sig Dispense Refill   acetaminophen (TYLENOL) 500 MG tablet Take by mouth.     acyclovir ointment (ZOVIRAX) 5 % Apply onto the affected area every 3 hours. 15 g 1   albuterol (VENTOLIN HFA) 108 (90 Base) MCG/ACT inhaler Inhale 1 - 2 puffs into the lungs every 6 hours as needed for wheezing or shortness of breath. 6.7 g 1   amitriptyline (ELAVIL) 10 MG tablet Take 1 tablet (10 mg total) by mouth at bedtime. 90 tablet 1   Azelastine-Fluticasone (DYMISTA) 137-50 MCG/ACT SUSP Place 2 sprays into both nostrils 2 (two) times daily as needed. 23 g 5   botulinum toxin Type A (BOTOX) 200 units injection Provider to inject 155 units into the muscles of the head and neck every 12 weeks. Discard remainder. 1 each 3   CALCIUM PO Take 1 tablet by mouth daily in the afternoon.     cetirizine (ZYRTEC) 10 MG tablet Take 2 tablets (20 mg total) by mouth 2 (two) times daily. 360 tablet 1   diazepam (VALIUM) 10 MG tablet Take 1 tablet (10 mg total) by mouth at bedtime as needed for sleep. 30 tablet 2   EPINEPHrine 0.3 mg/0.3 mL IJ SOAJ injection Inject 0.3 mg into the muscle as needed for anaphylaxis. 2 each 1   fluconazole (DIFLUCAN) 150 MG tablet Take 1 tablet (150 mg total) by  mouth daily. 14 tablet 5   Fluticasone-Umeclidin-Vilant (TRELEGY ELLIPTA) 200-62.5-25 MCG/ACT AEPB Inhale 1 puff into the lungs daily. 180 each 1   Galcanezumab-gnlm (EMGALITY) 120 MG/ML SOAJ Inject 120 mg into the skin every 30 (thirty) days. 3 mL 3   Hypertonic Nasal Wash (SINUS RINSE REFILL) PACK Use one packet dissolved in water as needed. (Patient taking differently: Place 1 each into the nose in the morning and at bedtime.) 100 each 5   ibuprofen (ADVIL) 800 MG tablet Take 1 tablet  (800 mg total) by mouth 3 (three) times daily as needed (pain.). 270 tablet 0   ipratropium (ATROVENT) 0.06 % nasal spray Place 2 sprays into both nostrils 4 (four) times daily. 45 mL 5   linaclotide (LINZESS) 290 MCG CAPS capsule Take 1 capsule (290 mcg total) by mouth daily before breakfast. 90 capsule 1   meloxicam (MOBIC) 15 MG tablet Take 1 tablet (15 mg total) by mouth at bedtime as needed for pain. 90 tablet 3   nystatin (MYCOSTATIN) 100000 UNIT/ML suspension Take 5 mLs (500,000 Units total) by mouth 4 (four) times daily. Gargle and spit. 473 mL 0   ondansetron (ZOFRAN-ODT) 4 MG disintegrating tablet Dissolve 1-2 tablets (4-8 mg total) by mouth every 8 (eight) hours as needed. May take with Rizatriptan 90 tablet 3   pantoprazole (PROTONIX) 40 MG tablet Take 1 tablet (40 mg total) by mouth at bedtime. 90 tablet 3   Pediatric Multiple Vit-C-FA (MULTIVITAMIN ANIMAL SHAPES, WITH CA/FA,) with C & FA chewable tablet Chew 2 tablets by mouth daily. "Bariactric advatage"     phenazopyridine (PYRIDIUM) 100 MG tablet Take 1 tablet (100 mg total) by mouth 3 (three) times daily as needed for pain. 10 tablet 0   Rimegepant Sulfate (NURTEC) 75 MG TBDP Take 1 tablet (75 mg total) by mouth daily as needed. 16 tablet 11   risedronate (ACTONEL) 150 MG tablet Take 1 tablet (150 mg total) by mouth every 30 (thirty) days. with water on empty stomach, nothing by mouth or lie down for next 30 minutes. 12 tablet 1   rizatriptan (MAXALT-MLT) 10 MG disintegrating tablet Dissolve 1 tablet (10 mg total) by mouth as needed at onset of migraine. May take another tablet 2 hours later if needed, max 2 tablets in 24 hours. 27 tablet 3   simethicone (MYLICON) 80 MG chewable tablet Chew 80 mg by mouth every 6 (six) hours as needed for flatulence.     tezepelumab-ekko (TEZSPIRE) 210 MG/1. syringe Inject 1.91 mLs (210 mg total) into the skin every 28 (twenty-eight) days. 1.91 mL 11   tizanidine (ZANAFLEX) 6 MG capsule Take 1  capsule (6 mg total) by mouth at bedtime. 90 capsule 3   triamcinolone ointment (KENALOG) 0.1 % Apply topically 2 (two) times daily.     valACYclovir (VALTREX) 500 MG tablet Take 2 tablets (1,000 mg total) by mouth 2 (two) times daily. 360 tablet 0   Vibegron (GEMTESA) 75 MG TABS Take 1 tablet every day by oral route for 90 days. 90 tablet 4   Current Facility-Administered Medications  Medication Dose Route Frequency Provider Last Rate Last Admin   tezepelumab-ekko (TEZSPIRE) 210 MG/1. syringe 210 mg  210 mg Subcutaneous Q28 days Alfonse Spruce, MD   210 mg at 11/12/23 1746      Objective:     Vitals:   11/13/23 1002  BP: 108/80  Pulse: (!) 107  SpO2: 100%  Weight: 181 lb (82.1 kg)  Height: 5\' 6"  (1.676 m)  Body mass index is 29.21 kg/m.    Physical Exam:     General: Well-appearing, cooperative, sitting comfortably in no acute distress.   OMT Physical Exam:  ASIS Compression Test: Positive Right Cervical: TTP paraspinal, C3 RR SL, C5-7 RRSL Rib: Bilateral elevated first rib with TTP Thoracic: TTP paraspinal, T4-6 RRSL, T7-9 RLSR Lumbar: TTP paraspinal, L2 RLSL Pelvis: Right anterior innominate  Electronically signed by:  Aleen Sells D.[name] 10:28 AM 11/13/23

## 2023-11-13 ENCOUNTER — Other Ambulatory Visit (HOSPITAL_COMMUNITY): Payer: Self-pay

## 2023-11-13 ENCOUNTER — Other Ambulatory Visit: Payer: Self-pay

## 2023-11-13 ENCOUNTER — Ambulatory Visit: Payer: 59

## 2023-11-13 ENCOUNTER — Ambulatory Visit (INDEPENDENT_AMBULATORY_CARE_PROVIDER_SITE_OTHER): Payer: 59 | Admitting: Sports Medicine

## 2023-11-13 VITALS — BP 108/80 | HR 107 | Ht 66.0 in | Wt 181.0 lb

## 2023-11-13 DIAGNOSIS — M9901 Segmental and somatic dysfunction of cervical region: Secondary | ICD-10-CM | POA: Diagnosis not present

## 2023-11-13 DIAGNOSIS — M542 Cervicalgia: Secondary | ICD-10-CM | POA: Diagnosis not present

## 2023-11-13 DIAGNOSIS — M9903 Segmental and somatic dysfunction of lumbar region: Secondary | ICD-10-CM

## 2023-11-13 DIAGNOSIS — M9908 Segmental and somatic dysfunction of rib cage: Secondary | ICD-10-CM

## 2023-11-13 DIAGNOSIS — G8929 Other chronic pain: Secondary | ICD-10-CM

## 2023-11-13 DIAGNOSIS — M545 Low back pain, unspecified: Secondary | ICD-10-CM | POA: Diagnosis not present

## 2023-11-13 DIAGNOSIS — M9905 Segmental and somatic dysfunction of pelvic region: Secondary | ICD-10-CM | POA: Diagnosis not present

## 2023-11-13 DIAGNOSIS — M9902 Segmental and somatic dysfunction of thoracic region: Secondary | ICD-10-CM

## 2023-11-13 MED ORDER — ACYCLOVIR 5 % EX OINT
1.0000 | TOPICAL_OINTMENT | CUTANEOUS | 1 refills | Status: DC
  Filled 2023-11-13: qty 15, 2d supply, fill #0
  Filled 2024-03-02: qty 15, 2d supply, fill #1

## 2023-11-14 ENCOUNTER — Other Ambulatory Visit (HOSPITAL_COMMUNITY): Payer: Self-pay

## 2023-11-14 ENCOUNTER — Other Ambulatory Visit: Payer: Self-pay | Admitting: Allergy & Immunology

## 2023-11-16 ENCOUNTER — Other Ambulatory Visit: Payer: Self-pay

## 2023-11-16 ENCOUNTER — Other Ambulatory Visit (HOSPITAL_COMMUNITY): Payer: Self-pay

## 2023-11-16 MED ORDER — ALBUTEROL SULFATE HFA 108 (90 BASE) MCG/ACT IN AERS
1.0000 | INHALATION_SPRAY | Freq: Four times a day (QID) | RESPIRATORY_TRACT | 0 refills | Status: DC | PRN
  Filled 2023-11-16: qty 36, 135d supply, fill #0
  Filled 2023-11-23 – 2024-03-02 (×2): qty 20.1, 75d supply, fill #0

## 2023-11-17 ENCOUNTER — Other Ambulatory Visit: Payer: Self-pay

## 2023-11-17 ENCOUNTER — Other Ambulatory Visit (HOSPITAL_COMMUNITY): Payer: Self-pay

## 2023-11-17 MED ORDER — TRIAMCINOLONE ACETONIDE 0.1 % EX OINT
TOPICAL_OINTMENT | Freq: Two times a day (BID) | CUTANEOUS | 0 refills | Status: DC
  Filled 2023-11-17: qty 90, fill #0
  Filled 2023-11-23: qty 30, 30d supply, fill #0
  Filled 2023-11-25: qty 60, 60d supply, fill #0

## 2023-11-18 ENCOUNTER — Other Ambulatory Visit: Payer: Self-pay

## 2023-11-19 ENCOUNTER — Other Ambulatory Visit (HOSPITAL_COMMUNITY): Payer: Self-pay

## 2023-11-20 ENCOUNTER — Telehealth: Payer: Self-pay

## 2023-11-20 NOTE — Telephone Encounter (Signed)
Prior Authorization initiated for RESIDRONATE via [email or web address] KEY: [id number]

## 2023-11-20 NOTE — Telephone Encounter (Signed)
Prior Authorization initiated for [facility] via [email or web address] KEY: [id number]

## 2023-11-23 ENCOUNTER — Other Ambulatory Visit (HOSPITAL_COMMUNITY): Payer: Self-pay

## 2023-11-23 ENCOUNTER — Other Ambulatory Visit: Payer: Self-pay | Admitting: Adult Health

## 2023-11-23 DIAGNOSIS — G43919 Migraine, unspecified, intractable, without status migrainosus: Secondary | ICD-10-CM

## 2023-11-23 NOTE — Telephone Encounter (Signed)
Pending

## 2023-11-24 ENCOUNTER — Other Ambulatory Visit: Payer: Self-pay

## 2023-11-24 ENCOUNTER — Encounter (HOSPITAL_COMMUNITY): Payer: Self-pay

## 2023-11-24 ENCOUNTER — Other Ambulatory Visit (HOSPITAL_BASED_OUTPATIENT_CLINIC_OR_DEPARTMENT_OTHER): Payer: Self-pay

## 2023-11-24 ENCOUNTER — Other Ambulatory Visit (HOSPITAL_COMMUNITY): Payer: Self-pay

## 2023-11-24 NOTE — Progress Notes (Signed)
Specialty Pharmacy Ongoing Clinical Assessment Note  Donna Park is a 52 y.o. female who is being followed by the specialty pharmacy service for RxSp Asthma/COPD   Patient's specialty medication(s) reviewed today: Tezepelumab-ekko (Tezspire)   Missed doses in the last 4 weeks: 0   Patient/Caregiver did not have any additional questions or concerns.   Therapeutic benefit summary: Patient is achieving benefit   Adverse events/side effects summary: No adverse events/side effects   Patient's therapy is appropriate to: Continue    Goals Addressed             This Visit's Progress    Minimize recurrence of flares       Patient is on track. Patient will maintain adherence.  Patient reports that she is very well-controlled at this time.          Follow up:  6 months  Servando Snare Specialty Pharmacist

## 2023-11-25 ENCOUNTER — Other Ambulatory Visit: Payer: Self-pay

## 2023-11-25 ENCOUNTER — Other Ambulatory Visit (HOSPITAL_COMMUNITY): Payer: Self-pay

## 2023-11-25 ENCOUNTER — Encounter: Payer: 59 | Attending: Adult Health | Admitting: Dietician

## 2023-11-25 DIAGNOSIS — Z9884 Bariatric surgery status: Secondary | ICD-10-CM | POA: Insufficient documentation

## 2023-11-25 MED ORDER — IBUPROFEN 800 MG PO TABS
800.0000 mg | ORAL_TABLET | Freq: Three times a day (TID) | ORAL | 0 refills | Status: DC | PRN
  Filled 2023-11-25 – 2024-03-02 (×2): qty 270, 90d supply, fill #0

## 2023-11-25 NOTE — Telephone Encounter (Signed)
Denied on December 17 by [employer] [email or web address]  The request has been partially denied. The authorization is effective from 11/24/2023 to 11/22/2024, as long as the member is enrolled in their current health plan. We were asked to cover a larger amount of the drug listed at the top of this letter than allowed under our quantity limit rule. Your provider requested a quantity of twelve (12) risedronate 150mg  tablets per 84-day supply, but your plan allows for a quantity of one (1) risedronate 150mg  tablets per 30-day supply for the treatment of age-related osteoporosis (a type of bone condition). The requested dose of twelve (12) risedronate 150mg  tablets per 84-day supply is higher than the Food and Drug Administration (FDA) or drug manufacturer daily maximum recommendations for risedronate 150mg  tablets for the treatment of age related osteoporosis, which is 150mg  per month. You were approved for a lower quantity than requested based on our guideline named GENERAL QUANTITY EXCEPTION CRITERIA (reviewed for risedronate 150mg  tablets). In order for this request to be approved at the quantity requested, your provider would need to show that you meet ALL of the following guideline rules: 1) Your provider has provided documentation that supports the safety and efficacy of the requested drug at the intended dosage for the specified indication by approved compendia (medical references such as Clinical Pharmacology, [employer] Micromedex DrugDex, and [employer] Lexi-Drugs), guidelines issued by leading nationally-recognized associations and agencies (such as the [employer] of Gastroenterology or the [employer]) or found in at least two (2) high-quality articles from major peer-reviewed medical journals (such as Lancet or The [place] Journal of Medicine).2) You meet ONE of the following: A) You are titrating the daily dosage of the medication up (you are slowly increasing the  dose of your medication over time) to a dose that is within the Food and Drug Administration (F

## 2023-11-25 NOTE — Progress Notes (Unsigned)
Donna Park D.[facility] [address] [place] [address] Phone: [phone]   Assessment and Plan:     There are no diagnoses linked to this encounter.  ***   Pertinent previous records reviewed include ***    Follow Up: ***     Subjective:   I, Donna Park, am serving as a Neurosurgeon for Doctor [employer]  Chief Complaint: neck pain    HPI:    08/07/2023 Patient states her low back and neck are flared and here for adjustment    09/04/2023 Patient states that her upper trap is tight , right shoulder tightness and pain, low back flare of tightness she isnt able to stand up straight    10/09/2023 Patient states that she is tight. Has been trying to do some stretches.    10/16/2023 Patient states neck  and shoulder flare    11/13/2023 Patient states thoracic and cervical tightness/stiffness    11/26/2023 Patient states   Relevant Historical Information: Migraines, GERD, history of Roux-en-Y gastric bypass  Additional pertinent review of systems negative.   Current Outpatient Medications:    acetaminophen (TYLENOL) 500 MG tablet, Take by mouth., Disp: , Rfl:    acyclovir ointment (ZOVIRAX) 5 %, Apply onto the affected area every 3 hours., Disp: 15 g, Rfl: 1   albuterol (VENTOLIN HFA) 108 (90 Base) MCG/ACT inhaler, Inhale 1 - 2 puffs into the lungs every 6 hours as needed for wheezing or shortness of breath., Disp: 20.1 g, Rfl: 0   amitriptyline (ELAVIL) 10 MG tablet, Take 1 tablet (10 mg total) by mouth at bedtime., Disp: 90 tablet, Rfl: 1   Azelastine-Fluticasone (DYMISTA) 137-50 MCG/ACT SUSP, Place 2 sprays into both nostrils 2 (two) times daily as needed., Disp: 23 g, Rfl: 5   botulinum toxin Type A (BOTOX) 200 units injection, Provider to inject 155 units into the muscles of the head and neck every 12 weeks. Discard remainder., Disp: 1 each, Rfl: 3   CALCIUM PO, Take 1 tablet by mouth daily in the afternoon., Disp: ,  Rfl:    cetirizine (ZYRTEC) 10 MG tablet, Take 2 tablets (20 mg total) by mouth 2 (two) times daily., Disp: 360 tablet, Rfl: 1   diazepam (VALIUM) 10 MG tablet, Take 1 tablet (10 mg total) by mouth at bedtime as needed for sleep., Disp: 30 tablet, Rfl: 2   EPINEPHrine 0.3 mg/0.3 mL IJ SOAJ injection, Inject 0.3 mg into the muscle as needed for anaphylaxis., Disp: 2 each, Rfl: 1   fluconazole (DIFLUCAN) 150 MG tablet, Take 1 tablet (150 mg total) by mouth daily., Disp: 14 tablet, Rfl: 5   Fluticasone-Umeclidin-Vilant (TRELEGY ELLIPTA) 200-62.5-25 MCG/ACT AEPB, Inhale 1 puff into the lungs daily., Disp: 180 each, Rfl: 1   Galcanezumab-gnlm (EMGALITY) 120 MG/ML SOAJ, Inject 120 mg into the skin every 30 (thirty) days., Disp: 3 mL, Rfl: 3   Hypertonic Nasal Wash (SINUS RINSE REFILL) PACK, Use one packet dissolved in water as needed. (Patient taking differently: Place 1 each into the nose in the morning and at bedtime.), Disp: 100 each, Rfl: 5   ibuprofen (ADVIL) 800 MG tablet, Take 1 tablet (800 mg total) by mouth 3 (three) times daily as needed (pain.)., Disp: 270 tablet, Rfl: 0   ipratropium (ATROVENT) 0.06 % nasal spray, Place 2 sprays into both nostrils 4 (four) times daily., Disp: 45 mL, Rfl: 5   linaclotide (LINZESS) 290 MCG CAPS capsule, Take 1 capsule (290 mcg total) by mouth  daily before breakfast., Disp: 90 capsule, Rfl: 1   meloxicam (MOBIC) 15 MG tablet, Take 1 tablet (15 mg total) by mouth at bedtime as needed for pain., Disp: 90 tablet, Rfl: 3   nystatin (MYCOSTATIN) 100000 UNIT/ML suspension, Take 5 mLs (500,000 Units total) by mouth 4 (four) times daily. Gargle and spit., Disp: 473 mL, Rfl: 0   ondansetron (ZOFRAN-ODT) 4 MG disintegrating tablet, Dissolve 1-2 tablets (4-8 mg total) by mouth every 8 (eight) hours as needed. May take with Rizatriptan, Disp: 90 tablet, Rfl: 3   pantoprazole (PROTONIX) 40 MG tablet, Take 1 tablet (40 mg total) by mouth at bedtime., Disp: 90 tablet, Rfl: 3    Pediatric Multiple Vit-C-FA (MULTIVITAMIN ANIMAL SHAPES, WITH CA/FA,) with C & FA chewable tablet, Chew 2 tablets by mouth daily. "Bariactric advatage", Disp: , Rfl:    phenazopyridine (PYRIDIUM) 100 MG tablet, Take 1 tablet (100 mg total) by mouth 3 (three) times daily as needed for pain., Disp: 10 tablet, Rfl: 0   Rimegepant Sulfate (NURTEC) 75 MG TBDP, Take 1 tablet (75 mg total) by mouth daily as needed., Disp: 16 tablet, Rfl: 11   risedronate (ACTONEL) 150 MG tablet, Take 1 tablet (150 mg total) by mouth every 30 (thirty) days. with water on empty stomach, nothing by mouth or lie down for next 30 minutes., Disp: 12 tablet, Rfl: 1   rizatriptan (MAXALT-MLT) 10 MG disintegrating tablet, Dissolve 1 tablet (10 mg total) by mouth as needed at onset of migraine. May take another tablet 2 hours later if needed, max 2 tablets in 24 hours., Disp: 27 tablet, Rfl: 3   simethicone (MYLICON) 80 MG chewable tablet, Chew 80 mg by mouth every 6 (six) hours as needed for flatulence., Disp: , Rfl:    tezepelumab-ekko (TEZSPIRE) 210 MG/1. syringe, Inject 1.91 mLs (210 mg total) into the skin every 28 (twenty-eight) days., Disp: 1.91 mL, Rfl: 11   tizanidine (ZANAFLEX) 6 MG capsule, Take 1 capsule (6 mg total) by mouth at bedtime., Disp: 90 capsule, Rfl: 3   triamcinolone ointment (KENALOG) 0.1 %, Apply topically 2 (two) times daily., Disp: 90 g, Rfl: 0   valACYclovir (VALTREX) 500 MG tablet, Take 2 tablets (1,000 mg total) by mouth 2 (two) times daily., Disp: 360 tablet, Rfl: 0   Vibegron (GEMTESA) 75 MG TABS, Take 1 tablet every day by oral route for 90 days., Disp: 90 tablet, Rfl: 4  Current Facility-Administered Medications:    tezepelumab-ekko (TEZSPIRE) 210 MG/1. syringe 210 mg, 210 mg, Subcutaneous, Q28 days, Alfonse Spruce, MD, 210 mg at 11/12/23 1746   Objective:     There were no vitals filed for this visit.    There is no height or weight on file to calculate BMI.    Physical Exam:     ***   Electronically signed by:  Donna Park [facility] 7:43 AM 11/25/23

## 2023-11-25 NOTE — Telephone Encounter (Signed)
Okay for refill?  

## 2023-11-25 NOTE — Progress Notes (Signed)
Medical Nutrition Therapy  Appointment Start time:  0800 Appointment End time:  0830  Primary concerns today: previous roux-en-Y in 2016 and adequate protein intake, osteoporosis, constipation   Referral diagnosis: none employee wellness visit 2 Preferred learning style: no preference indicated Learning readiness: maintenance phase    NUTRITION ASSESSMENT   Clinical Medical Hx:  Past Medical History:  Diagnosis Date   Allergy    Anxiety    Arthritis    Asthma    Chicken pox    Complication of anesthesia    slow to wake up from anesthesia   Depression    DJD (degenerative joint disease)    GERD (gastroesophageal reflux disease)    resolved after gastric surgery   H/O gastric bypass 2016   History of iron deficiency anemia    HSV infection    Migraines    Obese    Pneumonia    Childhood history   PONV (postoperative nausea and vomiting)    Status post dilation of esophageal narrowing    Varicose vein of leg     Medications:  Current Outpatient Medications:    acetaminophen (TYLENOL) 500 MG tablet, Take by mouth., Disp: , Rfl:    acyclovir ointment (ZOVIRAX) 5 %, Apply onto the affected area every 3 hours., Disp: 15 g, Rfl: 1   albuterol (VENTOLIN HFA) 108 (90 Base) MCG/ACT inhaler, Inhale 1 - 2 puffs into the lungs every 6 hours as needed for wheezing or shortness of breath., Disp: 20.1 g, Rfl: 0   amitriptyline (ELAVIL) 10 MG tablet, Take 1 tablet (10 mg total) by mouth at bedtime., Disp: 90 tablet, Rfl: 1   Azelastine-Fluticasone (DYMISTA) 137-50 MCG/ACT SUSP, Place 2 sprays into both nostrils 2 (two) times daily as needed., Disp: 23 g, Rfl: 5   botulinum toxin Type A (BOTOX) 200 units injection, Provider to inject 155 units into the muscles of the head and neck every 12 weeks. Discard remainder., Disp: 1 each, Rfl: 3   CALCIUM PO, Take 1 tablet by mouth daily in the afternoon., Disp: , Rfl:    cetirizine (ZYRTEC) 10 MG tablet, Take 2 tablets (20 mg total) by mouth 2  (two) times daily., Disp: 360 tablet, Rfl: 1   diazepam (VALIUM) 10 MG tablet, Take 1 tablet (10 mg total) by mouth at bedtime as needed for sleep., Disp: 30 tablet, Rfl: 2   EPINEPHrine 0.3 mg/0.3 mL IJ SOAJ injection, Inject 0.3 mg into the muscle as needed for anaphylaxis., Disp: 2 each, Rfl: 1   fluconazole (DIFLUCAN) 150 MG tablet, Take 1 tablet (150 mg total) by mouth daily., Disp: 14 tablet, Rfl: 5   Fluticasone-Umeclidin-Vilant (TRELEGY ELLIPTA) 200-62.5-25 MCG/ACT AEPB, Inhale 1 puff into the lungs daily., Disp: 180 each, Rfl: 1   Galcanezumab-gnlm (EMGALITY) 120 MG/ML SOAJ, Inject 120 mg into the skin every 30 (thirty) days., Disp: 3 mL, Rfl: 3   Hypertonic Nasal Wash (SINUS RINSE REFILL) PACK, Use one packet dissolved in water as needed. (Patient taking differently: Place 1 each into the nose in the morning and at bedtime.), Disp: 100 each, Rfl: 5   ibuprofen (ADVIL) 800 MG tablet, Take 1 tablet (800 mg total) by mouth 3 (three) times daily as needed (pain.)., Disp: 270 tablet, Rfl: 0   ipratropium (ATROVENT) 0.06 % nasal spray, Place 2 sprays into both nostrils 4 (four) times daily., Disp: 45 mL, Rfl: 5   linaclotide (LINZESS) 290 MCG CAPS capsule, Take 1 capsule (290 mcg total) by mouth daily before breakfast., Disp:  90 capsule, Rfl: 1   meloxicam (MOBIC) 15 MG tablet, Take 1 tablet (15 mg total) by mouth at bedtime as needed for pain., Disp: 90 tablet, Rfl: 3   nystatin (MYCOSTATIN) 100000 UNIT/ML suspension, Take 5 mLs (500,000 Units total) by mouth 4 (four) times daily. Gargle and spit., Disp: 473 mL, Rfl: 0   ondansetron (ZOFRAN-ODT) 4 MG disintegrating tablet, Dissolve 1-2 tablets (4-8 mg total) by mouth every 8 (eight) hours as needed. May take with Rizatriptan, Disp: 90 tablet, Rfl: 3   pantoprazole (PROTONIX) 40 MG tablet, Take 1 tablet (40 mg total) by mouth at bedtime., Disp: 90 tablet, Rfl: 3   Pediatric Multiple Vit-C-FA (MULTIVITAMIN ANIMAL SHAPES, WITH CA/FA,) with C & FA  chewable tablet, Chew 2 tablets by mouth daily. "Bariactric advatage", Disp: , Rfl:    phenazopyridine (PYRIDIUM) 100 MG tablet, Take 1 tablet (100 mg total) by mouth 3 (three) times daily as needed for pain., Disp: 10 tablet, Rfl: 0   Rimegepant Sulfate (NURTEC) 75 MG TBDP, Take 1 tablet (75 mg total) by mouth daily as needed., Disp: 16 tablet, Rfl: 11   risedronate (ACTONEL) 150 MG tablet, Take 1 tablet (150 mg total) by mouth every 30 (thirty) days. with water on empty stomach, nothing by mouth or lie down for next 30 minutes., Disp: 12 tablet, Rfl: 1   rizatriptan (MAXALT-MLT) 10 MG disintegrating tablet, Dissolve 1 tablet (10 mg total) by mouth as needed at onset of migraine. May take another tablet 2 hours later if needed, max 2 tablets in 24 hours., Disp: 27 tablet, Rfl: 3   simethicone (MYLICON) 80 MG chewable tablet, Chew 80 mg by mouth every 6 (six) hours as needed for flatulence., Disp: , Rfl:    tezepelumab-ekko (TEZSPIRE) 210 MG/1. syringe, Inject 1.91 mLs (210 mg total) into the skin every 28 (twenty-eight) days., Disp: 1.91 mL, Rfl: 11   tizanidine (ZANAFLEX) 6 MG capsule, Take 1 capsule (6 mg total) by mouth at bedtime., Disp: 90 capsule, Rfl: 3   triamcinolone ointment (KENALOG) 0.1 %, Apply topically 2 (two) times daily., Disp: 90 g, Rfl: 0   valACYclovir (VALTREX) 500 MG tablet, Take 2 tablets (1,000 mg total) by mouth 2 (two) times daily., Disp: 360 tablet, Rfl: 0   Vibegron (GEMTESA) 75 MG TABS, Take 1 tablet every day by oral route for 90 days., Disp: 90 tablet, Rfl: 4  Current Facility-Administered Medications:    tezepelumab-ekko (TEZSPIRE) 210 MG/1. syringe 210 mg, 210 mg, Subcutaneous, Q28 days, Alfonse Spruce, MD, 210 mg at 11/12/23 1746    Labs:  Lab Results  Component Value Date   CHOL 166 04/24/2023   HDL 79.60 04/24/2023   LDLCALC 77 04/24/2023   TRIG 45.0 04/24/2023   CHOLHDL 2 04/24/2023   Lab Results  Component Value Date   HGBA1C 5.4  04/24/2023   Lab Results  Component Value Date   NA 137 04/24/2023   CL 101 04/24/2023   K 3.7 04/24/2023   CO2 30 04/24/2023   BUN 13 04/24/2023   CREATININE 0.77 05/29/2023   GFR 88.67 04/24/2023   CALCIUM 8.4 (L) 04/24/2023   CALCIUM 8.7 04/24/2023   ALBUMIN 3.9 04/24/2023   GLUCOSE 76 04/24/2023   Lifestyle & Dietary Hx  Pt presents today alone. Pt reports a previous roux-en-Y in 2016. Pt reports her care giving responsibilities with her mother have interfered with her goals. Pt reports the plate planner is going better and she is aiming for balanced meals. Pt reports  low energy is improving slightly. Pt reports she is maintaining 8K steps daily and states still in progress with [place] gym membership. Pt reports she continues to select water and is decreasing intake of sugar sweetened beverages successfully. Pt reports taking linzess once to twice weekly for constipation. Pt reports sleep improving. Pt feels she is getting appropriate amount of protein in her dietary intake. Pt reports she is taking [place] advantage MVI daily and viactiv, vitamin D 5K IU and B12 daily.        Estimated daily fluid intake: 32*2 oz Supplements: bariatric advantage daily multi Sleep: 5-6 hours nightly, poor  Stress / self-care: 10;  fun with partner, church, prayer Current average weekly physical activity: walks at work ~8K  steps     NUTRITION DIAGNOSIS  NB-1.1 Food and nutrition-related knowledge deficit As related to limited prior nutrition related education .  As evidenced by Pt reports and dietary recall .   NUTRITION INTERVENTION  Nutrition education (E-1) on the following topics:  Fruits & Vegetables: Aim to fill half your plate with a variety of fruits and vegetables. They are rich in vitamins, minerals, and fiber, and can help reduce the risk of chronic diseases. Choose a colorful assortment of fruits and vegetables to ensure you get a wide range of nutrients. Grains and Starches: Make  at least half of your grain choices whole grains, such as brown rice, whole wheat bread, and oats. Whole grains provide fiber, which aids in digestion and healthy cholesterol levels. Aim for whole forms of starchy vegetables such as potatoes, sweet potatoes, beans, peas, and corn, which are fiber rich and provide many vitamins and minerals.  Protein: Incorporate lean sources of protein, such as poultry, fish, beans, nuts, and seeds, into your meals. Protein is essential for building and repairing tissues, staying full, balancing blood sugar, as well as supporting immune function. Dairy: Include low-fat or fat-free dairy products like milk, yogurt, and cheese in your diet. Dairy foods are excellent sources of calcium and vitamin D, which are crucial for bone health.  Physical Activity: Aim for 60 minutes of physical activity daily. Regular physical activity promotes overall health-including helping to reduce risk for heart disease and diabetes, promoting mental health, and helping [place] sleep better.     Learning Style & Readiness for Change Teaching method utilized: Visual & Auditory  Demonstrated degree of understanding via: Teach Back  Barriers to learning/adherence to lifestyle change: time management   Goals Established by Pt Check out the gym at [facility]-still in progress Plate Planner  Supplement Schedule   MONITORING & EVALUATION Dietary intake, weekly physical activity  Next Steps  Patient is to return PRN.

## 2023-11-26 ENCOUNTER — Ambulatory Visit: Payer: 59 | Admitting: Sports Medicine

## 2023-11-26 ENCOUNTER — Other Ambulatory Visit: Payer: Self-pay

## 2023-11-26 ENCOUNTER — Ambulatory Visit (INDEPENDENT_AMBULATORY_CARE_PROVIDER_SITE_OTHER): Payer: 59

## 2023-11-26 VITALS — BP 118/78 | Ht 66.0 in | Wt 181.0 lb

## 2023-11-26 DIAGNOSIS — M9902 Segmental and somatic dysfunction of thoracic region: Secondary | ICD-10-CM

## 2023-11-26 DIAGNOSIS — M546 Pain in thoracic spine: Secondary | ICD-10-CM

## 2023-11-26 DIAGNOSIS — M545 Low back pain, unspecified: Secondary | ICD-10-CM

## 2023-11-26 DIAGNOSIS — M542 Cervicalgia: Secondary | ICD-10-CM

## 2023-11-26 DIAGNOSIS — M9903 Segmental and somatic dysfunction of lumbar region: Secondary | ICD-10-CM

## 2023-11-26 DIAGNOSIS — M25511 Pain in right shoulder: Secondary | ICD-10-CM

## 2023-11-26 DIAGNOSIS — G8929 Other chronic pain: Secondary | ICD-10-CM

## 2023-11-26 DIAGNOSIS — M9901 Segmental and somatic dysfunction of cervical region: Secondary | ICD-10-CM | POA: Diagnosis not present

## 2023-11-26 NOTE — Patient Instructions (Signed)
Shoulder HEP  As needed follow up

## 2023-11-27 ENCOUNTER — Other Ambulatory Visit: Payer: Self-pay

## 2023-11-27 NOTE — Progress Notes (Signed)
Specialty Pharmacy Refill Coordination Note  Donna Park is a 52 y.o. female contacted today regarding refills of specialty medication(s) [name] [name])   Patient requested Courier to Provider Office   Delivery date: 12/07/23   Verified address: [facility]  [address]   Medication will be filled on 12/04/23.

## 2023-11-30 ENCOUNTER — Other Ambulatory Visit (HOSPITAL_COMMUNITY): Payer: Self-pay

## 2023-11-30 ENCOUNTER — Other Ambulatory Visit: Payer: Self-pay | Admitting: Sports Medicine

## 2023-11-30 MED ORDER — ALENDRONATE SODIUM 70 MG PO TABS
70.0000 mg | ORAL_TABLET | ORAL | 1 refills | Status: DC
  Filled 2023-11-30: qty 12, 84d supply, fill #0

## 2023-12-19 ENCOUNTER — Encounter: Payer: Self-pay | Admitting: Allergy & Immunology

## 2023-12-19 MED ORDER — PREDNISONE 10 MG PO TABS: ORAL_TABLET | ORAL | 0 refills | Status: DC

## 2023-12-19 NOTE — Progress Notes (Signed)
 Patient called reporting that she was experiencing wheezing and coughing from a grandchild's cold. She is out of town. I confirmed a pharmacy to send in some prednisone.   Malachi Bonds, MD [facility]

## 2023-12-23 ENCOUNTER — Other Ambulatory Visit: Payer: Self-pay

## 2024-01-04 ENCOUNTER — Telehealth: Payer: Self-pay | Admitting: Neurology

## 2024-01-04 NOTE — Telephone Encounter (Signed)
Donna Park contacted me said she needs to reschedule botox please call her thank you

## 2024-01-05 NOTE — Telephone Encounter (Signed)
Pt rescheduled for Botox injection for 02/16/24 at 11:30am

## 2024-01-13 ENCOUNTER — Other Ambulatory Visit (HOSPITAL_COMMUNITY): Payer: Self-pay | Admitting: Pharmacy Technician

## 2024-01-13 ENCOUNTER — Other Ambulatory Visit: Payer: Self-pay | Admitting: Neurology

## 2024-01-13 ENCOUNTER — Other Ambulatory Visit: Payer: Self-pay

## 2024-01-13 ENCOUNTER — Other Ambulatory Visit (HOSPITAL_COMMUNITY): Payer: Self-pay

## 2024-01-13 DIAGNOSIS — G43709 Chronic migraine without aura, not intractable, without status migrainosus: Secondary | ICD-10-CM

## 2024-01-13 MED ORDER — BOTOX 200 UNITS IJ SOLR
INTRAMUSCULAR | 3 refills | Status: AC
  Filled 2024-01-13: qty 1, 84d supply, fill #0
  Filled 2024-05-09 – 2024-06-17 (×2): qty 1, 84d supply, fill #1
  Filled 2024-10-04 – 2024-10-31 (×3): qty 1, 84d supply, fill #2

## 2024-01-13 NOTE — Progress Notes (Signed)
 Specialty Pharmacy Refill Coordination Note  Donna Park is a 53 y.o. female contacted today regarding refills of specialty medication(s) OnabotulinumtoxinA  (Botox )   Patient requested Courier to Provider Office   Delivery date: 02/03/24   Verified address: [facility] [address]   Medication will be filled on 02/02/24.  RR sent to MD

## 2024-01-15 ENCOUNTER — Ambulatory Visit: Payer: 59 | Admitting: Sports Medicine

## 2024-01-15 ENCOUNTER — Ambulatory Visit: Payer: 59 | Admitting: Dietician

## 2024-01-15 ENCOUNTER — Ambulatory Visit: Payer: 59

## 2024-01-18 ENCOUNTER — Other Ambulatory Visit (HOSPITAL_COMMUNITY): Payer: Self-pay

## 2024-01-20 ENCOUNTER — Ambulatory Visit: Payer: 59 | Admitting: Neurology

## 2024-01-25 ENCOUNTER — Other Ambulatory Visit (HOSPITAL_COMMUNITY): Payer: Self-pay

## 2024-01-28 NOTE — Progress Notes (Unsigned)
Aleen Sells D.[name] [address] [place] [address] Phone: [phone]   Assessment and Plan:     There are no diagnoses linked to this encounter.  ***   Pertinent previous records reviewed include ***    Follow Up: ***     Subjective:   I, Donna Park, am serving as a Neurosurgeon for Doctor [employer]  Chief Complaint: neck pain    HPI:    08/07/2023 Patient states her low back and neck are flared and here for adjustment    09/04/2023 Patient states that her upper trap is tight , right shoulder tightness and pain, low back flare of tightness she isnt able to stand up straight    10/09/2023 Patient states that she is tight. Has been trying to do some stretches.    10/16/2023 Patient states neck  and shoulder flare    11/13/2023 Patient states thoracic and cervical tightness/stiffness     11/26/2023 Patient states anterior shoulder/ clavicle pain. Decreased ROM. Ibu doesn't help . 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 Heat helps. Pain for about 2 weeks. Would like to know if she can get a refill of fosamax    01/29/2024 Patient states   Relevant Historical Information: Migraines, GERD, history of Roux-en-Y gastric bypass  Additional pertinent review of systems negative.   Current Outpatient Medications:    alendronate (FOSAMAX) 70 MG tablet, Take 1 tablet (70 mg total) by mouth once a week. Take with a full glass of water on an empty stomach., Disp: 12 tablet, Rfl: 1   acetaminophen (TYLENOL) 500 MG tablet, Take by mouth., Disp: , Rfl:    acyclovir ointment (ZOVIRAX) 5 %, Apply onto the affected area every 3 hours., Disp: 15 g, Rfl: 1   albuterol (VENTOLIN HFA) 108 (90 Base) MCG/ACT inhaler, Inhale 1 - 2 puffs into the lungs every 6 hours as needed for wheezing or shortness of breath., Disp: 20.1 g, Rfl: 0   amitriptyline (ELAVIL) 10 MG  tablet, Take 1 tablet (10 mg total) by mouth at bedtime., Disp: 90 tablet, Rfl: 1   Azelastine-Fluticasone (DYMISTA) 137-50 MCG/ACT SUSP, Place 2 sprays into both nostrils 2 (two) times daily as needed., Disp: 23 g, Rfl: 5   botulinum toxin Type A (BOTOX) 200 units injection, Provider to inject 155 units into the muscles of the head and neck every 12 weeks. Discard remainder., Disp: 1 each, Rfl: 3   CALCIUM PO, Take 1 tablet by mouth daily in the afternoon., Disp: , Rfl:    cetirizine (ZYRTEC) 10 MG tablet, Take 2 tablets (20 mg total) by mouth 2 (two) times daily., Disp: 360 tablet, Rfl: 1   diazepam (VALIUM) 10 MG tablet, Take 1 tablet (10 mg total) by mouth at bedtime as needed for sleep., Disp: 30 tablet, Rfl: 2   EPINEPHrine 0.3 mg/0.3 mL IJ SOAJ injection, Inject 0.3 mg into the muscle as needed for anaphylaxis., Disp: 2 each, Rfl: 1   fluconazole (DIFLUCAN) 150 MG tablet, Take 1 tablet (150 mg total) by mouth daily., Disp: 14 tablet, Rfl: 5   Fluticasone-Umeclidin-Vilant (TRELEGY ELLIPTA) 200-62.5-25 MCG/ACT AEPB, Inhale 1 puff into the lungs daily., Disp: 180 each, Rfl: 1   Galcanezumab-gnlm (EMGALITY) 120 MG/ML SOAJ, Inject 120 mg into the skin every 30 (thirty) days., Disp: 3 mL, Rfl: 3   Hypertonic Nasal Wash (SINUS RINSE REFILL) PACK, Use one packet  dissolved in water as needed. (Patient taking differently: Place 1 each into the nose in the morning and at bedtime.), Disp: 100 each, Rfl: 5   ibuprofen (ADVIL) 800 MG tablet, Take 1 tablet (800 mg total) by mouth 3 (three) times daily as needed (pain.)., Disp: 270 tablet, Rfl: 0   ipratropium (ATROVENT) 0.06 % nasal spray, Place 2 sprays into both nostrils 4 (four) times daily., Disp: 45 mL, Rfl: 5   linaclotide (LINZESS) 290 MCG CAPS capsule, Take 1 capsule (290 mcg total) by mouth daily before breakfast., Disp: 90 capsule, Rfl: 1   meloxicam (MOBIC) 15 MG tablet, Take 1 tablet (15 mg total) by mouth at bedtime as needed for pain., Disp: 90  tablet, Rfl: 3   nystatin (MYCOSTATIN) 100000 UNIT/ML suspension, Take 5 mLs (500,000 Units total) by mouth 4 (four) times daily. Gargle and spit., Disp: 473 mL, Rfl: 0   ondansetron (ZOFRAN-ODT) 4 MG disintegrating tablet, Dissolve 1-2 tablets (4-8 mg total) by mouth every 8 (eight) hours as needed. May take with Rizatriptan, Disp: 90 tablet, Rfl: 3   pantoprazole (PROTONIX) 40 MG tablet, Take 1 tablet (40 mg total) by mouth at bedtime., Disp: 90 tablet, Rfl: 3   Pediatric Multiple Vit-C-FA (MULTIVITAMIN ANIMAL SHAPES, WITH CA/FA,) with C & FA chewable tablet, Chew 2 tablets by mouth daily. "Bariactric advatage", Disp: , Rfl:    phenazopyridine (PYRIDIUM) 100 MG tablet, Take 1 tablet (100 mg total) by mouth 3 (three) times daily as needed for pain., Disp: 10 tablet, Rfl: 0   predniSONE (DELTASONE) 10 MG tablet, Take 3 tabs (30mg ) twice daily for 3 days, then 2 tabs (20mg ) twice daily for 3 days, then 1 tab (10mg ) twice daily for 3 days, then STOP., Disp: 36 tablet, Rfl: 0   Rimegepant Sulfate (NURTEC) 75 MG TBDP, Take 1 tablet (75 mg total) by mouth daily as needed., Disp: 16 tablet, Rfl: 11   risedronate (ACTONEL) 150 MG tablet, Take 1 tablet (150 mg total) by mouth every 30 (thirty) days. with water on empty stomach, nothing by mouth or lie down for next 30 minutes., Disp: 12 tablet, Rfl: 1   rizatriptan (MAXALT-MLT) 10 MG disintegrating tablet, Dissolve 1 tablet (10 mg total) by mouth as needed at onset of migraine. May take another tablet 2 hours later if needed, max 2 tablets in 24 hours., Disp: 27 tablet, Rfl: 3   simethicone (MYLICON) 80 MG chewable tablet, Chew 80 mg by mouth every 6 (six) hours as needed for flatulence., Disp: , Rfl:    tezepelumab-ekko (TEZSPIRE) 210 MG/1. syringe, Inject 1.91 mLs (210 mg total) into the skin every 28 (twenty-eight) days., Disp: 1.91 mL, Rfl: 11   tizanidine (ZANAFLEX) 6 MG capsule, Take 1 capsule (6 mg total) by mouth at bedtime., Disp: 90 capsule, Rfl:  3   triamcinolone ointment (KENALOG) 0.1 %, Apply topically 2 (two) times daily., Disp: 90 g, Rfl: 0   valACYclovir (VALTREX) 500 MG tablet, Take 2 tablets (1,000 mg total) by mouth 2 (two) times daily., Disp: 360 tablet, Rfl: 0   Vibegron (GEMTESA) 75 MG TABS, Take 1 tablet every day by oral route for 90 days., Disp: 90 tablet, Rfl: 4  Current Facility-Administered Medications:    tezepelumab-ekko (TEZSPIRE) 210 MG/1. syringe 210 mg, 210 mg, Subcutaneous, Q28 days, Alfonse Spruce, MD, 210 mg at 11/12/23 1746   Objective:     There were no vitals filed for this visit.    There is no height or weight on file  to calculate BMI.    Physical Exam:    ***   Electronically signed by:  Aleen Sells [name] 9:04 AM 01/28/24

## 2024-01-29 ENCOUNTER — Ambulatory Visit: Payer: 59 | Admitting: Sports Medicine

## 2024-01-29 ENCOUNTER — Ambulatory Visit: Payer: 59

## 2024-01-29 VITALS — BP 118/82 | HR 62 | Ht 66.0 in | Wt 182.0 lb

## 2024-01-29 DIAGNOSIS — M542 Cervicalgia: Secondary | ICD-10-CM | POA: Diagnosis not present

## 2024-01-29 DIAGNOSIS — M546 Pain in thoracic spine: Secondary | ICD-10-CM | POA: Diagnosis not present

## 2024-01-29 DIAGNOSIS — M545 Low back pain, unspecified: Secondary | ICD-10-CM

## 2024-01-29 DIAGNOSIS — M9903 Segmental and somatic dysfunction of lumbar region: Secondary | ICD-10-CM

## 2024-01-29 DIAGNOSIS — M9902 Segmental and somatic dysfunction of thoracic region: Secondary | ICD-10-CM | POA: Diagnosis not present

## 2024-01-29 DIAGNOSIS — J4550 Severe persistent asthma, uncomplicated: Secondary | ICD-10-CM

## 2024-01-29 DIAGNOSIS — M9905 Segmental and somatic dysfunction of pelvic region: Secondary | ICD-10-CM | POA: Diagnosis not present

## 2024-01-29 DIAGNOSIS — M25511 Pain in right shoulder: Secondary | ICD-10-CM

## 2024-01-29 DIAGNOSIS — M9908 Segmental and somatic dysfunction of rib cage: Secondary | ICD-10-CM

## 2024-01-29 DIAGNOSIS — M9901 Segmental and somatic dysfunction of cervical region: Secondary | ICD-10-CM

## 2024-01-29 DIAGNOSIS — G8929 Other chronic pain: Secondary | ICD-10-CM

## 2024-01-29 NOTE — Patient Instructions (Addendum)
PT referral  4 week follow up

## 2024-02-01 ENCOUNTER — Other Ambulatory Visit: Payer: Self-pay

## 2024-02-16 ENCOUNTER — Other Ambulatory Visit: Payer: Self-pay

## 2024-02-16 ENCOUNTER — Ambulatory Visit (INDEPENDENT_AMBULATORY_CARE_PROVIDER_SITE_OTHER): Payer: 59 | Admitting: Neurology

## 2024-02-16 DIAGNOSIS — G43709 Chronic migraine without aura, not intractable, without status migrainosus: Secondary | ICD-10-CM

## 2024-02-16 MED ORDER — ONABOTULINUMTOXINA 200 UNITS IJ SOLR
155.0000 [IU] | Freq: Once | INTRAMUSCULAR | Status: AC
  Administered 2024-02-16: 155 [IU] via INTRAMUSCULAR

## 2024-02-16 NOTE — Progress Notes (Signed)
 Consent Form Botulism Toxin Injection For Chronic Migraine  02/16/2024: May be moving to [place]. >50 % relief migraine and headache  freq and severity. Patient feels that her migraines cause her jaw to ache in her jaw aching also can cause her migraines to worsen it is a trigger for migraines included 10 units in each masseter to see if that helps with migraine severity.   10/27/2023: Stable. Feels jaw pain during migraines that is caused by migraine and worsens migraines, injected 10u in masseters bilat and states this has helped her migraines  08/04/2023: >50 % relief migraine and headache  freq and severity Tught muscles will give tizanidine Discussed meloxicam, don;t use with ibuprofen in the same class, watch kidney function Also watch for medication overuse taking too much ibuprofen or tylenol or triptans  Meds ordered this encounter  Medications   botulinum toxin Type A (BOTOX) injection 155 Units    Botox- 200 units x 1 vial Lot: [id number] Expiration: 03/2026 NDC: 4540-9811-91   Dx: Y78.295 S/P     Reviewed orally with patient, additionally signature is on file:  Botulism toxin has been approved by the Federal drug administration for treatment of chronic migraine. Botulism toxin does not cure chronic migraine and it may not be effective in some patients.  The administration of botulism toxin is accomplished by injecting a small amount of toxin into the muscles of the neck and head. Dosage must be titrated for each individual. Any benefits resulting from botulism toxin tend to wear off after 3 months with a repeat injection required if benefit is to be maintained. Injections are usually done every 3-4 months with maximum effect peak achieved by about 2 or 3 weeks. Botulism toxin is expensive and you should be sure of what costs you will incur resulting from the injection.  The side effects of botulism toxin use for chronic migraine may include:   -Transient, and usually mild,  facial weakness with facial injections  -Transient, and usually mild, head or neck weakness with head/neck injections  -Reduction or loss of forehead facial animation due to forehead muscle weakness  -Eyelid drooping  -Dry eye  -Pain at the site of injection or bruising at the site of injection  -Double vision  -Potential unknown long term risks  Contraindications: You should not have Botox if you are pregnant, nursing, allergic to albumin, have an infection, skin condition, or muscle weakness at the site of the injection, or have myasthenia gravis, Lambert-Eaton syndrome, or ALS.  It is also possible that as with any injection, there may be an allergic reaction or no effect from the medication. Reduced effectiveness after repeated injections is sometimes seen and rarely infection at the injection site may occur. All care will be taken to prevent these side effects. If therapy is given over a long time, atrophy and wasting in the muscle injected may occur. Occasionally the patient's become refractory to treatment because they develop antibodies to the toxin. In this event, therapy needs to be modified.  I have read the above information and consent to the administration of botulism toxin.    BOTOX PROCEDURE NOTE FOR MIGRAINE HEADACHE    Contraindications and precautions discussed with patient(above). Aseptic procedure was observed and patient tolerated procedure. Procedure performed by Dr. Artemio Aly  The condition has existed for more than 6 months, and pt does not have a diagnosis of ALS, Myasthenia Gravis or Lambert-Eaton Syndrome.  Risks and benefits of injections discussed and pt agrees to proceed with the  procedure.  Written consent obtained  These injections are medically necessary. Pt  receives good benefits from these injections. These injections do not cause sedations or hallucinations which the oral therapies may cause.  Description of procedure:  The patient was placed in a  sitting position. The standard protocol was used for Botox as follows, with 5 units of Botox injected at each site:   -Procerus muscle, midline injection  -Corrugator muscle, bilateral injection  -Frontalis muscle, bilateral injection, with 2 sites each side, medial injection was performed in the upper one third of the frontalis muscle, in the region vertical from the medial inferior edge of the superior orbital rim. The lateral injection was again in the upper one third of the forehead vertically above the lateral limbus of the cornea, 1.5 cm lateral to the medial injection site.  -Temporalis muscle injection, 4 sites, bilaterally. The first injection was 3 cm above the tragus of the ear, second injection site was 1.5 cm to 3 cm up from the first injection site in line with the tragus of the ear. The third injection site was 1.5-3 cm forward between the first 2 injection sites. The fourth injection site was 1.5 cm posterior to the second injection site.   -Occipitalis muscle injection, 3 sites, bilaterally. The first injection was done one half way between the occipital protuberance and the tip of the mastoid process behind the ear. The second injection site was done lateral and superior to the first, 1 fingerbreadth from the first injection. The third injection site was 1 fingerbreadth superiorly and medially from the first injection site.  -Cervical paraspinal muscle injection, 2 sites, bilateral knee first injection site was 1 cm from the midline of the cervical spine, 3 cm inferior to the lower border of the occipital protuberance. The second injection site was 1.5 cm superiorly and laterally to the first injection site.  -Trapezius muscle injection was performed at 3 sites, bilaterally. The first injection site was in the upper trapezius muscle halfway between the inflection point of the neck, and the acromion. The second injection site was one half way between the acromion and the first  injection site. The third injection was done between the first injection site and the inflection point of the neck.   Will return for repeat injection in 3 months.   200 units of Botox was used, any Botox not injected was wasted. The patient tolerated the procedure well, there were no complications of the above procedure.

## 2024-02-16 NOTE — Progress Notes (Signed)
 Botox- 200 units x 1 vial Lot: [id number] Expiration: 03/2026 NDC: 1610-9604-54   Bacteriostatic 0.9% Sodium Chloride- 4 mL  Lot: [id number] Expiration: 10/08/2024 NDC: 1478-2956-21   Dx: H08.657 S/P  Witnessed by Truitt Leep RN Drawn up by Leeann Must RN

## 2024-02-19 ENCOUNTER — Other Ambulatory Visit: Payer: Self-pay

## 2024-02-19 NOTE — Progress Notes (Signed)
 Specialty Pharmacy Refill Coordination Note  Donna Park is a 53 y.o. female contacted today regarding refills of specialty medication(s) [name] [name])   Patient requested Courier to Provider Office   Delivery date: 02/24/24   Verified address: [facility], [address] [place], [place], [address]   Medication will be filled on 02/23/24.

## 2024-02-23 ENCOUNTER — Other Ambulatory Visit: Payer: Self-pay

## 2024-02-23 ENCOUNTER — Other Ambulatory Visit (HOSPITAL_COMMUNITY): Payer: Self-pay

## 2024-02-25 NOTE — Progress Notes (Unsigned)
 Donna [facility]ells D.[facility] [address] [place] [address] Phone: [phone]   Assessment and Plan:     There are no diagnoses linked to this encounter.  *** - Patient has received relief with OMT in the past.  Elects for repeat OMT today.  Tolerated well per note below. - Decision today to treat with OMT was based on Physical Exam   After verbal consent patient was treated with HVLA (high velocity low amplitude), ME (muscle energy), FPR (flex positional release), ST (soft tissue), PC/PD (Pelvic Compression/ Pelvic Decompression) techniques in cervical, rib, thoracic, lumbar, and pelvic areas. Patient tolerated the procedure well with improvement in symptoms.  Patient educated on potential side effects of soreness and recommended to rest, hydrate, and use Tylenol as needed for pain control.   Pertinent previous records reviewed include ***    Follow Up: ***     Subjective:   I, Donna Park, am serving as a Neurosurgeon for [employer]  Chief Complaint: neck pain    HPI:    08/07/2023 Patient states her low back and neck are flared and here for adjustment    09/04/2023 Patient states that her upper trap is tight , right shoulder tightness and pain, low back flare of tightness she isnt able to stand up straight    10/09/2023 Patient states that she is tight. Has been trying to do some stretches.    10/16/2023 Patient states neck  and shoulder flare    11/13/2023 Patient states thoracic and cervical tightness/stiffness     11/26/2023 Patient states anterior shoulder/ clavicle pain. Decreased ROM. Ibu doesn't help . 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 Heat helps. Pain for about 2 weeks. Would like to know if she can get a refill of fosamax    01/29/2024 Patient states right shoulder is still in pain,  the pain is deep. Neck is alright for the moment but can feel it  trying to pull   02/26/2024 Patient states   Relevant Historical Information: Migraines, GERD, history of Roux-en-Y gastric bypass Additional pertinent review of systems negative.  Current Outpatient Medications  Medication Sig Dispense Refill   acetaminophen (TYLENOL) 500 MG tablet Take by mouth.     acyclovir ointment (ZOVIRAX) 5 % Apply onto the affected area every 3 hours. 15 g 1   albuterol (VENTOLIN HFA) 108 (90 Base) MCG/ACT inhaler Inhale 1 - 2 puffs into the lungs every 6 hours as needed for wheezing or shortness of breath. 20.1 g 0   alendronate (FOSAMAX) 70 MG tablet Take 1 tablet (70 mg total) by mouth once a week. Take with a full glass of water on an empty stomach. 12 tablet 1   amitriptyline (ELAVIL) 10 MG tablet Take 1 tablet (10 mg total) by mouth at bedtime. 90 tablet 1   Azelastine-Fluticasone (DYMISTA) 137-50 MCG/ACT SUSP Place 2 sprays into both nostrils 2 (two) times daily as needed. 23 g 5   botulinum toxin Type A (BOTOX) 200 units injection Provider to inject 155 units into the muscles of the head and neck every 12 weeks. Discard remainder. 1 each 3   CALCIUM PO Take 1 tablet by mouth daily in the afternoon.     cetirizine (ZYRTEC) 10 MG tablet Take 2 tablets (20 mg total) by mouth 2 (two) times daily. 360 tablet 1   diazepam (VALIUM) 10 MG tablet Take 1 tablet (  10 mg total) by mouth at bedtime as needed for sleep. 30 tablet 2   EPINEPHrine 0.3 mg/0.3 mL IJ SOAJ injection Inject 0.3 mg into the muscle as needed for anaphylaxis. 2 each 1   fluconazole (DIFLUCAN) 150 MG tablet Take 1 tablet (150 mg total) by mouth daily. 14 tablet 5   Fluticasone-Umeclidin-Vilant (TRELEGY ELLIPTA) 200-62.5-25 MCG/ACT AEPB Inhale 1 puff into the lungs daily. 180 each 1   Galcanezumab-gnlm (EMGALITY) 120 MG/ML SOAJ Inject 120 mg into the skin every 30 (thirty) days. 3 mL 3   Hypertonic Nasal Wash (SINUS RINSE REFILL) PACK Use one packet dissolved in water as needed. (Patient taking  differently: Place 1 each into the nose in the morning and at bedtime.) 100 each 5   ibuprofen (ADVIL) 800 MG tablet Take 1 tablet (800 mg total) by mouth 3 (three) times daily as needed (pain.). 270 tablet 0   ipratropium (ATROVENT) 0.06 % nasal spray Place 2 sprays into both nostrils 4 (four) times daily. 45 mL 5   linaclotide (LINZESS) 290 MCG CAPS capsule Take 1 capsule (290 mcg total) by mouth daily before breakfast. 90 capsule 1   meloxicam (MOBIC) 15 MG tablet Take 1 tablet (15 mg total) by mouth at bedtime as needed for pain. 90 tablet 3   nystatin (MYCOSTATIN) 100000 UNIT/ML suspension Take 5 mLs (500,000 Units total) by mouth 4 (four) times daily. Gargle and spit. 473 mL 0   ondansetron (ZOFRAN-ODT) 4 MG disintegrating tablet Dissolve 1-2 tablets (4-8 mg total) by mouth every 8 (eight) hours as needed. May take with Rizatriptan 90 tablet 3   pantoprazole (PROTONIX) 40 MG tablet Take 1 tablet (40 mg total) by mouth at bedtime. 90 tablet 3   Pediatric Multiple Vit-C-FA (MULTIVITAMIN ANIMAL SHAPES, WITH CA/FA,) with C & FA chewable tablet Chew 2 tablets by mouth daily. "Bariactric advatage"     phenazopyridine (PYRIDIUM) 100 MG tablet Take 1 tablet (100 mg total) by mouth 3 (three) times daily as needed for pain. 10 tablet 0   predniSONE (DELTASONE) 10 MG tablet Take 3 tabs (30mg ) twice daily for 3 days, then 2 tabs (20mg ) twice daily for 3 days, then 1 tab (10mg ) twice daily for 3 days, then STOP. 36 tablet 0   Rimegepant Sulfate (NURTEC) 75 MG TBDP Take 1 tablet (75 mg total) by mouth daily as needed. 16 tablet 11   risedronate (ACTONEL) 150 MG tablet Take 1 tablet (150 mg total) by mouth every 30 (thirty) days. with water on empty stomach, nothing by mouth or lie down for next 30 minutes. 12 tablet 1   rizatriptan (MAXALT-MLT) 10 MG disintegrating tablet Dissolve 1 tablet (10 mg total) by mouth as needed at onset of migraine. May take another tablet 2 hours later if needed, max 2 tablets in 24  hours. 27 tablet 3   simethicone (MYLICON) 80 MG chewable tablet Chew 80 mg by mouth every 6 (six) hours as needed for flatulence.     tezepelumab-ekko (TEZSPIRE) 210 MG/1. syringe Inject 1.91 mLs (210 mg total) into the skin every 28 (twenty-eight) days. 1.91 mL 11   tizanidine (ZANAFLEX) 6 MG capsule Take 1 capsule (6 mg total) by mouth at bedtime. 90 capsule 3   triamcinolone ointment (KENALOG) 0.1 % Apply topically 2 (two) times daily. 90 g 0   valACYclovir (VALTREX) 500 MG tablet Take 2 tablets (1,000 mg total) by mouth 2 (two) times daily. 360 tablet 0   Vibegron (GEMTESA) 75 MG TABS Take 1 tablet  every day by oral route for 90 days. 90 tablet 4   Current Facility-Administered Medications  Medication Dose Route Frequency Provider Last Rate Last Admin   tezepelumab-ekko (TEZSPIRE) 210 MG/1. syringe 210 mg  210 mg Subcutaneous Q28 days Alfonse Spruce, MD   210 mg at 01/29/24 1621      Objective:     There were no vitals filed for this visit.    There is no height or weight on file to calculate BMI.    Physical Exam:     General: Well-appearing, cooperative, sitting comfortably in no acute distress.   OMT Physical Exam:  ASIS Compression Test: Positive Right Cervical: TTP paraspinal, *** Rib: Bilateral elevated first rib with TTP Thoracic: TTP paraspinal,*** Lumbar: TTP paraspinal,*** Pelvis: Right anterior innominate  Electronically signed by:  Donna Sells [facility] 7:30 AM 02/25/24

## 2024-02-26 ENCOUNTER — Ambulatory Visit: Payer: 59 | Admitting: Sports Medicine

## 2024-02-26 ENCOUNTER — Ambulatory Visit: Payer: 59

## 2024-02-26 VITALS — BP 110/82 | Ht 66.0 in | Wt 182.0 lb

## 2024-02-26 DIAGNOSIS — M9901 Segmental and somatic dysfunction of cervical region: Secondary | ICD-10-CM

## 2024-02-26 DIAGNOSIS — M546 Pain in thoracic spine: Secondary | ICD-10-CM

## 2024-02-26 DIAGNOSIS — M9903 Segmental and somatic dysfunction of lumbar region: Secondary | ICD-10-CM | POA: Diagnosis not present

## 2024-02-26 DIAGNOSIS — M9905 Segmental and somatic dysfunction of pelvic region: Secondary | ICD-10-CM

## 2024-02-26 DIAGNOSIS — M542 Cervicalgia: Secondary | ICD-10-CM

## 2024-02-26 DIAGNOSIS — J4550 Severe persistent asthma, uncomplicated: Secondary | ICD-10-CM | POA: Diagnosis not present

## 2024-02-26 DIAGNOSIS — M9908 Segmental and somatic dysfunction of rib cage: Secondary | ICD-10-CM

## 2024-02-26 DIAGNOSIS — M545 Low back pain, unspecified: Secondary | ICD-10-CM | POA: Diagnosis not present

## 2024-02-26 DIAGNOSIS — G8929 Other chronic pain: Secondary | ICD-10-CM | POA: Diagnosis not present

## 2024-02-26 DIAGNOSIS — M9902 Segmental and somatic dysfunction of thoracic region: Secondary | ICD-10-CM

## 2024-03-01 ENCOUNTER — Other Ambulatory Visit (HOSPITAL_COMMUNITY): Payer: Self-pay

## 2024-03-02 ENCOUNTER — Encounter: Payer: Self-pay | Admitting: Adult Health

## 2024-03-03 ENCOUNTER — Other Ambulatory Visit (HOSPITAL_COMMUNITY): Payer: Self-pay

## 2024-03-03 ENCOUNTER — Other Ambulatory Visit: Payer: Self-pay

## 2024-03-03 NOTE — Progress Notes (Signed)
 Specialty Pharmacy Refill Coordination Note  Donna Park is a 53 y.o. female contacted today regarding refills of specialty medication(s) [name] [name])   Patient requested (Patient-Rptd) Delivery   Delivery date: (Patient-Rptd) 03/17/24   Verified address: (Patient-Rptd) [facility], you have the address on file already   Medication will be filled on 04.10.25.

## 2024-03-04 ENCOUNTER — Other Ambulatory Visit: Payer: Self-pay

## 2024-03-17 NOTE — Progress Notes (Unsigned)
 [facility] Park [facility] [address] [place] [address] Phone: [phone]   Assessment and Plan:     There are no diagnoses linked to this encounter.  *** - Patient has received relief with OMT in the past.  Elects for repeat OMT today.  Tolerated well per note below. - Decision today to treat with OMT was based on Physical Exam   After verbal consent patient was treated with HVLA (high velocity low amplitude), ME (muscle energy), FPR (flex positional release), ST (soft tissue), PC/PD (Pelvic Compression/ Pelvic Decompression) techniques in cervical, rib, thoracic, lumbar, and pelvic areas. Patient tolerated the procedure well with improvement in symptoms.  Patient educated on potential side effects of soreness and recommended to rest, hydrate, and use Tylenol as needed for pain control.   Pertinent previous records reviewed include ***    Follow Up: ***     Subjective:   I, Donna Park, am serving as a Neurosurgeon for Doctor [employer]  Chief Complaint: neck pain    HPI:    08/07/2023 Patient states her low back and neck are flared and here for adjustment    09/04/2023 Patient states that her upper trap is tight , right shoulder tightness and pain, low back flare of tightness she isnt able to stand up straight    10/09/2023 Patient states that she is tight. Has been trying to do some stretches.    10/16/2023 Patient states neck  and shoulder flare    11/13/2023 Patient states thoracic and cervical tightness/stiffness     11/26/2023 Patient states anterior shoulder/ clavicle pain. Decreased ROM. Ibu doesn't help . 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 Heat helps. Pain for about 2 weeks. Would like to know if she can get a refill of fosamax    01/29/2024 Patient states right shoulder is still in pain,  the pain is deep. Neck is alright for the moment but can feel it  trying to pull    02/26/2024 Patient states neck is good , just here for an adjustment low back and hips are in pain   03/18/2024 Patient states   Relevant Historical Information: Migraines, GERD, history of Roux-en-Y gastric bypass Additional pertinent review of systems negative. Additional pertinent review of systems negative.  Current Outpatient Medications  Medication Sig Dispense Refill   acetaminophen (TYLENOL) 500 MG tablet Take by mouth.     acyclovir ointment (ZOVIRAX) 5 % Apply onto the affected area every 3 hours. 15 g 1   albuterol (VENTOLIN HFA) 108 (90 Base) MCG/ACT inhaler Inhale 1 - 2 puffs into the lungs every 6 hours as needed for wheezing or shortness of breath. 20.1 g 0   alendronate (FOSAMAX) 70 MG tablet Take 1 tablet (70 mg total) by mouth once a week. Take with a full glass of water on an empty stomach. 12 tablet 1   amitriptyline (ELAVIL) 10 MG tablet Take 1 tablet (10 mg total) by mouth at bedtime. 90 tablet 1   Azelastine-Fluticasone (DYMISTA) 137-50 MCG/ACT SUSP Place 2 sprays into both nostrils 2 (two) times daily as needed. 23 g 5   botulinum toxin Type A (BOTOX) 200 units injection Provider to inject 155 units into the muscles of the head and neck every 12 weeks. Discard remainder. 1 each 3   CALCIUM PO Take 1 tablet by mouth daily in the afternoon.     cetirizine (ZYRTEC)  10 MG tablet Take 2 tablets (20 mg total) by mouth 2 (two) times daily. 360 tablet 1   diazepam (VALIUM) 10 MG tablet Take 1 tablet (10 mg total) by mouth at bedtime as needed for sleep. 30 tablet 2   EPINEPHrine 0.3 mg/0.3 mL IJ SOAJ injection Inject 0.3 mg into the muscle as needed for anaphylaxis. 2 each 1   fluconazole (DIFLUCAN) 150 MG tablet Take 1 tablet (150 mg total) by mouth daily. 14 tablet 5   Fluticasone-Umeclidin-Vilant (TRELEGY ELLIPTA) 200-62.5-25 MCG/ACT AEPB Inhale 1 puff into the lungs daily. 180 each 1   Galcanezumab-gnlm (EMGALITY) 120 MG/ML SOAJ Inject 120 mg into the  skin every 30 (thirty) days. 3 mL 3   Hypertonic Nasal Wash (SINUS RINSE REFILL) PACK Use one packet dissolved in water as needed. (Patient taking differently: Place 1 each into the nose in the morning and at bedtime.) 100 each 5   ibuprofen (ADVIL) 800 MG tablet Take 1 tablet (800 mg total) by mouth 3 (three) times daily as needed (pain.). 270 tablet 0   ipratropium (ATROVENT) 0.06 % nasal spray Place 2 sprays into both nostrils 4 (four) times daily. 45 mL 5   linaclotide (LINZESS) 290 MCG CAPS capsule Take 1 capsule (290 mcg total) by mouth daily before breakfast. 90 capsule 1   meloxicam (MOBIC) 15 MG tablet Take 1 tablet (15 mg total) by mouth at bedtime as needed for pain. 90 tablet 3   nystatin (MYCOSTATIN) 100000 UNIT/ML suspension Take 5 mLs (500,000 Units total) by mouth 4 (four) times daily. Gargle and spit. 473 mL 0   ondansetron (ZOFRAN-ODT) 4 MG disintegrating tablet Dissolve 1-2 tablets (4-8 mg total) by mouth every 8 (eight) hours as needed. May take with Rizatriptan 90 tablet 3   pantoprazole (PROTONIX) 40 MG tablet Take 1 tablet (40 mg total) by mouth at bedtime. 90 tablet 3   Pediatric Multiple Vit-C-FA (MULTIVITAMIN ANIMAL SHAPES, WITH CA/FA,) with C & FA chewable tablet Chew 2 tablets by mouth daily. "Bariactric advatage"     phenazopyridine (PYRIDIUM) 100 MG tablet Take 1 tablet (100 mg total) by mouth 3 (three) times daily as needed for pain. 10 tablet 0   predniSONE (DELTASONE) 10 MG tablet Take 3 tabs (30mg ) twice daily for 3 days, then 2 tabs (20mg ) twice daily for 3 days, then 1 tab (10mg ) twice daily for 3 days, then STOP. 36 tablet 0   Rimegepant Sulfate (NURTEC) 75 MG TBDP Take 1 tablet (75 mg total) by mouth daily as needed. 16 tablet 11   risedronate (ACTONEL) 150 MG tablet Take 1 tablet (150 mg total) by mouth every 30 (thirty) days. with water on empty stomach, nothing by mouth or lie down for next 30 minutes. 12 tablet 1   rizatriptan (MAXALT-MLT) 10 MG disintegrating  tablet Dissolve 1 tablet (10 mg total) by mouth as needed at onset of migraine. May take another tablet 2 hours later if needed, max 2 tablets in 24 hours. 27 tablet 3   simethicone (MYLICON) 80 MG chewable tablet Chew 80 mg by mouth every 6 (six) hours as needed for flatulence.     tezepelumab-ekko (TEZSPIRE) 210 MG/1. syringe Inject 1.91 mLs (210 mg total) into the skin every 28 (twenty-eight) days. 1.91 mL 11   tizanidine (ZANAFLEX) 6 MG capsule Take 1 capsule (6 mg total) by mouth at bedtime. 90 capsule 3   triamcinolone ointment (KENALOG) 0.1 % Apply topically 2 (two) times daily. 90 g 0   valACYclovir (VALTREX)  500 MG tablet Take 2 tablets (1,000 mg total) by mouth 2 (two) times daily. 360 tablet 0   Vibegron (GEMTESA) 75 MG TABS Take 1 tablet every day by oral route for 90 days. 90 tablet 4   Current Facility-Administered Medications  Medication Dose Route Frequency Provider Last Rate Last Admin   tezepelumab-ekko (TEZSPIRE) 210 MG/1. syringe 210 mg  210 mg Subcutaneous Q28 days Alfonse Spruce, MD   210 mg at 02/26/24 1610      Objective:     There were no vitals filed for this visit.    There is no height or weight on file to calculate BMI.    Physical Exam:     General: Well-appearing, cooperative, sitting comfortably in no acute distress.   OMT Physical Exam:  ASIS Compression Test: Positive Right Cervical: TTP paraspinal, *** Rib: Bilateral elevated first rib with TTP Thoracic: TTP paraspinal,*** Lumbar: TTP paraspinal,*** Pelvis: Right anterior innominate  Electronically signed by:  Donna Park [facility] 7:37 AM 03/17/24

## 2024-03-18 ENCOUNTER — Other Ambulatory Visit: Payer: Self-pay

## 2024-03-18 ENCOUNTER — Ambulatory Visit: Admitting: Sports Medicine

## 2024-03-18 VITALS — BP 122/80 | HR 67 | Ht 66.0 in | Wt 182.0 lb

## 2024-03-18 DIAGNOSIS — M9903 Segmental and somatic dysfunction of lumbar region: Secondary | ICD-10-CM | POA: Diagnosis not present

## 2024-03-18 DIAGNOSIS — M25511 Pain in right shoulder: Secondary | ICD-10-CM | POA: Diagnosis not present

## 2024-03-18 DIAGNOSIS — M9908 Segmental and somatic dysfunction of rib cage: Secondary | ICD-10-CM

## 2024-03-18 DIAGNOSIS — M25551 Pain in right hip: Secondary | ICD-10-CM

## 2024-03-18 DIAGNOSIS — M9902 Segmental and somatic dysfunction of thoracic region: Secondary | ICD-10-CM | POA: Diagnosis not present

## 2024-03-18 DIAGNOSIS — M9905 Segmental and somatic dysfunction of pelvic region: Secondary | ICD-10-CM

## 2024-03-18 DIAGNOSIS — M9901 Segmental and somatic dysfunction of cervical region: Secondary | ICD-10-CM

## 2024-03-18 DIAGNOSIS — G8929 Other chronic pain: Secondary | ICD-10-CM

## 2024-03-18 DIAGNOSIS — M542 Cervicalgia: Secondary | ICD-10-CM | POA: Diagnosis not present

## 2024-03-18 NOTE — Patient Instructions (Signed)
Hip HEP  4 week follow up

## 2024-03-24 ENCOUNTER — Ambulatory Visit

## 2024-03-31 ENCOUNTER — Other Ambulatory Visit: Payer: Self-pay

## 2024-03-31 ENCOUNTER — Other Ambulatory Visit: Payer: Self-pay | Admitting: Neurology

## 2024-03-31 ENCOUNTER — Encounter: Payer: Self-pay | Admitting: Neurology

## 2024-03-31 ENCOUNTER — Encounter: Payer: Self-pay | Admitting: Adult Health

## 2024-03-31 NOTE — Progress Notes (Unsigned)
 Donna Park [name] [address] [place] [address] Phone: [phone]   Assessment and Plan:     There are no diagnoses linked to this encounter.  ***   Pertinent previous records reviewed include ***    Follow Up: ***     Subjective:   I, Donna Park, am serving as a Neurosurgeon for Doctor [employer]  Chief Complaint: neck pain    HPI:    08/07/2023 Patient states her low back and neck are flared and here for adjustment    09/04/2023 Patient states that her upper trap is tight , right shoulder tightness and pain, low back flare of tightness she isnt able to stand up straight    10/09/2023 Patient states that she is tight. Has been trying to do some stretches.    10/16/2023 Patient states neck  and shoulder flare    11/13/2023 Patient states thoracic and cervical tightness/stiffness     11/26/2023 Patient states anterior shoulder/ clavicle pain. Decreased ROM. Ibu doesn't help . 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 Heat helps. Pain for about 2 weeks. Would like to know if she can get a refill of fosamax     01/29/2024 Patient states right shoulder is still in pain,  the pain is deep. Neck is alright for the moment but can feel it trying to pull    02/26/2024 Patient states neck is good , just here for an adjustment low back and hips are in pain    03/18/2024 Patient states right hip is in pain. So in turn left hip is in pain. Neck is alright. Shoulder is still flared  04/01/2024 Patient states   Relevant Historical Information: Migraines, GERD, history of Roux-en-Y gastric bypass  Additional pertinent review of systems negative.   Current Outpatient Medications:    acetaminophen  (TYLENOL ) 500 MG tablet, Take by mouth., Disp: , Rfl:    acyclovir  ointment (ZOVIRAX ) 5 %, Apply onto the affected area every 3 hours., Disp: 15 g, Rfl: 1    albuterol  (VENTOLIN  HFA) 108 (90 Base) MCG/ACT inhaler, Inhale 1 - 2 puffs into the lungs every 6 hours as needed for wheezing or shortness of breath., Disp: 20.1 g, Rfl: 0   alendronate  (FOSAMAX ) 70 MG tablet, Take 1 tablet (70 mg total) by mouth once a week. Take with a full glass of water on an empty stomach., Disp: 12 tablet, Rfl: 1   amitriptyline  (ELAVIL ) 10 MG tablet, Take 1 tablet (10 mg total) by mouth at bedtime., Disp: 90 tablet, Rfl: 1   Azelastine -Fluticasone  (DYMISTA ) 137-50 MCG/ACT SUSP, Place 2 sprays into both nostrils 2 (two) times daily as needed., Disp: 23 g, Rfl: 5   botulinum toxin Type A  (BOTOX ) 200 units injection, Provider to inject 155 units into the muscles of the head and neck every 12 weeks. Discard remainder., Disp: 1 each, Rfl: 3   CALCIUM PO, Take 1 tablet by mouth daily in the afternoon., Disp: , Rfl:    cetirizine  (ZYRTEC ) 10 MG tablet, Take 2 tablets (20 mg total) by mouth 2 (two) times daily., Disp: 360 tablet, Rfl: 1   diazepam  (VALIUM ) 10 MG tablet, Take 1 tablet (10 mg total) by mouth at bedtime as needed for sleep., Disp: 30 tablet, Rfl: 2   EPINEPHrine  0.3 mg/0.3 mL IJ SOAJ injection, Inject 0.3 mg into the muscle as needed for anaphylaxis., Disp: 2 each, Rfl:  1   fluconazole  (DIFLUCAN ) 150 MG tablet, Take 1 tablet (150 mg total) by mouth daily., Disp: 14 tablet, Rfl: 5   Fluticasone -Umeclidin-Vilant (TRELEGY ELLIPTA ) 200-62.5-25 MCG/ACT AEPB, Inhale 1 puff into the lungs daily., Disp: 180 each, Rfl: 1   Galcanezumab -gnlm (EMGALITY ) 120 MG/ML SOAJ, Inject 120 mg into the skin every 30 (thirty) days., Disp: 3 mL, Rfl: 3   Hypertonic Nasal Wash (SINUS RINSE REFILL) PACK, Use one packet dissolved in water as needed. (Patient taking differently: Place 1 each into the nose in the morning and at bedtime.), Disp: 100 each, Rfl: 5   ibuprofen  (ADVIL ) 800 MG tablet, Take 1 tablet (800 mg total) by mouth 3 (three) times daily as needed (pain.)., Disp: 270 tablet, Rfl: 0    ipratropium (ATROVENT ) 0.06 % nasal spray, Place 2 sprays into both nostrils 4 (four) times daily., Disp: 45 mL, Rfl: 5   linaclotide  (LINZESS ) 290 MCG CAPS capsule, Take 1 capsule (290 mcg total) by mouth daily before breakfast., Disp: 90 capsule, Rfl: 1   meloxicam  (MOBIC ) 15 MG tablet, Take 1 tablet (15 mg total) by mouth at bedtime as needed for pain., Disp: 90 tablet, Rfl: 3   nystatin  (MYCOSTATIN ) 100000 UNIT/ML suspension, Take 5 mLs (500,000 Units total) by mouth 4 (four) times daily. Gargle and spit., Disp: 473 mL, Rfl: 0   ondansetron  (ZOFRAN -ODT) 4 MG disintegrating tablet, Dissolve 1-2 tablets (4-8 mg total) by mouth every 8 (eight) hours as needed. May take with Rizatriptan , Disp: 90 tablet, Rfl: 3   pantoprazole  (PROTONIX ) 40 MG tablet, Take 1 tablet (40 mg total) by mouth at bedtime., Disp: 90 tablet, Rfl: 3   Pediatric Multiple Vit-C-FA (MULTIVITAMIN ANIMAL SHAPES, WITH CA/FA,) with C & FA chewable tablet, Chew 2 tablets by mouth daily. "Bariactric advatage", Disp: , Rfl:    phenazopyridine  (PYRIDIUM ) 100 MG tablet, Take 1 tablet (100 mg total) by mouth 3 (three) times daily as needed for pain., Disp: 10 tablet, Rfl: 0   predniSONE  (DELTASONE ) 10 MG tablet, Take 3 tabs (30mg ) twice daily for 3 days, then 2 tabs (20mg ) twice daily for 3 days, then 1 tab (10mg ) twice daily for 3 days, then STOP., Disp: 36 tablet, Rfl: 0   Rimegepant Sulfate  (NURTEC) 75 MG TBDP, Take 1 tablet (75 mg total) by mouth daily as needed., Disp: 16 tablet, Rfl: 11   risedronate  (ACTONEL ) 150 MG tablet, Take 1 tablet (150 mg total) by mouth every 30 (thirty) days. with water on empty stomach, nothing by mouth or lie down for next 30 minutes., Disp: 12 tablet, Rfl: 1   rizatriptan  (MAXALT -MLT) 10 MG disintegrating tablet, Dissolve 1 tablet (10 mg total) by mouth as needed at onset of migraine. May take another tablet 2 hours later if needed, max 2 tablets in 24 hours., Disp: 27 tablet, Rfl: 3   simethicone  (MYLICON)  80 MG chewable tablet, Chew 80 mg by mouth every 6 (six) hours as needed for flatulence., Disp: , Rfl:    tezepelumab -ekko (TEZSPIRE ) 210 MG/1. syringe, Inject 1.91 mLs (210 mg total) into the skin every 28 (twenty-eight) days., Disp: 1.91 mL, Rfl: 11   tizanidine  (ZANAFLEX ) 6 MG capsule, Take 1 capsule (6 mg total) by mouth at bedtime., Disp: 90 capsule, Rfl: 3   triamcinolone  ointment (KENALOG ) 0.1 %, Apply topically 2 (two) times daily., Disp: 90 g, Rfl: 0   valACYclovir  (VALTREX ) 500 MG tablet, Take 2 tablets (1,000 mg total) by mouth 2 (two) times daily., Disp: 360 tablet, Rfl: 0  Vibegron  (GEMTESA ) 75 MG TABS, Take 1 tablet every day by oral route for 90 days., Disp: 90 tablet, Rfl: 4  Current Facility-Administered Medications:    tezepelumab -ekko (TEZSPIRE ) 210 MG/1. syringe 210 mg, 210 mg, Subcutaneous, Q28 days, Rochester Chuck, MD, 210 mg at 02/26/24 4098   Objective:     There were no vitals filed for this visit.    There is no height or weight on file to calculate BMI.    Physical Exam:    ***   Electronically signed by:  Marshall Skeeter [name] 2:09 PM 03/31/24

## 2024-04-01 ENCOUNTER — Other Ambulatory Visit (HOSPITAL_COMMUNITY): Payer: Self-pay

## 2024-04-01 ENCOUNTER — Ambulatory Visit

## 2024-04-01 ENCOUNTER — Other Ambulatory Visit: Payer: Self-pay | Admitting: Adult Health

## 2024-04-01 ENCOUNTER — Ambulatory Visit (INDEPENDENT_AMBULATORY_CARE_PROVIDER_SITE_OTHER): Admitting: Sports Medicine

## 2024-04-01 ENCOUNTER — Other Ambulatory Visit: Payer: Self-pay | Admitting: Neurology

## 2024-04-01 ENCOUNTER — Other Ambulatory Visit: Payer: Self-pay

## 2024-04-01 VITALS — BP 114/70 | HR 92 | Ht 66.0 in

## 2024-04-01 DIAGNOSIS — M546 Pain in thoracic spine: Secondary | ICD-10-CM

## 2024-04-01 DIAGNOSIS — M542 Cervicalgia: Secondary | ICD-10-CM

## 2024-04-01 DIAGNOSIS — J4550 Severe persistent asthma, uncomplicated: Secondary | ICD-10-CM

## 2024-04-01 DIAGNOSIS — G8929 Other chronic pain: Secondary | ICD-10-CM | POA: Diagnosis not present

## 2024-04-01 MED ORDER — VALACYCLOVIR HCL 500 MG PO TABS
1000.0000 mg | ORAL_TABLET | Freq: Two times a day (BID) | ORAL | 0 refills | Status: DC
  Filled 2024-04-01: qty 360, 90d supply, fill #0

## 2024-04-01 MED ORDER — TOPIRAMATE 25 MG PO TABS
25.0000 mg | ORAL_TABLET | Freq: Every day | ORAL | 6 refills | Status: AC
  Filled 2024-04-01: qty 30, 30d supply, fill #0

## 2024-04-01 MED ORDER — ACYCLOVIR 5 % EX OINT
1.0000 | TOPICAL_OINTMENT | CUTANEOUS | 1 refills | Status: AC
  Filled 2024-04-01: qty 15, 7d supply, fill #0
  Filled 2024-09-23: qty 15, 2d supply, fill #0
  Filled 2024-11-06 – 2024-11-18 (×2): qty 15, 2d supply, fill #1

## 2024-04-03 ENCOUNTER — Other Ambulatory Visit: Payer: Self-pay

## 2024-04-04 ENCOUNTER — Encounter: Payer: Self-pay | Admitting: Pharmacist

## 2024-04-04 ENCOUNTER — Other Ambulatory Visit: Payer: Self-pay

## 2024-04-07 ENCOUNTER — Other Ambulatory Visit: Payer: Self-pay

## 2024-04-11 ENCOUNTER — Other Ambulatory Visit (HOSPITAL_COMMUNITY): Payer: Self-pay

## 2024-04-11 ENCOUNTER — Telehealth: Payer: Self-pay

## 2024-04-11 NOTE — Telephone Encounter (Signed)
 I received a request to renew PA for Nurtec-Nurtec has not been mentioned in the last few clinical visits/procedure notes-below is what the insurance is requesting documentation of-Please advise:

## 2024-04-11 NOTE — Telephone Encounter (Signed)
 Pharmacy Patient Advocate Encounter   Received notification from Physician's Office that prior authorization for Nurtec 75MG  dispersible tablets is required/requested.   Insurance verification completed.   The patient is insured through [facility] .   Per test claim: PA required; PA submitted to above mentioned insurance via CoverMyMeds Key/confirmation #/EOC [id number] Status is pending

## 2024-04-13 ENCOUNTER — Other Ambulatory Visit (HOSPITAL_COMMUNITY): Payer: Self-pay

## 2024-04-13 ENCOUNTER — Encounter: Payer: Self-pay | Admitting: Adult Health

## 2024-04-13 NOTE — Telephone Encounter (Signed)
 Pharmacy Patient Advocate Encounter  Received notification from [facility] that Prior Authorization for Nurtec 75MG  dispersible tablets has been APPROVED from 04/13/2024 to 04/12/2025. Ran test claim, Copay is $0. This test claim was processed through [facility]- copay amounts may vary at other pharmacies due to pharmacy/plan contracts, or as the patient moves through the different stages of their insurance plan.   PA #/Case ID/Reference #: PA Case ID #: [id number]

## 2024-04-14 ENCOUNTER — Other Ambulatory Visit: Payer: Self-pay

## 2024-04-14 ENCOUNTER — Other Ambulatory Visit (HOSPITAL_COMMUNITY): Payer: Self-pay

## 2024-04-14 NOTE — Progress Notes (Signed)
 Specialty Pharmacy Refill Coordination Note  Donna Park is a 53 y.o. female contacted today regarding refills of specialty medication(s) Tezspire .  Patient requested (Patient-Rptd) Delivery   Delivery date: (Patient-Rptd) 04/15/24   Verified address: (Patient-Rptd) Deliver to [facility] on [address], across the [address] from [address]   Medication will be filled on 04/20/24. New delivery date is 04/21/24. Patient has been notified.   [facility] [address] [place],  [place] [address]

## 2024-04-14 NOTE — Progress Notes (Signed)
 Donna Park D.[facility] [address] [place] [address] Phone: [phone]   Assessment and Plan:     1. Neck pain 2. Chronic bilateral thoracic back pain 3. Somatic dysfunction of cervical region 4. Somatic dysfunction of thoracic region 5. Somatic dysfunction of lumbar region 6. Somatic dysfunction of pelvic region 7. Somatic dysfunction of rib region 8. Right hip pain -Chronic with exacerbation, subsequent visit - Improvement in multiple areas of musculoskeletal pain after trigger point injections at previous office visit, however patient continues to experience daily pain primarily through right hip and neck - Will obtain x-ray of right hip due to chronic ongoing pain and can further evaluate at follow-up visit - Continue Tylenol  for day-to-day pain relief - Patient has received relief with OMT in the past.  Elects for repeat OMT today.  Tolerated well per note below. - Decision today to treat with OMT was based on Physical Exam   After verbal consent patient was treated with HVLA (high velocity low amplitude), ME (muscle energy), FPR (flex positional release), ST (soft tissue), PC/PD (Pelvic Compression/ Pelvic Decompression) techniques in cervical, rib, thoracic, lumbar, and pelvic areas. Patient tolerated the procedure well with improvement in symptoms.  Patient educated on potential side effects of soreness and recommended to rest, hydrate, and use Tylenol  as needed for pain control.    9. Chronic pain of left ankle -Chronic with exacerbation, subsequent visit - Recurrent left ankle pain with history of left ankle surgery without specific MOI.  Suspect compensation with ongoing right hip pain may be a contributing factor, however patient states that she feels a popping/pressure sensation, so we will obtain x-ray at today's visit to ensure proper fixation  Pertinent previous records reviewed include none  Follow Up: 4 weeks for reevaluation.   Could consider repeat OMT.  Could consider further evaluation of chronic right hip pain versus left ankle pain.  Would review x-rays   Subjective:   I, Donna Park, am serving as a Neurosurgeon for [employer]  Chief Complaint: neck pain    HPI:    08/07/2023 Patient states her low back and neck are flared and here for adjustment    09/04/2023 Patient states that her upper trap is tight , right shoulder tightness and pain, low back flare of tightness she isnt able to stand up straight    10/09/2023 Patient states that she is tight. Has been trying to do some stretches.    10/16/2023 Patient states neck  and shoulder flare    11/13/2023 Patient states thoracic and cervical tightness/stiffness     11/26/2023 Patient states anterior shoulder/ clavicle pain. Decreased ROM. Ibu doesn't help . 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 Heat helps. Pain for about 2 weeks. Would like to know if she can get a refill of fosamax     01/29/2024 Patient states right shoulder is still in pain,  the pain is deep. Neck is alright for the moment but can feel it trying to pull    02/26/2024 Patient states neck is good , just here for an adjustment low back and hips are in pain    03/18/2024 Patient states right hip is in pain. So in turn left hip is in pain. Neck is alright. Shoulder is still flared   04/01/2024 Patient states injections for neck and down the bad.   04/15/2024 Patient states back is great. No pain  in the neck. Just an adjustment today. Right hip is tight and left ankle feels like its compacting and she can feel the metal.    Relevant Historical Information: Migraines, GERD, history of Roux-en-Y gastric bypass  Additional pertinent review of systems negative.  Current Outpatient Medications  Medication Sig Dispense Refill   acetaminophen  (TYLENOL ) 500 MG tablet Take by mouth.     acyclovir  ointment  (ZOVIRAX ) 5 % Apply onto the affected area every 3 hours. 15 g 1   albuterol  (VENTOLIN  HFA) 108 (90 Base) MCG/ACT inhaler Inhale 1 - 2 puffs into the lungs every 6 hours as needed for wheezing or shortness of breath. 20.1 g 0   alendronate  (FOSAMAX ) 70 MG tablet Take 1 tablet (70 mg total) by mouth once a week. Take with a full glass of water on an empty stomach. 12 tablet 1   amitriptyline  (ELAVIL ) 10 MG tablet Take 1 tablet (10 mg total) by mouth at bedtime. 90 tablet 1   Azelastine -Fluticasone  (DYMISTA ) 137-50 MCG/ACT SUSP Place 2 sprays into both nostrils 2 (two) times daily as needed. 23 g 5   botulinum toxin Type A  (BOTOX ) 200 units injection Provider to inject 155 units into the muscles of the head and neck every 12 weeks. Discard remainder. 1 each 3   CALCIUM PO Take 1 tablet by mouth daily in the afternoon.     cetirizine  (ZYRTEC ) 10 MG tablet Take 2 tablets (20 mg total) by mouth 2 (two) times daily. 360 tablet 1   diazepam  (VALIUM ) 10 MG tablet Take 1 tablet (10 mg total) by mouth at bedtime as needed for sleep. 30 tablet 2   EPINEPHrine  0.3 mg/0.3 mL IJ SOAJ injection Inject 0.3 mg into the muscle as needed for anaphylaxis. 2 each 1   fluconazole  (DIFLUCAN ) 150 MG tablet Take 1 tablet (150 mg total) by mouth daily. 14 tablet 5   Fluticasone -Umeclidin-Vilant (TRELEGY ELLIPTA ) 200-62.5-25 MCG/ACT AEPB Inhale 1 puff into the lungs daily. 180 each 1   Galcanezumab -gnlm (EMGALITY ) 120 MG/ML SOAJ Inject 120 mg into the skin every 30 (thirty) days. 3 mL 3   Hypertonic Nasal Wash (SINUS RINSE REFILL) PACK Use one packet dissolved in water as needed. (Patient taking differently: Place 1 each into the nose in the morning and at bedtime.) 100 each 5   ibuprofen  (ADVIL ) 800 MG tablet Take 1 tablet (800 mg total) by mouth 3 (three) times daily as needed (pain.). 270 tablet 0   ipratropium (ATROVENT ) 0.06 % nasal spray Place 2 sprays into both nostrils 4 (four) times daily. 45 mL 5   linaclotide   (LINZESS ) 290 MCG CAPS capsule Take 1 capsule (290 mcg total) by mouth daily before breakfast. 90 capsule 1   meloxicam  (MOBIC ) 15 MG tablet Take 1 tablet (15 mg total) by mouth at bedtime as needed for pain. 90 tablet 3   nystatin  (MYCOSTATIN ) 100000 UNIT/ML suspension Take 5 mLs (500,000 Units total) by mouth 4 (four) times daily. Gargle and spit. 473 mL 0   ondansetron  (ZOFRAN -ODT) 4 MG disintegrating tablet Dissolve 1-2 tablets (4-8 mg total) by mouth every 8 (eight) hours as needed. May take with Rizatriptan  90 tablet 3   pantoprazole  (PROTONIX ) 40 MG tablet Take 1 tablet (40 mg total) by mouth at bedtime. 90 tablet 3   Pediatric Multiple Vit-C-FA (MULTIVITAMIN ANIMAL SHAPES, WITH CA/FA,) with C & FA chewable tablet Chew 2 tablets by mouth daily. "Bariactric advatage"     phenazopyridine  (PYRIDIUM ) 100 MG tablet Take 1 tablet (100  mg total) by mouth 3 (three) times daily as needed for pain. 10 tablet 0   predniSONE  (DELTASONE ) 10 MG tablet Take 3 tabs (30mg ) twice daily for 3 days, then 2 tabs (20mg ) twice daily for 3 days, then 1 tab (10mg ) twice daily for 3 days, then STOP. 36 tablet 0   Rimegepant Sulfate  (NURTEC) 75 MG TBDP Take 1 tablet (75 mg total) by mouth daily as needed. 16 tablet 11   risedronate  (ACTONEL ) 150 MG tablet Take 1 tablet (150 mg total) by mouth every 30 (thirty) days. with water on empty stomach, nothing by mouth or lie down for next 30 minutes. 12 tablet 1   rizatriptan  (MAXALT -MLT) 10 MG disintegrating tablet Dissolve 1 tablet (10 mg total) by mouth as needed at onset of migraine. May take another tablet 2 hours later if needed, max 2 tablets in 24 hours. 27 tablet 3   simethicone  (MYLICON) 80 MG chewable tablet Chew 80 mg by mouth every 6 (six) hours as needed for flatulence.     tezepelumab -ekko (TEZSPIRE ) 210 MG/1. syringe Inject 1.91 mLs (210 mg total) into the skin every 28 (twenty-eight) days. 1.91 mL 11   tizanidine  (ZANAFLEX ) 6 MG capsule Take 1 capsule (6 mg  total) by mouth at bedtime. 90 capsule 3   topiramate  (TOPAMAX ) 25 MG tablet Take 1 tablet (25 mg total) by mouth at bedtime. 30 tablet 6   triamcinolone  ointment (KENALOG ) 0.1 % Apply topically 2 (two) times daily. 90 g 0   valACYclovir  (VALTREX ) 500 MG tablet Take 2 tablets (1,000 mg total) by mouth 2 (two) times daily. 360 tablet 0   Vibegron  (GEMTESA ) 75 MG TABS Take 1 tablet every day by oral route for 90 days. 90 tablet 4   Current Facility-Administered Medications  Medication Dose Route Frequency Provider Last Rate Last Admin   tezepelumab -ekko (TEZSPIRE ) 210 MG/1. syringe 210 mg  210 mg Subcutaneous Q28 days Rochester Chuck, MD   210 mg at 04/01/24 0856      Objective:     Vitals:   04/15/24 1033  BP: 120/80  Pulse: 97  SpO2: 99%  Weight: 181 lb (82.1 kg)  Height: 5\' 6"  (1.676 m)      Body mass index is 29.21 kg/m.    Physical Exam:     General: Well-appearing, cooperative, sitting comfortably in no acute distress.   OMT Physical Exam:  ASIS Compression Test: Positive Right Cervical: TTP paraspinal, C3 RLSR, C5 RRSL Rib: Bilateral elevated first rib with TTP Thoracic: TTP paraspinal, T6 RRSR, T9 RLSL Lumbar: TTP paraspinal, L1-3 RRSL Pelvis: Right anterior innominate  Electronically signed by:  Marshall Skeeter D.[facility] 12:02 PM 04/15/24

## 2024-04-15 ENCOUNTER — Ambulatory Visit (INDEPENDENT_AMBULATORY_CARE_PROVIDER_SITE_OTHER): Admitting: Sports Medicine

## 2024-04-15 ENCOUNTER — Ambulatory Visit (INDEPENDENT_AMBULATORY_CARE_PROVIDER_SITE_OTHER): Admitting: Adult Health

## 2024-04-15 ENCOUNTER — Ambulatory Visit (INDEPENDENT_AMBULATORY_CARE_PROVIDER_SITE_OTHER)

## 2024-04-15 ENCOUNTER — Other Ambulatory Visit (HOSPITAL_COMMUNITY)
Admission: RE | Admit: 2024-04-15 | Discharge: 2024-04-15 | Disposition: A | Discharge status: Home / Self Care | Source: Ambulatory Visit | Attending: Adult Health | Admitting: Adult Health

## 2024-04-15 VITALS — BP 120/80 | HR 97 | Ht 66.0 in | Wt 181.0 lb

## 2024-04-15 VITALS — BP 120/80 | HR 89 | Temp 97.5°F | Ht 66.0 in | Wt 181.0 lb

## 2024-04-15 DIAGNOSIS — M9905 Segmental and somatic dysfunction of pelvic region: Secondary | ICD-10-CM | POA: Diagnosis not present

## 2024-04-15 DIAGNOSIS — G8929 Other chronic pain: Secondary | ICD-10-CM | POA: Diagnosis not present

## 2024-04-15 DIAGNOSIS — M546 Pain in thoracic spine: Secondary | ICD-10-CM | POA: Diagnosis not present

## 2024-04-15 DIAGNOSIS — M9902 Segmental and somatic dysfunction of thoracic region: Secondary | ICD-10-CM | POA: Diagnosis not present

## 2024-04-15 DIAGNOSIS — M9903 Segmental and somatic dysfunction of lumbar region: Secondary | ICD-10-CM

## 2024-04-15 DIAGNOSIS — M160 Bilateral primary osteoarthritis of hip: Secondary | ICD-10-CM | POA: Diagnosis not present

## 2024-04-15 DIAGNOSIS — M542 Cervicalgia: Secondary | ICD-10-CM | POA: Diagnosis not present

## 2024-04-15 DIAGNOSIS — N898 Other specified noninflammatory disorders of vagina: Secondary | ICD-10-CM

## 2024-04-15 DIAGNOSIS — R300 Dysuria: Secondary | ICD-10-CM

## 2024-04-15 DIAGNOSIS — M25551 Pain in right hip: Secondary | ICD-10-CM

## 2024-04-15 DIAGNOSIS — M9908 Segmental and somatic dysfunction of rib cage: Secondary | ICD-10-CM

## 2024-04-15 DIAGNOSIS — M9901 Segmental and somatic dysfunction of cervical region: Secondary | ICD-10-CM | POA: Diagnosis not present

## 2024-04-15 DIAGNOSIS — Z4789 Encounter for other orthopedic aftercare: Secondary | ICD-10-CM | POA: Diagnosis not present

## 2024-04-15 DIAGNOSIS — Z113 Encounter for screening for infections with a predominantly sexual mode of transmission: Secondary | ICD-10-CM | POA: Diagnosis not present

## 2024-04-15 DIAGNOSIS — M25572 Pain in left ankle and joints of left foot: Secondary | ICD-10-CM

## 2024-04-15 LAB — POCT URINALYSIS DIPSTICK
Bilirubin, UA: POSITIVE
Blood, UA: NEGATIVE
Glucose, UA: NEGATIVE
Ketones, UA: NEGATIVE
Leukocytes, UA: NEGATIVE
Nitrite, UA: NEGATIVE
Protein, UA: NEGATIVE
Spec Grav, UA: 1.025 (ref 1.010–1.025)
Urobilinogen, UA: 0.2 U/dL
pH, UA: 5.5 (ref 5.0–8.0)

## 2024-04-15 NOTE — Patient Instructions (Addendum)
 Donna Park

## 2024-04-15 NOTE — Progress Notes (Signed)
 Subjective:    Patient ID: Donna Park, female    DOB: [birth year], 53 y.o.   MRN: [id number]  HPI 53 year old female who  has a past medical history of Allergy, Anxiety, Arthritis, Asthma, Chicken pox, Complication of anesthesia, Depression, DJD (degenerative joint disease), GERD (gastroesophageal reflux disease), H/O gastric bypass (2016), History of iron deficiency anemia, HSV infection, Migraines, Obese, Pneumonia, PONV (postoperative nausea and vomiting), Status post dilation of esophageal narrowing, and Varicose vein of leg.  She presents to the office today for concern of a UTI. She reports that she took a home test and it showed leukocytes. She reports that her symptoms include that of  frequent urination, abnormal urine odor, dysuria, and low back pain x 1 week. She has not had any fevers or chills.   She would also like to swab for BV for abnormal vaginal odor and thick discharge.  She is not concerned for STDs but would like to check    Review of Systems See HPI   Past Medical History:  Diagnosis Date   Allergy    Anxiety    Arthritis    Asthma    Chicken pox    Complication of anesthesia    slow to wake up from anesthesia   Depression    DJD (degenerative joint disease)    GERD (gastroesophageal reflux disease)    resolved after gastric surgery   H/O gastric bypass 2016   History of iron deficiency anemia    HSV infection    Migraines    Obese    Pneumonia    Childhood history   PONV (postoperative nausea and vomiting)    Status post dilation of esophageal narrowing    Varicose vein of leg     Social History   Socioeconomic History   Marital status: Married    Spouse name: Not on file   Number of children: 4   Years of education: Not on file   Highest education level: Associate degree: academic program  Occupational History   Not on file  Tobacco Use   Smoking status: Never   Smokeless tobacco: Never  Vaping Use   Vaping status: Never Used   Substance and Sexual Activity   Alcohol use: Yes    Comment: Social/Rare   Drug use: Never   Sexual activity: Yes    Birth control/protection: Surgical  Other Topics Concern   Not on file  Social History Narrative   Not on file   Social Drivers of Health   Financial Resource Strain: Medium Risk (04/15/2024)   Overall Financial Resource Strain (CARDIA)    Difficulty of Paying Living Expenses: Somewhat hard  Food Insecurity: No Food Insecurity (04/15/2024)   Hunger Vital Sign    Worried About Running Out of Food in the Last Year: Never true    Ran Out of Food in the Last Year: Never true  Transportation Needs: Unmet Transportation Needs (04/15/2024)   PRAPARE - Transportation    Lack of Transportation (Medical): Yes    Lack of Transportation (Non-Medical): Yes  Physical Activity: Insufficiently Active (04/15/2024)   Exercise Vital Sign    Days of Exercise per Week: 1 day    Minutes of Exercise per Session: 10 min  Stress: Stress Concern Present (04/15/2024)   [employer] of Occupational Health - Occupational Stress Questionnaire    Feeling of Stress : Very much  Social Connections: Moderately Integrated (04/15/2024)   Social Connection and Isolation Panel [NHANES]    Frequency  of Communication with Friends and Family: More than three times a week    Frequency of Social Gatherings with Friends and Family: Never    Attends Religious Services: More than 4 times per year    Active Member of Clubs or Organizations: Yes    Attends Banker Meetings: 1 to 4 times per year    Marital Status: Separated  Intimate Partner Violence: Not on file    Past Surgical History:  Procedure Laterality Date   ANKLE FRACTURE SURGERY Left 03/08/2009   BREAST SURGERY Bilateral    Cyst Removal   ESOPHAGEAL MANOMETRY N/A 09/24/2022   Procedure: ESOPHAGEAL MANOMETRY (EM);  Surgeon: Annis Kinder, DO;  Location: [facility];  Service: Gastroenterology;  Laterality: N/A;   GASTRIC  BYPASS  06/18/2015   LAPAROSCOPIC VAGINAL HYSTERECTOMY WITH SALPINGECTOMY Bilateral 05/15/2020   Procedure: LAPAROSCOPIC ASSISTED VAGINAL HYSTERECTOMY WITH SALPINGECTOMY;  Surgeon: Belle Box, MD;  Location[facility];  Service: Gynecology;  Laterality: Bilateral;  need bed   LASER ABLATION     Varicose veins   TONSILLECTOMY AND ADENOIDECTOMY  2001   TUBAL LIGATION  12/19/1999   UPPER GASTROINTESTINAL ENDOSCOPY     UPPER GI ENDOSCOPY     Uterine ablation  12/2018    Family History  Problem Relation Age of Onset   COPD Mother    Heart disease Mother    Alcohol abuse Mother    Depression Mother    Drug abuse Mother    High Cholesterol Mother    High blood pressure Mother    COPD Father    Aneurysm Father    Heart disease Father    Early death Father    Diabetes Father    Drug abuse Father    AAA (abdominal aortic aneurysm) Father        cause of death at 53   Arthritis Sister    Kidney disease Sister    Crohn's disease Sister    Ulcerative colitis Sister    Thyroid  disease Sister    Kidney disease Brother    Kidney disease Maternal Aunt    Diabetes Paternal Aunt    Asthma Neg Hx    Allergic rhinitis Neg Hx    Colon cancer Neg Hx    Rectal cancer Neg Hx    Stomach cancer Neg Hx     Allergies  Allergen Reactions   Cyclobenzaprine Shortness Of Breath and Other (See Comments)    Relaxed tongue    Misc. Sulfonamide Containing Compounds Anaphylaxis   Shrimp Flavor Agent (Non-Screening) Anaphylaxis   Sulfa Antibiotics Anaphylaxis, Hives and Swelling   Carisoprodol Hives   Cat Dander Itching    Cat; Skin test + 12-13-2010   Cephalexin  Other (See Comments)    Tongue burning   Metronidazole     not allergic; drug doesn't work for pt   Morphine Other (See Comments)    Feels like her head is going to "blow up"     Pollen Extract Itching    rhinitis TREES, grass; skin test + 12-13-10; sIgE + 03-2011   Topiramate      Brain fog   Adhesive [Tape] Rash     Other reaction(s): SKIN - rash (skin burns) PAPER TAPE IS OK    Tramadol Itching, Nausea And Vomiting and Rash    Current Outpatient Medications on File Prior to Visit  Medication Sig Dispense Refill   acetaminophen  (TYLENOL ) 500 MG tablet Take by mouth.     acyclovir  ointment (ZOVIRAX ) 5 %  Apply onto the affected area every 3 hours. 15 g 1   albuterol  (VENTOLIN  HFA) 108 (90 Base) MCG/ACT inhaler Inhale 1 - 2 puffs into the lungs every 6 hours as needed for wheezing or shortness of breath. 20.1 g 0   alendronate  (FOSAMAX ) 70 MG tablet Take 1 tablet (70 mg total) by mouth once a week. Take with a full glass of water on an empty stomach. 12 tablet 1   amitriptyline  (ELAVIL ) 10 MG tablet Take 1 tablet (10 mg total) by mouth at bedtime. 90 tablet 1   Azelastine -Fluticasone  (DYMISTA ) 137-50 MCG/ACT SUSP Place 2 sprays into both nostrils 2 (two) times daily as needed. 23 g 5   botulinum toxin Type A  (BOTOX ) 200 units injection Provider to inject 155 units into the muscles of the head and neck every 12 weeks. Discard remainder. 1 each 3   CALCIUM PO Take 1 tablet by mouth daily in the afternoon.     cetirizine  (ZYRTEC ) 10 MG tablet Take 2 tablets (20 mg total) by mouth 2 (two) times daily. 360 tablet 1   diazepam  (VALIUM ) 10 MG tablet Take 1 tablet (10 mg total) by mouth at bedtime as needed for sleep. 30 tablet 2   EPINEPHrine  0.3 mg/0.3 mL IJ SOAJ injection Inject 0.3 mg into the muscle as needed for anaphylaxis. 2 each 1   fluconazole  (DIFLUCAN ) 150 MG tablet Take 1 tablet (150 mg total) by mouth daily. 14 tablet 5   Fluticasone -Umeclidin-Vilant (TRELEGY ELLIPTA ) 200-62.5-25 MCG/ACT AEPB Inhale 1 puff into the lungs daily. 180 each 1   Galcanezumab -gnlm (EMGALITY ) 120 MG/ML SOAJ Inject 120 mg into the skin every 30 (thirty) days. 3 mL 3   Hypertonic Nasal Wash (SINUS RINSE REFILL) PACK Use one packet dissolved in water as needed. (Patient taking differently: Place 1 each into the nose in the morning  and at bedtime.) 100 each 5   ibuprofen  (ADVIL ) 800 MG tablet Take 1 tablet (800 mg total) by mouth 3 (three) times daily as needed (pain.). 270 tablet 0   ipratropium (ATROVENT ) 0.06 % nasal spray Place 2 sprays into both nostrils 4 (four) times daily. 45 mL 5   linaclotide  (LINZESS ) 290 MCG CAPS capsule Take 1 capsule (290 mcg total) by mouth daily before breakfast. 90 capsule 1   meloxicam  (MOBIC ) 15 MG tablet Take 1 tablet (15 mg total) by mouth at bedtime as needed for pain. 90 tablet 3   nystatin  (MYCOSTATIN ) 100000 UNIT/ML suspension Take 5 mLs (500,000 Units total) by mouth 4 (four) times daily. Gargle and spit. 473 mL 0   ondansetron  (ZOFRAN -ODT) 4 MG disintegrating tablet Dissolve 1-2 tablets (4-8 mg total) by mouth every 8 (eight) hours as needed. May take with Rizatriptan  90 tablet 3   pantoprazole  (PROTONIX ) 40 MG tablet Take 1 tablet (40 mg total) by mouth at bedtime. 90 tablet 3   Pediatric Multiple Vit-C-FA (MULTIVITAMIN ANIMAL SHAPES, WITH CA/FA,) with C & FA chewable tablet Chew 2 tablets by mouth daily. "Bariactric advatage"     phenazopyridine  (PYRIDIUM ) 100 MG tablet Take 1 tablet (100 mg total) by mouth 3 (three) times daily as needed for pain. 10 tablet 0   predniSONE  (DELTASONE ) 10 MG tablet Take 3 tabs (30mg ) twice daily for 3 days, then 2 tabs (20mg ) twice daily for 3 days, then 1 tab (10mg ) twice daily for 3 days, then STOP. 36 tablet 0   Rimegepant Sulfate  (NURTEC) 75 MG TBDP Take 1 tablet (75 mg total) by mouth daily as needed.  16 tablet 11   risedronate  (ACTONEL ) 150 MG tablet Take 1 tablet (150 mg total) by mouth every 30 (thirty) days. with water on empty stomach, nothing by mouth or lie down for next 30 minutes. 12 tablet 1   rizatriptan  (MAXALT -MLT) 10 MG disintegrating tablet Dissolve 1 tablet (10 mg total) by mouth as needed at onset of migraine. May take another tablet 2 hours later if needed, max 2 tablets in 24 hours. 27 tablet 3   simethicone  (MYLICON) 80 MG  chewable tablet Chew 80 mg by mouth every 6 (six) hours as needed for flatulence.     tezepelumab -ekko (TEZSPIRE ) 210 MG/1. syringe Inject 1.91 mLs (210 mg total) into the skin every 28 (twenty-eight) days. 1.91 mL 11   tizanidine  (ZANAFLEX ) 6 MG capsule Take 1 capsule (6 mg total) by mouth at bedtime. 90 capsule 3   topiramate  (TOPAMAX ) 25 MG tablet Take 1 tablet (25 mg total) by mouth at bedtime. 30 tablet 6   triamcinolone  ointment (KENALOG ) 0.1 % Apply topically 2 (two) times daily. 90 g 0   valACYclovir  (VALTREX ) 500 MG tablet Take 2 tablets (1,000 mg total) by mouth 2 (two) times daily. 360 tablet 0   Vibegron  (GEMTESA ) 75 MG TABS Take 1 tablet every day by oral route for 90 days. 90 tablet 4   Current Facility-Administered Medications on File Prior to Visit  Medication Dose Route Frequency Provider Last Rate Last Admin   tezepelumab -ekko (TEZSPIRE ) 210 MG/1. syringe 210 mg  210 mg Subcutaneous Q28 days Rochester Chuck, MD   210 mg at 04/01/24 0856    BP 120/80   Pulse 89   Temp (!) 97.5 F (36.4 C) (Oral)   Ht 5\' 6"  (1.676 m)   Wt 181 lb (82.1 kg)   SpO2 98%   BMI 29.21 kg/m       Objective:   Physical Exam Vitals and nursing note reviewed.  Constitutional:      Appearance: Normal appearance.  Cardiovascular:     Rate and Rhythm: Normal rate and regular rhythm.     Pulses: Normal pulses.     Heart sounds: Normal heart sounds.  Pulmonary:     Effort: Pulmonary effort is normal.     Breath sounds: Normal breath sounds.  Abdominal:     General: Abdomen is flat. Bowel sounds are normal. There is no distension.     Palpations: Abdomen is soft. There is no mass.     Tenderness: There is no abdominal tenderness. There is no right CVA tenderness or left CVA tenderness.     Hernia: No hernia is present.  Skin:    General: Skin is warm and dry.  Neurological:     General: No focal deficit present.     Mental Status: She is alert and oriented to person, place,  and time.  Psychiatric:        Mood and Affect: Mood normal.        Behavior: Behavior normal.        Thought Content: Thought content normal.        Judgment: Judgment normal.       Assessment & Plan:  1. Dysuria (Primary)  - POC Urinalysis Dipstick- negative for leuks and nitrites. Will send culture due to symptoms  - Urine Culture; Future  2. Discharge from the vagina  - Cervicovaginal ancillary only  3. Screening examination for STD (sexually transmitted disease)  - Cervicovaginal ancillary only  Alto Atta, NP

## 2024-04-16 LAB — URINE CULTURE
MICRO NUMBER:: 16436103
Result:: NO GROWTH
SPECIMEN QUALITY:: ADEQUATE

## 2024-04-18 ENCOUNTER — Encounter: Payer: Self-pay | Admitting: Sports Medicine

## 2024-04-18 ENCOUNTER — Encounter: Payer: Self-pay | Admitting: Adult Health

## 2024-04-18 LAB — CERVICOVAGINAL ANCILLARY ONLY
Bacterial Vaginitis (gardnerella): POSITIVE — AB
Candida Glabrata: NEGATIVE
Candida Vaginitis: NEGATIVE
Chlamydia: NEGATIVE
Comment: NEGATIVE
Comment: NEGATIVE
Comment: NEGATIVE
Comment: NEGATIVE
Comment: NEGATIVE
Comment: NORMAL
Neisseria Gonorrhea: NEGATIVE
Trichomonas: NEGATIVE

## 2024-04-19 ENCOUNTER — Encounter: Payer: Self-pay | Admitting: Pharmacist

## 2024-04-19 ENCOUNTER — Other Ambulatory Visit: Payer: Self-pay | Admitting: Adult Health

## 2024-04-19 ENCOUNTER — Other Ambulatory Visit: Payer: Self-pay

## 2024-04-19 ENCOUNTER — Ambulatory Visit: Payer: Self-pay | Admitting: Adult Health

## 2024-04-19 ENCOUNTER — Other Ambulatory Visit (HOSPITAL_COMMUNITY): Payer: Self-pay

## 2024-04-19 MED ORDER — TINIDAZOLE 500 MG PO TABS
1.0000 g | ORAL_TABLET | Freq: Every day | ORAL | 0 refills | Status: AC
Start: 2024-04-19 — End: 2024-04-25
  Filled 2024-04-19: qty 10, 5d supply, fill #0

## 2024-04-28 ENCOUNTER — Encounter: Payer: Self-pay | Admitting: Pharmacist

## 2024-04-28 ENCOUNTER — Encounter: Payer: Self-pay | Admitting: Adult Health

## 2024-04-28 ENCOUNTER — Ambulatory Visit (INDEPENDENT_AMBULATORY_CARE_PROVIDER_SITE_OTHER): Admitting: Adult Health

## 2024-04-28 ENCOUNTER — Other Ambulatory Visit (HOSPITAL_COMMUNITY): Payer: Self-pay

## 2024-04-28 ENCOUNTER — Other Ambulatory Visit: Payer: Self-pay

## 2024-04-28 ENCOUNTER — Other Ambulatory Visit (HOSPITAL_COMMUNITY)
Admission: RE | Admit: 2024-04-28 | Discharge: 2024-04-28 | Disposition: A | Discharge status: Home / Self Care | Source: Ambulatory Visit | Attending: Adult Health | Admitting: Adult Health

## 2024-04-28 VITALS — BP 102/76 | HR 86 | Temp 97.5°F | Ht 66.5 in | Wt 183.0 lb

## 2024-04-28 DIAGNOSIS — N9489 Other specified conditions associated with female genital organs and menstrual cycle: Secondary | ICD-10-CM | POA: Insufficient documentation

## 2024-04-28 DIAGNOSIS — N3281 Overactive bladder: Secondary | ICD-10-CM | POA: Diagnosis not present

## 2024-04-28 DIAGNOSIS — N760 Acute vaginitis: Secondary | ICD-10-CM | POA: Insufficient documentation

## 2024-04-28 DIAGNOSIS — Z114 Encounter for screening for human immunodeficiency virus [HIV]: Secondary | ICD-10-CM | POA: Diagnosis not present

## 2024-04-28 DIAGNOSIS — Z9884 Bariatric surgery status: Secondary | ICD-10-CM | POA: Diagnosis not present

## 2024-04-28 DIAGNOSIS — Z23 Encounter for immunization: Secondary | ICD-10-CM | POA: Diagnosis not present

## 2024-04-28 DIAGNOSIS — A6004 Herpesviral vulvovaginitis: Secondary | ICD-10-CM

## 2024-04-28 DIAGNOSIS — Z0000 Encounter for general adult medical examination without abnormal findings: Secondary | ICD-10-CM

## 2024-04-28 DIAGNOSIS — B9689 Other specified bacterial agents as the cause of diseases classified elsewhere: Secondary | ICD-10-CM | POA: Diagnosis not present

## 2024-04-28 DIAGNOSIS — Z113 Encounter for screening for infections with a predominantly sexual mode of transmission: Secondary | ICD-10-CM | POA: Insufficient documentation

## 2024-04-28 DIAGNOSIS — G43919 Migraine, unspecified, intractable, without status migrainosus: Secondary | ICD-10-CM | POA: Diagnosis not present

## 2024-04-28 DIAGNOSIS — F5101 Primary insomnia: Secondary | ICD-10-CM | POA: Diagnosis not present

## 2024-04-28 DIAGNOSIS — Z0001 Encounter for general adult medical examination with abnormal findings: Secondary | ICD-10-CM

## 2024-04-28 DIAGNOSIS — R632 Polyphagia: Secondary | ICD-10-CM

## 2024-04-28 DIAGNOSIS — M150 Primary generalized (osteo)arthritis: Secondary | ICD-10-CM

## 2024-04-28 DIAGNOSIS — K219 Gastro-esophageal reflux disease without esophagitis: Secondary | ICD-10-CM | POA: Diagnosis not present

## 2024-04-28 DIAGNOSIS — J4540 Moderate persistent asthma, uncomplicated: Secondary | ICD-10-CM | POA: Diagnosis not present

## 2024-04-28 LAB — URINALYSIS
Bilirubin Urine: NEGATIVE
Hgb urine dipstick: NEGATIVE
Ketones, ur: NEGATIVE
Leukocytes,Ua: NEGATIVE
Nitrite: NEGATIVE
Specific Gravity, Urine: 1.025 (ref 1.000–1.030)
Total Protein, Urine: NEGATIVE
Urine Glucose: NEGATIVE
Urobilinogen, UA: 0.2 (ref 0.0–1.0)
pH: 6 (ref 5.0–8.0)

## 2024-04-28 LAB — COMPREHENSIVE METABOLIC PANEL WITH GFR
ALT: 29 U/L (ref 0–35)
AST: 25 U/L (ref 0–37)
Albumin: 4.2 g/dL (ref 3.5–5.2)
Alkaline Phosphatase: 56 U/L (ref 39–117)
BUN: 18 mg/dL (ref 6–23)
CO2: 28 meq/L (ref 19–32)
Calcium: 8.7 mg/dL (ref 8.4–10.5)
Chloride: 104 meq/L (ref 96–112)
Creatinine, Ser: 0.75 mg/dL (ref 0.40–1.20)
GFR: 92.29 mL/min (ref >60.00)
Glucose, Bld: 91 mg/dL (ref 70–99)
Potassium: 3.7 meq/L (ref 3.5–5.1)
Sodium: 138 meq/L (ref 135–145)
Total Bilirubin: 0.5 mg/dL (ref 0.2–1.2)
Total Protein: 7.3 g/dL (ref 6.0–8.3)

## 2024-04-28 LAB — CBC WITH DIFFERENTIAL/PLATELET
Basophils Absolute: 0 10*3/uL (ref 0.0–0.1)
Basophils Relative: 0.7 % (ref 0.0–3.0)
Eosinophils Absolute: 0 10*3/uL (ref 0.0–0.7)
Eosinophils Relative: 0.6 % (ref 0.0–5.0)
HCT: 39.3 % (ref 36.0–46.0)
Hemoglobin: 12.9 g/dL (ref 12.0–15.0)
Lymphocytes Relative: 41.9 % (ref 12.0–46.0)
Lymphs Abs: 1.6 10*3/uL (ref 0.7–4.0)
MCHC: 32.9 g/dL (ref 30.0–36.0)
MCV: 92.7 fl (ref 78.0–100.0)
Monocytes Absolute: 0.5 10*3/uL (ref 0.1–1.0)
Monocytes Relative: 11.5 % (ref 3.0–12.0)
Neutro Abs: 1.8 10*3/uL (ref 1.4–7.7)
Neutrophils Relative %: 45.3 % (ref 43.0–77.0)
Platelets: 182 10*3/uL (ref 150.0–400.0)
RBC: 4.24 Mil/uL (ref 3.87–5.11)
RDW: 14.1 % (ref 11.5–15.5)
WBC: 3.9 10*3/uL — ABNORMAL LOW (ref 4.0–10.5)

## 2024-04-28 LAB — LIPID PANEL
Cholesterol: 190 mg/dL (ref 0–200)
HDL: 84.2 mg/dL (ref >39.00)
LDL Cholesterol: 95 mg/dL (ref 0–99)
NonHDL: 106
Total CHOL/HDL Ratio: 2
Triglycerides: 56 mg/dL (ref 0.0–149.0)
VLDL: 11.2 mg/dL (ref 0.0–40.0)

## 2024-04-28 LAB — IBC + FERRITIN
Ferritin: 11.6 ng/mL (ref 10.0–291.0)
Iron: 68 ug/dL (ref 42–145)
Saturation Ratios: 16.1 % — ABNORMAL LOW (ref 20.0–50.0)
TIBC: 422.8 ug/dL (ref 250.0–450.0)
Transferrin: 302 mg/dL (ref 212.0–360.0)

## 2024-04-28 LAB — FOLATE: Folate: 13.1 ng/mL (ref >5.9)

## 2024-04-28 LAB — TSH: TSH: 1.8 u[IU]/mL (ref 0.35–5.50)

## 2024-04-28 LAB — HEMOGLOBIN A1C: Hgb A1c MFr Bld: 5.4 % (ref 4.6–6.5)

## 2024-04-28 LAB — VITAMIN B12: Vitamin B-12: 691 pg/mL (ref 211–911)

## 2024-04-28 LAB — VITAMIN D 25 HYDROXY (VIT D DEFICIENCY, FRACTURES): VITD: 36.07 ng/mL (ref 30.00–100.00)

## 2024-04-28 MED ORDER — VALACYCLOVIR HCL 500 MG PO TABS
1000.0000 mg | ORAL_TABLET | Freq: Two times a day (BID) | ORAL | 3 refills | Status: AC
  Filled 2024-04-28: qty 360, 90d supply, fill #0
  Filled 2024-05-13: qty 120, 30d supply, fill #0
  Filled 2024-06-17 (×2): qty 360, 90d supply, fill #0
  Filled 2024-12-07 – 2024-12-09 (×2): qty 360, 90d supply, fill #1

## 2024-04-28 MED ORDER — IBUPROFEN 800 MG PO TABS
800.0000 mg | ORAL_TABLET | Freq: Three times a day (TID) | ORAL | 3 refills | Status: AC | PRN
  Filled 2024-04-28 – 2024-06-17 (×2): qty 270, 90d supply, fill #0
  Filled 2024-09-23: qty 270, 90d supply, fill #1
  Filled 2024-12-05: qty 270, 90d supply, fill #2

## 2024-04-28 NOTE — Progress Notes (Signed)
 Subjective:    Patient ID: Donna Park, female    DOB: [birth year], 53 y.o.   MRN: [id number]  HPI Patient presents for yearly preventative medicine examination. She is a pleasant 53 year old female who  has a past medical history of Allergy, Anxiety, Arthritis, Asthma, Chicken pox, Complication of anesthesia, Depression, DJD (degenerative joint disease), GERD (gastroesophageal reflux disease), H/O gastric bypass (2016), History of iron deficiency anemia, HSV infection, Migraines, Obese, Pneumonia, PONV (postoperative nausea and vomiting), Status post dilation of esophageal narrowing, and Varicose vein of leg.  GERD-uses Protonix  40 mg daily and feels well controlled on this medication.  HSV-takes Valtrex  500 mg twice daily. Has had recent outbreaks due to stress in her life.   Migraine headaches-  Takes Elavil  PRN  and Maxalt  PRN and gets botox  injections periodically. Also taking Nurec every other day and Emgality  monthly.  Managed by Neurology   Insomnia -currently managed with Valium  10 mg nightly as needed.  She does have a longstanding history of insomnia.  Has tried multiple medications in the past including being on melatonin.  Asthma-currently managed by allergy and asthma.  Prescribed albuterol  inhaler, Advair  inhaler, Trelegy inhaler ( she switched back and forth between Trelegy and Advair ) and Trespire injection.  She feels as though her symptoms are well controlled  History of Gastric Bypass -had Roux-en-Y surgery in 2016.  She denies nausea or vomiting.  Does have alternating bowel habits with mostly constipation since her area check surgery.  She did recently have a colonoscopy and had a few polyps removed.  She has tried MiraLAX and fiber without relief.  She has seen by GI for this issue in which time they did a colonoscopy.  No follow-up at this time.  She would like her labs checked  Constipation -uses Linzess  290 mg daily. This is managed by Gastroenterology.   OAB - uses  Gemtesa    Chronic Arthritic pain -mostly throughout her back and hips. She does use Mobic  on occasion but mostly just uses Motrin . She does see sports medicine periodically for trigger point injections.   Acute Issues  Vaginal Burning -she was seen about 2 weeks ago for "urination and abnormal urine odor.  Her urine culture did not show UTI but vaginal swab showed bacterial vaginosis.  She was treated with Tindamax  which she finished roughly 3 days ago.  She reports that she continues to have vaginal burning and would like to retest today.  Increased hunger -for an unknown amount of time.  She reports "I always feel hungry no matter what I eat".  She does have a family history of diabetes and would like an A1c and insulin checked today. Wt Readings from Last 3 Encounters:  04/28/24 183 lb (83 kg)  04/15/24 181 lb (82.1 kg)  04/15/24 181 lb (82.1 kg)   HIV testing - she would like to be tested today.   All immunizations and health maintenance protocols were reviewed with the patient and needed orders were placed.  Appropriate screening laboratory values were ordered for the patient including screening of hyperlipidemia, renal function and hepatic function. If indicated by BPH, a PSA was ordered.  Medication reconciliation,  past medical history, social history, problem list and allergies were reviewed in detail with the patient  Goals were established with regard to weight loss, exercise, and  diet in compliance with medications Wt Readings from Last 3 Encounters:  04/28/24 183 lb (83 kg)  04/15/24 181 lb (82.1 kg)  04/15/24 181 lb (  82.1 kg)    Review of Systems  Constitutional: Negative.   HENT: Negative.    Eyes: Negative.   Respiratory: Negative.    Cardiovascular: Negative.   Gastrointestinal:  Positive for constipation.  Endocrine: Negative.   Genitourinary:  Positive for vaginal pain.  Musculoskeletal:  Positive for arthralgias and back pain.  Skin: Negative.    Allergic/Immunologic: Negative.   Neurological:  Positive for headaches.  Hematological: Negative.   Psychiatric/Behavioral:  Positive for sleep disturbance.    Past Medical History:  Diagnosis Date   Allergy    Anxiety    Arthritis    Asthma    Chicken pox    Complication of anesthesia    slow to wake up from anesthesia   Depression    DJD (degenerative joint disease)    GERD (gastroesophageal reflux disease)    resolved after gastric surgery   H/O gastric bypass 2016   History of iron deficiency anemia    HSV infection    Migraines    Obese    Pneumonia    Childhood history   PONV (postoperative nausea and vomiting)    Status post dilation of esophageal narrowing    Varicose vein of leg     Social History   Socioeconomic History   Marital status: Married    Spouse name: Not on file   Number of children: 4   Years of education: Not on file   Highest education level: Associate degree: academic program  Occupational History   Not on file  Tobacco Use   Smoking status: Never   Smokeless tobacco: Never  Vaping Use   Vaping status: Never Used  Substance and Sexual Activity   Alcohol use: Yes    Comment: Social/Rare   Drug use: Never   Sexual activity: Yes    Birth control/protection: Surgical  Other Topics Concern   Not on file  Social History Narrative   Not on file   Social Drivers of Health   Financial Resource Strain: Medium Risk (04/15/2024)   Overall Financial Resource Strain (CARDIA)    Difficulty of Paying Living Expenses: Somewhat hard  Food Insecurity: No Food Insecurity (04/15/2024)   Hunger Vital Sign    Worried About Running Out of Food in the Last Year: Never true    Ran Out of Food in the Last Year: Never true  Transportation Needs: No Transportation Needs (04/15/2024)   Received from [facility]   PRAPARE - Transportation    Lack of Transportation (Medical): No    Lack of Transportation (Non-Medical): No  Recent Concern:  Transportation Needs - Unmet Transportation Needs (04/15/2024)   PRAPARE - Transportation    Lack of Transportation (Medical): Yes    Lack of Transportation (Non-Medical): Yes  Physical Activity: Insufficiently Active (04/15/2024)   Exercise Vital Sign    Days of Exercise per Week: 1 day    Minutes of Exercise per Session: 10 min  Stress: No Stress Concern Present (04/15/2024)   Received from [facility]   [employer] of Occupational Health - Occupational Stress Questionnaire    Feeling of Stress : Only a little  Recent Concern: Stress - Stress Concern Present (04/15/2024)   [employer] of Occupational Health - Occupational Stress Questionnaire    Feeling of Stress : Very much  Social Connections: Moderately Integrated (04/15/2024)   Social Connection and Isolation Panel [NHANES]    Frequency of Communication with Friends and Family: More than three times a week  Frequency of Social Gatherings with Friends and Family: Never    Attends Religious Services: More than 4 times per year    Active Member of Clubs or Organizations: Yes    Attends Banker Meetings: 1 to 4 times per year    Marital Status: Separated  Intimate Partner Violence: Not on file    Past Surgical History:  Procedure Laterality Date   ANKLE FRACTURE SURGERY Left 03/08/2009   BREAST SURGERY Bilateral    Cyst Removal   ESOPHAGEAL MANOMETRY N/A 09/24/2022   Procedure: ESOPHAGEAL MANOMETRY (EM);  Surgeon: Annis Kinder, DO;  Location: [facility];  Service: Gastroenterology;  Laterality: N/A;   GASTRIC BYPASS  06/18/2015   LAPAROSCOPIC VAGINAL HYSTERECTOMY WITH SALPINGECTOMY Bilateral 05/15/2020   Procedure: LAPAROSCOPIC ASSISTED VAGINAL HYSTERECTOMY WITH SALPINGECTOMY;  Surgeon: Belle Box, MD;  Location: [facility];  Service: Gynecology;  Laterality: Bilateral;  need bed   LASER ABLATION     Varicose veins   TONSILLECTOMY AND ADENOIDECTOMY  2001   TUBAL LIGATION   12/19/1999   UPPER GASTROINTESTINAL ENDOSCOPY     UPPER GI ENDOSCOPY     Uterine ablation  12/2018    Family History  Problem Relation Age of Onset   COPD Mother    Heart disease Mother    Alcohol abuse Mother    Depression Mother    Drug abuse Mother    High Cholesterol Mother    High blood pressure Mother    COPD Father    Aneurysm Father    Heart disease Father    Early death Father    Diabetes Father    Drug abuse Father    AAA (abdominal aortic aneurysm) Father        cause of death at 57   Arthritis Sister    Kidney disease Sister    Crohn's disease Sister    Ulcerative colitis Sister    Thyroid  disease Sister    Kidney disease Brother    Kidney disease Maternal Aunt    Diabetes Paternal Aunt    Asthma Neg Hx    Allergic rhinitis Neg Hx    Colon cancer Neg Hx    Rectal cancer Neg Hx    Stomach cancer Neg Hx     Allergies  Allergen Reactions   Cyclobenzaprine Shortness Of Breath and Other (See Comments)    Relaxed tongue    Misc. Sulfonamide Containing Compounds Anaphylaxis   Shrimp Flavor Agent (Non-Screening) Anaphylaxis   Sulfa Antibiotics Anaphylaxis, Hives and Swelling   Carisoprodol Hives   Cat Dander Itching    Cat; Skin test + 12-13-2010   Cephalexin  Other (See Comments)    Tongue burning   Metronidazole     not allergic; drug doesn't work for pt   Morphine Other (See Comments)    Feels like her head is going to "blow up"     Pollen Extract Itching    rhinitis TREES, grass; skin test + 12-13-10; sIgE + 03-2011   Topiramate      Brain fog   Adhesive [Tape] Rash    Other reaction(s): SKIN - rash (skin burns) PAPER TAPE IS OK    Tramadol Itching, Nausea And Vomiting and Rash    Current Outpatient Medications on File Prior to Visit  Medication Sig Dispense Refill   acetaminophen  (TYLENOL ) 500 MG tablet Take by mouth.     acyclovir  ointment (ZOVIRAX ) 5 % Apply onto the affected area every 3 hours. 15 g 1   albuterol  (VENTOLIN   HFA) 108 (90  Base) MCG/ACT inhaler Inhale 1 - 2 puffs into the lungs every 6 hours as needed for wheezing or shortness of breath. 20.1 g 0   alendronate  (FOSAMAX ) 70 MG tablet Take 1 tablet (70 mg total) by mouth once a week. Take with a full glass of water on an empty stomach. 12 tablet 1   amitriptyline  (ELAVIL ) 10 MG tablet Take 1 tablet (10 mg total) by mouth at bedtime. 90 tablet 1   Azelastine -Fluticasone  (DYMISTA ) 137-50 MCG/ACT SUSP Place 2 sprays into both nostrils 2 (two) times daily as needed. 23 g 5   botulinum toxin Type A  (BOTOX ) 200 units injection Provider to inject 155 units into the muscles of the head and neck every 12 weeks. Discard remainder. 1 each 3   CALCIUM PO Take 1 tablet by mouth daily in the afternoon.     cetirizine  (ZYRTEC ) 10 MG tablet Take 2 tablets (20 mg total) by mouth 2 (two) times daily. 360 tablet 1   diazepam  (VALIUM ) 10 MG tablet Take 1 tablet (10 mg total) by mouth at bedtime as needed for sleep. 30 tablet 2   EPINEPHrine  0.3 mg/0.3 mL IJ SOAJ injection Inject 0.3 mg into the muscle as needed for anaphylaxis. 2 each 1   fluconazole  (DIFLUCAN ) 150 MG tablet Take 1 tablet (150 mg total) by mouth daily. 14 tablet 5   Fluticasone -Umeclidin-Vilant (TRELEGY ELLIPTA ) 200-62.5-25 MCG/ACT AEPB Inhale 1 puff into the lungs daily. 180 each 1   Galcanezumab -gnlm (EMGALITY ) 120 MG/ML SOAJ Inject 120 mg into the skin every 30 (thirty) days. 3 mL 3   Hypertonic Nasal Wash (SINUS RINSE REFILL) PACK Use one packet dissolved in water as needed. (Patient taking differently: Place 1 each into the nose in the morning and at bedtime.) 100 each 5   ibuprofen  (ADVIL ) 800 MG tablet Take 1 tablet (800 mg total) by mouth 3 (three) times daily as needed (pain.). 270 tablet 0   ipratropium (ATROVENT ) 0.06 % nasal spray Place 2 sprays into both nostrils 4 (four) times daily. 45 mL 5   linaclotide  (LINZESS ) 290 MCG CAPS capsule Take 1 capsule (290 mcg total) by mouth daily before breakfast. 90 capsule 1    meloxicam  (MOBIC ) 15 MG tablet Take 1 tablet (15 mg total) by mouth at bedtime as needed for pain. 90 tablet 3   nystatin  (MYCOSTATIN ) 100000 UNIT/ML suspension Take 5 mLs (500,000 Units total) by mouth 4 (four) times daily. Gargle and spit. 473 mL 0   ondansetron  (ZOFRAN -ODT) 4 MG disintegrating tablet Dissolve 1-2 tablets (4-8 mg total) by mouth every 8 (eight) hours as needed. May take with Rizatriptan  90 tablet 3   pantoprazole  (PROTONIX ) 40 MG tablet Take 1 tablet (40 mg total) by mouth at bedtime. 90 tablet 3   Pediatric Multiple Vit-C-FA (MULTIVITAMIN ANIMAL SHAPES, WITH CA/FA,) with C & FA chewable tablet Chew 2 tablets by mouth daily. "Bariactric advatage"     Rimegepant Sulfate  (NURTEC) 75 MG TBDP Take 1 tablet (75 mg total) by mouth daily as needed. 16 tablet 11   risedronate  (ACTONEL ) 150 MG tablet Take 1 tablet (150 mg total) by mouth every 30 (thirty) days. with water on empty stomach, nothing by mouth or lie down for next 30 minutes. 12 tablet 1   rizatriptan  (MAXALT -MLT) 10 MG disintegrating tablet Dissolve 1 tablet (10 mg total) by mouth as needed at onset of migraine. May take another tablet 2 hours later if needed, max 2 tablets in 24 hours. 27 tablet  3   simethicone  (MYLICON) 80 MG chewable tablet Chew 80 mg by mouth every 6 (six) hours as needed for flatulence.     tezepelumab -ekko (TEZSPIRE ) 210 MG/1. syringe Inject 1.91 mLs (210 mg total) into the skin every 28 (twenty-eight) days. 1.91 mL 11   tizanidine  (ZANAFLEX ) 6 MG capsule Take 1 capsule (6 mg total) by mouth at bedtime. 90 capsule 3   topiramate  (TOPAMAX ) 25 MG tablet Take 1 tablet (25 mg total) by mouth at bedtime. 30 tablet 6   triamcinolone  ointment (KENALOG ) 0.1 % Apply topically 2 (two) times daily. 90 g 0   valACYclovir  (VALTREX ) 500 MG tablet Take 2 tablets (1,000 mg total) by mouth 2 (two) times daily. 360 tablet 0   Vibegron  (GEMTESA ) 75 MG TABS Take 1 tablet every day by oral route for 90 days. 90 tablet 4    Current Facility-Administered Medications on File Prior to Visit  Medication Dose Route Frequency Provider Last Rate Last Admin   tezepelumab -ekko (TEZSPIRE ) 210 MG/1. syringe 210 mg  210 mg Subcutaneous Q28 days Rochester Chuck, MD   210 mg at 04/01/24 0856    BP 102/76   Pulse 86   Temp (!) 97.5 F (36.4 C) (Oral)   Ht 5' 6.5" (1.689 m)   Wt 183 lb (83 kg)   SpO2 (!) 86%   BMI 29.09 kg/m       Objective:   Physical Exam Vitals and nursing note reviewed.  Constitutional:      General: She is not in acute distress.    Appearance: Normal appearance. She is overweight. She is not ill-appearing.  HENT:     Head: Normocephalic and atraumatic.     Right Ear: Tympanic membrane, ear canal and external ear normal. There is no impacted cerumen.     Left Ear: Tympanic membrane, ear canal and external ear normal. There is no impacted cerumen.     Nose: Nose normal. No congestion or rhinorrhea.     Mouth/Throat:     Mouth: Mucous membranes are moist.     Pharynx: Oropharynx is clear.  Eyes:     Extraocular Movements: Extraocular movements intact.     Conjunctiva/sclera: Conjunctivae normal.     Pupils: Pupils are equal, round, and reactive to light.  Neck:     Vascular: No carotid bruit.  Cardiovascular:     Rate and Rhythm: Normal rate and regular rhythm.     Pulses: Normal pulses.     Heart sounds: No murmur heard.    No friction rub. No gallop.  Pulmonary:     Effort: Pulmonary effort is normal.     Breath sounds: Normal breath sounds.  Abdominal:     General: Abdomen is flat. Bowel sounds are normal. There is no distension.     Palpations: Abdomen is soft. There is no mass.     Tenderness: There is no abdominal tenderness. There is no guarding or rebound.     Hernia: No hernia is present.  Musculoskeletal:        General: Normal range of motion.     Cervical back: Normal range of motion and neck supple.  Lymphadenopathy:     Cervical: No cervical  adenopathy.  Skin:    General: Skin is warm and dry.     Capillary Refill: Capillary refill takes less than 2 seconds.  Neurological:     General: No focal deficit present.     Mental Status: She is alert and oriented to person, place,  and time.  Psychiatric:        Mood and Affect: Mood normal.        Behavior: Behavior normal.        Thought Content: Thought content normal.        Judgment: Judgment normal.        Assessment & Plan:  1. Routine general medical examination at a health care facility (Primary) Today patient counseled on age appropriate routine health concerns for screening and prevention, each reviewed and up to date or declined. Immunizations reviewed and up to date or declined. Labs ordered and reviewed. Risk factors for depression reviewed and negative. Hearing function and visual acuity are intact. ADLs screened and addressed as needed. Functional ability and level of safety reviewed and appropriate. Education, counseling and referrals performed based on assessed risks today. Patient provided with a copy of personalized plan for preventive services.   2. Gastroesophageal reflux disease without esophagitis - Continue PPI  - CBC with Differential/Platelet; Future - Comprehensive metabolic panel with GFR; Future - Lipid panel; Future - TSH; Future - TSH - Lipid panel - Comprehensive metabolic panel with GFR - CBC with Differential/Platelet  3. Type 2 HSV infection of vulvovaginal region - Continue with Valtrex  - CBC with Differential/Platelet; Future - Comprehensive metabolic panel with GFR; Future - Lipid panel; Future - TSH; Future - valACYclovir  (VALTREX ) 500 MG tablet; Take 2 tablets (1,000 mg total) by mouth 2 (two) times daily.  Dispense: 360 tablet; Refill: 3 - TSH - Lipid panel - Comprehensive metabolic panel with GFR - CBC with Differential/Platelet  4. Intractable migraine without status migrainosus, unspecified migraine type - Per neurology  -  CBC with Differential/Platelet; Future - Comprehensive metabolic panel with GFR; Future - Lipid panel; Future - TSH; Future - TSH - Lipid panel - Comprehensive metabolic panel with GFR - CBC with Differential/Platelet  5. Primary insomnia - Continue with Valium  at bedtime  - CBC with Differential/Platelet; Future - Comprehensive metabolic panel with GFR; Future - Lipid panel; Future - TSH; Future - TSH - Lipid panel - Comprehensive metabolic panel with GFR - CBC with Differential/Platelet  6. Moderate persistent asthma, uncomplicated - Per allergy and asthma  - CBC with Differential/Platelet; Future - Comprehensive metabolic panel with GFR; Future - Lipid panel; Future - TSH; Future - TSH - Lipid panel - Comprehensive metabolic panel with GFR - CBC with Differential/Platelet  7. History of Roux-en-Y gastric bypass  - CBC with Differential/Platelet; Future - Comprehensive metabolic panel with GFR; Future - Lipid panel; Future - TSH; Future - VITAMIN D  25 Hydroxy (Vit-D Deficiency, Fractures); Future - Folate; Future - Vitamin B12; Future - PTH, Intact and Calcium; Future - Vitamin A ; Future - IBC + Ferritin; Future - Urinalysis; Future - Hemoglobin A1c; Future - Hemoglobin A1c - IBC + Ferritin - Vitamin A  - PTH, Intact and Calcium - Vitamin B12 - Folate - VITAMIN D  25 Hydroxy (Vit-D Deficiency, Fractures) - TSH - Lipid panel - Comprehensive metabolic panel with GFR - CBC with Differential/Platelet  8. OAB (overactive bladder) - Continue with Gemtiza  - CBC with Differential/Platelet; Future - Comprehensive metabolic panel with GFR; Future - Lipid panel; Future - TSH; Future - TSH - Lipid panel - Comprehensive metabolic panel with GFR - CBC with Differential/Platelet  9. Increased appetite  - CBC with Differential/Platelet; Future - Comprehensive metabolic panel with GFR; Future - Lipid panel; Future - TSH; Future - Insulin and C-Peptide;  Future - Hemoglobin A1c; Future - Hemoglobin A1c -  Insulin and C-Peptide - TSH - Lipid panel - Comprehensive metabolic panel with GFR - CBC with Differential/Platelet  10. Encounter for screening for HIV  - HIV Antibody (routine testing w rflx); Future - HIV Antibody (routine testing w rflx)  11. Need for pneumococcal vaccine  - Pneumococcal conjugate vaccine 20-valent (Prevnar 20)  12. Vaginal burning  - Urinalysis; Future - Cervicovaginal ancillary only; Future  13. Primary osteoarthritis involving multiple joints  - ibuprofen  (ADVIL ) 800 MG tablet; Take 1 tablet (800 mg total) by mouth 3 (three) times daily as needed (pain.).  Dispense: 270 tablet; Refill: 3  Killian Schwer, NP

## 2024-04-28 NOTE — Addendum Note (Signed)
 Addended by: Twyla Galeazzi R on: 04/28/2024 05:45 PM   Modules accepted: Orders

## 2024-04-28 NOTE — Patient Instructions (Signed)
 It was great seeing you today   We will follow up with you regarding your lab work   Please let me know if you need anything

## 2024-04-29 ENCOUNTER — Encounter: Payer: Self-pay | Admitting: Pharmacist

## 2024-04-29 ENCOUNTER — Other Ambulatory Visit: Payer: Self-pay

## 2024-04-29 ENCOUNTER — Other Ambulatory Visit (HOSPITAL_COMMUNITY): Payer: Self-pay

## 2024-04-29 ENCOUNTER — Ambulatory Visit: Payer: Self-pay | Admitting: Adult Health

## 2024-04-29 ENCOUNTER — Encounter: Payer: Self-pay | Admitting: Adult Health

## 2024-04-29 ENCOUNTER — Ambulatory Visit (INDEPENDENT_AMBULATORY_CARE_PROVIDER_SITE_OTHER)

## 2024-04-29 ENCOUNTER — Other Ambulatory Visit: Payer: Self-pay | Admitting: Adult Health

## 2024-04-29 DIAGNOSIS — J4550 Severe persistent asthma, uncomplicated: Secondary | ICD-10-CM | POA: Diagnosis not present

## 2024-04-29 LAB — CERVICOVAGINAL ANCILLARY ONLY
Bacterial Vaginitis (gardnerella): POSITIVE — AB
Candida Glabrata: NEGATIVE
Candida Vaginitis: NEGATIVE
Chlamydia: NEGATIVE
Comment: NEGATIVE
Comment: NEGATIVE
Comment: NEGATIVE
Comment: NEGATIVE
Comment: NEGATIVE
Comment: NORMAL
Neisseria Gonorrhea: NEGATIVE
Trichomonas: NEGATIVE

## 2024-04-29 LAB — INSULIN AND C-PEPTIDE, SERUM
C-Peptide: 1.7 ng/mL (ref 1.1–4.4)
INSULIN: 5 u[IU]/mL (ref 2.6–24.9)

## 2024-04-29 MED ORDER — BORIC ACID 600 MG VA SUPP
600.0000 mg | Freq: Every evening | VAGINAL | 0 refills | Status: AC
  Filled 2024-04-29: qty 30, 30d supply, fill #0

## 2024-04-29 MED ORDER — TINIDAZOLE 500 MG PO TABS
500.0000 mg | ORAL_TABLET | Freq: Two times a day (BID) | ORAL | 0 refills | Status: AC
  Filled 2024-04-29 – 2024-05-06 (×3): qty 14, 7d supply, fill #0

## 2024-05-01 ENCOUNTER — Other Ambulatory Visit: Payer: Self-pay

## 2024-05-02 LAB — VITAMIN A: Vitamin A (Retinoic Acid): 43 ug/dL (ref 38–98)

## 2024-05-02 LAB — PTH, INTACT AND CALCIUM
Calcium: 8.9 mg/dL (ref 8.6–10.4)
PTH: 31 pg/mL (ref 16–77)

## 2024-05-02 LAB — HIV ANTIBODY (ROUTINE TESTING W REFLEX): HIV 1&2 Ab, 4th Generation: NONREACTIVE

## 2024-05-03 ENCOUNTER — Other Ambulatory Visit: Payer: Self-pay

## 2024-05-04 ENCOUNTER — Other Ambulatory Visit: Payer: Self-pay

## 2024-05-05 ENCOUNTER — Other Ambulatory Visit: Payer: Self-pay

## 2024-05-06 ENCOUNTER — Other Ambulatory Visit: Payer: Self-pay

## 2024-05-06 ENCOUNTER — Other Ambulatory Visit (HOSPITAL_COMMUNITY): Payer: Self-pay

## 2024-05-06 ENCOUNTER — Ambulatory Visit: Attending: Adult Health | Admitting: Pharmacist

## 2024-05-06 ENCOUNTER — Encounter: Payer: Self-pay | Admitting: Pharmacist

## 2024-05-06 DIAGNOSIS — Z79899 Other long term (current) drug therapy: Secondary | ICD-10-CM

## 2024-05-06 NOTE — Progress Notes (Signed)
   S: Patient presents today for review of their specialty medication.   Patient is currently taking Tezspire  for asthma. Patient is managed by Dr. Idolina Maker for this.   Dosing: 210 mg subq q28days  Adherence: confirms.   Efficacy: pt endorses good control.  Monitoring:  S/Sx of hypersensitivity: none. Some itching at the injection site initially but nothing currently. PFTs: monitored by Dr. Fawn Hooks office  S/sx of infection: none   Current adverse effects: none reported   O:     Lab Results  Component Value Date   WBC 3.9 (L) 04/28/2024   HGB 12.9 04/28/2024   HCT 39.3 04/28/2024   MCV 92.7 04/28/2024   PLT 182.0 04/28/2024      Chemistry      Component Value Date/Time   NA 138 04/28/2024 0845   NA 140 06/29/2019 1055   K 3.7 04/28/2024 0845   CL 104 04/28/2024 0845   CO2 28 04/28/2024 0845   BUN 18 04/28/2024 0845   BUN 11 06/29/2019 1055   CREATININE 0.75 04/28/2024 0845      Component Value Date/Time   CALCIUM 8.9 04/28/2024 0845   CALCIUM 8.7 04/28/2024 0845   ALKPHOS 56 04/28/2024 0845   AST 25 04/28/2024 0845   ALT 29 04/28/2024 0845   BILITOT 0.5 04/28/2024 0845   BILITOT 0.5 06/29/2019 1055     A/P: 1. Medication review: patient currently on Tezspire  for asthma. She is tolerating this well with reported efficacy. I reviewed the medication with her. She has received confirmation of reimbursement aids from the drug manufacturer of Tezspire . I will pass this information along to the pharmacy. No recommendations for any changes at this time.   Marene Shape, PharmD, Becky Bowels, CPP Clinical Pharmacist [facility] [phone]

## 2024-05-09 ENCOUNTER — Other Ambulatory Visit: Payer: Self-pay

## 2024-05-09 NOTE — Progress Notes (Signed)
 Specialty Pharmacy Refill Coordination Note  Shaconda Hajduk is a 53 y.o. female contacted today regarding refills of specialty medication(s) OnabotulinumtoxinA  (Botox )   Patient requested Delivery   Delivery date: 05/11/24   Verified address: [facility] [address]   Medication will be filled on 06.03.25.   Sending to tiffany due to high copay. $290.41

## 2024-05-09 NOTE — Progress Notes (Signed)
 correction reaching out to office staff

## 2024-05-10 ENCOUNTER — Other Ambulatory Visit: Payer: Self-pay | Admitting: Allergy & Immunology

## 2024-05-10 ENCOUNTER — Telehealth: Payer: Self-pay

## 2024-05-10 ENCOUNTER — Other Ambulatory Visit: Payer: Self-pay

## 2024-05-10 ENCOUNTER — Other Ambulatory Visit (HOSPITAL_COMMUNITY): Payer: Self-pay

## 2024-05-10 NOTE — Progress Notes (Signed)
 Botox  fill cancelled due to copay. [facility] was notified by M.Chipps. Per 6.3.25 chart encounter patient was notified and will discuss plan at next office visit.

## 2024-05-10 NOTE — Progress Notes (Signed)
 Specialty Pharmacy Refill Coordination Note  Donna Park is a 53 y.o. female contacted today regarding refills of specialty medication(s) Tezepelumab -ekko (Tezspire )   Patient requested Delivery   Delivery date: 05/23/24   Verified address: [facility] [place]   Medication will be filled on 06.13.25.   Refill request is pending, rewrite is required.

## 2024-05-10 NOTE — Telephone Encounter (Signed)
 Called pt and informed her that copay was due to high deductible, she will come in for an OV on Thursday instead of receiving Botox . She states she's fine with not being injected but does want to discuss headache she's had for about a month.

## 2024-05-10 NOTE — Telephone Encounter (Signed)
 PT copay is $290.41 even with running the Botox  One copay card due to having the High deductible plan and is still paying towards that. I sent message to [facility] to make them aware.

## 2024-05-11 ENCOUNTER — Other Ambulatory Visit: Payer: Self-pay

## 2024-05-11 ENCOUNTER — Other Ambulatory Visit: Payer: Self-pay | Admitting: Pharmacist

## 2024-05-11 MED ORDER — TEZSPIRE 210 MG/1.91ML ~~LOC~~ SOSY
210.0000 mg | PREFILLED_SYRINGE | SUBCUTANEOUS | 11 refills | Status: DC
  Filled 2024-05-11: qty 1.91, 28d supply, fill #0

## 2024-05-11 MED ORDER — TEZSPIRE 210 MG/1.91ML ~~LOC~~ SOSY
210.0000 mg | PREFILLED_SYRINGE | SUBCUTANEOUS | 11 refills | Status: AC
  Filled 2024-05-11: qty 1.91, 28d supply, fill #0
  Filled 2024-06-14: qty 1.91, 28d supply, fill #1
  Filled 2024-07-12 – 2024-08-09 (×3): qty 1.91, 28d supply, fill #2
  Filled 2024-09-02 – 2024-11-01 (×7): qty 1.91, 28d supply, fill #3
  Filled 2024-12-07: qty 1.91, 28d supply, fill #4
  Filled 2024-12-28: qty 1.91, 28d supply, fill #5

## 2024-05-12 ENCOUNTER — Ambulatory Visit: Admitting: Neurology

## 2024-05-12 ENCOUNTER — Encounter: Payer: Self-pay | Admitting: Neurology

## 2024-05-12 VITALS — BP 123/79 | HR 82 | Ht 66.0 in | Wt 186.4 lb

## 2024-05-12 DIAGNOSIS — G43709 Chronic migraine without aura, not intractable, without status migrainosus: Secondary | ICD-10-CM

## 2024-05-12 DIAGNOSIS — G43901 Migraine, unspecified, not intractable, with status migrainosus: Secondary | ICD-10-CM | POA: Diagnosis not present

## 2024-05-12 MED ORDER — AJOVY 225 MG/1.5ML ~~LOC~~ SOAJ
225.0000 mg | SUBCUTANEOUS | 11 refills | Status: AC
  Filled 2024-05-12 – 2024-06-17 (×3): qty 1.5, 30d supply, fill #0
  Filled 2024-07-01 – 2024-07-16 (×5): qty 1.5, 30d supply, fill #1
  Filled 2024-08-18: qty 1.5, 30d supply, fill #2
  Filled 2024-09-23: qty 4.5, 90d supply, fill #3
  Filled 2024-12-05: qty 4.5, 90d supply, fill #4

## 2024-05-12 MED ORDER — METHYLPREDNISOLONE 4 MG PO TBPK
ORAL_TABLET | ORAL | 1 refills | Status: DC
  Filled 2024-05-12: qty 21, 6d supply, fill #0
  Filled 2024-07-01 – 2024-09-23 (×2): qty 21, 6d supply, fill #1

## 2024-05-12 MED ORDER — FREMANEZUMAB-VFRM 225 MG/1.5ML ~~LOC~~ SOSY: 225.0000 mg | PREFILLED_SYRINGE | Freq: Once | SUBCUTANEOUS | Status: AC

## 2024-05-12 NOTE — Progress Notes (Unsigned)
 [name] [name] [address] [place] [address] Phone: [phone]   Assessment and Plan:     There are no diagnoses linked to this encounter.  *** - Patient has received relief with OMT in the past.  Elects for repeat OMT today.  Tolerated well per note below. - Decision today to treat with OMT was based on Physical Exam   After verbal consent patient was treated with HVLA (high velocity low amplitude), ME (muscle energy), FPR (flex positional release), ST (soft tissue), PC/PD (Pelvic Compression/ Pelvic Decompression) techniques in cervical, rib, thoracic, lumbar, and pelvic areas. Patient tolerated the procedure well with improvement in symptoms.  Patient educated on potential side effects of soreness and recommended to rest, hydrate, and use Tylenol  as needed for pain control.   Pertinent previous records reviewed include ***    Follow Up: ***     Subjective:   I, Birdie Fetty, am serving as a Neurosurgeon for [employer]   Chief Complaint: neck pain    HPI:    08/07/2023 Patient states her low back and neck are flared and here for adjustment    09/04/2023 Patient states that her upper trap is tight , right shoulder tightness and pain, low back flare of tightness she isnt able to stand up straight    10/09/2023 Patient states that she is tight. Has been trying to do some stretches.    10/16/2023 Patient states neck  and shoulder flare    11/13/2023 Patient states thoracic and cervical tightness/stiffness     11/26/2023 Patient states anterior shoulder/ clavicle pain. Decreased ROM. Ibu doesn't help . 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 Heat helps. Pain for about 2 weeks. Would like to know if she can get a refill of fosamax     01/29/2024 Patient states right shoulder is still in pain,  the pain is deep. Neck is alright for the moment but can feel  it trying to pull    02/26/2024 Patient states neck is good , just here for an adjustment low back and hips are in pain    03/18/2024 Patient states right hip is in pain. So in turn left hip is in pain. Neck is alright. Shoulder is still flared   04/01/2024 Patient states injections for neck and down the bad.    04/15/2024 Patient states back is great. No pain in the neck. Just an adjustment today. Right hip is tight and left ankle feels like its compacting and she can feel the metal.   05/13/2024 Patient states   Relevant Historical Information: Migraines, GERD, history of Roux-en-Y gastric bypass Additional pertinent review of systems negative.  Current Outpatient Medications  Medication Sig Dispense Refill   Boric Acid 600 MG SUPP Place 600 mg vaginally at bedtime. For thirty days 30 suppository 0   tinidazole  (TINDAMAX ) 500 MG tablet Take 1 tablet (500 mg total) by mouth 2 (two) times daily for 7 days. For 5 Days. 14 tablet 0   acetaminophen  (TYLENOL ) 500 MG tablet Take by mouth.     acyclovir  ointment (ZOVIRAX ) 5 % Apply onto the affected area every 3 hours. 15 g 1   albuterol  (VENTOLIN  HFA) 108 (90 Base) MCG/ACT inhaler Inhale 1 - 2 puffs into the lungs every 6 hours as needed for wheezing or shortness of breath. 20.1 g 0   amitriptyline  (ELAVIL ) 10 MG tablet Take 1  tablet (10 mg total) by mouth at bedtime. 90 tablet 1   Azelastine -Fluticasone  (DYMISTA ) 137-50 MCG/ACT SUSP Place 2 sprays into both nostrils 2 (two) times daily as needed. 23 g 5   botulinum toxin Type A  (BOTOX ) 200 units injection Provider to inject 155 units into the muscles of the head and neck every 12 weeks. Discard remainder. 1 each 3   CALCIUM PO Take 1 tablet by mouth daily in the afternoon.     cetirizine  (ZYRTEC ) 10 MG tablet Take 2 tablets (20 mg total) by mouth 2 (two) times daily. 360 tablet 1   diazepam  (VALIUM ) 10 MG tablet Take 1 tablet (10 mg total) by mouth at bedtime as needed for sleep. 30 tablet 2    EPINEPHrine  0.3 mg/0.3 mL IJ SOAJ injection Inject 0.3 mg into the muscle as needed for anaphylaxis. 2 each 1   fluconazole  (DIFLUCAN ) 150 MG tablet Take 1 tablet (150 mg total) by mouth daily. 14 tablet 5   Fluticasone -Umeclidin-Vilant (TRELEGY ELLIPTA ) 200-62.5-25 MCG/ACT AEPB Inhale 1 puff into the lungs daily. 180 each 1   Galcanezumab -gnlm (EMGALITY ) 120 MG/ML SOAJ Inject 120 mg into the skin every 30 (thirty) days. 3 mL 3   Hypertonic Nasal Wash (SINUS RINSE REFILL) PACK Use one packet dissolved in water as needed. (Patient taking differently: Place 1 each into the nose in the morning and at bedtime.) 100 each 5   ibuprofen  (ADVIL ) 800 MG tablet Take 1 tablet (800 mg total) by mouth 3 (three) times daily as needed (pain.). 270 tablet 3   ipratropium (ATROVENT ) 0.06 % nasal spray Place 2 sprays into both nostrils 4 (four) times daily. 45 mL 5   linaclotide  (LINZESS ) 290 MCG CAPS capsule Take 1 capsule (290 mcg total) by mouth daily before breakfast. 90 capsule 1   meloxicam  (MOBIC ) 15 MG tablet Take 1 tablet (15 mg total) by mouth at bedtime as needed for pain. 90 tablet 3   nystatin  (MYCOSTATIN ) 100000 UNIT/ML suspension Take 5 mLs (500,000 Units total) by mouth 4 (four) times daily. Gargle and spit. 473 mL 0   ondansetron  (ZOFRAN -ODT) 4 MG disintegrating tablet Dissolve 1-2 tablets (4-8 mg total) by mouth every 8 (eight) hours as needed. May take with Rizatriptan  90 tablet 3   pantoprazole  (PROTONIX ) 40 MG tablet Take 1 tablet (40 mg total) by mouth at bedtime. 90 tablet 3   Pediatric Multiple Vit-C-FA (MULTIVITAMIN ANIMAL SHAPES, WITH CA/FA,) with C & FA chewable tablet Chew 2 tablets by mouth daily. "Bariactric advatage"     Rimegepant Sulfate  (NURTEC) 75 MG TBDP Take 1 tablet (75 mg total) by mouth daily as needed. 16 tablet 11   risedronate  (ACTONEL ) 150 MG tablet Take 1 tablet (150 mg total) by mouth every 30 (thirty) days. with water on empty stomach, nothing by mouth or lie down for next  30 minutes. 12 tablet 1   rizatriptan  (MAXALT -MLT) 10 MG disintegrating tablet Dissolve 1 tablet (10 mg total) by mouth as needed at onset of migraine. May take another tablet 2 hours later if needed, max 2 tablets in 24 hours. 27 tablet 3   simethicone  (MYLICON) 80 MG chewable tablet Chew 80 mg by mouth every 6 (six) hours as needed for flatulence.     tezepelumab -ekko (TEZSPIRE ) 210 MG/1. syringe Inject 1.91 mLs (210 mg total) into the skin every 28 (twenty-eight) days. 1.91 mL 11   tizanidine  (ZANAFLEX ) 6 MG capsule Take 1 capsule (6 mg total) by mouth at bedtime. 90 capsule 3  topiramate  (TOPAMAX ) 25 MG tablet Take 1 tablet (25 mg total) by mouth at bedtime. 30 tablet 6   triamcinolone  ointment (KENALOG ) 0.1 % Apply topically 2 (two) times daily. 90 g 0   valACYclovir  (VALTREX ) 500 MG tablet Take 2 tablets (1,000 mg total) by mouth 2 (two) times daily. 360 tablet 3   Vibegron  (GEMTESA ) 75 MG TABS Take 1 tablet every day by oral route for 90 days. 90 tablet 4   Current Facility-Administered Medications  Medication Dose Route Frequency Provider Last Rate Last Admin   tezepelumab -ekko (TEZSPIRE ) 210 MG/1. syringe 210 mg  210 mg Subcutaneous Q28 days [place] Chuck, MD   210 mg at 04/29/24 1317      Objective:     There were no vitals filed for this visit.    There is no height or weight on file to calculate BMI.    Physical Exam:     General: Well-appearing, cooperative, sitting comfortably in no acute distress.   OMT Physical Exam:  ASIS Compression Test: Positive Right Cervical: TTP paraspinal, *** Rib: Bilateral elevated first rib with TTP Thoracic: TTP paraspinal,*** Lumbar: TTP paraspinal,*** Pelvis: Right anterior innominate  Electronically signed by:  Marshall Skeeter [name] 7:45 AM 05/12/24

## 2024-05-12 NOTE — Progress Notes (Signed)
 [facility]    Provider:  Dr Tresia Fruit Requesting Provider: Alto Atta, NP Primary Care Provider:  Alto Atta, NP  CC:  migraines  05/12/2024: She is on emgality  and has a copay card. She has had a headache for a month. Has had a migraine for a month now unclear why. Maxalt  helped but keeps rebounding and the nurtec helped and the zofran  but has been coming back every day wakes with a headache. Has been doing well on botox  but they said her deductible is $300 for the botox  and can;t afford it.  Specialty pharmacy said her botox  was 1300 and the copay card covered 1000. SHe looked on her app that said she hadnt met hr deductible this year but feels she has and didn't have a copay in march. Advised to call her insurance. Medrol  dosepak helped in the past.  Try ajovy  instead of emgality . Has 8 migraine days a month and > 15 total headache days a month for > 3 months. Change emgality  to ajovy . Aimovig contraindicated due to constipation.   Patient complains of symptoms per HPI as well as the following symptoms: none . Pertinent negatives and positives per HPI. All others negative  Recent Botox  appointments  02/16/2024: May be moving to [place]. >50 % relief migraine and headache  freq and severity. Patient feels that her migraines cause her jaw to ache in her jaw aching also can cause her migraines to worsen it is a trigger for migraines included 10 units in each masseter to see if that helps with migraine severity.   10/27/2023: Stable. Feels jaw pain during migraines that is caused by migraine and worsens migraines, injected 10u in masseters bilat and states this has helped her migraines  08/04/2023: >50 % relief migraine and headache  freq and severity Tught muscles will give tizanidine  Discussed meloxicam , don;t use with ibuprofen  in the same class, watch kidney function Also watch for medication overuse taking too much ibuprofen  or tylenol  or triptans  HPI 06/14/2021:   Donna Park is a 53 y.o. female here as requested by Alto Atta, NP for migraine. Has had migraines since a little kid, extensive family history, her mother, brother, 3 of her sons all have migraines. She has headaches 25 days a month and 8 are moderatel to severe migraines that can last for 24-72 hours for over a year. Ibuprofen  and goody powders help but she may be taking them daily, too much. She has lost 145 pounds. Headaches start start unilateral but can also be bilateral, pulsating/pounding/throbbing, spreads to the whole head, light/sound sensitivity, nausea, zofran  to help stop vomiting, she can wake up with headaches. Imitrex  makes her skin very sensitive. She sometimes has an aura. She is having blurry vision and she has loss of vision a black spot. Headaches change and can be worse positionally with blurred vision and she can wake with them.Worsening in quality and severity.  No other focal neurologic deficits, associated symptoms, inciting events or modifiable factors.  Reviewed notes, labs and imaging from outside physicians, which showed:  From a thorough review of records, medications tried that ca be used in migraine management include: Amitriptyline (cannot tolerate), sumatriptan , ibuprofen , ondansetron , blood pressure medications contraindicated due to hypotension, zomig, topamax ,  02/2021: cbc unremarkable, cmp nml, tsh 2.21, hgba1c 5.4  Review of Systems: Patient complains of symptoms per HPI as well as the following symptoms headache. Pertinent negatives and positives per HPI. All others negative.   Social History   Socioeconomic History  Marital status: Married    Spouse name: Not on file   Number of children: 4   Years of education: Not on file   Highest education level: Associate degree: academic program  Occupational History   Not on file  Tobacco Use   Smoking status: Never   Smokeless tobacco: Never  Vaping Use   Vaping status: Never Used  Substance and  Sexual Activity   Alcohol use: Yes    Comment: Social/Rare   Drug use: Never   Sexual activity: Yes    Birth control/protection: Surgical  Other Topics Concern   Not on file  Social History Narrative   Pt works    Pt lives alone    Social Drivers of Health   Financial Resource Strain: Medium Risk (04/15/2024)   Overall Financial Resource Strain (CARDIA)    Difficulty of Paying Living Expenses: Somewhat hard  Food Insecurity: No Food Insecurity (04/15/2024)   Hunger Vital Sign    Worried About Running Out of Food in the Last Year: Never true    Ran Out of Food in the Last Year: Never true  Transportation Needs: No Transportation Needs (04/15/2024)   Received from [facility]   PRAPARE - Transportation    Lack of Transportation (Medical): No    Lack of Transportation (Non-Medical): No  Recent Concern: Transportation Needs - Unmet Transportation Needs (04/15/2024)   PRAPARE - Transportation    Lack of Transportation (Medical): Yes    Lack of Transportation (Non-Medical): Yes  Physical Activity: Insufficiently Active (04/15/2024)   Exercise Vital Sign    Days of Exercise per Week: 1 day    Minutes of Exercise per Session: 10 min  Stress: No Stress Concern Present (04/15/2024)   Received from [facility]   [employer] of Occupational Health - Occupational Stress Questionnaire    Feeling of Stress : Only a little  Recent Concern: Stress - Stress Concern Present (04/15/2024)   [employer] of Occupational Health - Occupational Stress Questionnaire    Feeling of Stress : Very much  Social Connections: Moderately Integrated (04/15/2024)   Social Connection and Isolation Panel [NHANES]    Frequency of Communication with Friends and Family: More than three times a week    Frequency of Social Gatherings with Friends and Family: Never    Attends Religious Services: More than 4 times per year    Active Member of [employer] or Organizations: Yes    Attends Tax inspector Meetings: 1 to 4 times per year    Marital Status: Separated  Intimate Partner Violence: Not on file    Family History  Problem Relation Age of Onset   COPD Mother    Heart disease Mother    Alcohol abuse Mother    Depression Mother    Drug abuse Mother    High Cholesterol Mother    High blood pressure Mother    Migraines Mother    COPD Father    Aneurysm Father    Heart disease Father    Early death Father    Diabetes Father    Drug abuse Father    AAA (abdominal aortic aneurysm) Father        cause of death at 6   Arthritis Sister    Kidney disease Sister    Crohn's disease Sister    Ulcerative colitis Sister    Thyroid  disease Sister    Migraines Sister    Kidney disease Brother    Kidney disease Maternal  Aunt    Diabetes Paternal Aunt    Asthma Neg Hx    Allergic rhinitis Neg Hx    Colon cancer Neg Hx    Rectal cancer Neg Hx    Stomach cancer Neg Hx     Past Medical History:  Diagnosis Date   Allergy    Anxiety    Arthritis    Asthma    Chicken pox    Complication of anesthesia    slow to wake up from anesthesia   Depression    DJD (degenerative joint disease)    GERD (gastroesophageal reflux disease)    resolved after gastric surgery   H/O gastric bypass 2016   History of iron deficiency anemia    HSV infection    Migraines    Obese    Pneumonia    Childhood history   PONV (postoperative nausea and vomiting)    Status post dilation of esophageal narrowing    Varicose vein of leg     Patient Active Problem List   Diagnosis Date Noted   Dysuria 08/14/2023   Dysphagia    LUQ pain 03/01/2022   Change in bowel habits 03/01/2022   Loss of weight 03/01/2022   Chronic migraine without aura without status migrainosus, not intractable 06/14/2021   S/P laparoscopic assisted vaginal hysterectomy (LAVH) 05/15/2020   Migraines    GERD (gastroesophageal reflux disease)    Asthma    Seasonal and perennial allergic rhinitis 02/10/2019    Moderate persistent asthma, uncomplicated 02/10/2019   Anaphylactic shock due to adverse food reaction 02/10/2019   Atopic dermatitis 02/10/2019   History of Roux-en-Y gastric bypass 2016    Past Surgical History:  Procedure Laterality Date   ANKLE FRACTURE SURGERY Left 03/08/2009   BREAST SURGERY Bilateral    Cyst Removal   ESOPHAGEAL MANOMETRY N/A 09/24/2022   Procedure: ESOPHAGEAL MANOMETRY (EM);  Surgeon: Annis Kinder, DO;  Location: [facility];  Service: Gastroenterology;  Laterality: N/A;   GASTRIC BYPASS  06/18/2015   LAPAROSCOPIC VAGINAL HYSTERECTOMY WITH SALPINGECTOMY Bilateral 05/15/2020   Procedure: LAPAROSCOPIC ASSISTED VAGINAL HYSTERECTOMY WITH SALPINGECTOMY;  Surgeon: Belle Box, MD;  Location: [facility];  Service: Gynecology;  Laterality: Bilateral;  need bed   LASER ABLATION     Varicose veins   TONSILLECTOMY AND ADENOIDECTOMY  2001   TUBAL LIGATION  12/19/1999   UPPER GASTROINTESTINAL ENDOSCOPY     UPPER GI ENDOSCOPY     Uterine ablation  12/2018    Current Outpatient Medications  Medication Sig Dispense Refill   acetaminophen  (TYLENOL ) 500 MG tablet Take by mouth.     acyclovir  ointment (ZOVIRAX ) 5 % Apply onto the affected area every 3 hours. 15 g 1   albuterol  (VENTOLIN  HFA) 108 (90 Base) MCG/ACT inhaler Inhale 1 - 2 puffs into the lungs every 6 hours as needed for wheezing or shortness of breath. 20.1 g 0   amitriptyline  (ELAVIL ) 10 MG tablet Take 1 tablet (10 mg total) by mouth at bedtime. 90 tablet 1   Azelastine -Fluticasone  (DYMISTA ) 137-50 MCG/ACT SUSP Place 2 sprays into both nostrils 2 (two) times daily as needed. 23 g 5   Boric Acid 600 MG SUPP Place 600 mg vaginally at bedtime. For thirty days 30 suppository 0   botulinum toxin Type A  (BOTOX ) 200 units injection Provider to inject 155 units into the muscles of the head and neck every 12 weeks. Discard remainder. 1 each 3   CALCIUM PO Take 1 tablet by mouth daily in  the  afternoon.     cetirizine  (ZYRTEC ) 10 MG tablet Take 2 tablets (20 mg total) by mouth 2 (two) times daily. 360 tablet 1   diazepam  (VALIUM ) 10 MG tablet Take 1 tablet (10 mg total) by mouth at bedtime as needed for sleep. 30 tablet 2   EPINEPHrine  0.3 mg/0.3 mL IJ SOAJ injection Inject 0.3 mg into the muscle as needed for anaphylaxis. 2 each 1   fluconazole  (DIFLUCAN ) 150 MG tablet Take 1 tablet (150 mg total) by mouth daily. 14 tablet 5   Fluticasone -Umeclidin-Vilant (TRELEGY ELLIPTA ) 200-62.5-25 MCG/ACT AEPB Inhale 1 puff into the lungs daily. 180 each 1   Fremanezumab -vfrm (AJOVY ) 225 MG/1.5ML SOAJ Inject 225 mg into the skin every 30 (thirty) days. Please run copay card: BIN# [id number] PCN# [id number] GRP# [id number] ID# [id number] 1.5 mL 11   Hypertonic Nasal Wash (SINUS RINSE REFILL) PACK Use one packet dissolved in water as needed. (Patient taking differently: Place 1 each into the nose in the morning and at bedtime.) 100 each 5   ibuprofen  (ADVIL ) 800 MG tablet Take 1 tablet (800 mg total) by mouth 3 (three) times daily as needed (pain.). 270 tablet 3   ipratropium (ATROVENT ) 0.06 % nasal spray Place 2 sprays into both nostrils 4 (four) times daily. 45 mL 5   linaclotide  (LINZESS ) 290 MCG CAPS capsule Take 1 capsule (290 mcg total) by mouth daily before breakfast. 90 capsule 1   meloxicam  (MOBIC ) 15 MG tablet Take 1 tablet (15 mg total) by mouth at bedtime as needed for pain. 90 tablet 3   methylPREDNISolone  (MEDROL  DOSEPAK) 4 MG TBPK tablet Take pills daily all together with food. Take the first dose (6 pills) as soon as possible. Take the rest each morning. For 6 days total 6-5-4-3-2-1. 21 tablet 1   nystatin  (MYCOSTATIN ) 100000 UNIT/ML suspension Take 5 mLs (500,000 Units total) by mouth 4 (four) times daily. Gargle and spit. 473 mL 0   ondansetron  (ZOFRAN -ODT) 4 MG disintegrating tablet Dissolve 1-2 tablets (4-8 mg total) by mouth every 8 (eight) hours as needed. May take with Rizatriptan  90 tablet 3    pantoprazole  (PROTONIX ) 40 MG tablet Take 1 tablet (40 mg total) by mouth at bedtime. 90 tablet 3   Pediatric Multiple Vit-C-FA (MULTIVITAMIN ANIMAL SHAPES, WITH CA/FA,) with C & FA chewable tablet Chew 2 tablets by mouth daily. "Bariactric advatage"     Rimegepant Sulfate  (NURTEC) 75 MG TBDP Take 1 tablet (75 mg total) by mouth daily as needed. 16 tablet 11   risedronate  (ACTONEL ) 150 MG tablet Take 1 tablet (150 mg total) by mouth every 30 (thirty) days. with water on empty stomach, nothing by mouth or lie down for next 30 minutes. 12 tablet 1   rizatriptan  (MAXALT -MLT) 10 MG disintegrating tablet Dissolve 1 tablet (10 mg total) by mouth as needed at onset of migraine. May take another tablet 2 hours later if needed, max 2 tablets in 24 hours. 27 tablet 3   simethicone  (MYLICON) 80 MG chewable tablet Chew 80 mg by mouth every 6 (six) hours as needed for flatulence.     tezepelumab -ekko (TEZSPIRE ) 210 MG/1. syringe Inject 1.91 mLs (210 mg total) into the skin every 28 (twenty-eight) days. 1.91 mL 11   tinidazole  (TINDAMAX ) 500 MG tablet Take 1 tablet (500 mg total) by mouth 2 (two) times daily for 7 days. For 5 Days. 14 tablet 0   tizanidine  (ZANAFLEX ) 6 MG capsule Take 1 capsule (6 mg total) by mouth at bedtime. 90  capsule 3   topiramate  (TOPAMAX ) 25 MG tablet Take 1 tablet (25 mg total) by mouth at bedtime. 30 tablet 6   triamcinolone  ointment (KENALOG ) 0.1 % Apply topically 2 (two) times daily. 90 g 0   valACYclovir  (VALTREX ) 500 MG tablet Take 2 tablets (1,000 mg total) by mouth 2 (two) times daily. 360 tablet 3   Vibegron  (GEMTESA ) 75 MG TABS Take 1 tablet every day by oral route for 90 days. 90 tablet 4   Current Facility-Administered Medications  Medication Dose Route Frequency Provider Last Rate Last Admin   Fremanezumab -vfrm SOSY 225 mg  225 mg Subcutaneous Once        tezepelumab -ekko (TEZSPIRE ) 210 MG/1. syringe 210 mg  210 mg Subcutaneous Q28 days Rochester Chuck, MD    210 mg at 04/29/24 1317    Allergies as of 05/12/2024 - Review Complete 05/12/2024  Allergen Reaction Noted   Cyclobenzaprine Shortness Of Breath and Other (See Comments) 07/09/2012   Misc. sulfonamide containing compounds Anaphylaxis 01/27/2023   Shrimp flavor agent (non-screening) Anaphylaxis 09/26/2021   Sulfa antibiotics Anaphylaxis, Hives, and Swelling 05/25/2007   Carisoprodol Hives 12/15/2012   Cat dander Itching 04/27/2014   Cephalexin  Other (See Comments) 01/27/2023   Metronidazole  07/03/2021   Morphine Other (See Comments) 06/18/2015   Pollen extract Itching 04/27/2014   Topiramate   07/03/2021   Adhesive [tape] Rash 04/05/2015   Tramadol Itching, Nausea And Vomiting, and Rash 09/26/2021    Vitals: BP 123/79   Pulse 82   Ht 5\' 6"  (1.676 m)   Wt 186 lb 6.4 oz (84.6 kg)   BMI 30.09 kg/m  Last Weight:  Wt Readings from Last 1 Encounters:  05/13/24 181 lb (82.1 kg)   Last Height:   Ht Readings from Last 1 Encounters:  05/13/24 5\' 6"  (1.676 m)   Exam: NAD, pleasant                  Speech:    Speech is normal; fluent and spontaneous with normal comprehension.  Cognition:    The patient is oriented to person, place, and time;     recent and remote memory intact;     language fluent;    Cranial Nerves:    The pupils are equal, round, and reactive to light.Trigeminal sensation is intact and the muscles of mastication are normal. The face is symmetric. The palate elevates in the midline. Hearing intact. Voice is normal. Shoulder shrug is normal. The tongue has normal motion without fasciculations.   Coordination:  No dysmetria  Motor Observation:    No asymmetry, no atrophy, and no involuntary movements noted. Tone:    Normal muscle tone.     Strength:    Strength is V/V in the upper and lower limbs.      Sensation: intact to LT     Assessment/Plan:  Patient with chronic migraines. Has been doing well on Emgality  and Botox  but having difficulty with  insurance for botox .   Change Emgality  to Ajovy  as prevention once monthly injection Recommend calling insurance for the issue described Continue Topiramate  and Amitriptyline  at bedtime as prevention Tizanidine  prn Acute/emergent: nurtec and Rizatriptan :  Nausea:zofran  Arthralgia/neck pain: meloxicam  prn bedtime Discussed:There is increased risk for stroke in women with migraine with aura and a contraindication for the combined contraceptive pill for use by women who have migraine with aura. The risk for women with migraine without aura is lower. However other risk factors like smoking are far more likely to increase stroke  risk than migraine. There is a recommendation for no smoking and for the use of OCPs without estrogen such as progestogen only pills particularly for women with migraine with aura.[name] People who have migraine headaches with auras may be 3 times more likely to have a stroke caused by a blood clot, compared to migraine patients who don't see auras. Women who take hormone-replacement therapy may be 30 percent more likely to suffer a clot-based stroke than women not taking medication containing estrogen. Other risk factors like smoking and high blood pressure may be  much more important.  No orders of the defined types were placed in this encounter.   Meds ordered this encounter  Medications   methylPREDNISolone  (MEDROL  DOSEPAK) 4 MG TBPK tablet    Sig: Take pills daily all together with food. Take the first dose (6 pills) as soon as possible. Take the rest each morning. For 6 days total 6-5-4-3-2-1.    Dispense:  21 tablet    Refill:  1   Fremanezumab -vfrm (AJOVY ) 225 MG/1.5ML SOAJ    Sig: Inject 225 mg into the skin every 30 (thirty) days. Please run copay card: BIN# [id number] PCN# [id number] GRP# [id number] ID# [id number]    Dispense:  1.5 mL    Refill:  11    Please run copay card: BIN# [id number] PCN# [id number] GRP# [id number] ID# [id number]   Fremanezumab -vfrm SOSY 225 mg    Cc: Nafziger,  Cory, NP,  Nafziger, Cory, NP  Aldona Amel, MD  [facility] [address] [place], [place] [address]  Phone [phone] Fax [phone]  I spent 35 minutes of face-to-face and non-face-to-face time with patient on the  1. Status migrainosus   2. Chronic migraine without aura without status migrainosus, not intractable    diagnosis.  This included previsit chart review, lab review, study review, order entry, electronic health record documentation, patient education on the different diagnostic and therapeutic options, counseling and coordination of care, risks and benefits of management, compliance, or risk factor reduction

## 2024-05-13 ENCOUNTER — Ambulatory Visit: Admitting: Sports Medicine

## 2024-05-13 ENCOUNTER — Other Ambulatory Visit (HOSPITAL_BASED_OUTPATIENT_CLINIC_OR_DEPARTMENT_OTHER): Payer: Self-pay

## 2024-05-13 ENCOUNTER — Telehealth: Payer: Self-pay

## 2024-05-13 ENCOUNTER — Other Ambulatory Visit: Payer: Self-pay

## 2024-05-13 ENCOUNTER — Other Ambulatory Visit (HOSPITAL_COMMUNITY): Payer: Self-pay

## 2024-05-13 VITALS — BP 124/80 | HR 65 | Ht 66.0 in | Wt 181.0 lb

## 2024-05-13 DIAGNOSIS — M542 Cervicalgia: Secondary | ICD-10-CM

## 2024-05-13 DIAGNOSIS — G8929 Other chronic pain: Secondary | ICD-10-CM

## 2024-05-13 DIAGNOSIS — M9901 Segmental and somatic dysfunction of cervical region: Secondary | ICD-10-CM | POA: Diagnosis not present

## 2024-05-13 DIAGNOSIS — M9903 Segmental and somatic dysfunction of lumbar region: Secondary | ICD-10-CM

## 2024-05-13 DIAGNOSIS — M9902 Segmental and somatic dysfunction of thoracic region: Secondary | ICD-10-CM | POA: Diagnosis not present

## 2024-05-13 DIAGNOSIS — M546 Pain in thoracic spine: Secondary | ICD-10-CM | POA: Diagnosis not present

## 2024-05-13 DIAGNOSIS — M9905 Segmental and somatic dysfunction of pelvic region: Secondary | ICD-10-CM

## 2024-05-13 DIAGNOSIS — M9908 Segmental and somatic dysfunction of rib cage: Secondary | ICD-10-CM

## 2024-05-13 DIAGNOSIS — M545 Low back pain, unspecified: Secondary | ICD-10-CM

## 2024-05-13 NOTE — Telephone Encounter (Signed)
 Thank you.Donna AasAaron AasAaron AasI'm just blind

## 2024-05-13 NOTE — Telephone Encounter (Signed)
 Refill calls seem to be off for Tezspire - next appt 06/20 but next refill call set for July

## 2024-05-20 ENCOUNTER — Other Ambulatory Visit: Payer: Self-pay

## 2024-05-23 ENCOUNTER — Other Ambulatory Visit: Payer: Self-pay

## 2024-05-24 ENCOUNTER — Telehealth: Payer: Self-pay | Admitting: Allergy & Immunology

## 2024-05-24 NOTE — Telephone Encounter (Signed)
 Left voicemail to give the office a call back to schedule Tezspire reapproval appointment.

## 2024-05-26 NOTE — Progress Notes (Unsigned)
 [name] [name] [address] [place] [address] Phone: [phone]   Assessment and Plan:     There are no diagnoses linked to this encounter.  *** - Patient has received relief with OMT in the past.  Elects for repeat OMT today.  Tolerated well per note below. - Decision today to treat with OMT was based on Physical Exam   After verbal consent patient was treated with HVLA (high velocity low amplitude), ME (muscle energy), FPR (flex positional release), ST (soft tissue), PC/PD (Pelvic Compression/ Pelvic Decompression) techniques in cervical, rib, thoracic, lumbar, and pelvic areas. Patient tolerated the procedure well with improvement in symptoms.  Patient educated on potential side effects of soreness and recommended to rest, hydrate, and use Tylenol  as needed for pain control.   Pertinent previous records reviewed include ***    Follow Up: ***     Subjective:   I, Donna Park, am serving as a Neurosurgeon for Doctor [employer]   Chief Complaint: neck pain    HPI:    08/07/2023 Patient states her low back and neck are flared and here for adjustment    09/04/2023 Patient states that her upper trap is tight , right shoulder tightness and pain, low back flare of tightness she isnt able to stand up straight    10/09/2023 Patient states that she is tight. Has been trying to do some stretches.    10/16/2023 Patient states neck  and shoulder flare    11/13/2023 Patient states thoracic and cervical tightness/stiffness     11/26/2023 Patient states anterior shoulder/ clavicle pain. Decreased ROM. Ibu doesn't help . 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 Heat helps. Pain for about 2 weeks. Would like to know if she can get a refill of fosamax     01/29/2024 Patient states right shoulder is still in pain,  the pain is deep. Neck is alright for the moment but can feel  it trying to pull    02/26/2024 Patient states neck is good , just here for an adjustment low back and hips are in pain    03/18/2024 Patient states right hip is in pain. So in turn left hip is in pain. Neck is alright. Shoulder is still flared   04/01/2024 Patient states injections for neck and down the bad.    04/15/2024 Patient states back is great. No pain in the neck. Just an adjustment today. Right hip is tight and left ankle feels like its compacting and she can feel the metal.    05/13/2024 Patient states ready for an adjustment. Low back is really tight   05/27/2024 Patient states   Relevant Historical Information: Migraines, GERD, history of Roux-en-Y gastric bypass Additional pertinent review of systems negative.  Additional pertinent review of systems negative.  Current Outpatient Medications  Medication Sig Dispense Refill   acetaminophen  (TYLENOL ) 500 MG tablet Take by mouth.     acyclovir  ointment (ZOVIRAX ) 5 % Apply onto the affected area every 3 hours. 15 g 1   albuterol  (VENTOLIN  HFA) 108 (90 Base) MCG/ACT inhaler Inhale 1 - 2 puffs into the lungs every 6 hours as needed for wheezing or shortness of breath. 20.1 g 0   amitriptyline  (ELAVIL ) 10 MG tablet Take 1 tablet (10 mg total) by mouth at bedtime. 90 tablet 1   Azelastine -Fluticasone  (DYMISTA ) 137-50 MCG/ACT SUSP Place 2 sprays into both nostrils 2 (  two) times daily as needed. 23 g 5   Boric Acid 600 MG SUPP Place 600 mg vaginally at bedtime. For thirty days 30 suppository 0   botulinum toxin Type A  (BOTOX ) 200 units injection Provider to inject 155 units into the muscles of the head and neck every 12 weeks. Discard remainder. 1 each 3   CALCIUM PO Take 1 tablet by mouth daily in the afternoon.     cetirizine  (ZYRTEC ) 10 MG tablet Take 2 tablets (20 mg total) by mouth 2 (two) times daily. 360 tablet 1   diazepam  (VALIUM ) 10 MG tablet Take 1 tablet (10 mg total) by mouth at bedtime as needed for sleep. 30 tablet 2    EPINEPHrine  0.3 mg/0.3 mL IJ SOAJ injection Inject 0.3 mg into the muscle as needed for anaphylaxis. 2 each 1   fluconazole  (DIFLUCAN ) 150 MG tablet Take 1 tablet (150 mg total) by mouth daily. 14 tablet 5   Fluticasone -Umeclidin-Vilant (TRELEGY ELLIPTA ) 200-62.5-25 MCG/ACT AEPB Inhale 1 puff into the lungs daily. 180 each 1   Fremanezumab -vfrm (AJOVY ) 225 MG/1.5ML SOAJ Inject 225 mg into the skin every 30 (thirty) days. Please run copay card: BIN# [id number] PCN# [id number] GRP# [id number] ID# [id number] 1.5 mL 11   Hypertonic Nasal Wash (SINUS RINSE REFILL) PACK Use one packet dissolved in water as needed. (Patient taking differently: Place 1 each into the nose in the morning and at bedtime.) 100 each 5   ibuprofen  (ADVIL ) 800 MG tablet Take 1 tablet (800 mg total) by mouth 3 (three) times daily as needed (pain.). 270 tablet 3   ipratropium (ATROVENT ) 0.06 % nasal spray Place 2 sprays into both nostrils 4 (four) times daily. 45 mL 5   linaclotide  (LINZESS ) 290 MCG CAPS capsule Take 1 capsule (290 mcg total) by mouth daily before breakfast. 90 capsule 1   meloxicam  (MOBIC ) 15 MG tablet Take 1 tablet (15 mg total) by mouth at bedtime as needed for pain. 90 tablet 3   methylPREDNISolone  (MEDROL  DOSEPAK) 4 MG TBPK tablet Take pills daily all together with food. Take the first dose (6 pills) as soon as possible. Take the rest each morning. For 6 days total 6-5-4-3-2-1. 21 tablet 1   nystatin  (MYCOSTATIN ) 100000 UNIT/ML suspension Take 5 mLs (500,000 Units total) by mouth 4 (four) times daily. Gargle and spit. 473 mL 0   ondansetron  (ZOFRAN -ODT) 4 MG disintegrating tablet Dissolve 1-2 tablets (4-8 mg total) by mouth every 8 (eight) hours as needed. May take with Rizatriptan  90 tablet 3   pantoprazole  (PROTONIX ) 40 MG tablet Take 1 tablet (40 mg total) by mouth at bedtime. 90 tablet 3   Pediatric Multiple Vit-C-FA (MULTIVITAMIN ANIMAL SHAPES, WITH CA/FA,) with C & FA chewable tablet Chew 2 tablets by mouth daily.  Bariactric advatage     Rimegepant Sulfate  (NURTEC) 75 MG TBDP Take 1 tablet (75 mg total) by mouth daily as needed. 16 tablet 11   risedronate  (ACTONEL ) 150 MG tablet Take 1 tablet (150 mg total) by mouth every 30 (thirty) days. with water on empty stomach, nothing by mouth or lie down for next 30 minutes. 12 tablet 1   rizatriptan  (MAXALT -MLT) 10 MG disintegrating tablet Dissolve 1 tablet (10 mg total) by mouth as needed at onset of migraine. May take another tablet 2 hours later if needed, max 2 tablets in 24 hours. 27 tablet 3   simethicone  (MYLICON) 80 MG chewable tablet Chew 80 mg by mouth every 6 (six) hours as needed for flatulence.  tezepelumab -ekko (TEZSPIRE ) 210 MG/1. syringe Inject 1.91 mLs (210 mg total) into the skin every 28 (twenty-eight) days. 1.91 mL 11   tizanidine  (ZANAFLEX ) 6 MG capsule Take 1 capsule (6 mg total) by mouth at bedtime. 90 capsule 3   topiramate  (TOPAMAX ) 25 MG tablet Take 1 tablet (25 mg total) by mouth at bedtime. 30 tablet 6   triamcinolone  ointment (KENALOG ) 0.1 % Apply topically 2 (two) times daily. 90 g 0   valACYclovir  (VALTREX ) 500 MG tablet Take 2 tablets (1,000 mg total) by mouth 2 (two) times daily. 360 tablet 3   Vibegron  (GEMTESA ) 75 MG TABS Take 1 tablet every day by oral route for 90 days. 90 tablet 4   Current Facility-Administered Medications  Medication Dose Route Frequency Provider Last Rate Last Admin   Fremanezumab -vfrm SOSY 225 mg  225 mg Subcutaneous Once        tezepelumab -ekko (TEZSPIRE ) 210 MG/1. syringe 210 mg  210 mg Subcutaneous Q28 days Rochester Chuck, MD   210 mg at 04/29/24 1317      Objective:     There were no vitals filed for this visit.    There is no height or weight on file to calculate BMI.    Physical Exam:     General: Well-appearing, cooperative, sitting comfortably in no acute distress.   OMT Physical Exam:  ASIS Compression Test: Positive Right Cervical: TTP paraspinal, *** Rib:  Bilateral elevated first rib with TTP Thoracic: TTP paraspinal,*** Lumbar: TTP paraspinal,*** Pelvis: Right anterior innominate  Electronically signed by:  Marshall Skeeter [name] 7:46 AM 05/26/24

## 2024-05-27 ENCOUNTER — Ambulatory Visit (INDEPENDENT_AMBULATORY_CARE_PROVIDER_SITE_OTHER): Admitting: Sports Medicine

## 2024-05-27 ENCOUNTER — Ambulatory Visit

## 2024-05-27 VITALS — HR 105 | Ht 66.0 in | Wt 181.0 lb

## 2024-05-27 DIAGNOSIS — M9902 Segmental and somatic dysfunction of thoracic region: Secondary | ICD-10-CM | POA: Diagnosis not present

## 2024-05-27 DIAGNOSIS — G8929 Other chronic pain: Secondary | ICD-10-CM | POA: Diagnosis not present

## 2024-05-27 DIAGNOSIS — M9905 Segmental and somatic dysfunction of pelvic region: Secondary | ICD-10-CM

## 2024-05-27 DIAGNOSIS — M9901 Segmental and somatic dysfunction of cervical region: Secondary | ICD-10-CM

## 2024-05-27 DIAGNOSIS — M545 Low back pain, unspecified: Secondary | ICD-10-CM

## 2024-05-27 DIAGNOSIS — M9908 Segmental and somatic dysfunction of rib cage: Secondary | ICD-10-CM

## 2024-05-27 DIAGNOSIS — M542 Cervicalgia: Secondary | ICD-10-CM

## 2024-05-27 DIAGNOSIS — J4550 Severe persistent asthma, uncomplicated: Secondary | ICD-10-CM | POA: Diagnosis not present

## 2024-05-27 DIAGNOSIS — M9903 Segmental and somatic dysfunction of lumbar region: Secondary | ICD-10-CM

## 2024-06-14 ENCOUNTER — Other Ambulatory Visit: Payer: Self-pay

## 2024-06-14 ENCOUNTER — Ambulatory Visit: Admitting: Allergy & Immunology

## 2024-06-14 NOTE — Progress Notes (Signed)
 Specialty Pharmacy Refill Coordination Note  Donna Park is a 53 y.o. female assessed today regarding refills of clinic administered specialty medication(s) Tezepelumab -ekko (Tezspire )   Clinic requested Courier to Provider Office   Delivery date: 06/20/24   Verified address: [facility] [address]   Medication will be filled on 06/17/24.

## 2024-06-16 ENCOUNTER — Other Ambulatory Visit: Payer: Self-pay

## 2024-06-16 NOTE — Progress Notes (Signed)
 [facility] [address] [place] [address] Phone: [phone]   Assessment and Plan:     1. Neck pain 2. Chronic bilateral low back pain without sciatica 3. Somatic dysfunction of cervical region 4. Somatic dysfunction of thoracic region 5. Somatic dysfunction of lumbar region 6. Somatic dysfunction of pelvic region 7. Somatic dysfunction of rib region  - Chronic with exacerbation, subsequent visit - Recurrence of neck and lower back pain.  Overall receiving improvement with HEP, OMT - Use Tylenol  500 to 1000 mg tablets 2-3 times a day for day-to-day pain relief - Use meloxicam  15 mg daily as needed for pain.  Recommend limiting chronic NSAIDs to 1-2 doses per week to prevent long-term side effects. - Patient believes that hold an uncomfortable chair at work is contributing to chronic pain.  Recommend getting an ergonomic and comfortable chair to decrease musculoskeletal pain and decrease time out of work.  Note provided - Patient has received relief with OMT in the past.  Elects for repeat OMT today.  Tolerated well per note below. - Decision today to treat with OMT was based on Physical Exam   After verbal consent patient was treated with HVLA (high velocity low amplitude), ME (muscle energy), FPR (flex positional release), ST (soft tissue), PC/PD (Pelvic Compression/ Pelvic Decompression) techniques in cervical, rib, thoracic, lumbar, and pelvic areas. Patient tolerated the procedure well with improvement in symptoms.  Patient educated on potential side effects of soreness and recommended to rest, hydrate, and use Tylenol  as needed for pain control.   Pertinent previous records reviewed include none  Follow Up: 4 weeks for reevaluation.  Could consider repeat OMT versus trigger point injections   Subjective:   I, Donna Park, am serving as a Neurosurgeon for Doctor [employer]   Chief Complaint: neck pain    HPI:     08/07/2023 Patient states her low back and neck are flared and here for adjustment    09/04/2023 Patient states that her upper trap is tight , right shoulder tightness and pain, low back flare of tightness she isnt able to stand up straight    10/09/2023 Patient states that she is tight. Has been trying to do some stretches.    10/16/2023 Patient states neck  and shoulder flare    11/13/2023 Patient states thoracic and cervical tightness/stiffness     11/26/2023 Patient states anterior shoulder/ clavicle pain. Decreased ROM. Ibu doesn't help . 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 Heat helps. Pain for about 2 weeks. Would like to know if she can get a refill of fosamax     01/29/2024 Patient states right shoulder is still in pain,  the pain is deep. Neck is alright for the moment but can feel it trying to pull    02/26/2024 Patient states neck is good , just here for an adjustment low back and hips are in pain    03/18/2024 Patient states right hip is in pain. So in turn left hip is in pain. Neck is alright. Shoulder is still flared   04/01/2024 Patient states injections for neck and down the bad.    04/15/2024 Patient states back is great. No pain in the neck. Just an adjustment today. Right hip is tight and left ankle feels like its compacting and she can feel the metal.    05/13/2024 Patient states ready for an adjustment. Low back  is really tight    05/27/2024 Patient states back and shoulders are tight   06/17/2024 Patient states low back and neck flare  Relevant Historical Information: Migraines, GERD, history of Roux-en-Y gastric bypass  Additional pertinent review of systems negative.  Current Outpatient Medications  Medication Sig Dispense Refill   acetaminophen  (TYLENOL ) 500 MG tablet Take by mouth.     acyclovir  ointment (ZOVIRAX ) 5 % Apply onto the affected area every 3 hours. 15 g 1   albuterol   (VENTOLIN  HFA) 108 (90 Base) MCG/ACT inhaler Inhale 1 - 2 puffs into the lungs every 6 hours as needed for wheezing or shortness of breath. 20.1 g 0   amitriptyline  (ELAVIL ) 10 MG tablet Take 1 tablet (10 mg total) by mouth at bedtime. 90 tablet 1   Azelastine -Fluticasone  (DYMISTA ) 137-50 MCG/ACT SUSP Place 2 sprays into both nostrils 2 (two) times daily as needed. 23 g 5   botulinum toxin Type A  (BOTOX ) 200 units injection Provider to inject 155 units into the muscles of the head and neck every 12 weeks. Discard remainder. 1 each 3   CALCIUM PO Take 1 tablet by mouth daily in the afternoon.     cetirizine  (ZYRTEC ) 10 MG tablet Take 2 tablets (20 mg total) by mouth 2 (two) times daily. 360 tablet 1   diazepam  (VALIUM ) 10 MG tablet Take 1 tablet (10 mg total) by mouth at bedtime as needed for sleep. 30 tablet 2   EPINEPHrine  0.3 mg/0.3 mL IJ SOAJ injection Inject 0.3 mg into the muscle as needed for anaphylaxis. 2 each 1   fluconazole  (DIFLUCAN ) 150 MG tablet Take 1 tablet (150 mg total) by mouth daily. 14 tablet 5   Fluticasone -Umeclidin-Vilant (TRELEGY ELLIPTA ) 200-62.5-25 MCG/ACT AEPB Inhale 1 puff into the lungs daily. 180 each 1   Fremanezumab -vfrm (AJOVY ) 225 MG/1.5ML SOAJ Inject 225 mg into the skin every 30 (thirty) days. 1.5 mL 11   Hypertonic Nasal Wash (SINUS RINSE REFILL) PACK Use one packet dissolved in water as needed. (Patient taking differently: Place 1 each into the nose in the morning and at bedtime.) 100 each 5   ibuprofen  (ADVIL ) 800 MG tablet Take 1 tablet (800 mg total) by mouth 3 (three) times daily as needed (pain.). 270 tablet 3   ipratropium (ATROVENT ) 0.06 % nasal spray Place 2 sprays into both nostrils 4 (four) times daily. 45 mL 5   linaclotide  (LINZESS ) 290 MCG CAPS capsule Take 1 capsule (290 mcg total) by mouth daily before breakfast. 90 capsule 1   meloxicam  (MOBIC ) 15 MG tablet Take 1 tablet (15 mg total) by mouth at bedtime as needed for pain. 90 tablet 3    methylPREDNISolone  (MEDROL  DOSEPAK) 4 MG TBPK tablet Take pills daily all together with food. Take the first dose (6 pills) as soon as possible. Take the rest each morning. For 6 days total 6-5-4-3-2-1. 21 tablet 1   nystatin  (MYCOSTATIN ) 100000 UNIT/ML suspension Take 5 mLs (500,000 Units total) by mouth 4 (four) times daily. Gargle and spit. 473 mL 0   ondansetron  (ZOFRAN -ODT) 4 MG disintegrating tablet Dissolve 1-2 tablets (4-8 mg total) by mouth every 8 (eight) hours as needed. May take with Rizatriptan  90 tablet 3   pantoprazole  (PROTONIX ) 40 MG tablet Take 1 tablet (40 mg total) by mouth at bedtime. 90 tablet 3   Pediatric Multiple Vit-C-FA (MULTIVITAMIN ANIMAL SHAPES, WITH CA/FA,) with C & FA chewable tablet Chew 2 tablets by mouth daily. Bariactric advatage     Rimegepant Sulfate  (NURTEC) 75  MG TBDP Take 1 tablet (75 mg total) by mouth daily as needed. 16 tablet 11   risedronate  (ACTONEL ) 150 MG tablet Take 1 tablet (150 mg total) by mouth every 30 (thirty) days. with water on empty stomach, nothing by mouth or lie down for next 30 minutes. 12 tablet 1   rizatriptan  (MAXALT -MLT) 10 MG disintegrating tablet Dissolve 1 tablet (10 mg total) by mouth as needed at onset of migraine. May take another tablet 2 hours later if needed, max 2 tablets in 24 hours. 27 tablet 3   simethicone  (MYLICON) 80 MG chewable tablet Chew 80 mg by mouth every 6 (six) hours as needed for flatulence.     tezepelumab -ekko (TEZSPIRE ) 210 MG/1. syringe Inject 1.91 mLs (210 mg total) into the skin every 28 (twenty-eight) days. 1.91 mL 11   tizanidine  (ZANAFLEX ) 6 MG capsule Take 1 capsule (6 mg total) by mouth at bedtime. 90 capsule 3   topiramate  (TOPAMAX ) 25 MG tablet Take 1 tablet (25 mg total) by mouth at bedtime. 30 tablet 6   triamcinolone  ointment (KENALOG ) 0.1 % Apply topically 2 (two) times daily. 90 g 0   valACYclovir  (VALTREX ) 500 MG tablet Take 2 tablets (1,000 mg total) by mouth 2 (two) times daily. 360  tablet 3   Vibegron  (GEMTESA ) 75 MG TABS Take 1 tablet every day by oral route for 90 days. 90 tablet 4   Current Facility-Administered Medications  Medication Dose Route Frequency Provider Last Rate Last Admin   Fremanezumab -vfrm SOSY 225 mg  225 mg Subcutaneous Once        tezepelumab -ekko (TEZSPIRE ) 210 MG/1. syringe 210 mg  210 mg Subcutaneous Q28 days Iva Marty Saltness, MD   210 mg at 05/27/24 1406      Objective:     Vitals:   06/17/24 1035  BP: 122/82  Pulse: (!) 104  SpO2: 99%  Weight: 181 lb (82.1 kg)  Height: 5' 6 (1.676 m)      Body mass index is 29.21 kg/m.    Physical Exam:     General: Well-appearing, cooperative, sitting comfortably in no acute distress.   OMT Physical Exam:  ASIS Compression Test: Positive Right Cervical: TTP paraspinal, C3-5 RLSL Rib: Bilateral elevated first rib with TTP Thoracic: TTP paraspinal, T4 RRSR Lumbar: TTP paraspinal, L1-3 RRSL Pelvis: Right anterior innominate  Electronically signed by:  Odis Mace [facility] 10:54 AM 06/17/24

## 2024-06-17 ENCOUNTER — Other Ambulatory Visit: Payer: Self-pay

## 2024-06-17 ENCOUNTER — Other Ambulatory Visit (HOSPITAL_COMMUNITY): Payer: Self-pay

## 2024-06-17 ENCOUNTER — Other Ambulatory Visit: Payer: Self-pay | Admitting: Neurology

## 2024-06-17 ENCOUNTER — Other Ambulatory Visit: Payer: Self-pay | Admitting: Allergy & Immunology

## 2024-06-17 ENCOUNTER — Other Ambulatory Visit: Payer: Self-pay | Admitting: Gastroenterology

## 2024-06-17 ENCOUNTER — Other Ambulatory Visit: Payer: Self-pay | Admitting: Adult Health

## 2024-06-17 ENCOUNTER — Ambulatory Visit (INDEPENDENT_AMBULATORY_CARE_PROVIDER_SITE_OTHER): Admitting: Sports Medicine

## 2024-06-17 VITALS — BP 122/82 | HR 104 | Ht 66.0 in | Wt 181.0 lb

## 2024-06-17 DIAGNOSIS — M9905 Segmental and somatic dysfunction of pelvic region: Secondary | ICD-10-CM | POA: Diagnosis not present

## 2024-06-17 DIAGNOSIS — M9901 Segmental and somatic dysfunction of cervical region: Secondary | ICD-10-CM | POA: Diagnosis not present

## 2024-06-17 DIAGNOSIS — M542 Cervicalgia: Secondary | ICD-10-CM

## 2024-06-17 DIAGNOSIS — M545 Low back pain, unspecified: Secondary | ICD-10-CM

## 2024-06-17 DIAGNOSIS — M9903 Segmental and somatic dysfunction of lumbar region: Secondary | ICD-10-CM | POA: Diagnosis not present

## 2024-06-17 DIAGNOSIS — G8929 Other chronic pain: Secondary | ICD-10-CM

## 2024-06-17 DIAGNOSIS — M9902 Segmental and somatic dysfunction of thoracic region: Secondary | ICD-10-CM

## 2024-06-17 DIAGNOSIS — G43009 Migraine without aura, not intractable, without status migrainosus: Secondary | ICD-10-CM

## 2024-06-17 DIAGNOSIS — M9908 Segmental and somatic dysfunction of rib cage: Secondary | ICD-10-CM

## 2024-06-17 MED ORDER — AZELASTINE-FLUTICASONE 137-50 MCG/ACT NA SUSP
2.0000 | Freq: Two times a day (BID) | NASAL | 1 refills | Status: DC | PRN
  Filled 2024-06-17 – 2024-07-01 (×2): qty 23, 30d supply, fill #0

## 2024-06-17 MED ORDER — ALBUTEROL SULFATE HFA 108 (90 BASE) MCG/ACT IN AERS
1.0000 | INHALATION_SPRAY | Freq: Four times a day (QID) | RESPIRATORY_TRACT | 0 refills | Status: DC | PRN
  Filled 2024-06-17: qty 20.1, 75d supply, fill #0

## 2024-06-17 MED ORDER — LINACLOTIDE 290 MCG PO CAPS
290.0000 ug | ORAL_CAPSULE | Freq: Every day | ORAL | 0 refills | Status: DC
  Filled 2024-06-17 – 2024-07-01 (×2): qty 30, 30d supply, fill #0

## 2024-06-17 NOTE — Progress Notes (Signed)
 Specialty Pharmacy Refill Coordination Note  Donna Park is a 53 y.o. female contacted today regarding refills of specialty medication(s) OnabotulinumtoxinA  (Botox )   Patient requested Courier to Provider Office   Delivery date: 06/20/24   Verified address: [facility] [address]  [place],  [place] [address]   Medication will be filled on 06/17/24.

## 2024-06-17 NOTE — Patient Instructions (Signed)
 Recommend an ergonomic and comfortable  chair, to decrease musculoskeletal pain and time time out of work  4 week follow up

## 2024-06-20 ENCOUNTER — Other Ambulatory Visit (HOSPITAL_COMMUNITY): Payer: Self-pay

## 2024-06-20 ENCOUNTER — Telehealth: Payer: Self-pay | Admitting: Neurology

## 2024-06-20 NOTE — Telephone Encounter (Signed)
 We received Botox  delivery from [facility]. Pt still has a large balance with our office. Sent MyChart msg asking her to call and speak with billing before we can schedule an appointment.

## 2024-06-21 ENCOUNTER — Other Ambulatory Visit: Payer: Self-pay

## 2024-06-21 ENCOUNTER — Other Ambulatory Visit (HOSPITAL_COMMUNITY): Payer: Self-pay

## 2024-06-21 MED ORDER — ONDANSETRON 4 MG PO TBDP
4.0000 mg | ORAL_TABLET | Freq: Three times a day (TID) | ORAL | 3 refills | Status: AC | PRN
  Filled 2024-06-21 – 2024-09-23 (×2): qty 90, 15d supply, fill #0
  Filled 2024-12-05: qty 90, 15d supply, fill #1

## 2024-06-21 MED ORDER — DIAZEPAM 10 MG PO TABS
10.0000 mg | ORAL_TABLET | Freq: Every evening | ORAL | 2 refills | Status: DC | PRN
  Filled 2024-06-21 – 2024-07-27 (×4): qty 30, 30d supply, fill #0
  Filled 2024-09-23: qty 30, 30d supply, fill #1
  Filled 2024-11-17: qty 30, 30d supply, fill #2

## 2024-06-21 MED ORDER — RIZATRIPTAN BENZOATE 10 MG PO TBDP
10.0000 mg | ORAL_TABLET | ORAL | 3 refills | Status: AC | PRN
  Filled 2024-06-21 – 2024-09-23 (×2): qty 27, 90d supply, fill #0
  Filled 2024-12-05 – 2024-12-10 (×2): qty 27, 90d supply, fill #1

## 2024-06-21 MED ORDER — NURTEC 75 MG PO TBDP
75.0000 mg | ORAL_TABLET | Freq: Every day | ORAL | 11 refills | Status: AC | PRN
  Filled 2024-06-21: qty 16, 30d supply, fill #0
  Filled 2024-09-23: qty 16, 26d supply, fill #0
  Filled 2024-12-05: qty 16, 30d supply, fill #1

## 2024-06-22 ENCOUNTER — Telehealth (HOSPITAL_COMMUNITY): Payer: Self-pay | Admitting: Pharmacy Technician

## 2024-06-22 ENCOUNTER — Other Ambulatory Visit: Payer: Self-pay

## 2024-06-22 ENCOUNTER — Other Ambulatory Visit (HOSPITAL_COMMUNITY): Payer: Self-pay

## 2024-06-23 ENCOUNTER — Other Ambulatory Visit: Payer: Self-pay

## 2024-06-23 NOTE — Telephone Encounter (Signed)
 Pt has spoken with billing and appt is set for 07/21/24. Submitted auth renewal request via CMM, status is pending. Key: [id number]

## 2024-06-24 ENCOUNTER — Ambulatory Visit

## 2024-06-24 DIAGNOSIS — J4550 Severe persistent asthma, uncomplicated: Secondary | ICD-10-CM | POA: Diagnosis not present

## 2024-06-27 NOTE — Telephone Encounter (Signed)
 Received approval, pt will continue to fill through [facility].  Auth#: [id number] (07/20/24-07/19/25)

## 2024-06-28 ENCOUNTER — Other Ambulatory Visit: Payer: Self-pay

## 2024-06-30 ENCOUNTER — Telehealth (HOSPITAL_COMMUNITY): Payer: Self-pay

## 2024-06-30 ENCOUNTER — Other Ambulatory Visit (HOSPITAL_COMMUNITY): Payer: Self-pay

## 2024-07-01 ENCOUNTER — Other Ambulatory Visit: Payer: Self-pay | Admitting: Gastroenterology

## 2024-07-01 ENCOUNTER — Other Ambulatory Visit (HOSPITAL_COMMUNITY): Payer: Self-pay

## 2024-07-01 ENCOUNTER — Other Ambulatory Visit: Payer: Self-pay | Admitting: Adult Health

## 2024-07-01 ENCOUNTER — Other Ambulatory Visit: Payer: Self-pay

## 2024-07-01 NOTE — Telephone Encounter (Signed)
 Pt requests for a 90 day supply. Please advise

## 2024-07-04 ENCOUNTER — Telehealth: Payer: Self-pay

## 2024-07-04 ENCOUNTER — Other Ambulatory Visit (HOSPITAL_COMMUNITY): Payer: Self-pay

## 2024-07-04 NOTE — Telephone Encounter (Signed)
 Pharmacy Patient Advocate Encounter  Received notification from [facility] that Prior Authorization for Azelastine -Fluticasone  137-50MCG/ACT suspension  has been APPROVED from 07/04/2024 to 07/03/2025. Ran test claim, Copay is $71.91. This test claim was processed through [facility]- copay amounts may vary at other pharmacies due to pharmacy/plan contracts, or as the patient moves through the different stages of their insurance plan.   PA #/Case ID/Reference #: [id number]

## 2024-07-04 NOTE — Telephone Encounter (Signed)
 PA request has been Received. New Encounter has been or will be created for follow up. For additional info see Pharmacy Prior Auth telephone encounter from 07/28.

## 2024-07-04 NOTE — Telephone Encounter (Signed)
*  AA  Pharmacy Patient Advocate Encounter   Received notification from Pt Calls Messages that prior authorization for Azelastine -Fluticasone  137-50MCG/ACT suspension  is required/requested.   Insurance verification completed.   The patient is insured through [facility] .   Per test claim: PA required; PA submitted to above mentioned insurance via CoverMyMeds Key/confirmation #/EOC [id number] Status is pending

## 2024-07-08 ENCOUNTER — Ambulatory Visit: Admitting: Gastroenterology

## 2024-07-08 ENCOUNTER — Telehealth (HOSPITAL_COMMUNITY): Payer: Self-pay

## 2024-07-08 ENCOUNTER — Other Ambulatory Visit (HOSPITAL_COMMUNITY): Payer: Self-pay

## 2024-07-08 ENCOUNTER — Other Ambulatory Visit: Payer: Self-pay

## 2024-07-08 ENCOUNTER — Encounter: Payer: Self-pay | Admitting: Neurology

## 2024-07-08 ENCOUNTER — Encounter: Payer: Self-pay | Admitting: Gastroenterology

## 2024-07-08 ENCOUNTER — Other Ambulatory Visit: Payer: Self-pay | Admitting: Adult Health

## 2024-07-08 ENCOUNTER — Other Ambulatory Visit (HOSPITAL_BASED_OUTPATIENT_CLINIC_OR_DEPARTMENT_OTHER): Payer: Self-pay

## 2024-07-08 ENCOUNTER — Encounter: Payer: Self-pay | Admitting: Adult Health

## 2024-07-08 VITALS — BP 110/64 | HR 74 | Ht 66.0 in | Wt 183.1 lb

## 2024-07-08 DIAGNOSIS — R1310 Dysphagia, unspecified: Secondary | ICD-10-CM | POA: Diagnosis not present

## 2024-07-08 DIAGNOSIS — K581 Irritable bowel syndrome with constipation: Secondary | ICD-10-CM | POA: Diagnosis not present

## 2024-07-08 MED ORDER — LINACLOTIDE 290 MCG PO CAPS
290.0000 ug | ORAL_CAPSULE | Freq: Every day | ORAL | 4 refills | Status: AC
  Filled 2024-07-08: qty 90, 90d supply, fill #0
  Filled 2024-09-23: qty 90, 90d supply, fill #1
  Filled 2024-12-05: qty 90, 90d supply, fill #2
  Filled ????-??-??: fill #2

## 2024-07-08 NOTE — Progress Notes (Signed)
 Chief Complaint:follow-up, medication refills Primary GI Doctor:Dr. San  HPI:  53 year old with history of  RYGB in 06/2015, migraines and medical history as below initially seen in GI clinic on 02/21/2022 for screening colonoscopy evaluation of GI symptoms.     History of constipation, with episodic loose stools.  Does have associated abdominal bloating and LUQ discomfort.  Has previously trialed fiber, MiraLAX, senna, lactulose .   Reports a history of gastric ulcer in the past when living in [place] .  Separately with intermittent solid food dysphagia.   Family history notable for sister with Crohn's disease.   - 02/21/2022: Initial appointment in GI clinic - 03/21/2022: EGD: Normal esophagus dilated with 17 mm Savary without mucosal rent.  Normal Z-line.  Healthy-appearing Roux-en-Y gastric bypass anatomy (path benign), normal small bowel (path benign) - 03/21/2022: Colonoscopy: 2 polyps 3-5 mm (path: TA x1, HP x1), Otherwise normal appearing colon (path benign without MC, IBD).  Grade 1 internal hemorrhoids.  Normal TI.  Repeat in 7 years - 04/04/2022: Normal CBC, CMP, TSH, HCV, HIV, vitamin D .  Ferritin 11, iron 50, TIBC 393, sat 12.7%.  B12 206, folate 6.2.  Vitamin A  34.  PCM recommended increasing dietary vitamin A  --esophageal manometry:  - Elevated resting EG junction pressure with complete relaxation and normal IRP  -100% of the swallows were normal and peristaltic  - Elevated DCI at 6873 (normal <5000)  - No manometric evidence of hiatal hernia  - Normal relaxation in contractile phase with multiple rapid swallows  - Complete bolus clearance on 9/10 swallows  - The quality of the study was adversely affected by double/multiple swallows   Impressions:  Hypercontractile esophagus with otherwise normal relaxation and preserved peristalsis.     Patient last seen in the GI office by Dr. San on 05/09/2022 for follow-up on constipation and dysphagia.  Interval History     Patient presents for follow-up and medication refills.  Patient has history of chronic constipation that has been well managed on Linzess  290 mcg po daily. She reports she has bowel movement every 1-2 days. She has increased flatulence if she is more constipated.     Patient has had some reoccurring issues with pill dysphagia over the last year that has increased in severity and frequency. She has required multiple dilatations in the past that have provided some relief for period of time.   Wt Readings from Last 3 Encounters:  07/08/24 183 lb 2 oz (83.1 kg)  06/17/24 181 lb (82.1 kg)  05/27/24 181 lb (82.1 kg)     Past Medical History:  Diagnosis Date   Allergy    Anxiety    Arthritis    Asthma    Chicken pox    Complication of anesthesia    slow to wake up from anesthesia   Depression    DJD (degenerative joint disease)    GERD (gastroesophageal reflux disease)    resolved after gastric surgery   H/O gastric bypass 2016   History of iron deficiency anemia    HSV infection    Migraines    Obese    Pneumonia    Childhood history   PONV (postoperative nausea and vomiting)    Status post dilation of esophageal narrowing    Varicose vein of leg     Past Surgical History:  Procedure Laterality Date   ANKLE FRACTURE SURGERY Left 03/08/2009   BREAST SURGERY Bilateral    Cyst Removal   ESOPHAGEAL MANOMETRY N/A 09/24/2022   Procedure: ESOPHAGEAL MANOMETRY (EM);  Surgeon: San Sandor GAILS, DO;  Location: [facility];  Service: Gastroenterology;  Laterality: N/A;   GASTRIC BYPASS  06/18/2015   LAPAROSCOPIC VAGINAL HYSTERECTOMY WITH SALPINGECTOMY Bilateral 05/15/2020   Procedure: LAPAROSCOPIC ASSISTED VAGINAL HYSTERECTOMY WITH SALPINGECTOMY;  Surgeon: Marget Lenis, MD;  Location: [facility];  Service: Gynecology;  Laterality: Bilateral;  need bed   LASER ABLATION     Varicose veins   TONSILLECTOMY AND ADENOIDECTOMY  2001   TUBAL LIGATION  12/19/1999   UPPER  GASTROINTESTINAL ENDOSCOPY     UPPER GI ENDOSCOPY     Uterine ablation  12/2018    Current Outpatient Medications  Medication Sig Dispense Refill   acetaminophen  (TYLENOL ) 500 MG tablet Take by mouth.     acyclovir  ointment (ZOVIRAX ) 5 % Apply onto the affected area every 3 hours. 15 g 1   albuterol  (VENTOLIN  HFA) 108 (90 Base) MCG/ACT inhaler Inhale 1 - 2 puffs into the lungs every 6 hours as needed for wheezing or shortness of breath. 20.1 g 0   amitriptyline  (ELAVIL ) 10 MG tablet Take 1 tablet (10 mg total) by mouth at bedtime. 90 tablet 1   Azelastine -Fluticasone  (DYMISTA ) 137-50 MCG/ACT SUSP Place 2 sprays into both nostrils 2 (two) times daily as needed. 23 g 1   botulinum toxin Type A  (BOTOX ) 200 units injection Provider to inject 155 units into the muscles of the head and neck every 12 weeks. Discard remainder. 1 each 3   CALCIUM PO Take 1 tablet by mouth daily in the afternoon.     cetirizine  (ZYRTEC ) 10 MG tablet Take 2 tablets (20 mg total) by mouth 2 (two) times daily. 360 tablet 1   diazepam  (VALIUM ) 10 MG tablet Take 1 tablet (10 mg total) by mouth at bedtime as needed for sleep. 30 tablet 2   EPINEPHrine  0.3 mg/0.3 mL IJ SOAJ injection Inject 0.3 mg into the muscle as needed for anaphylaxis. 2 each 1   fluconazole  (DIFLUCAN ) 150 MG tablet Take 1 tablet (150 mg total) by mouth daily. 14 tablet 5   Fluticasone -Umeclidin-Vilant (TRELEGY ELLIPTA ) 200-62.5-25 MCG/ACT AEPB Inhale 1 puff into the lungs daily. 180 each 1   Fremanezumab -vfrm (AJOVY ) 225 MG/1.5ML SOAJ Inject 225 mg into the skin every 30 (thirty) days. 1.5 mL 11   Hypertonic Nasal Wash (SINUS RINSE REFILL) PACK Use one packet dissolved in water as needed. (Patient taking differently: Place 1 each into the nose in the morning and at bedtime.) 100 each 5   ibuprofen  (ADVIL ) 800 MG tablet Take 1 tablet (800 mg total) by mouth 3 (three) times daily as needed (pain.). 270 tablet 3   ipratropium (ATROVENT ) 0.06 % nasal spray  Place 2 sprays into both nostrils 4 (four) times daily. 45 mL 5   meloxicam  (MOBIC ) 15 MG tablet Take 1 tablet (15 mg total) by mouth at bedtime as needed for pain. 90 tablet 3   methylPREDNISolone  (MEDROL  DOSEPAK) 4 MG TBPK tablet Take pills daily all together with food. Take the first dose (6 pills) as soon as possible. Take the rest each morning. For 6 days total 6-5-4-3-2-1. 21 tablet 1   nystatin  (MYCOSTATIN ) 100000 UNIT/ML suspension Take 5 mLs (500,000 Units total) by mouth 4 (four) times daily. Gargle and spit. 473 mL 0   ondansetron  (ZOFRAN -ODT) 4 MG disintegrating tablet Dissolve 1-2 tablets (4-8 mg total) by mouth every 8 (eight) hours as needed. Caily Rakers take with Rizatriptan  90 tablet 3   pantoprazole  (PROTONIX ) 40 MG tablet Take 1 tablet (40 mg  total) by mouth at bedtime. 90 tablet 3   Pediatric Multiple Vit-C-FA (MULTIVITAMIN ANIMAL SHAPES, WITH CA/FA,) with C & FA chewable tablet Chew 2 tablets by mouth daily. Bariactric advatage     Rimegepant Sulfate  (NURTEC) 75 MG TBDP Take 1 tablet (75 mg total) by mouth daily as needed. 16 tablet 11   risedronate  (ACTONEL ) 150 MG tablet Take 1 tablet (150 mg total) by mouth every 30 (thirty) days. with water on empty stomach, nothing by mouth or lie down for next 30 minutes. 12 tablet 1   rizatriptan  (MAXALT -MLT) 10 MG disintegrating tablet Dissolve 1 tablet (10 mg total) by mouth as needed at onset of migraine. Tullio Chausse take another tablet 2 hours later if needed, max 2 tablets in 24 hours. 27 tablet 3   simethicone  (MYLICON) 80 MG chewable tablet Chew 80 mg by mouth every 6 (six) hours as needed for flatulence.     tezepelumab -ekko (TEZSPIRE ) 210 MG/1. syringe Inject 1.91 mLs (210 mg total) into the skin every 28 (twenty-eight) days. 1.91 mL 11   tizanidine  (ZANAFLEX ) 6 MG capsule Take 1 capsule (6 mg total) by mouth at bedtime. 90 capsule 3   topiramate  (TOPAMAX ) 25 MG tablet Take 1 tablet (25 mg total) by mouth at bedtime. 30 tablet 6    triamcinolone  ointment (KENALOG ) 0.1 % Apply topically 2 (two) times daily. 90 g 0   valACYclovir  (VALTREX ) 500 MG tablet Take 2 tablets (1,000 mg total) by mouth 2 (two) times daily. 360 tablet 3   Vibegron  (GEMTESA ) 75 MG TABS Take 1 tablet every day by oral route for 90 days. 90 tablet 4   linaclotide  (LINZESS ) 290 MCG CAPS capsule Take 1 capsule (290 mcg total) by mouth daily before breakfast. Patient needs follow up appointment for future refills. Please call [phone] to schedule an appointment. 90 capsule 4   Current Facility-Administered Medications  Medication Dose Route Frequency Provider Last Rate Last Admin   Fremanezumab -vfrm SOSY 225 mg  225 mg Subcutaneous Once        tezepelumab -ekko (TEZSPIRE ) 210 MG/1. syringe 210 mg  210 mg Subcutaneous Q28 days Iva Marty Saltness, MD   210 mg at 06/24/24 1142    Allergies as of 07/08/2024 - Review Complete 07/08/2024  Allergen Reaction Noted   Cyclobenzaprine Shortness Of Breath and Other (See Comments) 07/09/2012   Misc. sulfonamide containing compounds Anaphylaxis 01/27/2023   Shrimp flavor agent (non-screening) Anaphylaxis 09/26/2021   Sulfa antibiotics Anaphylaxis, Hives, and Swelling 05/25/2007   Carisoprodol Hives 12/15/2012   Cat dander Itching 04/27/2014   Cephalexin  Other (See Comments) 01/27/2023   Metronidazole  07/03/2021   Morphine Other (See Comments) 06/18/2015   Pollen extract Itching 04/27/2014   Topiramate   07/03/2021   Adhesive [tape] Rash 04/05/2015   Tramadol Itching, Nausea And Vomiting, and Rash 09/26/2021    Family History  Problem Relation Age of Onset   COPD Mother    Heart disease Mother    Alcohol abuse Mother    Depression Mother    Drug abuse Mother    High Cholesterol Mother    High blood pressure Mother    Migraines Mother    COPD Father    Aneurysm Father    Heart disease Father    Early death Father    Diabetes Father    Drug abuse Father    AAA (abdominal aortic aneurysm)  Father        cause of death at 53   Arthritis Sister    Kidney disease  Sister    Crohn's disease Sister    Ulcerative colitis Sister    Thyroid  disease Sister    Migraines Sister    Kidney disease Brother    Kidney disease Maternal Aunt    Diabetes Paternal Aunt    Asthma Neg Hx    Allergic rhinitis Neg Hx    Colon cancer Neg Hx    Rectal cancer Neg Hx    Stomach cancer Neg Hx     Review of Systems:    Constitutional: No weight loss, fever, chills, weakness or fatigue HEENT: Eyes: No change in vision               Ears, Nose, Throat:  No change in hearing or congestion Skin: No rash or itching Cardiovascular: No chest pain, chest pressure or palpitations   Respiratory: No SOB or cough Gastrointestinal: See HPI and otherwise negative Genitourinary: No dysuria or change in urinary frequency Neurological: No headache, dizziness or syncope Musculoskeletal: No new muscle or joint pain Hematologic: No bleeding or bruising Psychiatric: No history of depression or anxiety    Physical Exam:  Vital signs: BP 110/64 (BP Location: Left Arm, Patient Position: Sitting, Cuff Size: Large)   Pulse 74   Ht 5' 6 (1.676 m)   Wt 183 lb 2 oz (83.1 kg)   BMI 29.56 kg/m   Constitutional:   Pleasant female appears to be in NAD, Well developed, Well nourished, alert and cooperative Eyes:   PEERL, EOMI. No icterus. Conjunctiva pink. Neck:  Supple Throat: Oral cavity and pharynx without inflammation, swelling or lesion.  Respiratory: Respirations even and unlabored. Lungs clear to auscultation bilaterally.   No wheezes, crackles, or rhonchi.  Cardiovascular: Normal S1, S2. Regular rate and rhythm. No peripheral edema, cyanosis or pallor.  Gastrointestinal:  Soft, nondistended, nontender. No rebound or guarding. Normal bowel sounds. No appreciable masses or hepatomegaly. Rectal:  Not performed.  Msk:  Symmetrical without gross deformities. Without edema, no deformity or joint abnormality.   Neurologic:  Alert and  oriented x4;  grossly normal neurologically.  Skin:   Dry and intact without significant lesions or rashes.  RELEVANT LABS AND IMAGING: CBC    Latest Ref Rng & Units 04/28/2024    8:45 AM 10/28/2023    2:47 PM 04/24/2023   10:41 AM  CBC  WBC 4.0 - 10.5 K/uL 3.9  5.9  3.7   Hemoglobin 12.0 - 15.0 g/dL 87.0  87.2  86.8   Hematocrit 36.0 - 46.0 % 39.3  38.7  39.3   Platelets 150.0 - 400.0 K/uL 182.0  179  203.0      CMP     Latest Ref Rng & Units 04/28/2024    8:45 AM 05/29/2023   10:59 AM 04/24/2023   10:41 AM  CMP  Glucose 70 - 99 mg/dL 91   76   BUN 6 - 23 mg/dL 18   13   Creatinine 9.59 - 1.20 mg/dL 9.24  9.22  9.21   Sodium 135 - 145 mEq/L 138   137   Potassium 3.5 - 5.1 mEq/L 3.7   3.7   Chloride 96 - 112 mEq/L 104   101   CO2 19 - 32 mEq/L 28   30   Calcium 8.6 - 10.4 mg/dL 8.4 - 89.4 mg/dL 8.9    8.7   8.4    8.7   Total Protein 6.0 - 8.3 g/dL 7.3   6.9   Total Bilirubin 0.2 - 1.2 mg/dL 0.5  0.5   Alkaline Phos 39 - 117 U/L 56   46   AST 0 - 37 U/L 25   29   ALT 0 - 35 U/L 29   29      Lab Results  Component Value Date   TSH 1.80 04/28/2024  04/28/2024 labs show: Iron 68, ferritin 11.6, vitamin A  43, B12 691, folate 13.1, vitamin D  36.07  Assessment: Encounter Diagnoses  Name Primary?   Irritable bowel syndrome with constipation Yes   Pill dysphagia     53 year old female patient with history of chronic constipation that has well-controlled since starting Linzess , refill sent.    Patient has had reoccurring dysphagia that has improved in the past with a EGD with empiric dilatation.  Patient would like to proceed with endoscopy with dilatation in [facility] with Dr. San.  Reinforced dysphagia precautions. Patient has history of prior Roux-en-Y gastric bypass, recently had all of her vitamin levels checked.  Patient was placed on oral iron supplementation.  Other lab work normal.    Plan: -refill Linzess  290 mcg po daily, 90 day  supply -Schedule EGD with empiric dilation in [facility] with Dr. San. The risks and benefits of EGD with possible biopsies and esophageal dilation were discussed with the patient who agrees to proceed. - Repeat colonoscopy in 7 years (2030) for ongoing polyp surveillance    Thank you for the courtesy of this consult. Please call me with any questions or concerns.   Juwaun Inskeep, FNP-C [facility] 07/08/2024, 4:34 PM  Cc: Merna Huxley, NP

## 2024-07-08 NOTE — Patient Instructions (Addendum)
 Helpful Strategies for swallowing pills: Take only one pill at a time. Do not use fruit juices. These have been proven to alter effectiveness of medicines. Put your pill in a spoonful of applesauce or pudding - swallow in one bite. Take smaller, easier pills first. Starting off with pills that are easier to swallow can help reduce anxiety associated with pill taking difficulty.  Refilled Linzess  290 mcg po daily with refills  You have been scheduled for an endoscopy. Please follow written instructions given to you at your visit today.  If you use inhalers (even only as needed), please bring them with you on the day of your procedure.  If you take any of the following medications, they will need to be adjusted prior to your procedure:   DO NOT TAKE 7 DAYS PRIOR TO TEST- Trulicity (dulaglutide) Ozempic, Wegovy (semaglutide) Mounjaro (tirzepatide) Bydureon Bcise (exanatide extended release)  DO NOT TAKE 1 DAY PRIOR TO YOUR TEST Rybelsus (semaglutide) Adlyxin (lixisenatide) Victoza (liraglutide) Byetta (exanatide) ___________________________________________________________________________  Due to recent changes in healthcare laws, you may see the results of your imaging and laboratory studies on MyChart before your provider has had a chance to review them.  We understand that in some cases there may be results that are confusing or concerning to you. Not all laboratory results come back in the same time frame and the provider may be waiting for multiple results in order to interpret others.  Please give us  48 hours in order for your provider to thoroughly review all the results before contacting the office for clarification of your results.   _______________________________________________________  If your blood pressure at your visit was 140/90 or greater, please contact your primary care physician to follow up on this.  _______________________________________________________  If you  are age 92 or older, your body mass index should be between 23-30. Your Body mass index is 29.56 kg/m. If this is out of the aforementioned range listed, please consider follow up with your Primary Care Provider.  If you are age 72 or younger, your body mass index should be between 19-25. Your Body mass index is 29.56 kg/m. If this is out of the aformentioned range listed, please consider follow up with your Primary Care Provider.   ________________________________________________________  The [facility] providers would like to encourage you to use MYCHART to communicate with providers for non-urgent requests or questions.  Due to long hold times on the telephone, sending your provider a message by [facility] may be a faster and more efficient way to get a response.  Please allow 48 business hours for a response.  Please remember that this is for non-urgent requests.  _______________________________________________________  [facility] is using a team-based approach to care.  Your team is made up of your doctor and two to three APPS. Our APPS (Nurse Practitioners and Physician Assistants) work with your physician to ensure care continuity for you. They are fully qualified to address your health concerns and develop a treatment plan. They communicate directly with your gastroenterologist to care for you. Seeing the Advanced Practice Practitioners on your physician's team can help you by facilitating care more promptly, often allowing for earlier appointments, access to diagnostic testing, procedures, and other specialty referrals.   Thank you for trusting me with your gastrointestinal care. Deanna May, NP-C

## 2024-07-08 NOTE — Telephone Encounter (Signed)
 Okay for refill?

## 2024-07-11 ENCOUNTER — Other Ambulatory Visit (HOSPITAL_COMMUNITY): Payer: Self-pay

## 2024-07-11 ENCOUNTER — Telehealth: Payer: Self-pay

## 2024-07-11 ENCOUNTER — Telehealth: Payer: Self-pay | Admitting: *Deleted

## 2024-07-11 NOTE — Telephone Encounter (Signed)
 Pharmacy Patient Advocate Encounter   Received notification from Physician's Office that prior authorization for AJOVY  (fremanezumab -vfrm) injection 225MG /1.5ML auto-injectors is required/requested.   Insurance verification completed.   The patient is insured through [facility] .   Per test claim: PA required; PA submitted to above mentioned insurance via CoverMyMeds Key/confirmation #/EOC [id number] Status is pending

## 2024-07-11 NOTE — Telephone Encounter (Signed)
 Pt states PA needed for Ajovy . She is switching from Emgality  to Ajovy .

## 2024-07-11 NOTE — Progress Notes (Signed)
 Agree with the assessment and plan as outlined by [facility], FNP-C.  Carlitos Bottino, DO, [facility]

## 2024-07-11 NOTE — Telephone Encounter (Signed)
 Thanks

## 2024-07-12 ENCOUNTER — Ambulatory Visit: Admitting: Allergy & Immunology

## 2024-07-12 ENCOUNTER — Other Ambulatory Visit: Payer: Self-pay

## 2024-07-12 ENCOUNTER — Other Ambulatory Visit: Payer: Self-pay | Admitting: Adult Health

## 2024-07-12 DIAGNOSIS — J309 Allergic rhinitis, unspecified: Secondary | ICD-10-CM

## 2024-07-13 ENCOUNTER — Other Ambulatory Visit: Payer: Self-pay

## 2024-07-13 NOTE — Telephone Encounter (Signed)
 PA request has been Submitted. New Encounter has been or will be created for follow up. For additional info see Pharmacy Prior Auth telephone encounter from 07/11/24.

## 2024-07-14 ENCOUNTER — Other Ambulatory Visit (HOSPITAL_COMMUNITY): Payer: Self-pay

## 2024-07-14 NOTE — Progress Notes (Unsigned)
 [name] D.[name] [address] [place] [address] Phone: [phone]   Assessment and Plan:     There are no diagnoses linked to this encounter.  *** - Patient has received relief with OMT in the past.  Elects for repeat OMT today.  Tolerated well per note below. - Decision today to treat with OMT was based on Physical Exam   After verbal consent patient was treated with HVLA (high velocity low amplitude), ME (muscle energy), FPR (flex positional release), ST (soft tissue), PC/PD (Pelvic Compression/ Pelvic Decompression) techniques in cervical, rib, thoracic, lumbar, and pelvic areas. Patient tolerated the procedure well with improvement in symptoms.  Patient educated on potential side effects of soreness and recommended to rest, hydrate, and use Tylenol  as needed for pain control.   Pertinent previous records reviewed include ***    Follow Up: ***     Subjective:   I, Neilan Rizzo, am serving as a Neurosurgeon for [employer]   Chief Complaint: neck pain    HPI:    08/07/2023 Patient states her low back and neck are flared and here for adjustment    09/04/2023 Patient states that her upper trap is tight , right shoulder tightness and pain, low back flare of tightness she isnt able to stand up straight    10/09/2023 Patient states that she is tight. Has been trying to do some stretches.    10/16/2023 Patient states neck  and shoulder flare    11/13/2023 Patient states thoracic and cervical tightness/stiffness     11/26/2023 Patient states anterior shoulder/ clavicle pain. Decreased ROM. Ibu doesn't help . 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 Heat helps. Pain for about 2 weeks. Would like to know if she can get a refill of fosamax     01/29/2024 Patient states right shoulder is still in pain,  the pain is deep. Neck is alright for the moment but can feel  it trying to pull    02/26/2024 Patient states neck is good , just here for an adjustment low back and hips are in pain    03/18/2024 Patient states right hip is in pain. So in turn left hip is in pain. Neck is alright. Shoulder is still flared   04/01/2024 Patient states injections for neck and down the bad.    04/15/2024 Patient states back is great. No pain in the neck. Just an adjustment today. Right hip is tight and left ankle feels like its compacting and she can feel the metal.    05/13/2024 Patient states ready for an adjustment. Low back is really tight    05/27/2024 Patient states back and shoulders are tight    06/17/2024 Patient states low back and neck flare  07/15/2024 Patient states  Relevant Historical Information: Migraines, GERD, history of Roux-en-Y gastric bypass  Additional pertinent review of systems negative.  Current Outpatient Medications  Medication Sig Dispense Refill   acetaminophen  (TYLENOL ) 500 MG tablet Take by mouth.     acyclovir  ointment (ZOVIRAX ) 5 % Apply onto the affected area every 3 hours. 15 g 1   albuterol  (VENTOLIN  HFA) 108 (90 Base) MCG/ACT inhaler Inhale 1 - 2 puffs into the lungs every 6 hours as needed for wheezing or shortness of breath. 20.1 g 0   amitriptyline  (ELAVIL ) 10 MG tablet Take 1 tablet (10 mg total) by mouth at bedtime. 90 tablet 1  Azelastine -Fluticasone  (DYMISTA ) 137-50 MCG/ACT SUSP Place 2 sprays into both nostrils 2 (two) times daily as needed. 23 g 1   botulinum toxin Type A  (BOTOX ) 200 units injection Provider to inject 155 units into the muscles of the head and neck every 12 weeks. Discard remainder. 1 each 3   CALCIUM PO Take 1 tablet by mouth daily in the afternoon.     cetirizine  (ZYRTEC ) 10 MG tablet Take 2 tablets (20 mg total) by mouth 2 (two) times daily. 360 tablet 1   diazepam  (VALIUM ) 10 MG tablet Take 1 tablet (10 mg total) by mouth at bedtime as needed for sleep. 30 tablet 2   EPINEPHrine  0.3 mg/0.3 mL IJ SOAJ  injection Inject 0.3 mg into the muscle as needed for anaphylaxis. 2 each 1   fluconazole  (DIFLUCAN ) 150 MG tablet Take 1 tablet (150 mg total) by mouth daily. 14 tablet 5   Fluticasone -Umeclidin-Vilant (TRELEGY ELLIPTA ) 200-62.5-25 MCG/ACT AEPB Inhale 1 puff into the lungs daily. 180 each 1   Fremanezumab -vfrm (AJOVY ) 225 MG/1.5ML SOAJ Inject 225 mg into the skin every 30 (thirty) days. 1.5 mL 11   Hypertonic Nasal Wash (SINUS RINSE REFILL) PACK Use one packet dissolved in water as needed. (Patient taking differently: Place 1 each into the nose in the morning and at bedtime.) 100 each 5   ibuprofen  (ADVIL ) 800 MG tablet Take 1 tablet (800 mg total) by mouth 3 (three) times daily as needed (pain.). 270 tablet 3   ipratropium (ATROVENT ) 0.06 % nasal spray Place 2 sprays into both nostrils 4 (four) times daily. 45 mL 5   linaclotide  (LINZESS ) 290 MCG CAPS capsule Take 1 capsule (290 mcg total) by mouth daily before breakfast. Patient needs follow up appointment for future refills. Please call [phone] to schedule an appointment. 90 capsule 4   meloxicam  (MOBIC ) 15 MG tablet Take 1 tablet (15 mg total) by mouth at bedtime as needed for pain. 90 tablet 3   methylPREDNISolone  (MEDROL  DOSEPAK) 4 MG TBPK tablet Take pills daily all together with food. Take the first dose (6 pills) as soon as possible. Take the rest each morning. For 6 days total 6-5-4-3-2-1. 21 tablet 1   nystatin  (MYCOSTATIN ) 100000 UNIT/ML suspension Take 5 mLs (500,000 Units total) by mouth 4 (four) times daily. Gargle and spit. 473 mL 0   ondansetron  (ZOFRAN -ODT) 4 MG disintegrating tablet Dissolve 1-2 tablets (4-8 mg total) by mouth every 8 (eight) hours as needed. May take with Rizatriptan  90 tablet 3   pantoprazole  (PROTONIX ) 40 MG tablet Take 1 tablet (40 mg total) by mouth at bedtime. 90 tablet 3   Pediatric Multiple Vit-C-FA (MULTIVITAMIN ANIMAL SHAPES, WITH CA/FA,) with C & FA chewable tablet Chew 2 tablets by mouth daily.  Bariactric advatage     Rimegepant Sulfate  (NURTEC) 75 MG TBDP Take 1 tablet (75 mg total) by mouth daily as needed. 16 tablet 11   risedronate  (ACTONEL ) 150 MG tablet Take 1 tablet (150 mg total) by mouth every 30 (thirty) days. with water on empty stomach, nothing by mouth or lie down for next 30 minutes. 12 tablet 1   rizatriptan  (MAXALT -MLT) 10 MG disintegrating tablet Dissolve 1 tablet (10 mg total) by mouth as needed at onset of migraine. May take another tablet 2 hours later if needed, max 2 tablets in 24 hours. 27 tablet 3   simethicone  (MYLICON) 80 MG chewable tablet Chew 80 mg by mouth every 6 (six) hours as needed for flatulence.     tezepelumab -ekko (TEZSPIRE )  210 MG/1. syringe Inject 1.91 mLs (210 mg total) into the skin every 28 (twenty-eight) days. 1.91 mL 11   tizanidine  (ZANAFLEX ) 6 MG capsule Take 1 capsule (6 mg total) by mouth at bedtime. 90 capsule 3   topiramate  (TOPAMAX ) 25 MG tablet Take 1 tablet (25 mg total) by mouth at bedtime. 30 tablet 6   triamcinolone  ointment (KENALOG ) 0.1 % Apply topically 2 (two) times daily. 90 g 0   valACYclovir  (VALTREX ) 500 MG tablet Take 2 tablets (1,000 mg total) by mouth 2 (two) times daily. 360 tablet 3   Vibegron  (GEMTESA ) 75 MG TABS Take 1 tablet every day by oral route for 90 days. 90 tablet 4   Current Facility-Administered Medications  Medication Dose Route Frequency Provider Last Rate Last Admin   Fremanezumab -vfrm SOSY 225 mg  225 mg Subcutaneous Once        tezepelumab -ekko (TEZSPIRE ) 210 MG/1. syringe 210 mg  210 mg Subcutaneous Q28 days Iva Marty Saltness, MD   210 mg at 06/24/24 1142      Objective:     There were no vitals filed for this visit.    There is no height or weight on file to calculate BMI.    Physical Exam:     General: Well-appearing, cooperative, sitting comfortably in no acute distress.   OMT Physical Exam:  ASIS Compression Test: Positive Right Cervical: TTP paraspinal, *** Rib:  Bilateral elevated first rib with TTP Thoracic: TTP paraspinal,*** Lumbar: TTP paraspinal,*** Pelvis: Right anterior innominate  Electronically signed by:  Odis Mace D.[name] 3:14 PM 07/14/24

## 2024-07-14 NOTE — Telephone Encounter (Signed)
 Pharmacy Patient Advocate Encounter  Received notification from [facility] that Prior Authorization for AJOVY  (fremanezumab -vfrm) injection 225MG /1.5ML auto-injectors has been APPROVED from 07/13/2024 to 01/09/2025. Ran test claim, Copay is $24.98. This test claim was processed through [facility]- copay amounts may vary at other pharmacies due to pharmacy/plan contracts, or as the patient moves through the different stages of their insurance plan.   PA #/Case ID/Reference #: PA Case ID #: [id number]

## 2024-07-15 ENCOUNTER — Other Ambulatory Visit (HOSPITAL_COMMUNITY): Payer: Self-pay

## 2024-07-15 ENCOUNTER — Ambulatory Visit: Admitting: Sports Medicine

## 2024-07-15 VITALS — BP 120/72 | Ht 66.0 in | Wt 183.0 lb

## 2024-07-15 DIAGNOSIS — M9902 Segmental and somatic dysfunction of thoracic region: Secondary | ICD-10-CM | POA: Diagnosis not present

## 2024-07-15 DIAGNOSIS — M545 Low back pain, unspecified: Secondary | ICD-10-CM

## 2024-07-15 DIAGNOSIS — M542 Cervicalgia: Secondary | ICD-10-CM | POA: Diagnosis not present

## 2024-07-15 DIAGNOSIS — M9908 Segmental and somatic dysfunction of rib cage: Secondary | ICD-10-CM

## 2024-07-15 DIAGNOSIS — M810 Age-related osteoporosis without current pathological fracture: Secondary | ICD-10-CM | POA: Diagnosis not present

## 2024-07-15 DIAGNOSIS — M9905 Segmental and somatic dysfunction of pelvic region: Secondary | ICD-10-CM | POA: Diagnosis not present

## 2024-07-15 DIAGNOSIS — Z9884 Bariatric surgery status: Secondary | ICD-10-CM | POA: Diagnosis not present

## 2024-07-15 DIAGNOSIS — M9901 Segmental and somatic dysfunction of cervical region: Secondary | ICD-10-CM | POA: Diagnosis not present

## 2024-07-15 DIAGNOSIS — G8929 Other chronic pain: Secondary | ICD-10-CM

## 2024-07-15 DIAGNOSIS — M9904 Segmental and somatic dysfunction of sacral region: Secondary | ICD-10-CM

## 2024-07-15 DIAGNOSIS — M9903 Segmental and somatic dysfunction of lumbar region: Secondary | ICD-10-CM | POA: Diagnosis not present

## 2024-07-15 MED ORDER — RISEDRONATE SODIUM 150 MG PO TABS
150.0000 mg | ORAL_TABLET | ORAL | 1 refills | Status: AC
  Filled 2024-07-15: qty 12, 360d supply, fill #0
  Filled 2024-11-06: qty 3, 90d supply, fill #0

## 2024-07-16 ENCOUNTER — Other Ambulatory Visit (HOSPITAL_COMMUNITY): Payer: Self-pay

## 2024-07-18 ENCOUNTER — Other Ambulatory Visit: Payer: Self-pay

## 2024-07-18 ENCOUNTER — Other Ambulatory Visit (HOSPITAL_COMMUNITY): Payer: Self-pay

## 2024-07-21 ENCOUNTER — Ambulatory Visit (INDEPENDENT_AMBULATORY_CARE_PROVIDER_SITE_OTHER): Admitting: Neurology

## 2024-07-21 VITALS — BP 114/79 | HR 77

## 2024-07-21 DIAGNOSIS — G43901 Migraine, unspecified, not intractable, with status migrainosus: Secondary | ICD-10-CM

## 2024-07-21 DIAGNOSIS — G43E01 Chronic migraine with aura, not intractable, with status migrainosus: Secondary | ICD-10-CM

## 2024-07-21 MED ORDER — ONABOTULINUMTOXINA 200 UNITS IJ SOLR
155.0000 [IU] | Freq: Once | INTRAMUSCULAR | Status: AC
  Administered 2024-07-21: 175 [IU] via INTRAMUSCULAR

## 2024-07-21 NOTE — Progress Notes (Signed)
 Botox - 200 units x 1 vial Lot: [id number] Expiration: 09/2026 NDC: 9976-6078-97  Bacteriostatic 0.9% Sodium Chloride - 4 mL  Lot: [id number] Expiration: 09/2025 NDC: 9590-8033-97  Dx: G43.901 S/P  Witnessed by Particia PEAK

## 2024-07-21 NOTE — Progress Notes (Signed)
 Consent Form Botulism Toxin Injection For Chronic Migraine 07/23/2024: stable, doing well 02/16/2024: May be moving to [place]. >50 % relief migraine and headache  freq and severity. Patient feels that her migraines cause her jaw to ache in her jaw aching also can cause her migraines to worsen it is a trigger for migraines included 10 units in each masseter to see if that helps with migraine severity.   10/27/2023: Stable. Feels jaw pain during migraines that is caused by migraine and worsens migraines, injected 10u in masseters bilat and states this has helped her migraines  08/04/2023: >50 % relief migraine and headache  freq and severity Tught muscles will give tizanidine  Discussed meloxicam , don;t use with ibuprofen  in the same class, watch kidney function Also watch for medication overuse taking too much ibuprofen  or tylenol  or triptans  Meds ordered this encounter  Medications   botulinum toxin Type A  (BOTOX ) injection 155 Units    Botox - 200 units x 1 vial Lot: [id number] Expiration: 09/2026 NDC: 9976-6078-97  Bacteriostatic 0.9% Sodium Chloride - 4 mL  Lot: [id number] Expiration: 09/2025 NDC: 9590-8033-97  Dx: G43.901 S/P  Witnessed by H56.298     Reviewed orally with patient, additionally signature is on file:  Botulism toxin has been approved by the Federal drug administration for treatment of chronic migraine. Botulism toxin does not cure chronic migraine and it may not be effective in some patients.  The administration of botulism toxin is accomplished by injecting a small amount of toxin into the muscles of the neck and head. Dosage must be titrated for each individual. Any benefits resulting from botulism toxin tend to wear off after 3 months with a repeat injection required if benefit is to be maintained. Injections are usually done every 3-4 months with maximum effect peak achieved by about 2 or 3 weeks. Botulism toxin is expensive and you should be sure of what costs you  will incur resulting from the injection.  The side effects of botulism toxin use for chronic migraine may include:   -Transient, and usually mild, facial weakness with facial injections  -Transient, and usually mild, head or neck weakness with head/neck injections  -Reduction or loss of forehead facial animation due to forehead muscle weakness  -Eyelid drooping  -Dry eye  -Pain at the site of injection or bruising at the site of injection  -Double vision  -Potential unknown long term risks  Contraindications: You should not have Botox  if you are pregnant, nursing, allergic to albumin, have an infection, skin condition, or muscle weakness at the site of the injection, or have myasthenia gravis, Lambert-Eaton syndrome, or ALS.  It is also possible that as with any injection, there may be an allergic reaction or no effect from the medication. Reduced effectiveness after repeated injections is sometimes seen and rarely infection at the injection site may occur. All care will be taken to prevent these side effects. If therapy is given over a long time, atrophy and wasting in the muscle injected may occur. Occasionally the patient's become refractory to treatment because they develop antibodies to the toxin. In this event, therapy needs to be modified.  I have read the above information and consent to the administration of botulism toxin.    BOTOX  PROCEDURE NOTE FOR MIGRAINE HEADACHE    Contraindications and precautions discussed with patient(above). Aseptic procedure was observed and patient tolerated procedure. Procedure performed by Dr. Andree Epp  The condition has existed for more than 6 months, and pt does not have a diagnosis of  ALS, Myasthenia Gravis or Lambert-Eaton Syndrome.  Risks and benefits of injections discussed and pt agrees to proceed with the procedure.  Written consent obtained  These injections are medically necessary. Pt  receives good benefits from these injections.  These injections do not cause sedations or hallucinations which the oral therapies may cause.  Description of procedure:  The patient was placed in a sitting position. The standard protocol was used for Botox  as follows, with 5 units of Botox  injected at each site:   -Procerus muscle, midline injection  -Corrugator muscle, bilateral injection  -Frontalis muscle, bilateral injection, with 2 sites each side, medial injection was performed in the upper one third of the frontalis muscle, in the region vertical from the medial inferior edge of the superior orbital rim. The lateral injection was again in the upper one third of the forehead vertically above the lateral limbus of the cornea, 1.5 cm lateral to the medial injection site.  -Temporalis muscle injection, 4 sites, bilaterally. The first injection was 3 cm above the tragus of the ear, second injection site was 1.5 cm to 3 cm up from the first injection site in line with the tragus of the ear. The third injection site was 1.5-3 cm forward between the first 2 injection sites. The fourth injection site was 1.5 cm posterior to the second injection site.   -Occipitalis muscle injection, 3 sites, bilaterally. The first injection was done one half way between the occipital protuberance and the tip of the mastoid process behind the ear. The second injection site was done lateral and superior to the first, 1 fingerbreadth from the first injection. The third injection site was 1 fingerbreadth superiorly and medially from the first injection site.  -Cervical paraspinal muscle injection, 2 sites, bilateral knee first injection site was 1 cm from the midline of the cervical spine, 3 cm inferior to the lower border of the occipital protuberance. The second injection site was 1.5 cm superiorly and laterally to the first injection site.  -Trapezius muscle injection was performed at 3 sites, bilaterally. The first injection site was in the upper trapezius muscle  halfway between the inflection point of the neck, and the acromion. The second injection site was one half way between the acromion and the first injection site. The third injection was done between the first injection site and the inflection point of the neck.   Will return for repeat injection in 3 months.   200 units of Botox  was used, 25 Botox  not injected was wasted. The patient tolerated the procedure well, there were no complications of the above procedure.

## 2024-07-22 ENCOUNTER — Other Ambulatory Visit: Payer: Self-pay

## 2024-07-22 ENCOUNTER — Ambulatory Visit: Admitting: Allergy & Immunology

## 2024-07-22 ENCOUNTER — Other Ambulatory Visit (HOSPITAL_COMMUNITY): Payer: Self-pay

## 2024-07-22 ENCOUNTER — Encounter: Payer: Self-pay | Admitting: Allergy & Immunology

## 2024-07-22 ENCOUNTER — Ambulatory Visit

## 2024-07-22 VITALS — BP 118/68 | HR 110 | Temp 97.7°F | Wt 182.2 lb

## 2024-07-22 DIAGNOSIS — J302 Other seasonal allergic rhinitis: Secondary | ICD-10-CM

## 2024-07-22 DIAGNOSIS — T7800XD Anaphylactic reaction due to unspecified food, subsequent encounter: Secondary | ICD-10-CM

## 2024-07-22 DIAGNOSIS — J3089 Other allergic rhinitis: Secondary | ICD-10-CM | POA: Diagnosis not present

## 2024-07-22 DIAGNOSIS — J4550 Severe persistent asthma, uncomplicated: Secondary | ICD-10-CM

## 2024-07-22 DIAGNOSIS — B999 Unspecified infectious disease: Secondary | ICD-10-CM

## 2024-07-22 MED ORDER — FLUCONAZOLE 150 MG PO TABS
150.0000 mg | ORAL_TABLET | Freq: Every day | ORAL | 5 refills | Status: DC
  Filled 2024-07-22 (×2): qty 14, 14d supply, fill #0
  Filled 2024-09-23: qty 14, 14d supply, fill #1
  Filled 2024-11-06: qty 14, 14d supply, fill #2

## 2024-07-22 MED ORDER — TRELEGY ELLIPTA 200-62.5-25 MCG/ACT IN AEPB
1.0000 | INHALATION_SPRAY | Freq: Every day | RESPIRATORY_TRACT | 1 refills | Status: DC
  Filled 2024-07-22 (×2): qty 180, 90d supply, fill #0

## 2024-07-22 MED ORDER — NEFFY 2 MG/0.1ML NA SOLN: 2.0000 mg | Freq: Two times a day (BID) | NASAL | 1 refills | Status: DC | PRN

## 2024-07-22 NOTE — Progress Notes (Unsigned)
   FOLLOW UP  Date of Service/Encounter:  07/22/24   Assessment:   Severe persistent asthma without complication  Moderate persistent asthma with acute exacerbation  Plan/Recommendations:   There are no Patient Instructions on file for this visit.   Subjective:   Donna Park is a 53 y.o. female presenting today for follow up of  Chief Complaint  Patient presents with  . Follow-up    Baker Mathew Sleep has a history of the following: Patient Active Problem List   Diagnosis Date Noted  . Dysuria 08/14/2023  . Dysphagia   . LUQ pain 03/01/2022  . Change in bowel habits 03/01/2022  . Loss of weight 03/01/2022  . Chronic migraine without aura without status migrainosus, not intractable 06/14/2021  . S/P laparoscopic assisted vaginal hysterectomy (LAVH) 05/15/2020  . Migraines   . GERD (gastroesophageal reflux disease)   . Asthma   . Seasonal and perennial allergic rhinitis 02/10/2019  . Moderate persistent asthma, uncomplicated 02/10/2019  . Anaphylactic shock due to adverse food reaction 02/10/2019  . Atopic dermatitis 02/10/2019  . History of Roux-en-Y gastric bypass 2016    History obtained from: chart review and {Persons; PED relatives w/patient:19415::patient}.  Discussed the use of AI scribe software for clinical note transcription with the patient and/or guardian, who gave verbal consent to proceed.  Donna Park is a 53 y.o. female presenting for {Blank single:19197::a food challenge,a drug challenge,skin testing,a sick visit,an evaluation of ***,a follow up visit}.  Asthma/Respiratory Symptom History: ***  Allergic Rhinitis Symptom History: ***  Food Allergy Symptom History: ***  Skin Symptom History: ***  GERD Symptom History: ***  Infection Symptom History: ***  Otherwise, there have been no changes to her past medical history, surgical history, family history, or social history.    Review of systems otherwise negative other than that  mentioned in the HPI.    Objective:   There were no vitals taken for this visit. There is no height or weight on file to calculate BMI.    Physical Exam   Diagnostic studies:    Spirometry: results normal (FEV1: 1.81/73%, FVC: 2.31/73%, FEV1/FVC: 80%).    Spirometry consistent with normal pattern. {Blank single:19197::Albuterol /Atrovent  nebulizer,Xopenex/Atrovent  nebulizer,Albuterol  nebulizer,Albuterol  four puffs via MDI,Xopenex four puffs via MDI} treatment given in clinic with {Blank single:19197::significant improvement in FEV1 per ATS criteria,significant improvement in FVC per ATS criteria,significant improvement in FEV1 and FVC per ATS criteria,improvement in FEV1, but not significant per ATS criteria,improvement in FVC, but not significant per ATS criteria,improvement in FEV1 and FVC, but not significant per ATS criteria,no improvement}.  Allergy Studies: {Blank single:19197::none,deferred due to recent antihistamine use,deferred due to insurance stipulations that require a separate visit for testing,labs sent instead, }    {Blank single:19197::Allergy testing results were read and interpreted by myself, documented by clinical staff., }      Marty Shaggy, MD  [facility]

## 2024-07-22 NOTE — Patient Instructions (Addendum)
 She t1. Moderate persistent asthma, uncomplicated - Lung testing looked AWESOME. - Tezspire  given today.  - Daily controller medication(s): Trelegy 200/62.5/25 one puff once daily and Tezspire  every four weeks. - Prior to physical activity: albuterol  2 puffs 10-15 minutes before physical activity. - Rescue medications: albuterol  4 puffs every 4-6 hours as needed - Changes during respiratory infections or worsening symptoms: ADD ON Arnuity to 1 puff twice daily for ONE TO TWO WEEKS. - Asthma control goals:  * Full participation in all desired activities (may need albuterol  before activity) * Albuterol  use two time or less a week on average (not counting use with activity) * Cough interfering with sleep two time or less a month * Oral steroids no more than once a year * No hospitalizations  2. Seasonal and perennial allergic rhinitis - Continue with cetirizine  20mg  twice daily. - Continue ipratropium bromide  nasal spray 2 sprays each nostril twice a day as needed. Do not use on the same day that you use Dymista  - Continue with Dymista  one sprays per nostril up to twice daily. - Continue with nasal saline rinses.   3. Anaphylactic shock due to food - Avoid shrimp. - EpiPen  is up to date.   4. Antibiotic allergies - Consider doing a Keflex  challenge - Consider doing a Bactrim challenge.  5. Recurrent infections - Repeat Streptococcal tiers to make sure that your immune system reacted well to the vaccine.   6. No follow-ups on file. You can have the follow up appointment with Dr. Iva or a Nurse Practicioner (our Nurse Practitioners are excellent and always have Physician oversight!).    Please inform us  of any Emergency Department visits, hospitalizations, or changes in symptoms. Call us  before going to the ED for breathing or allergy symptoms since we might be able to fit you in for a sick visit. Feel free to contact us  anytime with any questions, problems, or  concerns.  It was a pleasure to see you again today!  Websites that have reliable patient information: 1. American Academy of Asthma, Allergy, and Immunology: www.aaaai.org 2. Food Allergy Research and Education (FARE): foodallergy.org 3. Mothers of Asthmatics: http://www.asthmacommunitynetwork.org 4. American College of Allergy, Asthma, and Immunology: www.acaai.org      "Like" us  on Facebook and Instagram for our latest updates!      A healthy democracy works best when [employer] participate! Make sure you are registered to vote! If you have moved or changed any of your contact information, you will need to get this updated before voting! Scan the QR codes below to learn more!

## 2024-07-25 ENCOUNTER — Other Ambulatory Visit: Payer: Self-pay

## 2024-07-25 ENCOUNTER — Other Ambulatory Visit (HOSPITAL_COMMUNITY): Payer: Self-pay

## 2024-07-26 ENCOUNTER — Other Ambulatory Visit: Payer: Self-pay

## 2024-07-26 ENCOUNTER — Encounter: Payer: Self-pay | Admitting: Allergy & Immunology

## 2024-07-26 ENCOUNTER — Other Ambulatory Visit (HOSPITAL_COMMUNITY): Payer: Self-pay

## 2024-07-26 ENCOUNTER — Other Ambulatory Visit (HOSPITAL_BASED_OUTPATIENT_CLINIC_OR_DEPARTMENT_OTHER): Payer: Self-pay

## 2024-07-26 DIAGNOSIS — B999 Unspecified infectious disease: Secondary | ICD-10-CM | POA: Diagnosis not present

## 2024-07-26 MED ORDER — AZELASTINE-FLUTICASONE 137-50 MCG/ACT NA SUSP
2.0000 | Freq: Two times a day (BID) | NASAL | 1 refills | Status: DC | PRN
Start: 2024-07-26 — End: 2024-11-01
  Filled 2024-07-26: qty 23, 30d supply, fill #0

## 2024-07-27 ENCOUNTER — Other Ambulatory Visit (HOSPITAL_COMMUNITY): Payer: Self-pay

## 2024-07-28 ENCOUNTER — Telehealth: Payer: Self-pay

## 2024-07-28 NOTE — Telephone Encounter (Signed)
 Received FMLA form from [employer]. Sent mychart message to patient regarding form fee

## 2024-07-30 LAB — STREP PNEUMONIAE 23 SEROTYPES IGG
Pneumo Ab Type 1*: 1 ug/mL — ABNORMAL LOW (ref >1.3)
Pneumo Ab Type 12 (12F)*: 0.6 ug/mL — ABNORMAL LOW (ref >1.3)
Pneumo Ab Type 14*: 25.6 ug/mL (ref >1.3)
Pneumo Ab Type 17 (17F)*: 2.4 ug/mL (ref >1.3)
Pneumo Ab Type 19 (19F)*: 12.4 ug/mL (ref >1.3)
Pneumo Ab Type 2*: 0.3 ug/mL — ABNORMAL LOW (ref >1.3)
Pneumo Ab Type 20*: 1.1 ug/mL — ABNORMAL LOW (ref >1.3)
Pneumo Ab Type 22 (22F)*: 1.1 ug/mL — ABNORMAL LOW (ref >1.3)
Pneumo Ab Type 23 (23F)*: 16.6 ug/mL (ref >1.3)
Pneumo Ab Type 26 (6B)*: 32.8 ug/mL (ref >1.3)
Pneumo Ab Type 3*: 0.3 ug/mL — ABNORMAL LOW (ref >1.3)
Pneumo Ab Type 34 (10A)*: 31.1 ug/mL (ref >1.3)
Pneumo Ab Type 4*: 1 ug/mL — ABNORMAL LOW (ref >1.3)
Pneumo Ab Type 43 (11A)*: 2.5 ug/mL (ref >1.3)
Pneumo Ab Type 5*: 4.6 ug/mL (ref >1.3)
Pneumo Ab Type 51 (7F)*: 13.4 ug/mL (ref >1.3)
Pneumo Ab Type 54 (15B)*: 5.7 ug/mL (ref >1.3)
Pneumo Ab Type 56 (18C)*: 8.1 ug/mL (ref >1.3)
Pneumo Ab Type 57 (19A)*: 4.4 ug/mL (ref >1.3)
Pneumo Ab Type 68 (9V)*: 2.9 ug/mL (ref >1.3)
Pneumo Ab Type 70 (33F)*: 10.5 ug/mL (ref >1.3)
Pneumo Ab Type 8*: 4.7 ug/mL (ref >1.3)
Pneumo Ab Type 9 (9N)*: 1.9 ug/mL (ref >1.3)

## 2024-08-01 ENCOUNTER — Encounter: Payer: Self-pay | Admitting: Sports Medicine

## 2024-08-01 ENCOUNTER — Telehealth: Payer: Self-pay | Admitting: Sports Medicine

## 2024-08-01 NOTE — Telephone Encounter (Signed)
 error

## 2024-08-05 ENCOUNTER — Ambulatory Visit: Payer: Self-pay | Admitting: Allergy & Immunology

## 2024-08-09 ENCOUNTER — Encounter: Payer: Self-pay | Admitting: Neurology

## 2024-08-09 ENCOUNTER — Other Ambulatory Visit: Payer: Self-pay

## 2024-08-09 NOTE — Progress Notes (Signed)
 Specialty Pharmacy Refill Coordination Note  Donna Park is a 53 y.o. female assessed today regarding refills of clinic administered specialty medication(s) Tezepelumab -ekko (Tezspire )   Clinic requested Courier to Provider Office   Delivery date: 08/11/24   Injection date: 08/19/24  Verified address: [facility]  [address]  [place] [address]   Medication will be filled on 08/10/24.

## 2024-08-10 ENCOUNTER — Other Ambulatory Visit: Payer: Self-pay

## 2024-08-10 NOTE — Progress Notes (Unsigned)
 [facility] [address] [place] [address] Phone: [phone]   Assessment and Plan:     1. Neck pain (Primary) 2. Chronic bilateral low back pain without sciatica 3. Somatic dysfunction of cervical region 4. Somatic dysfunction of thoracic region 5. Somatic dysfunction of lumbar region 6. Somatic dysfunction of pelvic region 7. Somatic dysfunction of rib region -Chronic with exacerbation, subsequent visit - Overall improvement in musculoskeletal complaints with meloxicam  as needed, however recurrent flare of neck pain and lower back pain - Use Tylenol  500 to 1000 mg tablets 2-3 times a day for day-to-day pain relief - Use meloxicam  15 mg daily as needed for pain.  Recommend limiting chronic NSAIDs to 1-2 doses per week to prevent long-term side effects. -Continue HEP - Patient has received relief with OMT in the past.  Elects for repeat OMT today.  Tolerated well per note below. - Decision today to treat with OMT was based on Physical Exam   After verbal consent patient was treated with HVLA (high velocity low amplitude), ME (muscle energy), FPR (flex positional release), ST (soft tissue), PC/PD (Pelvic Compression/ Pelvic Decompression) techniques in cervical, rib, thoracic, lumbar, and pelvic areas. Patient tolerated the procedure well with improvement in symptoms.  Patient educated on potential side effects of soreness and recommended to rest, hydrate, and use Tylenol  as needed for pain control.   8.  Osteoporosis - Chronic, subsequent visit - Patient with T-score -2.9 on DEXA scan from 08/12/22 indicating osteoporosis -   calcium, vitamin D , creatinine previously WNL - Patient has trialed, but failed Fosamax  and Actonel  due to side effects - Patient has had GI issues in the past, however she was seen, evaluated, and cleared by GI - Recommend establishing care with Dr. Joane to review alternative osteoporosis treatment  options  Pertinent previous records reviewed include none  Follow Up: 4 weeks with me for reevaluation.  Could consider repeat OMT.  May establish care with Dr. Joane to further discuss osteoporosis treatments   Subjective:   I, Donna Park, am serving as a Neurosurgeon for Doctor Morene Mace   Chief Complaint: neck pain    HPI:    08/07/2023 Patient states her low back and neck are flared and here for adjustment    09/04/2023 Patient states that her upper trap is tight , right shoulder tightness and pain, low back flare of tightness she isnt able to stand up straight    10/09/2023 Patient states that she is tight. Has been trying to do some stretches.    10/16/2023 Patient states neck  and shoulder flare    11/13/2023 Patient states thoracic and cervical tightness/stiffness     11/26/2023 Patient states anterior shoulder/ clavicle pain. Decreased ROM. Ibu doesn't help . 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 Heat helps. Pain for about 2 weeks. Would like to know if she can get a refill of fosamax     01/29/2024 Patient states right shoulder is still in pain,  the pain is deep. Neck is alright for the moment but can feel it trying to pull    02/26/2024 Patient states neck is good , just here for an adjustment low back and hips are in pain    03/18/2024 Patient states right hip is in pain. So in turn left hip is in pain. Neck is alright. Shoulder is still flared   04/01/2024 Patient states injections  for neck and down the bad.    04/15/2024 Patient states back is great. No pain in the neck. Just an adjustment today. Right hip is tight and left ankle feels like its compacting and she can feel the metal.    05/13/2024 Patient states ready for an adjustment. Low back is really tight    05/27/2024 Patient states back and shoulders are tight    06/17/2024 Patient states low back and neck flare    07/15/2024 Patient states ready for adjustment   08/11/2024 Patient states low back and neck are flared. Whatever you did last time helped    Relevant Historical Information: Migraines, GERD, history of Roux-en-Y gastric bypass  Additional pertinent review of systems negative.  Current Outpatient Medications  Medication Sig Dispense Refill   acetaminophen  (TYLENOL ) 500 MG tablet Take by mouth.     acyclovir  ointment (ZOVIRAX ) 5 % Apply onto the affected area every 3 hours. 15 g 1   albuterol  (VENTOLIN  HFA) 108 (90 Base) MCG/ACT inhaler Inhale 1 - 2 puffs into the lungs every 6 hours as needed for wheezing or shortness of breath. 20.1 g 0   amitriptyline  (ELAVIL ) 10 MG tablet Take 1 tablet (10 mg total) by mouth at bedtime. 90 tablet 1   Azelastine -Fluticasone  (DYMISTA ) 137-50 MCG/ACT SUSP Place 2 sprays into both nostrils 2 (two) times daily as needed. 23 g 1   botulinum toxin Type A  (BOTOX ) 200 units injection Provider to inject 155 units into the muscles of the head and neck every 12 weeks. Discard remainder. 1 each 3   CALCIUM PO Take 1 tablet by mouth daily in the afternoon.     cetirizine  (ZYRTEC ) 10 MG tablet Take 2 tablets (20 mg total) by mouth 2 (two) times daily. 360 tablet 1   diazepam  (VALIUM ) 10 MG tablet Take 1 tablet (10 mg total) by mouth at bedtime as needed for sleep. 30 tablet 2   EPINEPHrine  (NEFFY ) 2 MG/0.1ML SOLN Place 2 mg into the nose 3 times/day as needed-between meals & bedtime. 6 each 1   fluconazole  (DIFLUCAN ) 150 MG tablet Take 1 tablet (150 mg total) by mouth daily. 14 tablet 5   Fluticasone -Umeclidin-Vilant (TRELEGY ELLIPTA ) 200-62.5-25 MCG/ACT AEPB Inhale 1 puff into the lungs daily. 180 each 1   Fremanezumab -vfrm (AJOVY ) 225 MG/1.5ML SOAJ Inject 225 mg into the skin every 30 (thirty) days. 1.5 mL 11   Hypertonic Nasal Wash (SINUS RINSE REFILL) PACK Use one packet dissolved in water as needed. (Patient taking differently: Place 1 each into the nose in the  morning and at bedtime.) 100 each 5   ibuprofen  (ADVIL ) 800 MG tablet Take 1 tablet (800 mg total) by mouth 3 (three) times daily as needed (pain.). 270 tablet 3   ipratropium (ATROVENT ) 0.06 % nasal spray Place 2 sprays into both nostrils 4 (four) times daily. 45 mL 5   linaclotide  (LINZESS ) 290 MCG CAPS capsule Take 1 capsule (290 mcg total) by mouth daily before breakfast. Patient needs follow up appointment for future refills. Please call [phone] to schedule an appointment. 90 capsule 4   meloxicam  (MOBIC ) 15 MG tablet Take 1 tablet (15 mg total) by mouth at bedtime as needed for pain. 90 tablet 3   methylPREDNISolone  (MEDROL  DOSEPAK) 4 MG TBPK tablet Take pills daily all together with food. Take the first dose (6 pills) as soon as possible. Take the rest each morning. For 6 days total 6-5-4-3-2-1. 21 tablet 1   nystatin  (MYCOSTATIN ) 100000 UNIT/ML suspension Take 5  mLs (500,000 Units total) by mouth 4 (four) times daily. Gargle and spit. 473 mL 0   ondansetron  (ZOFRAN -ODT) 4 MG disintegrating tablet Dissolve 1-2 tablets (4-8 mg total) by mouth every 8 (eight) hours as needed. May take with Rizatriptan  90 tablet 3   pantoprazole  (PROTONIX ) 40 MG tablet Take 1 tablet (40 mg total) by mouth at bedtime. 90 tablet 3   Pediatric Multiple Vit-C-FA (MULTIVITAMIN ANIMAL SHAPES, WITH CA/FA,) with C & FA chewable tablet Chew 2 tablets by mouth daily. Bariactric advatage     Rimegepant Sulfate  (NURTEC) 75 MG TBDP Take 1 tablet (75 mg total) by mouth daily as needed. 16 tablet 11   risedronate  (ACTONEL ) 150 MG tablet Take 1 tablet (150 mg total) by mouth every 30 (thirty) days. with water on empty stomach, nothing by mouth or lie down for next 30 minutes. 12 tablet 1   rizatriptan  (MAXALT -MLT) 10 MG disintegrating tablet Dissolve 1 tablet (10 mg total) by mouth as needed at onset of migraine. May take another tablet 2 hours later if needed, max 2 tablets in 24 hours. 27 tablet 3   simethicone  (MYLICON)  80 MG chewable tablet Chew 80 mg by mouth every 6 (six) hours as needed for flatulence.     tezepelumab -ekko (TEZSPIRE ) 210 MG/1. syringe Inject 1.91 mLs (210 mg total) into the skin every 28 (twenty-eight) days. 1.91 mL 11   tizanidine  (ZANAFLEX ) 6 MG capsule Take 1 capsule (6 mg total) by mouth at bedtime. 90 capsule 3   topiramate  (TOPAMAX ) 25 MG tablet Take 1 tablet (25 mg total) by mouth at bedtime. 30 tablet 6   triamcinolone  ointment (KENALOG ) 0.1 % Apply topically 2 (two) times daily. 90 g 0   valACYclovir  (VALTREX ) 500 MG tablet Take 2 tablets (1,000 mg total) by mouth 2 (two) times daily. 360 tablet 3   Vibegron  (GEMTESA ) 75 MG TABS Take 1 tablet every day by oral route for 90 days. 90 tablet 4   Current Facility-Administered Medications  Medication Dose Route Frequency Provider Last Rate Last Admin   Fremanezumab -vfrm SOSY 225 mg  225 mg Subcutaneous Once        tezepelumab -ekko (TEZSPIRE ) 210 MG/1. syringe 210 mg  210 mg Subcutaneous Q28 days Iva Marty Saltness, MD   210 mg at 07/22/24 1117      Objective:     Vitals:   08/11/24 1307  Pulse: (!) 106  SpO2: 99%  Weight: 182 lb (82.6 kg)  Height: 5' 6 (1.676 m)      Body mass index is 29.38 kg/m.    Physical Exam:     General: Well-appearing, cooperative, sitting comfortably in no acute distress.   OMT Physical Exam:  ASIS Compression Test: Positive Right Cervical: TTP paraspinal, C3-5 RRSR Rib: Bilateral elevated first rib with TTP Thoracic: TTP paraspinal, T6 RRSR Lumbar: TTP paraspinal, L1-3 RRSL Pelvis: Right anterior innominate  Electronically signed by:  Odis Mace D[name] 1:53 PM 08/11/24

## 2024-08-11 ENCOUNTER — Other Ambulatory Visit (HOSPITAL_COMMUNITY): Payer: Self-pay

## 2024-08-11 ENCOUNTER — Ambulatory Visit (INDEPENDENT_AMBULATORY_CARE_PROVIDER_SITE_OTHER): Admitting: Sports Medicine

## 2024-08-11 VITALS — HR 106 | Ht 66.0 in | Wt 182.0 lb

## 2024-08-11 DIAGNOSIS — M9903 Segmental and somatic dysfunction of lumbar region: Secondary | ICD-10-CM | POA: Diagnosis not present

## 2024-08-11 DIAGNOSIS — M9902 Segmental and somatic dysfunction of thoracic region: Secondary | ICD-10-CM | POA: Diagnosis not present

## 2024-08-11 DIAGNOSIS — M542 Cervicalgia: Secondary | ICD-10-CM | POA: Diagnosis not present

## 2024-08-11 DIAGNOSIS — M9901 Segmental and somatic dysfunction of cervical region: Secondary | ICD-10-CM

## 2024-08-11 DIAGNOSIS — M810 Age-related osteoporosis without current pathological fracture: Secondary | ICD-10-CM

## 2024-08-11 DIAGNOSIS — M9908 Segmental and somatic dysfunction of rib cage: Secondary | ICD-10-CM | POA: Diagnosis not present

## 2024-08-11 DIAGNOSIS — G8929 Other chronic pain: Secondary | ICD-10-CM | POA: Diagnosis not present

## 2024-08-11 DIAGNOSIS — M9905 Segmental and somatic dysfunction of pelvic region: Secondary | ICD-10-CM | POA: Diagnosis not present

## 2024-08-11 DIAGNOSIS — M545 Low back pain, unspecified: Secondary | ICD-10-CM

## 2024-08-12 ENCOUNTER — Encounter: Admitting: Gastroenterology

## 2024-08-15 ENCOUNTER — Other Ambulatory Visit: Payer: Self-pay

## 2024-08-18 ENCOUNTER — Other Ambulatory Visit: Payer: Self-pay

## 2024-08-18 ENCOUNTER — Other Ambulatory Visit (HOSPITAL_COMMUNITY): Payer: Self-pay

## 2024-08-18 NOTE — Progress Notes (Deleted)
   LILLETTE Ileana Collet, PhD, LAT, ATC acting as a scribe for Artist Lloyd, MD.  Baker Saha Vanrossum is a 53 y.o. female who presents to [facility] at [facility] today for osteoporosis management. Pt was seen last wk by Dr. Leonce for OMT.   DEXA scan (date, T-score): 08/12/22: Spine= -2.9, L-FN= -1.5, L-forearm= -2.9 Prior treatment: *** History of Hip, Spine, or Wrist Fx: *** Heart disease or stroke: *** Cancer: *** Kidney Disease: *** Gastric/Peptic Ulcer: *** Gastric bypass surgery: *** Severe GERD: *** Hx of seizures: *** Age at Menopause: *** Calcium intake: *** Vitamin D  intake: *** Hormone replacement therapy: *** Smoking history: *** Alcohol: *** Exercise: *** Major dental work in past year: *** Parents with hip/spine fracture: *** Height loss: ***      Pertinent review of systems: ***  Relevant historical information: ***   Exam:  There were no vitals taken for this visit. General: Well Developed, well nourished, and in no acute distress.   MSK: ***    Lab and Radiology Results No results found. However, due to the size of the patient record, not all encounters were searched. Please check Results Review for a complete set of results. No results found.     Assessment and Plan: 53 y.o. female with ***   PDMP not reviewed this encounter. No orders of the defined types were placed in this encounter.  No orders of the defined types were placed in this encounter.    Discussed warning signs or symptoms. Please see discharge instructions. Patient expresses understanding.   ***

## 2024-08-18 NOTE — Progress Notes (Signed)
 Specialty Pharmacy Ongoing Clinical Assessment Note  Donna Park is a 53 y.o. female who is being followed by the specialty pharmacy service for RxSp Asthma/COPD   Patient's specialty medication(s) reviewed today: Tezepelumab -ekko (Tezspire )   Missed doses in the last 4 weeks: 0   Patient/Caregiver did not have any additional questions or concerns.   Therapeutic benefit summary: Patient is achieving benefit   Adverse events/side effects summary: No adverse events/side effects   Patient's therapy is appropriate to: Continue    Goals Addressed             This Visit's Progress    Minimize recurrence of flares   On track    Patient is on track. Patient will maintain adherence.  Patient reports that she is very well-controlled at this time, only has to use rescue inhaler when sick with a cold or other respiratory virus.         Follow up: 1 year  [name] Specialty Pharmacist

## 2024-08-19 ENCOUNTER — Ambulatory Visit: Admitting: Family Medicine

## 2024-08-19 ENCOUNTER — Ambulatory Visit (INDEPENDENT_AMBULATORY_CARE_PROVIDER_SITE_OTHER)

## 2024-08-19 DIAGNOSIS — J4550 Severe persistent asthma, uncomplicated: Secondary | ICD-10-CM

## 2024-08-25 NOTE — Progress Notes (Unsigned)
 [name] D.[name] [address] [place] [address] Phone: [phone]   Assessment and Plan:     1. Neck pain (Primary) 2. Chronic bilateral thoracic back pain -Chronic with exacerbation, subsequent visit - Recurrence in multiple areas of musculoskeletal pain, most prominent being in neck and upper back - Patient has received significant relief with trigger point injections in the past.  Elected for repeat trigger point injections today.  Tolerated well per note below - Use meloxicam  15 mg daily as needed for pain.  Recommend limiting chronic NSAIDs to 1-2 doses per week to prevent long-term side effects. - Use Tylenol  500 to 1000 mg tablets 2-3 times a day for day-to-day pain relief - Continue HEP   Trigger Point Injection: After informed consent was obtained, skin cleaned with alcohol  prep.  A total of 8 trigger points identified along bilateral cervical paraspinal, thoracic paraspinal, rhomboid.  Injections given over area of pain for total injection of 1 mL Kenalog  40 mg/ML and 7 ml lidocaine  1% w/o epi.  Patient had relief after the injection without side effects.  Pt given signs of infection to watch for.   Pertinent previous records reviewed include none   Follow Up: 2 to 4 weeks for reevaluation.  Could consider repeat OMT versus trigger point injections   Subjective:   I, Keyondre Hepburn, am serving as a Neurosurgeon for Doctor [employer] [facility]   Chief Complaint: neck pain    HPI:    08/07/2023 Patient states her low back and neck are flared and here for adjustment    09/04/2023 Patient states that her upper trap is tight , right shoulder tightness and pain, low back flare of tightness she isnt able to stand up straight    10/09/2023 Patient states that she is tight. Has been trying to do some stretches.    10/16/2023 Patient states neck  and shoulder flare    11/13/2023 Patient states thoracic and cervical  tightness/stiffness     11/26/2023 Patient states anterior shoulder/ clavicle pain. Decreased ROM. Ibu doesn't help . 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 Heat helps. Pain for about 2 weeks. Would like to know if she can get a refill of fosamax     01/29/2024 Patient states right shoulder is still in pain,  the pain is deep. Neck is alright for the moment but can feel it trying to pull    02/26/2024 Patient states neck is good , just here for an adjustment low back and hips are in pain    03/18/2024 Patient states right hip is in pain. So in turn left hip is in pain. Neck is alright. Shoulder is still flared   04/01/2024 Patient states injections for neck and down the bad.    04/15/2024 Patient states back is great. No pain in the neck. Just an adjustment today. Right hip is tight and left ankle feels like its compacting and she can feel the metal.    05/13/2024 Patient states ready for an adjustment. Low back is really tight    05/27/2024 Patient states back and shoulders are tight    06/17/2024 Patient states low back and neck flare   07/15/2024 Patient states ready for adjustment    08/11/2024 Patient states low back and neck are flared. Whatever you did last time helped   08/26/2024 Patient states ready for injections. Back and neck tightness  Relevant Historical Information: Migraines, GERD, history of Roux-en-Y gastric bypass  Additional pertinent review of systems negative.   Current Outpatient Medications:    acetaminophen  (TYLENOL ) 500 MG tablet, Take by mouth., Disp: , Rfl:    acyclovir  ointment (ZOVIRAX ) 5 %, Apply onto the affected area every 3 hours., Disp: 15 g, Rfl: 1   albuterol  (VENTOLIN  HFA) 108 (90 Base) MCG/ACT inhaler, Inhale 1 - 2 puffs into the lungs every 6 hours as needed for wheezing or shortness of breath., Disp: 20.1 g, Rfl: 0   amitriptyline  (ELAVIL ) 10 MG tablet, Take 1 tablet  (10 mg total) by mouth at bedtime., Disp: 90 tablet, Rfl: 1   Azelastine -Fluticasone  (DYMISTA ) 137-50 MCG/ACT SUSP, Place 2 sprays into both nostrils 2 (two) times daily as needed., Disp: 23 g, Rfl: 1   botulinum toxin Type A  (BOTOX ) 200 units injection, Provider to inject 155 units into the muscles of the head and neck every 12 weeks. Discard remainder., Disp: 1 each, Rfl: 3   CALCIUM PO, Take 1 tablet by mouth daily in the afternoon., Disp: , Rfl:    cetirizine  (ZYRTEC ) 10 MG tablet, Take 2 tablets (20 mg total) by mouth 2 (two) times daily., Disp: 360 tablet, Rfl: 1   diazepam  (VALIUM ) 10 MG tablet, Take 1 tablet (10 mg total) by mouth at bedtime as needed for sleep., Disp: 30 tablet, Rfl: 2   EPINEPHrine  (NEFFY ) 2 MG/0.1ML SOLN, Place 2 mg into the nose 3 times/day as needed-between meals & bedtime., Disp: 6 each, Rfl: 1   fluconazole  (DIFLUCAN ) 150 MG tablet, Take 1 tablet (150 mg total) by mouth daily., Disp: 14 tablet, Rfl: 5   Fluticasone -Umeclidin-Vilant (TRELEGY ELLIPTA ) 200-62.5-25 MCG/ACT AEPB, Inhale 1 puff into the lungs daily., Disp: 180 each, Rfl: 1   Fremanezumab -vfrm (AJOVY ) 225 MG/1.5ML SOAJ, Inject 225 mg into the skin every 30 (thirty) days., Disp: 1.5 mL, Rfl: 11   Hypertonic Nasal Wash (SINUS RINSE REFILL) PACK, Use one packet dissolved in water as needed. (Patient taking differently: Place 1 each into the nose in the morning and at bedtime.), Disp: 100 each, Rfl: 5   ibuprofen  (ADVIL ) 800 MG tablet, Take 1 tablet (800 mg total) by mouth 3 (three) times daily as needed (pain.)., Disp: 270 tablet, Rfl: 3   ipratropium (ATROVENT ) 0.06 % nasal spray, Place 2 sprays into both nostrils 4 (four) times daily., Disp: 45 mL, Rfl: 5   linaclotide  (LINZESS ) 290 MCG CAPS capsule, Take 1 capsule (290 mcg total) by mouth daily before breakfast. Patient needs follow up appointment for future refills. Please call [phone] to schedule an appointment., Disp: 90 capsule, Rfl: 4   meloxicam   (MOBIC ) 15 MG tablet, Take 1 tablet (15 mg total) by mouth at bedtime as needed for pain., Disp: 90 tablet, Rfl: 3   methylPREDNISolone  (MEDROL  DOSEPAK) 4 MG TBPK tablet, Take pills daily all together with food. Take the first dose (6 pills) as soon as possible. Take the rest each morning. For 6 days total 6-5-4-3-2-1., Disp: 21 tablet, Rfl: 1   nystatin  (MYCOSTATIN ) 100000 UNIT/ML suspension, Take 5 mLs (500,000 Units total) by mouth 4 (four) times daily. Gargle and spit., Disp: 473 mL, Rfl: 0   ondansetron  (ZOFRAN -ODT) 4 MG disintegrating tablet, Dissolve 1-2 tablets (4-8 mg total) by mouth every 8 (eight) hours as needed. May take with Rizatriptan , Disp: 90 tablet, Rfl: 3   pantoprazole  (PROTONIX ) 40 MG tablet, Take 1 tablet (40 mg total) by mouth at bedtime., Disp: 90 tablet, Rfl: 3  Pediatric Multiple Vit-C-FA (MULTIVITAMIN ANIMAL SHAPES, WITH CA/FA,) with C & FA chewable tablet, Chew 2 tablets by mouth daily. Bariactric advatage, Disp: , Rfl:    Rimegepant Sulfate  (NURTEC) 75 MG TBDP, Take 1 tablet (75 mg total) by mouth daily as needed., Disp: 16 tablet, Rfl: 11   risedronate  (ACTONEL ) 150 MG tablet, Take 1 tablet (150 mg total) by mouth every 30 (thirty) days. with water on empty stomach, nothing by mouth or lie down for next 30 minutes., Disp: 12 tablet, Rfl: 1   rizatriptan  (MAXALT -MLT) 10 MG disintegrating tablet, Dissolve 1 tablet (10 mg total) by mouth as needed at onset of migraine. May take another tablet 2 hours later if needed, max 2 tablets in 24 hours., Disp: 27 tablet, Rfl: 3   simethicone  (MYLICON) 80 MG chewable tablet, Chew 80 mg by mouth every 6 (six) hours as needed for flatulence., Disp: , Rfl:    tezepelumab -ekko (TEZSPIRE ) 210 MG/1. syringe, Inject 1.91 mLs (210 mg total) into the skin every 28 (twenty-eight) days., Disp: 1.91 mL, Rfl: 11   tizanidine  (ZANAFLEX ) 6 MG capsule, Take 1 capsule (6 mg total) by mouth at bedtime., Disp: 90 capsule, Rfl: 3   topiramate   (TOPAMAX ) 25 MG tablet, Take 1 tablet (25 mg total) by mouth at bedtime., Disp: 30 tablet, Rfl: 6   triamcinolone  ointment (KENALOG ) 0.1 %, Apply topically 2 (two) times daily., Disp: 90 g, Rfl: 0   valACYclovir  (VALTREX ) 500 MG tablet, Take 2 tablets (1,000 mg total) by mouth 2 (two) times daily., Disp: 360 tablet, Rfl: 3   Vibegron  (GEMTESA ) 75 MG TABS, Take 1 tablet every day by oral route for 90 days., Disp: 90 tablet, Rfl: 4  Current Facility-Administered Medications:    Fremanezumab -vfrm SOSY 225 mg, 225 mg, Subcutaneous, Once,    tezepelumab -ekko (TEZSPIRE ) 210 MG/1. syringe 210 mg, 210 mg, Subcutaneous, Q28 days, Iva Marty Saltness, MD, 210 mg at 08/19/24 1500   Objective:     Vitals:   08/26/24 1010  Pulse: 74  SpO2: 99%  Weight: 182 lb (82.6 kg)  Height: 5' 6 (1.676 m)      Body mass index is 29.38 kg/m.    Physical Exam:    Gen: Appears well, nad, nontoxic and pleasant Psych: Alert and oriented, appropriate mood and affect Neuro: sensation intact, strength is 5/5 in upper and lower extremities, muscle tone wnl Skin: no susupicious lesions or rashes  Back - Normal skin, Spine with normal alignment and no deformity.   No tenderness to vertebral process palpation.   Bilateral cervical and thoracic paraspinous muscles are tender and without spasm Gait normal    Electronically signed by:  Odis Mace D.[name] 10:28 AM 08/26/24

## 2024-08-26 ENCOUNTER — Ambulatory Visit: Admitting: Sports Medicine

## 2024-08-26 ENCOUNTER — Telehealth: Payer: Self-pay | Admitting: Family Medicine

## 2024-08-26 VITALS — HR 74 | Ht 66.0 in | Wt 182.0 lb

## 2024-08-26 DIAGNOSIS — M542 Cervicalgia: Secondary | ICD-10-CM

## 2024-08-26 DIAGNOSIS — G8929 Other chronic pain: Secondary | ICD-10-CM

## 2024-08-26 DIAGNOSIS — M546 Pain in thoracic spine: Secondary | ICD-10-CM

## 2024-08-26 DIAGNOSIS — M810 Age-related osteoporosis without current pathological fracture: Secondary | ICD-10-CM

## 2024-08-26 NOTE — Telephone Encounter (Signed)
 Patient is currently scheduled to see Dr Joane as a new patient to discuss osteoporosis (she is a current patient of Dr Leonce). Her last bone density was done on 08/12/2022 and was diagnosed with Osteoporosis (report is in Epic). She asked if she should go ahead and have another bone density before seeing Dr Joane so she has something more recent or if she should see him first?  (She already has an order in from her primary care if needed.)  Please advise.

## 2024-08-30 NOTE — Telephone Encounter (Signed)
 Forwarding to Dr. Denyse Amass to review and advise.

## 2024-08-31 ENCOUNTER — Other Ambulatory Visit: Payer: Self-pay

## 2024-08-31 NOTE — Telephone Encounter (Signed)
 That is a good idea.  I ordered the DEXA bone density scan to the [facility] office.  Please contact my office to try to get that scheduled before you see me.

## 2024-08-31 NOTE — Addendum Note (Signed)
 Addended by: JOANE ARTIST RAMAN on: 08/31/2024 07:28 AM   Modules accepted: Orders

## 2024-09-01 ENCOUNTER — Other Ambulatory Visit: Payer: Self-pay

## 2024-09-02 ENCOUNTER — Other Ambulatory Visit: Payer: Self-pay

## 2024-09-02 ENCOUNTER — Ambulatory Visit: Admitting: Family Medicine

## 2024-09-02 NOTE — Telephone Encounter (Signed)
 Pt returned VM and was advised that we ordered a new DEXA scan to [facility] and we would like to see those results prior to her visit w/ Dr. Joane. Pt verbalized understanding.

## 2024-09-02 NOTE — Telephone Encounter (Signed)
 Called pt and left VM. Asked that we possibly hold off on visit for osteoporosis until after her repeat DEXA. Please provide her the phone number to [facility] to schedule the DEXA.

## 2024-09-02 NOTE — Progress Notes (Deleted)
   LILLETTE Ileana Collet, PhD, LAT, ATC acting as a scribe for Artist Lloyd, MD.  Donna Park is a 53 y.o. female who presents to [facility] at [facility] today for osteoporosis management. Pt was previously seen by Dr. Leonce for neck and back pain.  DEXA scan (date, T-score): *** Prior treatment: *** History of Hip, Spine, or Wrist Fx: *** Heart disease or stroke: *** Cancer: *** Kidney Disease: *** Gastric/Peptic Ulcer: *** Gastric bypass surgery: *** Severe GERD: *** Hx of seizures: *** Age at Menopause: *** Calcium intake: *** Vitamin D  intake: *** Hormone replacement therapy: *** Smoking history: *** Alcohol: *** Exercise: *** Major dental work in past year: *** Parents with hip/spine fracture: *** Height loss: ***   Pertinent review of systems: ***  Relevant historical information: ***   Exam:  There were no vitals taken for this visit. General: Well Developed, well nourished, and in no acute distress.   MSK: ***    Lab and Radiology Results No results found. However, due to the size of the patient record, not all encounters were searched. Please check Results Review for a complete set of results. No results found.     Assessment and Plan: 53 y.o. female with ***   PDMP not reviewed this encounter. No orders of the defined types were placed in this encounter.  No orders of the defined types were placed in this encounter.    Discussed warning signs or symptoms. Please see discharge instructions. Patient expresses understanding.   ***

## 2024-09-05 ENCOUNTER — Other Ambulatory Visit: Payer: Self-pay

## 2024-09-08 NOTE — Progress Notes (Unsigned)
 [name] D.[name] [address] [place] [address] Phone: [phone]   Assessment and Plan:     1. Neck pain (Primary) 2. Chronic bilateral thoracic back pain 3. Chronic bilateral low back pain without sciatica 4. Somatic dysfunction of cervical region 5. Somatic dysfunction of thoracic region 6. Somatic dysfunction of lumbar region 7. Somatic dysfunction of rib region 8. Somatic dysfunction of pelvic region -Chronic with exacerbation, subsequent visit - Overall improvement in multiple areas of musculoskeletal pain after trigger point injections with lidocaine /Kenalog  on 08/26/2024, however patient continued experiencing bilateral hip pain and neck pain - Use meloxicam  15 mg daily as needed for breakthrough pain.  Recommend limiting chronic NSAIDs to 1-2 doses per week to prevent long-term side effects. Use Tylenol  500 to 1000 mg tablets 2-3 times a day as needed for day-to-day pain relief.    - Patient has received relief with OMT in the past.  Elects for repeat OMT today.  Tolerated well per note below. - Decision today to treat with OMT was based on Physical Exam   After verbal consent patient was treated with HVLA (high velocity low amplitude), ME (muscle energy), FPR (flex positional release), ST (soft tissue), PC/PD (Pelvic Compression/ Pelvic Decompression) techniques in cervical, rib, thoracic, lumbar, and pelvic areas. Patient tolerated the procedure well with improvement in symptoms.  Patient educated on potential side effects of soreness and recommended to rest, hydrate, and use Tylenol  as needed for pain control.   Pertinent previous records reviewed include none  Follow Up: 3 weeks for reevaluation.  Could consider repeat trigger point injections or OMT based on presentation.  Continue to follow-up with Dr. Joane for evaluation of osteoporosis   Subjective:   I, [name], am serving as a Neurosurgeon for Doctor [employer]    Chief Complaint: neck pain    HPI:    08/07/2023 Patient states her low back and neck are flared and here for adjustment    09/04/2023 Patient states that her upper trap is tight , right shoulder tightness and pain, low back flare of tightness she isnt able to stand up straight    10/09/2023 Patient states that she is tight. Has been trying to do some stretches.    10/16/2023 Patient states neck  and shoulder flare    11/13/2023 Patient states thoracic and cervical tightness/stiffness     11/26/2023 Patient states anterior shoulder/ clavicle pain. Decreased ROM. Ibu doesn't help . 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 Heat helps. Pain for about 2 weeks. Would like to know if she can get a refill of fosamax     01/29/2024 Patient states right shoulder is still in pain,  the pain is deep. Neck is alright for the moment but can feel it trying to pull    02/26/2024 Patient states neck is good , just here for an adjustment low back and hips are in pain    03/18/2024 Patient states right hip is in pain. So in turn left hip is in pain. Neck is alright. Shoulder is still flared   04/01/2024 Patient states injections for neck and down the bad.    04/15/2024 Patient states back is great. No pain in the neck. Just an adjustment today. Right hip is tight and left ankle feels like its compacting and she can feel the metal.    05/13/2024 Patient states ready for an adjustment. Low back  is really tight    05/27/2024 Patient states back and shoulders are tight    06/17/2024 Patient states low back and neck flare   07/15/2024 Patient states ready for adjustment    08/11/2024 Patient states low back and neck are flared. Whatever you did last time helped    08/26/2024 Patient states ready for injections. Back and neck tightness   09/09/2024 Patient states neck tightness in one spot. Trap tightness just an adjustment     Relevant Historical Information: Migraines, GERD, history of Roux-en-Y gastric bypass  Additional pertinent review of systems negative.  Current Outpatient Medications  Medication Sig Dispense Refill   acetaminophen  (TYLENOL ) 500 MG tablet Take by mouth.     acyclovir  ointment (ZOVIRAX ) 5 % Apply onto the affected area every 3 hours. 15 g 1   albuterol  (VENTOLIN  HFA) 108 (90 Base) MCG/ACT inhaler Inhale 1 - 2 puffs into the lungs every 6 hours as needed for wheezing or shortness of breath. 20.1 g 0   amitriptyline  (ELAVIL ) 10 MG tablet Take 1 tablet (10 mg total) by mouth at bedtime. 90 tablet 1   Azelastine -Fluticasone  (DYMISTA ) 137-50 MCG/ACT SUSP Place 2 sprays into both nostrils 2 (two) times daily as needed. 23 g 1   botulinum toxin Type A  (BOTOX ) 200 units injection Provider to inject 155 units into the muscles of the head and neck every 12 weeks. Discard remainder. 1 each 3   CALCIUM PO Take 1 tablet by mouth daily in the afternoon.     cetirizine  (ZYRTEC ) 10 MG tablet Take 2 tablets (20 mg total) by mouth 2 (two) times daily. 360 tablet 1   diazepam  (VALIUM ) 10 MG tablet Take 1 tablet (10 mg total) by mouth at bedtime as needed for sleep. 30 tablet 2   EPINEPHrine  (NEFFY ) 2 MG/0.1ML SOLN Place 2 mg into the nose 3 times/day as needed-between meals & bedtime. 6 each 1   fluconazole  (DIFLUCAN ) 150 MG tablet Take 1 tablet (150 mg total) by mouth daily. 14 tablet 5   Fluticasone -Umeclidin-Vilant (TRELEGY ELLIPTA ) 200-62.5-25 MCG/ACT AEPB Inhale 1 puff into the lungs daily. 180 each 1   Fremanezumab -vfrm (AJOVY ) 225 MG/1.5ML SOAJ Inject 225 mg into the skin every 30 (thirty) days. 1.5 mL 11   Hypertonic Nasal Wash (SINUS RINSE REFILL) PACK Use one packet dissolved in water as needed. (Patient taking differently: Place 1 each into the nose in the morning and at bedtime.) 100 each 5   ibuprofen  (ADVIL ) 800 MG tablet Take 1 tablet (800 mg total) by mouth 3 (three) times daily as needed (pain.).  270 tablet 3   ipratropium (ATROVENT ) 0.06 % nasal spray Place 2 sprays into both nostrils 4 (four) times daily. 45 mL 5   linaclotide  (LINZESS ) 290 MCG CAPS capsule Take 1 capsule (290 mcg total) by mouth daily before breakfast. Patient needs follow up appointment for future refills. Please call [phone] to schedule an appointment. 90 capsule 4   meloxicam  (MOBIC ) 15 MG tablet Take 1 tablet (15 mg total) by mouth at bedtime as needed for pain. 90 tablet 3   methylPREDNISolone  (MEDROL  DOSEPAK) 4 MG TBPK tablet Take pills daily all together with food. Take the first dose (6 pills) as soon as possible. Take the rest each morning. For 6 days total 6-5-4-3-2-1. 21 tablet 1   nystatin  (MYCOSTATIN ) 100000 UNIT/ML suspension Take 5 mLs (500,000 Units total) by mouth 4 (four) times daily. Gargle and spit. 473 mL 0   ondansetron  (ZOFRAN -ODT) 4 MG disintegrating tablet  Dissolve 1-2 tablets (4-8 mg total) by mouth every 8 (eight) hours as needed. May take with Rizatriptan  90 tablet 3   pantoprazole  (PROTONIX ) 40 MG tablet Take 1 tablet (40 mg total) by mouth at bedtime. 90 tablet 3   Pediatric Multiple Vit-C-FA (MULTIVITAMIN ANIMAL SHAPES, WITH CA/FA,) with C & FA chewable tablet Chew 2 tablets by mouth daily. Bariactric advatage     Rimegepant Sulfate  (NURTEC) 75 MG TBDP Take 1 tablet (75 mg total) by mouth daily as needed. 16 tablet 11   risedronate  (ACTONEL ) 150 MG tablet Take 1 tablet (150 mg total) by mouth every 30 (thirty) days. with water on empty stomach, nothing by mouth or lie down for next 30 minutes. 12 tablet 1   rizatriptan  (MAXALT -MLT) 10 MG disintegrating tablet Dissolve 1 tablet (10 mg total) by mouth as needed at onset of migraine. May take another tablet 2 hours later if needed, max 2 tablets in 24 hours. 27 tablet 3   simethicone  (MYLICON) 80 MG chewable tablet Chew 80 mg by mouth every 6 (six) hours as needed for flatulence.     tezepelumab -ekko (TEZSPIRE ) 210 MG/1. syringe Inject  1.91 mLs (210 mg total) into the skin every 28 (twenty-eight) days. 1.91 mL 11   tizanidine  (ZANAFLEX ) 6 MG capsule Take 1 capsule (6 mg total) by mouth at bedtime. 90 capsule 3   topiramate  (TOPAMAX ) 25 MG tablet Take 1 tablet (25 mg total) by mouth at bedtime. 30 tablet 6   triamcinolone  ointment (KENALOG ) 0.1 % Apply topically 2 (two) times daily. 90 g 0   valACYclovir  (VALTREX ) 500 MG tablet Take 2 tablets (1,000 mg total) by mouth 2 (two) times daily. 360 tablet 3   Vibegron  (GEMTESA ) 75 MG TABS Take 1 tablet every day by oral route for 90 days. 90 tablet 4   Current Facility-Administered Medications  Medication Dose Route Frequency Provider Last Rate Last Admin   Fremanezumab -vfrm SOSY 225 mg  225 mg Subcutaneous Once        tezepelumab -ekko (TEZSPIRE ) 210 MG/1. syringe 210 mg  210 mg Subcutaneous Q28 days Iva Marty Saltness, MD   210 mg at 08/19/24 1500      Objective:     Vitals:   09/09/24 1015  BP: 108/78  Pulse: 91  SpO2: 99%  Weight: 179 lb (81.2 kg)  Height: 5' 6 (1.676 m)      Body mass index is 28.89 kg/m.    Physical Exam:     General: Well-appearing, cooperative, sitting comfortably in no acute distress.   OMT Physical Exam:  ASIS Compression Test: Positive Right Cervical: TTP paraspinal, C3 RLSR Rib: Bilateral elevated first rib with TTP Thoracic: TTP paraspinal, T4-6 RRSL Lumbar: TTP paraspinal, L2 RLSL Pelvis: Right anterior innominate  Electronically signed by:  Odis Mace D.[name] 10:57 AM 09/09/24

## 2024-09-09 ENCOUNTER — Ambulatory Visit: Admitting: Sports Medicine

## 2024-09-09 VITALS — BP 108/78 | HR 91 | Ht 66.0 in | Wt 179.0 lb

## 2024-09-09 DIAGNOSIS — M9908 Segmental and somatic dysfunction of rib cage: Secondary | ICD-10-CM | POA: Diagnosis not present

## 2024-09-09 DIAGNOSIS — M545 Low back pain, unspecified: Secondary | ICD-10-CM | POA: Diagnosis not present

## 2024-09-09 DIAGNOSIS — M9905 Segmental and somatic dysfunction of pelvic region: Secondary | ICD-10-CM

## 2024-09-09 DIAGNOSIS — M546 Pain in thoracic spine: Secondary | ICD-10-CM | POA: Diagnosis not present

## 2024-09-09 DIAGNOSIS — M9903 Segmental and somatic dysfunction of lumbar region: Secondary | ICD-10-CM

## 2024-09-09 DIAGNOSIS — G8929 Other chronic pain: Secondary | ICD-10-CM | POA: Diagnosis not present

## 2024-09-09 DIAGNOSIS — M9902 Segmental and somatic dysfunction of thoracic region: Secondary | ICD-10-CM

## 2024-09-09 DIAGNOSIS — M542 Cervicalgia: Secondary | ICD-10-CM

## 2024-09-09 DIAGNOSIS — M9901 Segmental and somatic dysfunction of cervical region: Secondary | ICD-10-CM

## 2024-09-16 ENCOUNTER — Ambulatory Visit
Admission: RE | Admit: 2024-09-16 | Discharge: 2024-09-16 | Disposition: A | Discharge status: Home / Self Care | Source: Ambulatory Visit | Attending: Adult Health | Admitting: Adult Health

## 2024-09-16 DIAGNOSIS — M810 Age-related osteoporosis without current pathological fracture: Secondary | ICD-10-CM

## 2024-09-20 ENCOUNTER — Ambulatory Visit: Payer: Self-pay | Admitting: Family Medicine

## 2024-09-20 ENCOUNTER — Other Ambulatory Visit (HOSPITAL_COMMUNITY): Payer: Self-pay

## 2024-09-23 ENCOUNTER — Other Ambulatory Visit: Payer: Self-pay

## 2024-09-23 ENCOUNTER — Ambulatory Visit: Admitting: Family Medicine

## 2024-09-23 ENCOUNTER — Encounter: Payer: Self-pay | Admitting: Family Medicine

## 2024-09-23 ENCOUNTER — Other Ambulatory Visit: Payer: Self-pay | Admitting: Allergy & Immunology

## 2024-09-23 ENCOUNTER — Other Ambulatory Visit (HOSPITAL_COMMUNITY): Payer: Self-pay

## 2024-09-23 ENCOUNTER — Other Ambulatory Visit: Payer: Self-pay | Admitting: Adult Health

## 2024-09-23 ENCOUNTER — Encounter: Payer: Self-pay | Admitting: Adult Health

## 2024-09-23 VITALS — BP 118/78 | HR 94 | Ht 66.0 in | Wt 181.0 lb

## 2024-09-23 DIAGNOSIS — M542 Cervicalgia: Secondary | ICD-10-CM | POA: Diagnosis not present

## 2024-09-23 DIAGNOSIS — B370 Candidal stomatitis: Secondary | ICD-10-CM

## 2024-09-23 DIAGNOSIS — M8589 Other specified disorders of bone density and structure, multiple sites: Secondary | ICD-10-CM

## 2024-09-23 DIAGNOSIS — M25551 Pain in right hip: Secondary | ICD-10-CM

## 2024-09-23 NOTE — Patient Instructions (Addendum)
 Thank you for coming in today.   I've referred you to Physical Therapy.  Let us  know if you don't hear from them in one week.   Work on home exercises.   We should recheck bone density in 2 years.   Darleene should check Vit D at the next visit. I can as well if needed.

## 2024-09-23 NOTE — Progress Notes (Unsigned)
   LILLETTE Ileana Collet, PhD, LAT, ATC acting as a scribe for Artist Lloyd, MD.  Donna Park is a 53 y.o. female who presents to [facility] at [facility] today for osteoporosis management and DEXA review. Pt was previously seen by Dr. Leonce for OMT and other MS concerns.  DEXA scan (date, T-score): 09/16/24: Spine= -2.0, L-FN= -1.6, R-FN= -1.7 Prior treatment: Fosamax , risedronate  History of Hip, Spine, or Wrist Fx: 2010 tib/fib fx Heart disease or stroke: no Cancer: no Kidney Disease: no Gastric/Peptic Ulcer: yes- gastric ulcers Gastric bypass surgery: yes- 2016 Severe GERD: yes- taking pantoprazole  Hx of seizures: no Age at Menopause: 2021 hysterectomy Calcium intake: no (takes bariatric multiple vit) Vitamin D  intake: no Hormone replacement therapy: no-- neg Estradial reaction Smoking history: never smoked Alcohol: 1-2/month Exercise: walking Major dental work in past year: no Parents with hip/spine fracture: no Height loss: 5' 6.75 to 5' 6   Pertinent review of systems: No fevers or chills  Relevant historical information: History of a Roux-en-Y gastric bypass surgery and ulcers.   Exam:  BP 118/78   Pulse 94   Ht 5' 6 (1.676 m)   Wt 181 lb (82.1 kg)   SpO2 99%   BMI 29.21 kg/m  General: Well Developed, well nourished, and in no acute distress.   MSK: Normal gait      Assessment and Plan: 53 y.o. female with osteopenia.  Patient has not tolerated oral bisphosphonates previously.  We discussed options.  Plan to work on maximizing conservative management.  We discussed weightbearing exercise and resistance training.  She is currently taking vitamin D  and I recommend adding calcium.  She should have her vitamin D  level checked at the next time she has labs.  She should recheck bone density in about 2 years.  Additionally she does note some lateral hip pain and neck pain.  Plan for home exercise program and PT.  PDMP not reviewed this  encounter. Orders Placed This Encounter  Procedures   Ambulatory referral to Physical Therapy    Referral Priority:   Routine    Referral Type:   Physical Medicine    Referral Reason:   Specialty Services Required    Requested Specialty:   Physical Therapy    Number of Visits Requested:   1   No orders of the defined types were placed in this encounter.    Discussed warning signs or symptoms. Please see discharge instructions. Patient expresses understanding.   The above documentation has been reviewed and is accurate and complete Artist Lloyd, M.D.

## 2024-09-24 ENCOUNTER — Other Ambulatory Visit (HOSPITAL_COMMUNITY): Payer: Self-pay

## 2024-09-25 ENCOUNTER — Other Ambulatory Visit (HOSPITAL_COMMUNITY): Payer: Self-pay

## 2024-09-26 ENCOUNTER — Other Ambulatory Visit (HOSPITAL_COMMUNITY): Payer: Self-pay

## 2024-09-26 ENCOUNTER — Other Ambulatory Visit: Payer: Self-pay

## 2024-09-26 DIAGNOSIS — M8580 Other specified disorders of bone density and structure, unspecified site: Secondary | ICD-10-CM | POA: Insufficient documentation

## 2024-09-26 MED ORDER — ALBUTEROL SULFATE HFA 108 (90 BASE) MCG/ACT IN AERS
1.0000 | INHALATION_SPRAY | Freq: Four times a day (QID) | RESPIRATORY_TRACT | 0 refills | Status: DC | PRN
  Filled 2024-09-26: qty 20.1, 75d supply, fill #0

## 2024-09-26 MED ORDER — GEMTESA 75 MG PO TABS
75.0000 mg | ORAL_TABLET | Freq: Every day | ORAL | 0 refills | Status: DC
  Filled 2024-09-26: qty 90, 90d supply, fill #0

## 2024-09-27 ENCOUNTER — Other Ambulatory Visit: Payer: Self-pay

## 2024-09-27 ENCOUNTER — Other Ambulatory Visit: Payer: Self-pay | Admitting: *Deleted

## 2024-09-27 ENCOUNTER — Encounter: Payer: Self-pay | Admitting: *Deleted

## 2024-09-27 ENCOUNTER — Other Ambulatory Visit (HOSPITAL_COMMUNITY): Payer: Self-pay

## 2024-09-27 MED ORDER — PANTOPRAZOLE SODIUM 40 MG PO TBEC
40.0000 mg | DELAYED_RELEASE_TABLET | Freq: Every day | ORAL | 3 refills | Status: DC
  Filled 2024-09-27: qty 90, 90d supply, fill #0

## 2024-09-27 MED ORDER — NYSTATIN 100000 UNIT/ML MT SUSP
5.0000 mL | Freq: Four times a day (QID) | OROMUCOSAL | 0 refills | Status: AC
  Filled 2024-09-27: qty 473, 24d supply, fill #0

## 2024-09-27 NOTE — Telephone Encounter (Addendum)
 Received Tizandine  6 mg and Meloxicam  15 mg Rx request from [facility]. Dr Ines had given pt prescriptions back in August 2024. Next appt here is in December. Rx request to be sent to work-in provider to review.   Tizandine  refill history:    Meloxicam  refill history:

## 2024-09-27 NOTE — Telephone Encounter (Signed)
  error

## 2024-09-27 NOTE — Telephone Encounter (Signed)
 Please advise patient to request refill on Mobic  and Zanaflex  from PCP. These are not medications typically used for migraine management, especially, since patient has several other meds from migraine.

## 2024-09-28 ENCOUNTER — Other Ambulatory Visit: Payer: Self-pay

## 2024-09-28 ENCOUNTER — Other Ambulatory Visit (HOSPITAL_COMMUNITY): Payer: Self-pay

## 2024-09-28 MED ORDER — VALACYCLOVIR HCL 500 MG PO TABS
1000.0000 mg | ORAL_TABLET | Freq: Two times a day (BID) | ORAL | 0 refills | Status: DC
  Filled 2024-09-28: qty 360, 90d supply, fill #0

## 2024-09-29 NOTE — Progress Notes (Unsigned)
 [facility] [address] [place] [address] Phone: [phone]   Assessment and Plan:     1. Neck pain (Primary) 2. Chronic bilateral thoracic back pain 3. Chronic bilateral low back pain without sciatica 4. Somatic dysfunction of cervical region 5. Somatic dysfunction of thoracic region 6. Somatic dysfunction of lumbar region 7. Somatic dysfunction of rib region 8. Somatic dysfunction of pelvic region -Chronic with exacerbation, subsequent visit - Recurrence of musculoskeletal pain primarily in neck and upper back - Use meloxicam  15 mg daily as needed for breakthrough pain.  Recommend limiting chronic NSAIDs to 1-2 doses per week to prevent long-term side effects. Use Tylenol  500 to 1000 mg tablets 2-3 times a day as needed for day-to-day pain relief.    -Continue HEP - Last trigger point injections with Kenalog  performed on 08/26/2024 - Patient has received relief with OMT in the past.  Elects for repeat OMT today.  Tolerated well per note below. - Decision today to treat with OMT was based on Physical Exam   After verbal consent patient was treated with HVLA (high velocity low amplitude), ME (muscle energy), FPR (flex positional release), ST (soft tissue), PC/PD (Pelvic Compression/ Pelvic Decompression) techniques in cervical, rib, thoracic, lumbar, and pelvic areas. Patient tolerated the procedure well with improvement in symptoms.  Patient educated on potential side effects of soreness and recommended to rest, hydrate, and use Tylenol  as needed for pain control.   Pertinent previous records reviewed include none  Follow Up: 4 weeks for reevaluation.  Could consider repeat OMT versus trigger point injections with lidocaine    I, Claretha Schimke am a scribe for  Dr. Leonce.    Chief Complaint: neck pain    HPI:    08/07/2023 Patient states her low back and neck are flared and here for adjustment    09/04/2023 Patient states that  her upper trap is tight , right shoulder tightness and pain, low back flare of tightness she isnt able to stand up straight    10/09/2023 Patient states that she is tight. Has been trying to do some stretches.    10/16/2023 Patient states neck  and shoulder flare    11/13/2023 Patient states thoracic and cervical tightness/stiffness     11/26/2023 Patient states anterior shoulder/ clavicle pain. Decreased ROM. Ibu doesn't help . 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 Heat helps. Pain for about 2 weeks. Would like to know if she can get a refill of fosamax     01/29/2024 Patient states right shoulder is still in pain,  the pain is deep. Neck is alright for the moment but can feel it trying to pull    02/26/2024 Patient states neck is good , just here for an adjustment low back and hips are in pain    03/18/2024 Patient states right hip is in pain. So in turn left hip is in pain. Neck is alright. Shoulder is still flared   04/01/2024 Patient states injections for neck and down the bad.    04/15/2024 Patient states back is great. No pain in the neck. Just an adjustment today. Right hip is tight and left ankle feels like its compacting and she can feel the metal.    05/13/2024 Patient states ready for an adjustment. Low back is really tight    05/27/2024 Patient states back and shoulders are tight    06/17/2024 Patient states  low back and neck flare   07/15/2024 Patient states ready for adjustment    08/11/2024 Patient states low back and neck are flared. Whatever you did last time helped    08/26/2024 Patient states ready for injections. Back and neck tightness    09/09/2024 Patient states neck tightness in one spot. Trap tightness just an adjustment   09/30/2024 Patient states neck and shoulders hurt today in the same area as usual.   Relevant Historical Information: Migraines, GERD, history of Roux-en-Y gastric  bypass  Additional pertinent review of systems negative.  Current Outpatient Medications  Medication Sig Dispense Refill   acetaminophen  (TYLENOL ) 500 MG tablet Take by mouth.     acyclovir  ointment (ZOVIRAX ) 5 % Apply onto the affected area every 3 hours. 15 g 1   albuterol  (VENTOLIN  HFA) 108 (90 Base) MCG/ACT inhaler Inhale 1 - 2 puffs into the lungs every 6 hours as needed for wheezing or shortness of breath. 20.1 g 0   amitriptyline  (ELAVIL ) 10 MG tablet Take 1 tablet (10 mg total) by mouth at bedtime. 90 tablet 1   Azelastine -Fluticasone  (DYMISTA ) 137-50 MCG/ACT SUSP Place 2 sprays into both nostrils 2 (two) times daily as needed. 23 g 1   botulinum toxin Type A  (BOTOX ) 200 units injection Provider to inject 155 units into the muscles of the head and neck every 12 weeks. Discard remainder. 1 each 3   CALCIUM PO Take 1 tablet by mouth daily in the afternoon.     cetirizine  (ZYRTEC ) 10 MG tablet Take 2 tablets (20 mg total) by mouth 2 (two) times daily. 360 tablet 1   diazepam  (VALIUM ) 10 MG tablet Take 1 tablet (10 mg total) by mouth at bedtime as needed for sleep. 30 tablet 2   EPINEPHrine  (NEFFY ) 2 MG/0.1ML SOLN Place 2 mg into the nose 3 times/day as needed-between meals & bedtime. 6 each 1   fluconazole  (DIFLUCAN ) 150 MG tablet Take 1 tablet (150 mg total) by mouth daily. 14 tablet 5   Fluticasone -Umeclidin-Vilant (TRELEGY ELLIPTA ) 200-62.5-25 MCG/ACT AEPB Inhale 1 puff into the lungs daily. 180 each 1   Fremanezumab -vfrm (AJOVY ) 225 MG/1.5ML SOAJ Inject 225 mg into the skin every 30 (thirty) days. 1.5 mL 11   Hypertonic Nasal Wash (SINUS RINSE REFILL) PACK Use one packet dissolved in water as needed. (Patient taking differently: Place 1 each into the nose in the morning and at bedtime.) 100 each 5   ibuprofen  (ADVIL ) 800 MG tablet Take 1 tablet (800 mg total) by mouth 3 (three) times daily as needed (pain.). 270 tablet 3   ipratropium (ATROVENT ) 0.06 % nasal spray Place 2 sprays into  both nostrils 4 (four) times daily. 45 mL 5   linaclotide  (LINZESS ) 290 MCG CAPS capsule Take 1 capsule (290 mcg total) by mouth daily before breakfast. Patient needs follow up appointment for future refills. Please call [phone] to schedule an appointment. 90 capsule 4   meloxicam  (MOBIC ) 15 MG tablet Take 1 tablet (15 mg total) by mouth at bedtime as needed for pain. 90 tablet 3   methylPREDNISolone  (MEDROL  DOSEPAK) 4 MG TBPK tablet Take pills daily all together with food. Take the first dose (6 pills) as soon as possible. Take the rest each morning. For 6 days total 6-5-4-3-2-1. 21 tablet 1   nystatin  (MYCOSTATIN ) 100000 UNIT/ML suspension Take 5 mLs (500,000 Units total) by mouth 4 (four) times daily. Gargle and spit. 473 mL 0   ondansetron  (ZOFRAN -ODT) 4 MG disintegrating tablet Dissolve 1-2 tablets (4-8  mg total) by mouth every 8 (eight) hours as needed. May take with Rizatriptan  90 tablet 3   pantoprazole  (PROTONIX ) 40 MG tablet Take 1 tablet (40 mg total) by mouth at bedtime. 90 tablet 3   Pediatric Multiple Vit-C-FA (MULTIVITAMIN ANIMAL SHAPES, WITH CA/FA,) with C & FA chewable tablet Chew 2 tablets by mouth daily. Bariactric advatage     Rimegepant Sulfate  (NURTEC) 75 MG TBDP Take 1 tablet (75 mg total) by mouth daily as needed. 16 tablet 11   risedronate  (ACTONEL ) 150 MG tablet Take 1 tablet (150 mg total) by mouth every 30 (thirty) days. with water on empty stomach, nothing by mouth or lie down for next 30 minutes. 12 tablet 1   rizatriptan  (MAXALT -MLT) 10 MG disintegrating tablet Dissolve 1 tablet (10 mg total) by mouth as needed at onset of migraine. May take another tablet 2 hours later if needed, max 2 tablets in 24 hours. 27 tablet 3   simethicone  (MYLICON) 80 MG chewable tablet Chew 80 mg by mouth every 6 (six) hours as needed for flatulence.     tezepelumab -ekko (TEZSPIRE ) 210 MG/1. syringe Inject 1.91 mLs (210 mg total) into the skin every 28 (twenty-eight) days. 1.91 mL 11    tizanidine  (ZANAFLEX ) 6 MG capsule Take 1 capsule (6 mg total) by mouth at bedtime. 90 capsule 3   topiramate  (TOPAMAX ) 25 MG tablet Take 1 tablet (25 mg total) by mouth at bedtime. 30 tablet 6   triamcinolone  ointment (KENALOG ) 0.1 % Apply topically 2 (two) times daily. 90 g 0   valACYclovir  (VALTREX ) 500 MG tablet Take 2 tablets (1,000 mg total) by mouth 2 (two) times daily. 360 tablet 0   Vibegron  (GEMTESA ) 75 MG TABS Take 1 tablet (75 mg total) by mouth daily. 90 tablet 0   Current Facility-Administered Medications  Medication Dose Route Frequency Provider Last Rate Last Admin   Fremanezumab -vfrm SOSY 225 mg  225 mg Subcutaneous Once        tezepelumab -ekko (TEZSPIRE ) 210 MG/1. syringe 210 mg  210 mg Subcutaneous Q28 days Iva Marty Saltness, MD   210 mg at 08/19/24 1500      Objective:     Vitals:   09/30/24 0923  BP: 110/60  Pulse: 66  SpO2: 100%  Height: 5' 6 (1.676 m)      Body mass index is 29.21 kg/m.    Physical Exam:     General: Well-appearing, cooperative, sitting comfortably in no acute distress.   OMT Physical Exam:  ASIS Compression Test: Positive Right Cervical: TTP paraspinal, C3-5 RRSR, C7 RLSR Rib: Bilateral elevated first rib with TTP Thoracic: TTP paraspinal, T6 RRSR Lumbar: TTP paraspinal, L5 RL Pelvis: Right anterior innominate  Electronically signed by:  Odis Mace [facility] 10:09 AM 09/30/24

## 2024-09-30 ENCOUNTER — Ambulatory Visit: Admitting: Sports Medicine

## 2024-09-30 VITALS — BP 110/60 | HR 66 | Ht 66.0 in

## 2024-09-30 DIAGNOSIS — M9905 Segmental and somatic dysfunction of pelvic region: Secondary | ICD-10-CM | POA: Diagnosis not present

## 2024-09-30 DIAGNOSIS — M9902 Segmental and somatic dysfunction of thoracic region: Secondary | ICD-10-CM

## 2024-09-30 DIAGNOSIS — Z1231 Encounter for screening mammogram for malignant neoplasm of breast: Secondary | ICD-10-CM | POA: Diagnosis not present

## 2024-09-30 DIAGNOSIS — M546 Pain in thoracic spine: Secondary | ICD-10-CM

## 2024-09-30 DIAGNOSIS — M9903 Segmental and somatic dysfunction of lumbar region: Secondary | ICD-10-CM

## 2024-09-30 DIAGNOSIS — M542 Cervicalgia: Secondary | ICD-10-CM | POA: Diagnosis not present

## 2024-09-30 DIAGNOSIS — M9901 Segmental and somatic dysfunction of cervical region: Secondary | ICD-10-CM

## 2024-09-30 DIAGNOSIS — M9908 Segmental and somatic dysfunction of rib cage: Secondary | ICD-10-CM | POA: Diagnosis not present

## 2024-09-30 DIAGNOSIS — M545 Low back pain, unspecified: Secondary | ICD-10-CM

## 2024-09-30 DIAGNOSIS — G8929 Other chronic pain: Secondary | ICD-10-CM | POA: Diagnosis not present

## 2024-10-03 ENCOUNTER — Other Ambulatory Visit: Payer: Self-pay

## 2024-10-03 DIAGNOSIS — Z01419 Encounter for gynecological examination (general) (routine) without abnormal findings: Secondary | ICD-10-CM | POA: Diagnosis not present

## 2024-10-03 DIAGNOSIS — Z1272 Encounter for screening for malignant neoplasm of vagina: Secondary | ICD-10-CM | POA: Diagnosis not present

## 2024-10-03 DIAGNOSIS — Z79890 Hormone replacement therapy (postmenopausal): Secondary | ICD-10-CM | POA: Diagnosis not present

## 2024-10-03 DIAGNOSIS — Z6829 Body mass index (BMI) 29.0-29.9, adult: Secondary | ICD-10-CM | POA: Diagnosis not present

## 2024-10-03 DIAGNOSIS — N3281 Overactive bladder: Secondary | ICD-10-CM | POA: Diagnosis not present

## 2024-10-03 DIAGNOSIS — Z113 Encounter for screening for infections with a predominantly sexual mode of transmission: Secondary | ICD-10-CM | POA: Diagnosis not present

## 2024-10-03 DIAGNOSIS — N760 Acute vaginitis: Secondary | ICD-10-CM | POA: Diagnosis not present

## 2024-10-04 ENCOUNTER — Other Ambulatory Visit (HOSPITAL_COMMUNITY): Payer: Self-pay

## 2024-10-04 ENCOUNTER — Other Ambulatory Visit: Payer: Self-pay

## 2024-10-04 MED ORDER — ESTRADIOL 0.75 MG/1.25 GM (0.06%) TD GEL
TRANSDERMAL | 4 refills | Status: AC
  Filled 2024-10-04 – 2024-11-06 (×2): qty 37.5, 30d supply, fill #0
  Filled 2024-12-05 – 2024-12-10 (×2): qty 37.5, 30d supply, fill #1

## 2024-10-04 MED ORDER — GEMTESA 75 MG PO TABS: 75.0000 mg | ORAL_TABLET | Freq: Every day | ORAL | 4 refills | Status: AC

## 2024-10-07 ENCOUNTER — Other Ambulatory Visit: Payer: Self-pay

## 2024-10-07 ENCOUNTER — Encounter: Payer: Self-pay | Admitting: Physical Therapy

## 2024-10-07 ENCOUNTER — Other Ambulatory Visit (HOSPITAL_COMMUNITY): Payer: Self-pay

## 2024-10-07 ENCOUNTER — Ambulatory Visit: Admitting: Physical Therapy

## 2024-10-07 DIAGNOSIS — M25551 Pain in right hip: Secondary | ICD-10-CM

## 2024-10-07 DIAGNOSIS — M5459 Other low back pain: Secondary | ICD-10-CM | POA: Diagnosis not present

## 2024-10-07 DIAGNOSIS — M6281 Muscle weakness (generalized): Secondary | ICD-10-CM | POA: Diagnosis not present

## 2024-10-07 DIAGNOSIS — G8929 Other chronic pain: Secondary | ICD-10-CM | POA: Diagnosis not present

## 2024-10-07 DIAGNOSIS — M542 Cervicalgia: Secondary | ICD-10-CM

## 2024-10-07 DIAGNOSIS — M25512 Pain in left shoulder: Secondary | ICD-10-CM | POA: Diagnosis not present

## 2024-10-07 NOTE — Therapy (Signed)
 OUTPATIENT PHYSICAL THERAPY EVALUATION   Patient Name: Donna Park MRN: [id number] DOB:03-Jan-1971, 53 y.o., female Today's Date: 10/07/2024   END OF SESSION:  PT End of Session - 10/07/24 1117     Visit Number 1    Number of Visits 9    Date for Recertification  12/02/24    Authorization Type [facility] [employer]    PT Start Time [phone]    PT Stop Time 1020    PT Time Calculation (min) 49 min    Activity Tolerance Patient tolerated treatment well    Behavior During Therapy WFL for tasks assessed/performed          Past Medical History:  Diagnosis Date   Allergy    Anxiety    Arthritis    Asthma    Chicken pox    Complication of anesthesia    slow to wake up from anesthesia   Depression    DJD (degenerative joint disease)    GERD (gastroesophageal reflux disease)    resolved after gastric surgery   H/O gastric bypass 2016   History of iron deficiency anemia    HSV infection    Migraines    Obese    Pneumonia    Childhood history   PONV (postoperative nausea and vomiting)    Status post dilation of esophageal narrowing    Varicose vein of leg    Past Surgical History:  Procedure Laterality Date   ANKLE FRACTURE SURGERY Left 03/08/2009   BREAST SURGERY Bilateral    Cyst Removal   ESOPHAGEAL MANOMETRY N/A 09/24/2022   Procedure: ESOPHAGEAL MANOMETRY (EM);  Surgeon: San Sandor GAILS, DO;  Location: [facility];  Service: Gastroenterology;  Laterality: N/A;   GASTRIC BYPASS  06/18/2015   LAPAROSCOPIC VAGINAL HYSTERECTOMY WITH SALPINGECTOMY Bilateral 05/15/2020   Procedure: LAPAROSCOPIC ASSISTED VAGINAL HYSTERECTOMY WITH SALPINGECTOMY;  Surgeon: Marget Lenis, MD;  Location: [facility];  Service: Gynecology;  Laterality: Bilateral;  need bed   LASER ABLATION     Varicose veins   TONSILLECTOMY AND ADENOIDECTOMY  2001   TUBAL LIGATION  12/19/1999   UPPER GASTROINTESTINAL ENDOSCOPY     UPPER GI ENDOSCOPY     Uterine ablation  12/2018   Patient Active  Problem List   Diagnosis Date Noted   Osteopenia 09/26/2024   Dysuria 08/14/2023   Dysphagia    LUQ pain 03/01/2022   Change in bowel habits 03/01/2022   Loss of weight 03/01/2022   Chronic migraine without aura without status migrainosus, not intractable 06/14/2021   S/P laparoscopic assisted vaginal hysterectomy (LAVH) 05/15/2020   Migraines    GERD (gastroesophageal reflux disease)    Asthma    Seasonal and perennial allergic rhinitis 02/10/2019   Moderate persistent asthma, uncomplicated 02/10/2019   Anaphylactic shock due to adverse food reaction 02/10/2019   Atopic dermatitis 02/10/2019   History of Roux-en-Y gastric bypass 2016    PCP: Merna Huxley, NP  REFERRING PROVIDER: Joane Artist RAMAN, MD  REFERRING DIAG: Right hip pain; Neck pain  THERAPY DIAG:  Cervicalgia  Chronic left shoulder pain  Other low back pain  Pain in right hip  Muscle weakness (generalized)  Rationale for Evaluation and Treatment: Rehabilitation  ONSET DATE: Chronic   SUBJECTIVE:         SUBJECTIVE STATEMENT: Patient reports 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 She reports L > R neck and shoulder pain, feels like it won't let go. She  also reports lower back pain and right hip pain. She states that some days she can't raise her arm due to the pain.   PERTINENT HISTORY:  See PMH above  PAIN:  Are you having pain? Yes:  NPRS scale: reports 10/10 Pain location: Neck and lower back Pain description: Tight Aggravating factors: Sitting at work, sleeping Relieving factors: Putting pressure over the left neck region  PRECAUTIONS: None  RED FLAGS: None   WEIGHT BEARING RESTRICTIONS: No  FALLS:  Has patient fallen in last 6 months? No  OCCUPATION: Mostly sitting at desk working   PLOF: Independent  PATIENT GOALS: Pain relief   OBJECTIVE:  Note: Objective measures were completed at Evaluation unless otherwise  noted. PATIENT SURVEYS:  PSFS: 6 Reaching / cleaning: 4 Walking comfortably: 6 Walking upright: 8  COGNITION: Overall cognitive status: Within functional limits for tasks assessed  SENSATION: WFL  POSTURE:   Rounded shoulder posture  PALPATION: Tender to palpation bilateral upper trap and periscapular region   CERVICAL ROM:   Active ROM A/PROM (deg) eval  Flexion 50  Extension 35  Right lateral flexion   Left lateral flexion   Right rotation 65  Left rotation 55   (Blank rows = not tested)  UPPER EXTREMITY ROM:  Active ROM Right eval Left eval  Shoulder flexion 155 140  Shoulder extension    Shoulder abduction    Shoulder adduction    Shoulder extension    Shoulder internal rotation    Shoulder external rotation    Elbow flexion    Elbow extension    Wrist flexion    Wrist extension    Wrist ulnar deviation    Wrist radial deviation    Wrist pronation    Wrist supination     (Blank rows = not tested)  UPPER EXTREMITY MMT:  MMT Right eval Left eval  Shoulder flexion 5 4  Shoulder extension    Shoulder abduction    Shoulder adduction    Shoulder extension    Shoulder internal rotation    Shoulder external rotation    Middle trapezius    Lower trapezius    Elbow flexion    Elbow extension    Wrist flexion    Wrist extension    Wrist ulnar deviation    Wrist radial deviation    Wrist pronation    Wrist supination    Grip strength     (Blank rows = not tested)  LUMBAR ROM:   Active  A/PROM  eval  Flexion WFL  Extension 75%  Right lateral flexion 75%  Left lateral flexion 75%  Right rotation WFL  Left rotation 75%   (Blank rows = not tested)   FUNCTIONAL TESTS:  DNF endurance: 6 seconds   TREATMENT [facility] Adult PT Treatment:                                                DATE: 10/07/2024 STM / TPR for bilateral upper trap and periscapular muscles Seated cervical retraction with finger assist at chin  Trigger Point Dry  Needling  Initial Treatment: Pt instructed on Dry Needling rational, procedures, and possible side effects. Pt instructed to expect mild to moderate muscle soreness later in the day and/or into the next day.  Pt instructed in methods to reduce muscle soreness. Pt instructed to continue prescribed HEP. Because Dry Needling  was performed over or adjacent to a lung field, pt was educated on S/S of pneumothorax and to seek immediate medical attention should they occur.  Patient was educated on signs and symptoms of infection and other risk factors and advised to seek medical attention should they occur.  Patient verbalized understanding of these instructions and education.   Patient Verbal Consent Given: Yes Education Handout Provided: Previously Provided Muscles Treated: bilateral upper trap, bilateral suboccipitals, bilateral rhomboid/mid trap region Electrical Stimulation Performed: No Treatment Response/Outcome: Twitch response  Discussed ergonomics and postural correction while seated at work, discussed treatment focused on postural control and addressing lower band and right hip in future sessions   PATIENT EDUCATION:  Education details: Exam findings, POC, HEP Person educated: Patient Education method: Explanation, Demonstration, Tactile cues, Verbal cues, and Handouts Education comprehension: verbalized understanding, returned demonstration, verbal cues required, tactile cues required, and needs further education  HOME EXERCISE PROGRAM: Access Code: [email or web address]    ASSESSMENT: CLINICAL IMPRESSION: Patient is a 53 y.o. female who was seen today for physical therapy evaluation and treatment for chronic neck, shoulder, lower back, and right hip pain. Today's session focused primarily on neck and shoulder pain. She does exhibit limitations with her cervical and left shoulder motion, strength deficit of the left shoulder and DNF endurance impairment. Performed TPDN this visit with good  therapeutic benefit. Will further assess lower back and right hip at future session.    OBJECTIVE IMPAIRMENTS: decreased activity tolerance, decreased ROM, decreased strength, postural dysfunction, and pain.   ACTIVITY LIMITATIONS: carrying, lifting, sitting, sleeping, and reach over head  PARTICIPATION LIMITATIONS: meal prep, cleaning, community activity, and occupation  PERSONAL FACTORS: Fitness, Past/current experiences, and Time since onset of injury/illness/exacerbation are also affecting patient's functional outcome.   REHAB POTENTIAL: Good  CLINICAL DECISION MAKING: Stable/uncomplicated  EVALUATION COMPLEXITY: Low   GOALS: Goals reviewed with patient? Yes  SHORT TERM GOALS: Target date: 11/04/2024  Patient will be I with initial HEP in order to progress with therapy. Baseline: HEP provided at eval Goal status: INITIAL  2.  Patient will report neck and shoulder pain </= 5/10 in order to reduce functional limitations Baseline: reports 10/10 pain Goal status: INITIAL  LONG TERM GOALS: Target date: 12/02/2024  Patient will be I with final HEP to maintain progress from PT. Baseline: HEP provided at eval Goal status: INITIAL  2.  Patient will report PSFS >/= 8 in order to indicate improvement in their functional ability. Baseline: 6 Goal status: INITIAL  3.  Patient will demonstrate cervical rotation >/= 70 deg bilaterally in order to reduce neck tightness and improve functional ability Baseline: see limitations above Goal status: INITIAL  4.  Patient will demonstrate DNF endurance >/= 30 sec to improve postural control and reduce pain with sitting Baseline: 6 seconds Goal status: INITIAL   PLAN: PT FREQUENCY: 1x/week  PT DURATION: 8 weeks  PLANNED INTERVENTIONS: 97164- PT Re-evaluation, 97750- Physical Performance Testing, 97110-Therapeutic exercises, 97530- Therapeutic activity, 97112- Neuromuscular re-education, 97535- Self Care, 02859- Manual therapy, 20560  (1-2 muscles), 20561 (3+ muscles)- Dry Needling, Patient/Family education, Joint mobilization, Joint manipulation, Spinal manipulation, Spinal mobilization, Cryotherapy, and Moist heat  PLAN FOR NEXT SESSION: Review HEP and progress PRN, manual/TPDN for cervical and lumbar region, postural control and DNF endurance, core and hip strengthening   Elaine Daring, PT, DPT, LAT, ATC 10/07/24  11:30 AM Phone: [phone] Fax: [phone]

## 2024-10-07 NOTE — Patient Instructions (Signed)
 Access Code: [email or web address] URL: [email or web address] Date: 10/07/2024 Prepared by: Elaine Daring  Exercises - Seated Passive Cervical Retraction  - 2-3 x daily - 10 reps - 3 seconds hold

## 2024-10-17 ENCOUNTER — Ambulatory Visit: Admitting: Family Medicine

## 2024-10-17 ENCOUNTER — Ambulatory Visit: Admitting: Neurology

## 2024-10-18 ENCOUNTER — Other Ambulatory Visit: Payer: Self-pay

## 2024-10-21 ENCOUNTER — Other Ambulatory Visit: Payer: Self-pay

## 2024-10-21 ENCOUNTER — Ambulatory Visit: Admitting: Physical Therapy

## 2024-10-21 ENCOUNTER — Encounter: Payer: Self-pay | Admitting: Physical Therapy

## 2024-10-21 DIAGNOSIS — G8929 Other chronic pain: Secondary | ICD-10-CM | POA: Diagnosis not present

## 2024-10-21 DIAGNOSIS — M5459 Other low back pain: Secondary | ICD-10-CM

## 2024-10-21 DIAGNOSIS — M25551 Pain in right hip: Secondary | ICD-10-CM | POA: Diagnosis not present

## 2024-10-21 DIAGNOSIS — M542 Cervicalgia: Secondary | ICD-10-CM

## 2024-10-21 DIAGNOSIS — M6281 Muscle weakness (generalized): Secondary | ICD-10-CM

## 2024-10-21 DIAGNOSIS — M25512 Pain in left shoulder: Secondary | ICD-10-CM

## 2024-10-21 NOTE — Therapy (Signed)
 OUTPATIENT PHYSICAL THERAPY TREATMENT   Patient Name: Donna Park MRN: [id number] DOB:[birth year], 53 y.o., female Today's Date: 10/21/2024   END OF SESSION:  PT End of Session - 10/21/24 0940     Visit Number 2    Number of Visits 9    Date for Recertification  12/02/24    Authorization Type [facility] [employer]    PT Start Time [phone]    PT Stop Time 1018    PT Time Calculation (min) 43 min    Activity Tolerance Patient tolerated treatment well    Behavior During Therapy WFL for tasks assessed/performed           Past Medical History:  Diagnosis Date   Allergy    Anxiety    Arthritis    Asthma    Chicken pox    Complication of anesthesia    slow to wake up from anesthesia   Depression    DJD (degenerative joint disease)    GERD (gastroesophageal reflux disease)    resolved after gastric surgery   H/O gastric bypass 2016   History of iron deficiency anemia    HSV infection    Migraines    Obese    Pneumonia    Childhood history   PONV (postoperative nausea and vomiting)    Status post dilation of esophageal narrowing    Varicose vein of leg    Past Surgical History:  Procedure Laterality Date   ANKLE FRACTURE SURGERY Left 03/08/2009   BREAST SURGERY Bilateral    Cyst Removal   ESOPHAGEAL MANOMETRY N/A 09/24/2022   Procedure: ESOPHAGEAL MANOMETRY (EM);  Surgeon: San Sandor GAILS, DO;  Location: [facility];  Service: Gastroenterology;  Laterality: N/A;   GASTRIC BYPASS  06/18/2015   LAPAROSCOPIC VAGINAL HYSTERECTOMY WITH SALPINGECTOMY Bilateral 05/15/2020   Procedure: LAPAROSCOPIC ASSISTED VAGINAL HYSTERECTOMY WITH SALPINGECTOMY;  Surgeon: Marget Lenis, MD;  Location: [facility];  Service: Gynecology;  Laterality: Bilateral;  need bed   LASER ABLATION     Varicose veins   TONSILLECTOMY AND ADENOIDECTOMY  2001   TUBAL LIGATION  12/19/1999   UPPER GASTROINTESTINAL ENDOSCOPY     UPPER GI ENDOSCOPY     Uterine ablation  12/2018   Patient Active  Problem List   Diagnosis Date Noted   Osteopenia 09/26/2024   Dysuria 08/14/2023   Dysphagia    LUQ pain 03/01/2022   Change in bowel habits 03/01/2022   Loss of weight 03/01/2022   Chronic migraine without aura without status migrainosus, not intractable 06/14/2021   S/P laparoscopic assisted vaginal hysterectomy (LAVH) 05/15/2020   Migraines    GERD (gastroesophageal reflux disease)    Asthma    Seasonal and perennial allergic rhinitis 02/10/2019   Moderate persistent asthma, uncomplicated 02/10/2019   Anaphylactic shock due to adverse food reaction 02/10/2019   Atopic dermatitis 02/10/2019   History of Roux-en-Y gastric bypass 2016    PCP: Merna Huxley, NP  REFERRING PROVIDER: Joane Artist RAMAN, MD  REFERRING DIAG: Right hip pain; Neck pain  THERAPY DIAG:  Cervicalgia  Chronic left shoulder pain  Other low back pain  Pain in right hip  Muscle weakness (generalized)  Rationale for Evaluation and Treatment: Rehabilitation  ONSET DATE: Chronic   SUBJECTIVE:         SUBJECTIVE STATEMENT: Patient reports she felt good for about a week after last visit but once she sat back down at the desk the pain came back.   Eval: Patient reports she recently moved back into  the office and the chair she was using aggravates her neck and back. She reports L > R neck and shoulder pain, feels like it won't let go. She also reports lower back pain and right hip pain. She states that some days she can't raise her arm due to the pain.   PERTINENT HISTORY:  See PMH above  PAIN:  Are you having pain? Yes:  NPRS scale: reports 8/10 Pain location: Neck and lower back Pain description: Tight Aggravating factors: Sitting at work, sleeping Relieving factors: Putting pressure over the left neck region  PRECAUTIONS: None  PATIENT GOALS: Pain relief   OBJECTIVE:  Note: Objective measures were completed at Evaluation unless otherwise noted. PATIENT SURVEYS:  PSFS: 6 Reaching /  cleaning: 4 Walking comfortably: 6 Walking upright: 8  POSTURE:   Rounded shoulder posture  PALPATION: Tender to palpation bilateral upper trap and periscapular region   CERVICAL ROM:   Active ROM A/PROM (deg) eval   10/21/2024  Flexion 50   Extension 35   Right lateral flexion    Left lateral flexion    Right rotation 65 70  Left rotation 55 65   (Blank rows = not tested)  UPPER EXTREMITY ROM:  Active ROM Right eval Left eval  Shoulder flexion 155 140  Shoulder extension    Shoulder abduction    Shoulder adduction    Shoulder extension    Shoulder internal rotation    Shoulder external rotation    Elbow flexion    Elbow extension    Wrist flexion    Wrist extension    Wrist ulnar deviation    Wrist radial deviation    Wrist pronation    Wrist supination     (Blank rows = not tested)  UPPER EXTREMITY MMT:  MMT Right eval Left eval  Shoulder flexion 5 4  Shoulder extension    Shoulder abduction    Shoulder adduction    Shoulder extension    Shoulder internal rotation    Shoulder external rotation    Middle trapezius    Lower trapezius    Elbow flexion    Elbow extension    Wrist flexion    Wrist extension    Wrist ulnar deviation    Wrist radial deviation    Wrist pronation    Wrist supination    Grip strength     (Blank rows = not tested)  LUMBAR ROM:   Active  A/PROM  eval  Flexion WFL  Extension 75%  Right lateral flexion 75%  Left lateral flexion 75%  Right rotation WFL  Left rotation 75%   (Blank rows = not tested)   FUNCTIONAL TESTS:  DNF endurance: 6 seconds   TREATMENT [facility] Adult PT Treatment:                                                DATE: 10/21/2024 STM / TPR for bilateral upper trap and periscapular muscles Prone upper cervical PA mobs Prone CT junction PA mobs Prone upper-mid thoracic PA thrust manipulation Supine suboccipital release with gentle manual traction Seated cervical retraction with finger assist  at chin 10 x 3 sec  Trigger Point Dry Needling  Subsequent Treatment: Instructions provided previously at initial dry needling treatment.  Instructions reviewed, if requested by the patient, prior to subsequent dry needling treatment.   Patient Verbal Consent Given:  Yes Education Handout Provided: Previously Provided Muscles Treated: bilateral upper trap, bilateral suboccipitals, bilateral rhomboid/mid trap region Electrical Stimulation Performed: No Treatment Response/Outcome: Twitch response  Discussed current migraine symptoms and relation to cervical spine contributing to her headaches. Continued to discuss ergonomics at work.   PATIENT EDUCATION:  Education details: TPDN, HEP Person educated: Patient Education method: Explanation, Demonstration, Tactile cues, Verbal cues, and Handouts Education comprehension: verbalized understanding, returned demonstration, verbal cues required, tactile cues required, and needs further education  HOME EXERCISE PROGRAM: Access Code: [email or web address]    ASSESSMENT: CLINICAL IMPRESSION: Patient tolerated therapy well with no adverse effects. Continued with TPDN for bilateral cervical and parascapular region with good therapeutic benefit. Incorporated more manual therapy to improve her upper cervical and thoracic mobility and patient does exhibit improvement in her cervical range of motion this visit. She does report improvement in symptoms following therapy. No changes made to her HEP this visit. Patient would benefit from continued skilled PT to progress mobility and strength in order to reduce pain and maximize functional ability.   Eval: Patient is a 53 y.o. female who was seen today for physical therapy evaluation and treatment for chronic neck, shoulder, lower back, and right hip pain. Today's session focused primarily on neck and shoulder pain. She does exhibit limitations with her cervical and left shoulder motion, strength deficit of the left  shoulder and DNF endurance impairment. Performed TPDN this visit with good therapeutic benefit. Will further assess lower back and right hip at future session.    OBJECTIVE IMPAIRMENTS: decreased activity tolerance, decreased ROM, decreased strength, postural dysfunction, and pain.   ACTIVITY LIMITATIONS: carrying, lifting, sitting, sleeping, and reach over head  PARTICIPATION LIMITATIONS: meal prep, cleaning, community activity, and occupation  PERSONAL FACTORS: Fitness, Past/current experiences, and Time since onset of injury/illness/exacerbation are also affecting patient's functional outcome.    GOALS: Goals reviewed with patient? Yes  SHORT TERM GOALS: Target date: 11/04/2024  Patient will be I with initial HEP in order to progress with therapy. Baseline: HEP provided at eval Goal status: INITIAL  2.  Patient will report neck and shoulder pain </= 5/10 in order to reduce functional limitations Baseline: reports 10/10 pain Goal status: INITIAL  LONG TERM GOALS: Target date: 12/02/2024  Patient will be I with final HEP to maintain progress from PT. Baseline: HEP provided at eval Goal status: INITIAL  2.  Patient will report PSFS >/= 8 in order to indicate improvement in their functional ability. Baseline: 6 Goal status: INITIAL  3.  Patient will demonstrate cervical rotation >/= 70 deg bilaterally in order to reduce neck tightness and improve functional ability Baseline: see limitations above Goal status: INITIAL  4.  Patient will demonstrate DNF endurance >/= 30 sec to improve postural control and reduce pain with sitting Baseline: 6 seconds Goal status: INITIAL   PLAN: PT FREQUENCY: 1x/week  PT DURATION: 8 weeks  PLANNED INTERVENTIONS: 97164- PT Re-evaluation, 97750- Physical Performance Testing, 97110-Therapeutic exercises, 97530- Therapeutic activity, 97112- Neuromuscular re-education, 97535- Self Care, 02859- Manual therapy, 20560 (1-2 muscles), 20561 (3+  muscles)- Dry Needling, Patient/Family education, Joint mobilization, Joint manipulation, Spinal manipulation, Spinal mobilization, Cryotherapy, and Moist heat  PLAN FOR NEXT SESSION: Review HEP and progress PRN, manual/TPDN for cervical and lumbar region, postural control and DNF endurance, core and hip strengthening   Elaine Daring, PT, DPT, LAT, ATC 10/21/24  12:01 PM Phone: [phone] Fax: [phone]

## 2024-10-24 ENCOUNTER — Other Ambulatory Visit: Payer: Self-pay

## 2024-10-27 NOTE — Progress Notes (Signed)
 Donna Park D.[facility] [address] [place] [address] Phone: [phone]   Assessment and Plan:     1. Neck pain (Primary) 2. Chronic bilateral thoracic back pain 3. Chronic bilateral low back pain without sciatica 4. Somatic dysfunction of cervical region 5. Somatic dysfunction of thoracic region 6. Somatic dysfunction of lumbar region 7. Somatic dysfunction of rib region 8. Somatic dysfunction of pelvic region -Chronic with exacerbation, subsequent visit - Recurrence of multiple areas of musculoskeletal pain with primary pain in neck and bilateral hips - Patient has noticed improvement with OMT, HEP, physical therapy, meloxicam  as needed, dry needling, trigger point injections - Continue HEP and physical therapy and dry needling - Use meloxicam  15 mg daily as needed for breakthrough pain.  Recommend limiting chronic NSAIDs to 1-2 doses per week to prevent long-term side effects. Use Tylenol  500 to 1000 mg tablets 2-3 times a day as needed for day-to-day pain relief.    - Patient has received relief with OMT in the past.  Elects for repeat OMT today.  Tolerated well per note below. - Decision today to treat with OMT was based on Physical Exam   After verbal consent patient was treated with HVLA (high velocity low amplitude), ME (muscle energy), FPR (flex positional release), ST (soft tissue), PC/PD (Pelvic Compression/ Pelvic Decompression) techniques in cervical, rib, thoracic, lumbar, and pelvic areas. Patient tolerated the procedure well with improvement in symptoms.  Patient educated on potential side effects of soreness and recommended to rest, hydrate, and use Tylenol  as needed for pain control.   Pertinent previous records reviewed include none  Follow Up: 2 to 4 weeks for reevaluation.  Last Kenalog  trigger point injections performed on 08/26/2024, so would recommend waiting until 11/25/2024 or later before repeating.   Subjective:   I,  Donna Park, am serving as a neurosurgeon for [employer]   Chief Complaint: neck pain    HPI:    08/07/2023 Patient states her low back and neck are flared and here for adjustment    09/04/2023 Patient states that her upper trap is tight , right shoulder tightness and pain, low back flare of tightness she isnt able to stand up straight    10/09/2023 Patient states that she is tight. Has been trying to do some stretches.    10/16/2023 Patient states neck  and shoulder flare    11/13/2023 Patient states thoracic and cervical tightness/stiffness     11/26/2023 Patient states anterior shoulder/ clavicle pain. Decreased ROM. Ibu doesn't help . 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 Heat helps. Pain for about 2 weeks. Would like to know if she can get a refill of fosamax     01/29/2024 Patient states right shoulder is still in pain,  the pain is deep. Neck is alright for the moment but can feel it trying to pull    02/26/2024 Patient states neck is good , just here for an adjustment low back and hips are in pain    03/18/2024 Patient states right hip is in pain. So in turn left hip is in pain. Neck is alright. Shoulder is still flared   04/01/2024 Patient states injections for neck and down the bad.    04/15/2024 Patient states back is great. No pain in the neck. Just an adjustment today. Right hip is tight and left ankle feels like its compacting and she can feel  the metal.    05/13/2024 Patient states ready for an adjustment. Low back is really tight    05/27/2024 Patient states back and shoulders are tight    06/17/2024 Patient states low back and neck flare   07/15/2024 Patient states ready for adjustment    08/11/2024 Patient states low back and neck are flared. Whatever you did last time helped    08/26/2024 Patient states ready for injections. Back and neck tightness    09/09/2024 Patient states  neck tightness in one spot. Trap tightness just an adjustment    09/30/2024 Patient states neck and shoulders hurt today in the same area as usual.  10/28/2024 Patient states that she would like adjustment. Wants injection as next visit.    Relevant Historical Information: Migraines, GERD, history of Roux-en-Y gastric bypass  Additional pertinent review of systems negative.  Current Outpatient Medications  Medication Sig Dispense Refill   acetaminophen  (TYLENOL ) 500 MG tablet Take by mouth.     acyclovir  ointment (ZOVIRAX ) 5 % Apply onto the affected area every 3 hours. 15 g 1   albuterol  (VENTOLIN  HFA) 108 (90 Base) MCG/ACT inhaler Inhale 1 - 2 puffs into the lungs every 6 hours as needed for wheezing or shortness of breath. 20.1 g 0   amitriptyline  (ELAVIL ) 10 MG tablet Take 1 tablet (10 mg total) by mouth at bedtime. 90 tablet 1   Azelastine -Fluticasone  (DYMISTA ) 137-50 MCG/ACT SUSP Place 2 sprays into both nostrils 2 (two) times daily as needed. 23 g 1   botulinum toxin Type A  (BOTOX ) 200 units injection Provider to inject 155 units into the muscles of the head and neck every 12 weeks. Discard remainder. 1 each 3   CALCIUM PO Take 1 tablet by mouth daily in the afternoon.     cetirizine  (ZYRTEC ) 10 MG tablet Take 2 tablets (20 mg total) by mouth 2 (two) times daily. 360 tablet 1   diazepam  (VALIUM ) 10 MG tablet Take 1 tablet (10 mg total) by mouth at bedtime as needed for sleep. 30 tablet 2   EPINEPHrine  (NEFFY ) 2 MG/0.1ML SOLN Place 2 mg into the nose 3 times/day as needed-between meals & bedtime. 6 each 1   Estradiol  (ESTROGEL ) 0.75 MG/1.25 GM (0.06%) topical gel Apply 0.5 pumps every day by topical route. 37.5 g 4   fluconazole  (DIFLUCAN ) 150 MG tablet Take 1 tablet (150 mg total) by mouth daily. 14 tablet 5   Fluticasone -Umeclidin-Vilant (TRELEGY ELLIPTA ) 200-62.5-25 MCG/ACT AEPB Inhale 1 puff into the lungs daily. 180 each 1   Fremanezumab -vfrm (AJOVY ) 225 MG/1.5ML SOAJ Inject 225  mg into the skin every 30 (thirty) days. 1.5 mL 11   Hypertonic Nasal Wash (SINUS RINSE REFILL) PACK Use one packet dissolved in water as needed. (Patient taking differently: Place 1 each into the nose in the morning and at bedtime.) 100 each 5   ibuprofen  (ADVIL ) 800 MG tablet Take 1 tablet (800 mg total) by mouth 3 (three) times daily as needed (pain.). 270 tablet 3   ipratropium (ATROVENT ) 0.06 % nasal spray Place 2 sprays into both nostrils 4 (four) times daily. 45 mL 5   linaclotide  (LINZESS ) 290 MCG CAPS capsule Take 1 capsule (290 mcg total) by mouth daily before breakfast. Patient needs follow up appointment for future refills. Please call [phone] to schedule an appointment. 90 capsule 4   meloxicam  (MOBIC ) 15 MG tablet Take 1 tablet (15 mg total) by mouth at bedtime as needed for pain. 90 tablet 3   methylPREDNISolone  (MEDROL  DOSEPAK)  4 MG TBPK tablet Take pills daily all together with food. Take the first dose (6 pills) as soon as possible. Take the rest each morning. For 6 days total 6-5-4-3-2-1. 21 tablet 1   nystatin  (MYCOSTATIN ) 100000 UNIT/ML suspension Take 5 mLs (500,000 Units total) by mouth 4 (four) times daily. Gargle and spit. 473 mL 0   ondansetron  (ZOFRAN -ODT) 4 MG disintegrating tablet Dissolve 1-2 tablets (4-8 mg total) by mouth every 8 (eight) hours as needed. May take with Rizatriptan  90 tablet 3   pantoprazole  (PROTONIX ) 40 MG tablet Take 1 tablet (40 mg total) by mouth at bedtime. 90 tablet 3   Pediatric Multiple Vit-C-FA (MULTIVITAMIN ANIMAL SHAPES, WITH CA/FA,) with C & FA chewable tablet Chew 2 tablets by mouth daily. Bariactric advatage     Rimegepant Sulfate  (NURTEC) 75 MG TBDP Take 1 tablet (75 mg total) by mouth daily as needed. 16 tablet 11   risedronate  (ACTONEL ) 150 MG tablet Take 1 tablet (150 mg total) by mouth every 30 (thirty) days. with water on empty stomach, nothing by mouth or lie down for next 30 minutes. 12 tablet 1   rizatriptan  (MAXALT -MLT) 10 MG  disintegrating tablet Dissolve 1 tablet (10 mg total) by mouth as needed at onset of migraine. May take another tablet 2 hours later if needed, max 2 tablets in 24 hours. 27 tablet 3   simethicone  (MYLICON) 80 MG chewable tablet Chew 80 mg by mouth every 6 (six) hours as needed for flatulence.     tezepelumab -ekko (TEZSPIRE ) 210 MG/1. syringe Inject 1.91 mLs (210 mg total) into the skin every 28 (twenty-eight) days. 1.91 mL 11   tizanidine  (ZANAFLEX ) 6 MG capsule Take 1 capsule (6 mg total) by mouth at bedtime. 90 capsule 3   topiramate  (TOPAMAX ) 25 MG tablet Take 1 tablet (25 mg total) by mouth at bedtime. 30 tablet 6   triamcinolone  ointment (KENALOG ) 0.1 % Apply topically 2 (two) times daily. 90 g 0   valACYclovir  (VALTREX ) 500 MG tablet Take 2 tablets (1,000 mg total) by mouth 2 (two) times daily. 360 tablet 0   Vibegron  (GEMTESA ) 75 MG TABS Take 1 tablet (75 mg total) by mouth daily. 90 tablet 4   Current Facility-Administered Medications  Medication Dose Route Frequency Provider Last Rate Last Admin   Fremanezumab -vfrm SOSY 225 mg  225 mg Subcutaneous Once        tezepelumab -ekko (TEZSPIRE ) 210 MG/1. syringe 210 mg  210 mg Subcutaneous Q28 days Iva Marty Saltness, MD   210 mg at 08/19/24 1500      Objective:     Vitals:   10/28/24 0934  BP: 118/72  Pulse: (!) 52  SpO2: 99%  Height: 5' 6 (1.676 m)      Body mass index is 29.21 kg/m.    Physical Exam:     General: Well-appearing, cooperative, sitting comfortably in no acute distress.   OMT Physical Exam:  ASIS Compression Test: Positive Right Cervical: TTP paraspinal, C3 RRSL, C6 RLSR Rib: Bilateral elevated first rib with TTP Thoracic: TTP paraspinal, T6 RRSR Lumbar: TTP paraspinal, L2 RLSL Pelvis: Right anterior innominate  Electronically signed by:  Odis Mace [facility] 11:10 AM 10/28/24

## 2024-10-28 ENCOUNTER — Ambulatory Visit: Admitting: Sports Medicine

## 2024-10-28 ENCOUNTER — Ambulatory Visit (INDEPENDENT_AMBULATORY_CARE_PROVIDER_SITE_OTHER): Admitting: Physical Therapy

## 2024-10-28 ENCOUNTER — Other Ambulatory Visit: Payer: Self-pay

## 2024-10-28 ENCOUNTER — Encounter: Payer: Self-pay | Admitting: Physical Therapy

## 2024-10-28 VITALS — BP 118/72 | HR 52 | Ht 66.0 in

## 2024-10-28 DIAGNOSIS — M9902 Segmental and somatic dysfunction of thoracic region: Secondary | ICD-10-CM | POA: Diagnosis not present

## 2024-10-28 DIAGNOSIS — M9908 Segmental and somatic dysfunction of rib cage: Secondary | ICD-10-CM | POA: Diagnosis not present

## 2024-10-28 DIAGNOSIS — M542 Cervicalgia: Secondary | ICD-10-CM

## 2024-10-28 DIAGNOSIS — M545 Low back pain, unspecified: Secondary | ICD-10-CM

## 2024-10-28 DIAGNOSIS — M6281 Muscle weakness (generalized): Secondary | ICD-10-CM | POA: Diagnosis not present

## 2024-10-28 DIAGNOSIS — M25551 Pain in right hip: Secondary | ICD-10-CM

## 2024-10-28 DIAGNOSIS — M9905 Segmental and somatic dysfunction of pelvic region: Secondary | ICD-10-CM | POA: Diagnosis not present

## 2024-10-28 DIAGNOSIS — M546 Pain in thoracic spine: Secondary | ICD-10-CM | POA: Diagnosis not present

## 2024-10-28 DIAGNOSIS — G8929 Other chronic pain: Secondary | ICD-10-CM | POA: Diagnosis not present

## 2024-10-28 DIAGNOSIS — M25512 Pain in left shoulder: Secondary | ICD-10-CM | POA: Diagnosis not present

## 2024-10-28 DIAGNOSIS — M9901 Segmental and somatic dysfunction of cervical region: Secondary | ICD-10-CM

## 2024-10-28 DIAGNOSIS — M5459 Other low back pain: Secondary | ICD-10-CM

## 2024-10-28 DIAGNOSIS — M9903 Segmental and somatic dysfunction of lumbar region: Secondary | ICD-10-CM

## 2024-10-28 NOTE — Patient Instructions (Signed)
 See me again on 12/19 or later for steroid trigger points You can see me sooner for lidocaine  trigger points or OMT

## 2024-10-28 NOTE — Patient Instructions (Signed)
 Access Code: [email or web address] URL: [email or web address] Date: 10/28/2024 Prepared by: Elaine Daring  Exercises - Seated Passive Cervical Retraction  - 2-3 x daily - 10 reps - 3 seconds hold - Supine Shoulder Horizontal Abduction with Resistance  - 1 x daily - 3 sets - 5 reps - Sidelying Thoracic Lumbar Rotation  - 1 x daily - 2 sets - 5 reps - Clam  - 1 x daily - 3 sets - 10 reps

## 2024-10-28 NOTE — Therapy (Signed)
 OUTPATIENT PHYSICAL THERAPY TREATMENT   Patient Name: Donna Park MRN: [id number] DOB:[birth year], 53 y.o., female Today's Date: 10/28/2024   END OF SESSION:  PT End of Session - 10/28/24 0934     Visit Number 3    Number of Visits 9    Date for Recertification  12/02/24    Authorization Type [facility] [employer]    PT Start Time [phone]    PT Stop Time 1015    PT Time Calculation (min) 40 min    Activity Tolerance Patient tolerated treatment well    Behavior During Therapy WFL for tasks assessed/performed            Past Medical History:  Diagnosis Date   Allergy    Anxiety    Arthritis    Asthma    Chicken pox    Complication of anesthesia    slow to wake up from anesthesia   Depression    DJD (degenerative joint disease)    GERD (gastroesophageal reflux disease)    resolved after gastric surgery   H/O gastric bypass 2016   History of iron deficiency anemia    HSV infection    Migraines    Obese    Pneumonia    Childhood history   PONV (postoperative nausea and vomiting)    Status post dilation of esophageal narrowing    Varicose vein of leg    Past Surgical History:  Procedure Laterality Date   ANKLE FRACTURE SURGERY Left 03/08/2009   BREAST SURGERY Bilateral    Cyst Removal   ESOPHAGEAL MANOMETRY N/A 09/24/2022   Procedure: ESOPHAGEAL MANOMETRY (EM);  Surgeon: San Sandor GAILS, DO;  Location: [facility];  Service: Gastroenterology;  Laterality: N/A;   GASTRIC BYPASS  06/18/2015   LAPAROSCOPIC VAGINAL HYSTERECTOMY WITH SALPINGECTOMY Bilateral 05/15/2020   Procedure: LAPAROSCOPIC ASSISTED VAGINAL HYSTERECTOMY WITH SALPINGECTOMY;  Surgeon: Marget Lenis, MD;  Location: [facility];  Service: Gynecology;  Laterality: Bilateral;  need bed   LASER ABLATION     Varicose veins   TONSILLECTOMY AND ADENOIDECTOMY  2001   TUBAL LIGATION  12/19/1999   UPPER GASTROINTESTINAL ENDOSCOPY     UPPER GI ENDOSCOPY     Uterine ablation  12/2018   Patient  Active Problem List   Diagnosis Date Noted   Osteopenia 09/26/2024   Dysuria 08/14/2023   Dysphagia    LUQ pain 03/01/2022   Change in bowel habits 03/01/2022   Loss of weight 03/01/2022   Chronic migraine without aura without status migrainosus, not intractable 06/14/2021   S/P laparoscopic assisted vaginal hysterectomy (LAVH) 05/15/2020   Migraines    GERD (gastroesophageal reflux disease)    Asthma    Seasonal and perennial allergic rhinitis 02/10/2019   Moderate persistent asthma, uncomplicated 02/10/2019   Anaphylactic shock due to adverse food reaction 02/10/2019   Atopic dermatitis 02/10/2019   History of Roux-en-Y gastric bypass 2016    PCP: Merna Huxley, NP  REFERRING PROVIDER: Joane Artist RAMAN, MD  REFERRING DIAG: Right hip pain; Neck pain  THERAPY DIAG:  Cervicalgia  Chronic left shoulder pain  Other low back pain  Pain in right hip  Muscle weakness (generalized)  Rationale for Evaluation and Treatment: Rehabilitation  ONSET DATE: Chronic   SUBJECTIVE:         SUBJECTIVE STATEMENT: Patient reports the neck is still hurting. The pain from last visit subsided until Monday when she went back to work. With the lower back, she has to hold on to something to bend  and reach for something low, or get on her knees because she can't bend over for long. Pain in the lower back is across the back but also on the left hip region.   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 She reports L > R neck and shoulder pain, feels like it won't let go. She also reports lower back pain and right hip pain. She states that some days she can't raise her arm due to the pain.   PERTINENT HISTORY:  See PMH above  PAIN:  Are you having pain? Yes:  NPRS scale: reports 7/10 Pain location: Neck and lower back Pain description: Tight Aggravating factors: Sitting at work, sleeping Relieving factors: Putting  pressure over the left neck region  PRECAUTIONS: None  PATIENT GOALS: Pain relief   OBJECTIVE:  Note: Objective measures were completed at Evaluation unless otherwise noted. PATIENT SURVEYS:  PSFS: 6 Reaching / cleaning: 4 Walking comfortably: 6 Walking upright: 8  POSTURE:   Rounded shoulder posture  PALPATION: Tender to palpation bilateral upper trap and periscapular region   CERVICAL ROM:   Active ROM A/PROM (deg) eval   10/21/2024  Flexion 50   Extension 35   Right lateral flexion    Left lateral flexion    Right rotation 65 70  Left rotation 55 65   (Blank rows = not tested)  UPPER EXTREMITY ROM:  Active ROM Right eval Left eval  Shoulder flexion 155 140  Shoulder extension    Shoulder abduction    Shoulder adduction    Shoulder extension    Shoulder internal rotation    Shoulder external rotation    Elbow flexion    Elbow extension    Wrist flexion    Wrist extension    Wrist ulnar deviation    Wrist radial deviation    Wrist pronation    Wrist supination     (Blank rows = not tested)  UPPER EXTREMITY MMT:  MMT Right eval Left eval  Shoulder flexion 5 4  Shoulder extension    Shoulder abduction    Shoulder adduction    Shoulder extension    Shoulder internal rotation    Shoulder external rotation    Middle trapezius    Lower trapezius    Elbow flexion    Elbow extension    Wrist flexion    Wrist extension    Wrist ulnar deviation    Wrist radial deviation    Wrist pronation    Wrist supination    Grip strength     (Blank rows = not tested)  LUMBAR ROM:   Active  A/PROM  eval  Flexion WFL  Extension 75%  Right lateral flexion 75%  Left lateral flexion 75%  Right rotation WFL  Left rotation 75%   (Blank rows = not tested)   FUNCTIONAL TESTS:  DNF endurance: 6 seconds   TREATMENT [facility] Adult PT Treatment:                                                DATE: 10/28/2024 STM / TPR for left upper trap, levator scap,  rhomboid, cerivcal paraspinals Prone upper cervical PA mobs Prone CT junction PA mobs Supine suboccipital release with gentle manual traction Supine cervical side glide mobs Supine horizontal abduction with  yellow combine with cervical rotation 3 x 3 rotations each Sidelying thoracic rotation x 5 each Sidelying clamshell 2 x 10 each Seated cervical retraction with finger assist at chin 10 x 3 sec  PATIENT EDUCATION:  Education details: HEP update Person educated: Patient Education method: Explanation, Demonstration, Tactile cues, Verbal cues, and Handouts Education comprehension: verbalized understanding, returned demonstration, verbal cues required, tactile cues required, and needs further education  HOME EXERCISE PROGRAM: Access Code: [email or web address]    ASSESSMENT: CLINICAL IMPRESSION: Patient tolerated therapy well with no adverse effects. Therapy focused on reducing muscular tension and improving spinal mobility of the cervical and thoracic spine region, and progressing postural control and mobility exercises. Utilized pattern assist to engage postural muscles and improve her cervical motion and flexibility. Also incorporated gluteal strengthening with clamshell with good tolerance. Updated her HEP to progress postural control and strengthening for home. Patient would benefit from continued skilled PT to progress mobility and strength in order to reduce pain and maximize functional ability.   Eval: Patient is a 53 y.o. female who was seen today for physical therapy evaluation and treatment for chronic neck, shoulder, lower back, and right hip pain. Today's session focused primarily on neck and shoulder pain. She does exhibit limitations with her cervical and left shoulder motion, strength deficit of the left shoulder and DNF endurance impairment. Performed TPDN this visit with good therapeutic benefit. Will further assess lower back and right hip at future session.    OBJECTIVE IMPAIRMENTS:  decreased activity tolerance, decreased ROM, decreased strength, postural dysfunction, and pain.   ACTIVITY LIMITATIONS: carrying, lifting, sitting, sleeping, and reach over head  PARTICIPATION LIMITATIONS: meal prep, cleaning, community activity, and occupation  PERSONAL FACTORS: Fitness, Past/current experiences, and Time since onset of injury/illness/exacerbation are also affecting patient's functional outcome.    GOALS: Goals reviewed with patient? Yes  SHORT TERM GOALS: Target date: 11/04/2024  Patient will be I with initial HEP in order to progress with therapy. Baseline: HEP provided at eval Goal status: INITIAL  2.  Patient will report neck and shoulder pain </= 5/10 in order to reduce functional limitations Baseline: reports 10/10 pain Goal status: INITIAL  LONG TERM GOALS: Target date: 12/02/2024  Patient will be I with final HEP to maintain progress from PT. Baseline: HEP provided at eval Goal status: INITIAL  2.  Patient will report PSFS >/= 8 in order to indicate improvement in their functional ability. Baseline: 6 Goal status: INITIAL  3.  Patient will demonstrate cervical rotation >/= 70 deg bilaterally in order to reduce neck tightness and improve functional ability Baseline: see limitations above Goal status: INITIAL  4.  Patient will demonstrate DNF endurance >/= 30 sec to improve postural control and reduce pain with sitting Baseline: 6 seconds Goal status: INITIAL   PLAN: PT FREQUENCY: 1x/week  PT DURATION: 8 weeks  PLANNED INTERVENTIONS: 97164- PT Re-evaluation, 97750- Physical Performance Testing, 97110-Therapeutic exercises, 97530- Therapeutic activity, 97112- Neuromuscular re-education, 97535- Self Care, 02859- Manual therapy, 20560 (1-2 muscles), 20561 (3+ muscles)- Dry Needling, Patient/Family education, Joint mobilization, Joint manipulation, Spinal manipulation, Spinal mobilization, Cryotherapy, and Moist heat  PLAN FOR NEXT SESSION: Review  HEP and progress PRN, manual/TPDN for cervical and lumbar region, postural control and DNF endurance, core and hip strengthening   Elaine Daring, PT, DPT, LAT, ATC 10/28/24  10:35 AM Phone: [phone] Fax: [phone]

## 2024-10-31 ENCOUNTER — Other Ambulatory Visit (HOSPITAL_COMMUNITY): Payer: Self-pay

## 2024-10-31 ENCOUNTER — Other Ambulatory Visit: Payer: Self-pay

## 2024-10-31 NOTE — Progress Notes (Signed)
 Specialty Pharmacy Refill Coordination Note  Donna Park is a 53 y.o. female assessed today regarding refills of clinic administered specialty medication(s) OnabotulinumtoxinA  (Botox )   Clinic requested Courier to Provider Office   Delivery date: 11/01/24   Verified address: [facility] [address] [place],  [place] [address]   Medication will be filled on: 10/31/24

## 2024-11-01 ENCOUNTER — Other Ambulatory Visit: Payer: Self-pay

## 2024-11-01 ENCOUNTER — Encounter: Payer: Self-pay | Admitting: Allergy & Immunology

## 2024-11-01 ENCOUNTER — Ambulatory Visit: Admitting: Allergy & Immunology

## 2024-11-01 ENCOUNTER — Ambulatory Visit

## 2024-11-01 ENCOUNTER — Other Ambulatory Visit (HOSPITAL_COMMUNITY): Payer: Self-pay

## 2024-11-01 VITALS — BP 110/72 | HR 90 | Temp 98.0°F | Ht 66.0 in | Wt 185.0 lb

## 2024-11-01 DIAGNOSIS — J3089 Other allergic rhinitis: Secondary | ICD-10-CM

## 2024-11-01 DIAGNOSIS — J4550 Severe persistent asthma, uncomplicated: Secondary | ICD-10-CM | POA: Diagnosis not present

## 2024-11-01 DIAGNOSIS — J4541 Moderate persistent asthma with (acute) exacerbation: Secondary | ICD-10-CM | POA: Diagnosis not present

## 2024-11-01 DIAGNOSIS — T7800XD Anaphylactic reaction due to unspecified food, subsequent encounter: Secondary | ICD-10-CM

## 2024-11-01 DIAGNOSIS — J302 Other seasonal allergic rhinitis: Secondary | ICD-10-CM | POA: Diagnosis not present

## 2024-11-01 MED ORDER — EPINEPHRINE 0.3 MG/0.3ML IJ SOAJ
0.3000 mg | Freq: Once | INTRAMUSCULAR | 2 refills | Status: AC
  Filled 2024-11-01: qty 2, 2d supply, fill #0

## 2024-11-01 MED ORDER — NEFFY 2 MG/0.1ML NA SOLN
2.0000 mg | Freq: Two times a day (BID) | NASAL | 1 refills | Status: DC | PRN
  Filled 2024-11-01: qty 6, fill #0

## 2024-11-01 MED ORDER — NEFFY 2 MG/0.1ML NA SOLN: 2.0000 mg | Freq: Two times a day (BID) | NASAL | 1 refills | Status: DC | PRN

## 2024-11-01 MED ORDER — PANTOPRAZOLE SODIUM 40 MG PO TBEC
40.0000 mg | DELAYED_RELEASE_TABLET | Freq: Every day | ORAL | 3 refills | Status: AC
  Filled 2024-11-01 – 2024-12-10 (×3): qty 90, 90d supply, fill #0

## 2024-11-01 MED ORDER — TRELEGY ELLIPTA 200-62.5-25 MCG/ACT IN AEPB
1.0000 | INHALATION_SPRAY | Freq: Every day | RESPIRATORY_TRACT | 1 refills | Status: AC
  Filled 2024-11-01: qty 180, 90d supply, fill #0

## 2024-11-01 MED ORDER — TRIAMCINOLONE ACETONIDE 0.1 % EX OINT
TOPICAL_OINTMENT | Freq: Two times a day (BID) | CUTANEOUS | 2 refills | Status: AC
  Filled 2024-11-01: qty 30, 30d supply, fill #0

## 2024-11-01 MED ORDER — ALBUTEROL SULFATE HFA 108 (90 BASE) MCG/ACT IN AERS
1.0000 | INHALATION_SPRAY | Freq: Four times a day (QID) | RESPIRATORY_TRACT | 0 refills | Status: AC | PRN
  Filled 2024-11-01 – 2024-12-05 (×2): qty 20.1, 75d supply, fill #0

## 2024-11-01 MED ORDER — AZELASTINE-FLUTICASONE 137-50 MCG/ACT NA SUSP
2.0000 | Freq: Two times a day (BID) | NASAL | 1 refills | Status: AC | PRN
  Filled 2024-11-01: qty 23, 30d supply, fill #0

## 2024-11-01 MED ORDER — IPRATROPIUM BROMIDE 0.06 % NA SOLN
2.0000 | Freq: Four times a day (QID) | NASAL | 5 refills | Status: AC
  Filled 2024-11-01: qty 45, 57d supply, fill #0
  Filled 2024-12-05: qty 45, 57d supply, fill #1
  Filled ????-??-??: fill #1

## 2024-11-01 NOTE — Patient Instructions (Addendum)
 1. Moderate persistent asthma, uncomplicated - Lung testing looked AWESOME. - Tezspire  given today.  - Daily controller medication(s): Trelegy 200/62.5/25 one puff once daily and Tezspire  every four weeks. - Prior to physical activity: albuterol  2 puffs 10-15 minutes before physical activity. - Rescue medications: albuterol  4 puffs every 4-6 hours as needed - Changes during respiratory infections or worsening symptoms: ADD ON Arnuity to 1 puff twice daily for ONE TO TWO WEEKS. - Asthma control goals:  * Full participation in all desired activities (may need albuterol  before activity) * Albuterol  use two time or less a week on average (not counting use with activity) * Cough interfering with sleep two time or less a month * Oral steroids no more than once a year * No hospitalizations  2. Seasonal and perennial allergic rhinitis - Continue with cetirizine  20mg  twice daily. - Continue ipratropium bromide  nasal spray 2 sprays each nostril twice a day as needed. Do not use on the same day that you use Dymista  - Continue with Dymista  one sprays per nostril up to twice daily. - Continue with nasal saline rinses.   3. Anaphylactic shock due to food - Avoid shrimp. - EpiPen  is up to date.   4. Antibiotic allergies - Consider doing a Keflex  challenge - Consider doing a Bactrim challenge.  5. Recurrent infections - Repeat Streptococcal tiers to make sure that your immune system reacted well to the vaccine.   6. Return in about 6 months (around 05/01/2025). You can have the follow up appointment with Dr. Iva or a Nurse Practicioner (our Nurse Practitioners are excellent and always have Physician oversight!).    Please inform us  of any Emergency Department visits, hospitalizations, or changes in symptoms. Call us  before going to the ED for breathing or allergy symptoms since we might be able to fit you in for a sick visit. Feel free to contact us  anytime with any questions, problems,  or concerns.  It was a pleasure to see you again today!  Websites that have reliable patient information: 1. American Academy of Asthma, Allergy, and Immunology: www.aaaai.org 2. Food Allergy Research and Education (FARE): foodallergy.org 3. Mothers of Asthmatics: http://www.asthmacommunitynetwork.org 4. American College of Allergy, Asthma, and Immunology: www.acaai.org      "Like" us  on Facebook and Instagram for our latest updates!      A healthy democracy works best when [employer] participate! Make sure you are registered to vote! If you have moved or changed any of your contact information, you will need to get this updated before voting! Scan the QR codes below to learn more!

## 2024-11-01 NOTE — Progress Notes (Signed)
 FOLLOW UP  Date of Service/Encounter:  11/01/24   Assessment:   Moderate persistent asthma, uncomplicated   Seasonal and perennial allergic rhinitis (grass, trees, molds, cat, dust mite, cockroach) - s/p five years of allergen immunotherapy)   Anaphylactic shock due to food (shrimp) - tolerates other shellfish    Itching   S/p hysterectomy (June 2021)   S/p gastric bypass (2016)    Plan/Recommendations:   1. Moderate persistent asthma, uncomplicated - Lung testing looked AWESOME. - Tezspire  given today.  - Daily controller medication(s): Trelegy 200/62.5/25 one puff once daily and Tezspire  every four weeks. - Prior to physical activity: albuterol  2 puffs 10-15 minutes before physical activity. - Rescue medications: albuterol  4 puffs every 4-6 hours as needed - Changes during respiratory infections or worsening symptoms: ADD ON Arnuity to 1 puff twice daily for ONE TO TWO WEEKS. - Asthma control goals:  * Full participation in all desired activities (may need albuterol  before activity) * Albuterol  use two time or less a week on average (not counting use with activity) * Cough interfering with sleep two time or less a month * Oral steroids no more than once a year * No hospitalizations  2. Seasonal and perennial allergic rhinitis - Continue with cetirizine  20mg  twice daily. - Continue ipratropium bromide  nasal spray 2 sprays each nostril twice a day as needed. Do not use on the same day that you use Dymista  - Continue with Dymista  one sprays per nostril up to twice daily. - Continue with nasal saline rinses.   3. Anaphylactic shock due to food - Avoid shrimp. - EpiPen  is up to date.   4. Antibiotic allergies - Consider doing a Keflex  challenge - Consider doing a Bactrim challenge.  5. Recurrent infections - Repeat Streptococcal tiers to make sure that your immune system reacted well to the vaccine.   6. Return in about 6 months (around 05/01/2025). You can  have the follow up appointment with Dr. Iva or a Nurse Practicioner (our Nurse Practitioners are excellent and always have Physician oversight!).   Subjective:   Donna Park is a 53 y.o. female presenting today for follow up of  Chief Complaint  Patient presents with   Follow-up    Asthma  Allergies No concerns    Donna Park has a history of the following: Patient Active Problem List   Diagnosis Date Noted   Osteopenia 09/26/2024   Dysuria 08/14/2023   Dysphagia    LUQ pain 03/01/2022   Change in bowel habits 03/01/2022   Loss of weight 03/01/2022   Chronic migraine without aura without status migrainosus, not intractable 06/14/2021   S/P laparoscopic assisted vaginal hysterectomy (LAVH) 05/15/2020   Migraines    GERD (gastroesophageal reflux disease)    Asthma    Seasonal and perennial allergic rhinitis 02/10/2019   Moderate persistent asthma, uncomplicated 02/10/2019   Anaphylactic shock due to adverse food reaction 02/10/2019   Atopic dermatitis 02/10/2019   History of Roux-en-Y gastric bypass 2016    History obtained from: chart review and patient.  Discussed the use of AI scribe software for clinical note transcription with the patient and/or guardian, who gave verbal consent to proceed.  Donna Park is a 53 y.o. female presenting for a follow up visit. Seh was last seen in August 2025. At that time, we gave her a dose of Tezspire . We continued with the Trelegy one puff once daily as well as albuterol  as needed. For her rhinitis, we continued with cetirizine  20mg   BID as well as ipratropium as needed and Dymista  as needed. She continued to avoid shrimp due to her history of anaphylaxis. We talked about doing some antibiotic challenges to take these off of her list.   Since the last visit, she has done well.  Asthma/Respiratory Symptom History: She remains on the Trelegy. She had one episode one month ago where she woke up with SOB. She did use her inhaler and  did fine. She has not received her Tezspire  injection for asthma in the past two months due to forgetting her appointment and failing to reschedule. Approximately one month ago, she experienced an episode of shortness of breath upon waking. She continues to use Trelegy regularly and has no nighttime coughing. She is able to perform all desired activities without limitation.  Allergic Rhinitis Symptom History: She has rhinorrhea and has her nasla sprays. She has been a noncompliant patient with regards to her nasal sprays. Her environmental allergies cause constant nasal drainage, although she has not been using her nasal sprays. She previously received allergy shots before she moved here. She is allergic to everything except dogs and has a history of being allergic to cockroaches, as determined by previous testing.  Food Allergy Symptom History: She had an accidental ingestion. She felt that her mouth was itching. She was wheezing and had to use her inhaler. She did use 3 cetirizine . She describes a recent allergic reaction to shellfish at work after consuming fish fried in the same oil as shrimp. She experienced oral itching and wheezing but did not have an EpiPen  with her. She took three Zyrtec  tablets to manage the reaction. She typically takes two Zyrtec  daily but has been taking three recently. She has been cautious about her diet to avoid shellfish exposure.  Skin Symptom History: She did have some breakthrough events.   Otherwise, there have been no changes to her past medical history, surgical history, family history, or social history.    Review of systems otherwise negative other than that mentioned in the HPI.    Objective:   Blood pressure 110/72, pulse 90, temperature 98 F (36.7 C), height 5' 6 (1.676 m), weight 185 lb (83.9 kg), SpO2 96%. Body mass index is 29.86 kg/m.    Physical Exam Vitals reviewed.  Constitutional:      Appearance: She is well-developed.     Comments:  Talkative.   HENT:     Head: Normocephalic and atraumatic.     Right Ear: Tympanic membrane, ear canal and external ear normal. No drainage, swelling or tenderness. Tympanic membrane is not injected, scarred, erythematous, retracted or bulging.     Left Ear: Tympanic membrane, ear canal and external ear normal. No drainage, swelling or tenderness. Tympanic membrane is not injected, scarred, erythematous, retracted or bulging.     Nose: No nasal deformity, septal deviation, mucosal edema or rhinorrhea.     Right Turbinates: Enlarged, swollen and pale.     Left Turbinates: Enlarged, swollen and pale.     Right Sinus: No maxillary sinus tenderness or frontal sinus tenderness.     Left Sinus: No maxillary sinus tenderness or frontal sinus tenderness.     Comments: No polyps noted.     Mouth/Throat:     Mouth: Mucous membranes are not pale and not dry.     Pharynx: Uvula midline.  Eyes:     General: Lids are normal. No allergic shiner.       Right eye: No discharge.  Left eye: No discharge.     Conjunctiva/sclera: Conjunctivae normal.     Right eye: Right conjunctiva is not injected. No chemosis.    Left eye: Left conjunctiva is not injected. No chemosis.    Pupils: Pupils are equal, round, and reactive to light.  Cardiovascular:     Rate and Rhythm: Normal rate and regular rhythm.     Heart sounds: Normal heart sounds.  Pulmonary:     Effort: Pulmonary effort is normal. No tachypnea, accessory muscle usage or respiratory distress.     Breath sounds: Normal breath sounds. No wheezing, rhonchi or rales.  Chest:     Chest wall: No tenderness.  Abdominal:     Tenderness: There is no abdominal tenderness. There is no guarding or rebound.  Lymphadenopathy:     Head:     Right side of head: No submandibular, tonsillar or occipital adenopathy.     Left side of head: No submandibular, tonsillar or occipital adenopathy.     Cervical: No cervical adenopathy.  Skin:    Coloration: Skin is  not pale.     Findings: No abrasion, erythema, petechiae or rash. Rash is not papular, urticarial or vesicular.  Neurological:     Mental Status: She is alert.  Psychiatric:        Behavior: Behavior is cooperative.      Diagnostic studies:    Spirometry: results normal (FEV1: 2.14/86%, FVC: 2.66/85%, FEV1/FVC: 80%).    Spirometry consistent with normal pattern.    Allergy Studies: labs sent instead       Marty Shaggy, MD  [facility] 

## 2024-11-01 NOTE — Progress Notes (Signed)
 Specialty Pharmacy Refill Coordination Note  Donna Park is a 53 y.o. female assessed today regarding refills of clinic administered specialty medication(s) Tezepelumab -ekko (Tezspire )   Clinic requested Courier to Provider Office   Delivery date: 11/07/24   Verified address: [facility]  [address]  [place] [address]   Medication will be filled on: 11/04/24

## 2024-11-02 ENCOUNTER — Other Ambulatory Visit (HOSPITAL_COMMUNITY): Payer: Self-pay

## 2024-11-02 ENCOUNTER — Encounter (HOSPITAL_COMMUNITY): Payer: Self-pay

## 2024-11-02 ENCOUNTER — Ambulatory Visit: Payer: Self-pay | Admitting: Allergy & Immunology

## 2024-11-02 LAB — CBC WITH DIFFERENTIAL/PLATELET
Basophils Absolute: 0 x10E3/uL (ref 0.0–0.2)
Basos: 1 %
EOS (ABSOLUTE): 0 x10E3/uL (ref 0.0–0.4)
Eos: 1 %
Hematocrit: 41.1 % (ref 34.0–46.6)
Hemoglobin: 12.8 g/dL (ref 11.1–15.9)
Immature Grans (Abs): 0 x10E3/uL (ref 0.0–0.1)
Immature Granulocytes: 0 %
Lymphocytes Absolute: 1.6 x10E3/uL (ref 0.7–3.1)
Lymphs: 31 %
MCH: 29.3 pg (ref 26.6–33.0)
MCHC: 31.1 g/dL — ABNORMAL LOW (ref 31.5–35.7)
MCV: 94 fL (ref 79–97)
Monocytes Absolute: 0.5 x10E3/uL (ref 0.1–0.9)
Monocytes: 10 %
Neutrophils Absolute: 2.9 x10E3/uL (ref 1.4–7.0)
Neutrophils: 57 %
Platelets: 180 x10E3/uL (ref 150–450)
RBC: 4.37 x10E6/uL (ref 3.77–5.28)
RDW: 12.3 % (ref 11.7–15.4)
WBC: 5.1 x10E3/uL (ref 3.4–10.8)

## 2024-11-02 NOTE — Progress Notes (Deleted)
 11/02/24 ALL: Donna Park returns for Botox . Previous seen by Dr Ines. She continues Ajovy  monthly and topiramate  25mg  and amitriptyline  10mg  daily.    Rizatritpan Nurtec Meloxicam  ondansetron     Consent Form Botulism Toxin Injection For Chronic Migraine    Reviewed orally with patient, additionally signature is on file:  Botulism toxin has been approved by the Federal drug administration for treatment of chronic migraine. Botulism toxin does not cure chronic migraine and it may not be effective in some patients.  The administration of botulism toxin is accomplished by injecting a small amount of toxin into the muscles of the neck and head. Dosage must be titrated for each individual. Any benefits resulting from botulism toxin tend to wear off after 3 months with a repeat injection required if benefit is to be maintained. Injections are usually done every 3-4 months with maximum effect peak achieved by about 2 or 3 weeks. Botulism toxin is expensive and you should be sure of what costs you will incur resulting from the injection.  The side effects of botulism toxin use for chronic migraine may include:   -Transient, and usually mild, facial weakness with facial injections  -Transient, and usually mild, head or neck weakness with head/neck injections  -Reduction or loss of forehead facial animation due to forehead muscle weakness  -Eyelid drooping  -Dry eye  -Pain at the site of injection or bruising at the site of injection  -Double vision  -Potential unknown long term risks   Contraindications: You should not have Botox  if you are pregnant, nursing, allergic to albumin, have an infection, skin condition, or muscle weakness at the site of the injection, or have myasthenia gravis, Lambert-Eaton syndrome, or ALS.  It is also possible that as with any injection, there may be an allergic reaction or no effect from the medication. Reduced effectiveness after repeated injections is  sometimes seen and rarely infection at the injection site may occur. All care will be taken to prevent these side effects. If therapy is given over a long time, atrophy and wasting in the muscle injected may occur. Occasionally the patient's become refractory to treatment because they develop antibodies to the toxin. In this event, therapy needs to be modified.  I have read the above information and consent to the administration of botulism toxin.    BOTOX  PROCEDURE NOTE FOR MIGRAINE HEADACHE  Contraindications and precautions discussed with patient(above). Aseptic procedure was observed and patient tolerated procedure. Procedure performed by Greig Forbes, FNP-C.   The condition has existed for more than 6 months, and pt does not have a diagnosis of ALS, Myasthenia Gravis or Lambert-Eaton Syndrome.  Risks and benefits of injections discussed and pt agrees to proceed with the procedure.  Written consent obtained  These injections are medically necessary. Pt  receives good benefits from these injections. These injections do not cause sedations or hallucinations which the oral therapies may cause.   Description of procedure:  The patient was placed in a sitting position. The standard protocol was used for Botox  as follows, with 5 units of Botox  injected at each site:  -Procerus muscle, midline injection  -Corrugator muscle, bilateral injection  -Frontalis muscle, bilateral injection, with 2 sites each side, medial injection was performed in the upper one third of the frontalis muscle, in the region vertical from the medial inferior edge of the superior orbital rim. The lateral injection was again in the upper one third of the forehead vertically above the lateral limbus of the cornea, 1.5  cm lateral to the medial injection site.  -Temporalis muscle injection, 4 sites, bilaterally. The first injection was 3 cm above the tragus of the ear, second injection site was 1.5 cm to 3 cm up from the first  injection site in line with the tragus of the ear. The third injection site was 1.5-3 cm forward between the first 2 injection sites. The fourth injection site was 1.5 cm posterior to the second injection site. 5th site laterally in the temporalis  muscleat the level of the outer canthus.  -Occipitalis muscle injection, 3 sites, bilaterally. The first injection was done one half way between the occipital protuberance and the tip of the mastoid process behind the ear. The second injection site was done lateral and superior to the first, 1 fingerbreadth from the first injection. The third injection site was 1 fingerbreadth superiorly and medially from the first injection site.  -Cervical paraspinal muscle injection, 2 sites, bilaterally. The first injection site was 1 cm from the midline of the cervical spine, 3 cm inferior to the lower border of the occipital protuberance. The second injection site was 1.5 cm superiorly and laterally to the first injection site.  -Trapezius muscle injection was performed at 3 sites, bilaterally. The first injection site was in the upper trapezius muscle halfway between the inflection point of the neck, and the acromion. The second injection site was one half way between the acromion and the first injection site. The third injection was done between the first injection site and the inflection point of the neck.   Will return for repeat injection in 3 months.   A total of 200 units of Botox  was prepared, 155 units of Botox  was injected as documented above, any Botox  not injected was wasted. The patient tolerated the procedure well, there were no complications of the above procedure.

## 2024-11-05 LAB — ALLERGENS W/COMP RFLX AREA 2

## 2024-11-06 ENCOUNTER — Encounter: Payer: Self-pay | Admitting: Allergy & Immunology

## 2024-11-07 ENCOUNTER — Other Ambulatory Visit: Payer: Self-pay

## 2024-11-07 ENCOUNTER — Telehealth: Payer: Self-pay | Admitting: Allergy & Immunology

## 2024-11-07 ENCOUNTER — Other Ambulatory Visit (HOSPITAL_COMMUNITY): Payer: Self-pay

## 2024-11-07 NOTE — Telephone Encounter (Signed)
 [facility] called to ask about Rx instructions, as this medication is usually written for use when having an anaphylactic shock   They advised that Dr can resend Rx through with updated instructions

## 2024-11-08 ENCOUNTER — Ambulatory Visit: Admitting: Family Medicine

## 2024-11-08 ENCOUNTER — Encounter: Payer: Self-pay | Admitting: Family Medicine

## 2024-11-08 ENCOUNTER — Other Ambulatory Visit: Payer: Self-pay

## 2024-11-08 ENCOUNTER — Other Ambulatory Visit (HOSPITAL_COMMUNITY): Payer: Self-pay

## 2024-11-08 DIAGNOSIS — G43009 Migraine without aura, not intractable, without status migrainosus: Secondary | ICD-10-CM

## 2024-11-08 MED ORDER — NEFFY 2 MG/0.1ML NA SOLN
2.0000 mg | Freq: Every day | NASAL | 1 refills | Status: AC | PRN
  Filled 2024-11-08: qty 4, 30d supply, fill #0

## 2024-11-08 NOTE — Telephone Encounter (Signed)
 Yes that it the first thing that pops up. Sorry I changed it. Clearly working too quickly.   Marty Shaggy, MD [facility] 

## 2024-11-09 ENCOUNTER — Other Ambulatory Visit: Payer: Self-pay

## 2024-11-09 LAB — CAT DANDER (E1) IGE WITH REFLEX TO COMPONENT PANEL
E094-IgE Fel d 1: <0.1 kU/L
E220-IgE Fel d 2: <0.1 kU/L
E228-IgE Fel d 4: <0.1 kU/L
E231-IgE Fel d 7: <0.1 kU/L

## 2024-11-09 LAB — IGE DOG W/ COMPONENT REFLEX
Can f 4(e229) IgE: <0.1 kU/L
E101-IgE Can f 1: <0.1 kU/L
E102-IgE Can f 2: <0.1 kU/L
E221-IgE Can f 3: <0.1 kU/L
E226-IgE Can f 5: <0.1 kU/L
E230-IGE CAN F 6: <0.1 kU/L

## 2024-11-09 LAB — ALLERGENS W/COMP RFLX AREA 2
Can f 4(e229) IgE: <0.1 kU/L
Cedar, Mountain IgE: <0.1 kU/L
Cottonwood IgE: <0.1 kU/L
D Farinae IgE: <0.1 kU/L
D Pteronyssinus IgE: 0.12 kU/L — AB
E001-IgE Cat Dander: 8.76 kU/L — AB
E005-IgE Dog Dander: <0.1 kU/L
E094-IgE Fel d 1: 3.7 kU/L — AB
E102-IgE Can f 2: <0.1 kU/L
E220-IgE Fel d 2: 0.27 kU/L — AB
E221-IgE Can f 3: <0.1 kU/L
E230-IGE CAN F 6: <0.1 kU/L
IgE (Immunoglobulin E), Serum: 79 [IU]/mL (ref 6–495)
Johnson Grass IgE: 0.1 kU/L — AB
Johnson Grass IgE: 0.51 kU/L — AB
Maple/Box Elder IgE: <0.1 kU/L
Mouse Urine IgE: 0.11 kU/L — AB
Penicillium Chrysogen IgE: <0.1 kU/L
Penicillium Chrysogen IgE: <0.1 kU/L
Pigweed, Rough IgE: <0.1 kU/L
Ragweed, Short IgE: 0.11 kU/L — AB
Sheep Sorrel IgE Qn: <0.1 kU/L
Timothy Grass IgE: 0.44 kU/L — AB
Timothy Grass IgE: 0.6 kU/L — AB
White Mulberry IgE: <0.1 kU/L

## 2024-11-09 LAB — ALLERGEN PROFILE, SHELLFISH
Clam IgE: <0.1 kU/L
F023-IgE Crab: <0.1 kU/L
F080-IgE Lobster: <0.1 kU/L
F290-IgE Oyster: <0.1 kU/L
Scallop IgE: <0.1 kU/L
Shrimp IgE: 0.47 kU/L — AB

## 2024-11-09 LAB — ALLERGEN COMPONENT COMMENTS

## 2024-11-11 ENCOUNTER — Encounter: Payer: Self-pay | Admitting: Physical Therapy

## 2024-11-11 ENCOUNTER — Other Ambulatory Visit: Payer: Self-pay

## 2024-11-11 ENCOUNTER — Ambulatory Visit: Admitting: Physical Therapy

## 2024-11-11 DIAGNOSIS — M542 Cervicalgia: Secondary | ICD-10-CM | POA: Diagnosis not present

## 2024-11-11 DIAGNOSIS — M25551 Pain in right hip: Secondary | ICD-10-CM | POA: Diagnosis not present

## 2024-11-11 DIAGNOSIS — M5459 Other low back pain: Secondary | ICD-10-CM

## 2024-11-11 DIAGNOSIS — M25512 Pain in left shoulder: Secondary | ICD-10-CM | POA: Diagnosis not present

## 2024-11-11 DIAGNOSIS — G8929 Other chronic pain: Secondary | ICD-10-CM

## 2024-11-11 DIAGNOSIS — M6281 Muscle weakness (generalized): Secondary | ICD-10-CM | POA: Diagnosis not present

## 2024-11-11 NOTE — Therapy (Signed)
 OUTPATIENT PHYSICAL THERAPY TREATMENT   Patient Name: Donna Park MRN: [id number] DOB:11/27/1971, 53 y.o., female Today's Date: 11/11/2024   END OF SESSION:  PT End of Session - 11/11/24 0835     Visit Number 4    Number of Visits 9    Date for Recertification  12/02/24    Authorization Type [facility] [employer]    PT Start Time [phone]    PT Stop Time 0925    PT Time Calculation (min) 50 min    Activity Tolerance Patient tolerated treatment well    Behavior During Therapy [facility] for tasks assessed/performed             Past Medical History:  Diagnosis Date   Allergy    Anxiety    Arthritis    Asthma    Chicken pox    Complication of anesthesia    slow to wake up from anesthesia   Depression    DJD (degenerative joint disease)    GERD (gastroesophageal reflux disease)    resolved after gastric surgery   H/O gastric bypass 2016   History of iron deficiency anemia    HSV infection    Migraines    Obese    Pneumonia    Childhood history   PONV (postoperative nausea and vomiting)    Status post dilation of esophageal narrowing    Varicose vein of leg    Past Surgical History:  Procedure Laterality Date   ANKLE FRACTURE SURGERY Left 03/08/2009   BREAST SURGERY Bilateral    Cyst Removal   ESOPHAGEAL MANOMETRY N/A 09/24/2022   Procedure: ESOPHAGEAL MANOMETRY (EM);  Surgeon: San Sandor GAILS, DO;  Location: [facility];  Service: Gastroenterology;  Laterality: N/A;   GASTRIC BYPASS  06/18/2015   LAPAROSCOPIC VAGINAL HYSTERECTOMY WITH SALPINGECTOMY Bilateral 05/15/2020   Procedure: LAPAROSCOPIC ASSISTED VAGINAL HYSTERECTOMY WITH SALPINGECTOMY;  Surgeon: Marget Lenis, MD;  Location: [facility];  Service: Gynecology;  Laterality: Bilateral;  need bed   LASER ABLATION     Varicose veins   TONSILLECTOMY AND ADENOIDECTOMY  2001   TUBAL LIGATION  12/19/1999   UPPER GASTROINTESTINAL ENDOSCOPY     UPPER GI ENDOSCOPY     Uterine ablation  12/2018   Patient  Active Problem List   Diagnosis Date Noted   Osteopenia 09/26/2024   Dysuria 08/14/2023   Dysphagia    LUQ pain 03/01/2022   Change in bowel habits 03/01/2022   Loss of weight 03/01/2022   Chronic migraine without aura without status migrainosus, not intractable 06/14/2021   S/P laparoscopic assisted vaginal hysterectomy (LAVH) 05/15/2020   Migraines    GERD (gastroesophageal reflux disease)    Asthma    Seasonal and perennial allergic rhinitis 02/10/2019   Moderate persistent asthma, uncomplicated 02/10/2019   Anaphylactic shock due to adverse food reaction 02/10/2019   Atopic dermatitis 02/10/2019   History of Roux-en-Y gastric bypass 2016    PCP: Merna Huxley, NP  REFERRING PROVIDER: Joane Artist RAMAN, MD  REFERRING DIAG: Right hip pain; Neck pain  THERAPY DIAG:  Cervicalgia  Chronic left shoulder pain  Other low back pain  Pain in right hip  Muscle weakness (generalized)  Rationale for Evaluation and Treatment: Rehabilitation  ONSET DATE: Chronic   SUBJECTIVE:         SUBJECTIVE STATEMENT: Patient reports she has been doing her exercises and her neck is feeling a little better. She has been trying to more intentional with her posture at work.   Eval: Patient reports  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 She reports L > R neck and shoulder pain, feels like it won't let go. She also reports lower back pain and right hip pain. She states that some days she can't raise her arm due to the pain.   PERTINENT HISTORY:  See PMH above  PAIN:  Are you having pain? Yes:  NPRS scale: reports 6/10 Pain location: Neck and lower back Pain description: Tight Aggravating factors: Sitting at work, sleeping Relieving factors: Putting pressure over the left neck region  PRECAUTIONS: None  PATIENT GOALS: Pain relief   OBJECTIVE:  Note: Objective measures were completed at Evaluation unless otherwise  noted. PATIENT SURVEYS:  PSFS: 6 Reaching / cleaning: 4 Walking comfortably: 6 Walking upright: 8  POSTURE:   Rounded shoulder posture  PALPATION: Tender to palpation bilateral upper trap and periscapular region   CERVICAL ROM:   Active ROM A/PROM (deg) eval   10/21/2024  Flexion 50   Extension 35   Right lateral flexion    Left lateral flexion    Right rotation 65 70  Left rotation 55 65   (Blank rows = not tested)  UPPER EXTREMITY ROM:  Active ROM Right eval Left eval  Shoulder flexion 155 140  Shoulder extension    Shoulder abduction    Shoulder adduction    Shoulder extension    Shoulder internal rotation    Shoulder external rotation    Elbow flexion    Elbow extension    Wrist flexion    Wrist extension    Wrist ulnar deviation    Wrist radial deviation    Wrist pronation    Wrist supination     (Blank rows = not tested)  UPPER EXTREMITY MMT:  MMT Right eval Left eval  Shoulder flexion 5 4  Shoulder extension    Shoulder abduction    Shoulder adduction    Shoulder extension    Shoulder internal rotation    Shoulder external rotation    Middle trapezius    Lower trapezius    Elbow flexion    Elbow extension    Wrist flexion    Wrist extension    Wrist ulnar deviation    Wrist radial deviation    Wrist pronation    Wrist supination    Grip strength     (Blank rows = not tested)  LUMBAR ROM:   Active  A/PROM  eval  Flexion WFL  Extension 75%  Right lateral flexion 75%  Left lateral flexion 75%  Right rotation WFL  Left rotation 75%   (Blank rows = not tested)   FUNCTIONAL TESTS:  DNF endurance: 6 seconds   TREATMENT [facility] Adult PT Treatment:                                                DATE: 11/11/2024 STM / TPR for left upper trap, levator scap, rhomboid, cerivcal paraspinals Prone upper cervical PA mobs Prone CT junction PA mobs Prone scapular retraction 10 x 5 sec Supine suboccipital release with gentle manual  traction Supine cervical side glide mobs Supine passive upper trap and levator  Seated cervical retraction with finger assist at chin 10 x 3 sec  Trigger Point Dry Needling  Subsequent Treatment: Instructions provided previously at initial dry needling treatment.  Instructions reviewed, if requested by the patient, prior to subsequent dry needling treatment.   Patient Verbal Consent Given: Yes Education Handout Provided: Previously Provided Muscles Treated: Bilateral upper trap, levator scap, rhomboid, left splenius cervicis  Electrical Stimulation Performed: No Treatment Response/Outcome: Twitch response   PATIENT EDUCATION:  Education details: HEP update Person educated: Patient Education method: Explanation, Demonstration, Tactile cues, Verbal cues, and Handouts Education comprehension: verbalized understanding, returned demonstration, verbal cues required, tactile cues required, and needs further education  HOME EXERCISE PROGRAM: Access Code: [email or web address]    ASSESSMENT: CLINICAL IMPRESSION: Patient tolerated therapy well with no adverse effects. Continued with TPDN this visit for the bilateral cervical and periscapular region with good therapeutic benefit. Therapy focused on improving her cervical and thoracic mobility, and progressing postural control with good tolerance. She did report improvement in symptoms following therapy. Updated HEP to progress scapular and postural control with good tolerance. Patient would benefit from continued skilled PT to progress mobility and strength in order to reduce pain and maximize functional ability.   Eval: Patient is a 53 y.o. female who was seen today for physical therapy evaluation and treatment for chronic neck, shoulder, lower back, and right hip pain. Today's session focused primarily on neck and shoulder pain. She does exhibit limitations with her cervical and left shoulder motion, strength deficit of the left shoulder and DNF endurance  impairment. Performed TPDN this visit with good therapeutic benefit. Will further assess lower back and right hip at future session.    OBJECTIVE IMPAIRMENTS: decreased activity tolerance, decreased ROM, decreased strength, postural dysfunction, and pain.   ACTIVITY LIMITATIONS: carrying, lifting, sitting, sleeping, and reach over head  PARTICIPATION LIMITATIONS: meal prep, cleaning, community activity, and occupation  PERSONAL FACTORS: Fitness, Past/current experiences, and Time since onset of injury/illness/exacerbation are also affecting patient's functional outcome.    GOALS: Goals reviewed with patient? Yes  SHORT TERM GOALS: Target date: 11/04/2024  Patient will be I with initial HEP in order to progress with therapy. Baseline: HEP provided at eval 11/11/2024: independent with initial HEP Goal status: MET  2.  Patient will report neck and shoulder pain </= 5/10 in order to reduce functional limitations Baseline: reports 10/10 pain 11/11/2024: 6/10 Goal status: ONGOING  LONG TERM GOALS: Target date: 12/02/2024  Patient will be I with final HEP to maintain progress from PT. Baseline: HEP provided at eval Goal status: INITIAL  2.  Patient will report PSFS >/= 8 in order to indicate improvement in their functional ability. Baseline: 6 Goal status: INITIAL  3.  Patient will demonstrate cervical rotation >/= 70 deg bilaterally in order to reduce neck tightness and improve functional ability Baseline: see limitations above Goal status: INITIAL  4.  Patient will demonstrate DNF endurance >/= 30 sec to improve postural control and reduce pain with sitting Baseline: 6 seconds Goal status: INITIAL   PLAN: PT FREQUENCY: 1x/week  PT DURATION: 8 weeks  PLANNED INTERVENTIONS: 97164- PT Re-evaluation, 97750- Physical Performance Testing, 97110-Therapeutic exercises, 97530- Therapeutic activity, 97112- Neuromuscular re-education, 97535- Self Care, 02859- Manual therapy, 20560  (1-2 muscles), 20561 (3+ muscles)- Dry Needling, Patient/Family education, Joint mobilization, Joint manipulation, Spinal manipulation, Spinal mobilization, Cryotherapy, and Moist heat  PLAN FOR NEXT SESSION: Review HEP and progress PRN, manual/TPDN for cervical and lumbar region, postural control and DNF endurance, core and hip strengthening   Elaine Daring, PT, DPT, LAT, ATC 11/11/24  9:31 AM Phone: [phone] Fax: [phone]

## 2024-11-11 NOTE — Patient Instructions (Signed)
 Access Code: [email or web address] URL: [email or web address] Date: 11/11/2024 Prepared by: Elaine Daring  Exercises - Seated Passive Cervical Retraction  - 2-3 x daily - 10 reps - 3 seconds hold - Supine Shoulder Horizontal Abduction with Resistance  - 1 x daily - 3 sets - 5 reps - Sidelying Thoracic Lumbar Rotation  - 1 x daily - 2 sets - 5 reps - Clam  - 1 x daily - 3 sets - 10 reps - Prone Scapular Retraction  - 1 x daily - 2 sets - 10 reps - 5 seconds hold

## 2024-11-14 ENCOUNTER — Ambulatory Visit

## 2024-11-14 DIAGNOSIS — J4550 Severe persistent asthma, uncomplicated: Secondary | ICD-10-CM

## 2024-11-16 ENCOUNTER — Other Ambulatory Visit (HOSPITAL_COMMUNITY): Payer: Self-pay

## 2024-11-17 ENCOUNTER — Other Ambulatory Visit: Payer: Self-pay | Admitting: Allergy & Immunology

## 2024-11-17 ENCOUNTER — Other Ambulatory Visit: Payer: Self-pay | Admitting: Adult Health

## 2024-11-17 ENCOUNTER — Other Ambulatory Visit (HOSPITAL_COMMUNITY): Payer: Self-pay

## 2024-11-17 ENCOUNTER — Other Ambulatory Visit: Payer: Self-pay

## 2024-11-17 MED ORDER — FLUCONAZOLE 150 MG PO TABS
150.0000 mg | ORAL_TABLET | Freq: Every day | ORAL | 2 refills | Status: AC
  Filled 2024-11-17: qty 90, 90d supply, fill #0
  Filled 2024-12-05: qty 90, 90d supply, fill #1

## 2024-11-17 NOTE — Progress Notes (Unsigned)
 [facility] [address] [place] [address] Phone: [phone]   Assessment and Plan:     1. Neck pain (Primary) 2. Chronic bilateral thoracic back pain 3. Chronic bilateral low back pain without sciatica 4. Somatic dysfunction of cervical region 5. Somatic dysfunction of thoracic region 6. Somatic dysfunction of lumbar region 7. Somatic dysfunction of rib region 8. Somatic dysfunction of pelvic region  - Chronic with exacerbation, subsequent visit - Recurrence of multiple areas of musculoskeletal pain with primary pain in neck and bilateral hips - Patient has noticed improvement with OMT, HEP, physical therapy, meloxicam  as needed, dry needling, trigger point injections - Continue HEP and physical therapy and dry needling.  New PT referral for hips - Use meloxicam  15 mg daily as needed for breakthrough pain.  Recommend limiting chronic NSAIDs to 1-2 doses per week to prevent long-term side effects. Use Tylenol  500 to 1000 mg tablets 2-3 times a day as needed for day-to-day pain relief.    - Patient has received relief with OMT in the past.  Elects for repeat OMT today.  Tolerated well per note below. - Decision today to treat with OMT was based on Physical Exam   After verbal consent patient was treated with HVLA (high velocity low amplitude), ME (muscle energy), FPR (flex positional release), ST (soft tissue), PC/PD (Pelvic Compression/ Pelvic Decompression) techniques in cervical, rib, thoracic, lumbar, and pelvic areas. Patient tolerated the procedure well with improvement in symptoms.  Patient educated on potential side effects of soreness and recommended to rest, hydrate, and use Tylenol  as needed for pain control.   Pertinent previous records reviewed include none   Follow Up: 2 to 4 weeks for reevaluation.  Last Kenalog  trigger point injections performed on 08/26/2024, so would recommend waiting until 11/25/2024 or later before  repeating.     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 Has been trying to do some stretches.    10/16/2023 Patient states neck  and shoulder flare    11/13/2023 Patient states thoracic and cervical tightness/stiffness     11/26/2023 Patient states anterior shoulder/ clavicle pain. Decreased ROM. Ibu doesn't help . 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 Heat helps. Pain for about 2 weeks. Would like to know if she can get a refill of fosamax     01/29/2024 Patient states right shoulder is still in pain,  the pain is deep. Neck is alright for the moment but can feel it trying to pull    02/26/2024 Patient states neck is good , just here for an adjustment low back and hips are in pain    03/18/2024 Patient states right hip is in pain. So in turn left hip is in pain. Neck is alright. Shoulder is still flared   04/01/2024 Patient states injections for neck and down the bad.    04/15/2024 Patient states back is great. No pain in the neck. Just an adjustment today. Right hip is tight  and left ankle feels like its compacting and she can feel the metal.    05/13/2024 Patient states ready for an adjustment. Low back is really tight    05/27/2024 Patient states back and shoulders are tight    06/17/2024 Patient states low back and neck flare   07/15/2024 Patient states ready for adjustment    08/11/2024 Patient states low back and neck are flared. Whatever you did last time helped    08/26/2024 Patient states ready for injections. Back and neck  tightness    09/09/2024 Patient states neck tightness in one spot. Trap tightness just an adjustment    09/30/2024 Patient states neck and shoulders hurt today in the same area as usual.   10/28/2024 Patient states that she would like adjustment. Wants injection as next visit.   11/18/2024 Patient states would like to discuss lidocaine  and or kenalog  injections  Relevant Historical Information: Migraines, GERD, history of Roux-en-Y gastric bypass  Additional pertinent review of systems negative.  Current Outpatient Medications  Medication Sig Dispense Refill   acetaminophen  (TYLENOL ) 500 MG tablet Take by mouth.     acyclovir  ointment (ZOVIRAX ) 5 % Apply onto the affected area every 3 hours. 15 g 1   albuterol  (VENTOLIN  HFA) 108 (90 Base) MCG/ACT inhaler Inhale 1 - 2 puffs into the lungs every 6 hours as needed for wheezing or shortness of breath. 20.1 g 0   amitriptyline  (ELAVIL ) 10 MG tablet Take 1 tablet (10 mg total) by mouth at bedtime. 90 tablet 1   Azelastine -Fluticasone  (DYMISTA ) 137-50 MCG/ACT SUSP Place 2 sprays into both nostrils 2 (two) times daily as needed. 23 g 1   botulinum toxin Type A  (BOTOX ) 200 units injection Provider to inject 155 units into the muscles of the head and neck every 12 weeks. Discard remainder. 1 each 3   CALCIUM PO Take 1 tablet by mouth daily in the afternoon.     cetirizine  (ZYRTEC ) 10 MG tablet Take 2 tablets (20 mg total) by mouth 2 (two) times daily. 360 tablet 1   diazepam  (VALIUM ) 10 MG tablet Take 1 tablet (10 mg total) by mouth at bedtime as needed for sleep. 30 tablet 2   EPINEPHrine  (NEFFY ) 2 MG/0.1ML SOLN Place 2 mg into the nose daily as needed. 6 each 1   Estradiol  (ESTROGEL ) 0.75 MG/1.25 GM (0.06%) topical gel Apply 0.5 pumps every day by topical route. 37.5 g 4   fluconazole  (DIFLUCAN ) 150 MG tablet Take 1 tablet (150 mg total) by mouth daily. 90 tablet 2   Fluticasone -Umeclidin-Vilant (TRELEGY ELLIPTA ) 200-62.5-25 MCG/ACT AEPB  Inhale 1 puff into the lungs daily. 180 each 1   Fremanezumab -vfrm (AJOVY ) 225 MG/1.5ML SOAJ Inject 225 mg into the skin every 30 (thirty) days. 1.5 mL 11   Hypertonic Nasal Wash (SINUS RINSE REFILL) PACK Use one packet dissolved in water as needed. (Patient taking differently: Place 1 each into the nose in the morning and at bedtime.) 100 each 5   ibuprofen  (ADVIL ) 800 MG tablet Take 1 tablet (800 mg total) by mouth 3 (three) times daily as needed (pain.). 270 tablet 3   ipratropium (ATROVENT ) 0.06 % nasal spray Place 2 sprays into both nostrils 4 (four) times daily. 45 mL 5   linaclotide  (LINZESS ) 290 MCG CAPS capsule Take 1 capsule (290 mcg total) by mouth daily before breakfast. Patient needs follow up appointment for future refills. Please call [phone] to schedule an appointment. 90 capsule 4   meloxicam  (MOBIC ) 15 MG tablet Take 1  tablet (15 mg total) by mouth at bedtime as needed for pain. 90 tablet 3   nystatin  (MYCOSTATIN ) 100000 UNIT/ML suspension Take 5 mLs (500,000 Units total) by mouth 4 (four) times daily. Gargle and spit. 473 mL 0   ondansetron  (ZOFRAN -ODT) 4 MG disintegrating tablet Dissolve 1-2 tablets (4-8 mg total) by mouth every 8 (eight) hours as needed. May take with Rizatriptan  90 tablet 3   pantoprazole  (PROTONIX ) 40 MG tablet Take 1 tablet (40 mg total) by mouth at bedtime. 90 tablet 3   Pediatric Multiple Vit-C-FA (MULTIVITAMIN ANIMAL SHAPES, WITH CA/FA,) with C & FA chewable tablet Chew 2 tablets by mouth daily. Bariactric advatage     Rimegepant Sulfate  (NURTEC) 75 MG TBDP Take 1 tablet (75 mg total) by mouth daily as needed. 16 tablet 11   risedronate  (ACTONEL ) 150 MG tablet Take 1 tablet (150 mg total) by mouth every 30 (thirty) days. with water on empty stomach, nothing by mouth or lie down for next 30 minutes. 12 tablet 1   rizatriptan  (MAXALT -MLT) 10 MG disintegrating tablet Dissolve 1 tablet (10 mg total) by mouth as needed at onset of migraine. May take another  tablet 2 hours later if needed, max 2 tablets in 24 hours. 27 tablet 3   simethicone  (MYLICON) 80 MG chewable tablet Chew 80 mg by mouth every 6 (six) hours as needed for flatulence.     tezepelumab -ekko (TEZSPIRE ) 210 MG/1. syringe Inject 1.91 mLs (210 mg total) into the skin every 28 (twenty-eight) days. 1.91 mL 11   tizanidine  (ZANAFLEX ) 6 MG capsule Take 1 capsule (6 mg total) by mouth at bedtime. 90 capsule 3   topiramate  (TOPAMAX ) 25 MG tablet Take 1 tablet (25 mg total) by mouth at bedtime. 30 tablet 6   triamcinolone  ointment (KENALOG ) 0.1 % Apply topically 2 (two) times daily. 90 g 2   valACYclovir  (VALTREX ) 500 MG tablet Take 2 tablets (1,000 mg total) by mouth 2 (two) times daily. 360 tablet 0   Vibegron  (GEMTESA ) 75 MG TABS Take 1 tablet (75 mg total) by mouth daily. 90 tablet 4   Vibegron  (GEMTESA ) 75 MG TABS Take 1 tablet (75 mg total) by mouth daily for 90 days 90 tablet 4   Current Facility-Administered Medications  Medication Dose Route Frequency Provider Last Rate Last Admin   Fremanezumab -vfrm SOSY 225 mg  225 mg Subcutaneous Once        tezepelumab -ekko (TEZSPIRE ) 210 MG/1. syringe 210 mg  210 mg Subcutaneous Q28 days Iva Marty Saltness, MD   210 mg at 11/14/24 1323      Objective:     Vitals:   11/18/24 0947  Pulse: 69  SpO2: 99%  Weight: 185 lb (83.9 kg)  Height: 5' 6 (1.676 m)      Body mass index is 29.86 kg/m.    Physical Exam:     General: Well-appearing, cooperative, sitting comfortably in no acute distress.   OMT Physical Exam:  ASIS Compression Test: Positive Right Cervical: TTP paraspinal, C3-5 RL SL Rib: Bilateral elevated first rib with TTP Thoracic: TTP paraspinal, T2-4 RRSL, T5-7 RLSR Lumbar: TTP paraspinal, L1-3 RRSL L5, RL Pelvis: Right anterior innominate  Electronically signed by:  Odis Mace D.[name] Park [facility] 10:09 AM 11/18/2024

## 2024-11-18 ENCOUNTER — Ambulatory Visit: Admitting: Sports Medicine

## 2024-11-18 ENCOUNTER — Ambulatory Visit (INDEPENDENT_AMBULATORY_CARE_PROVIDER_SITE_OTHER): Admitting: Physical Therapy

## 2024-11-18 ENCOUNTER — Other Ambulatory Visit (HOSPITAL_COMMUNITY): Payer: Self-pay

## 2024-11-18 ENCOUNTER — Encounter: Payer: Self-pay | Admitting: Physical Therapy

## 2024-11-18 ENCOUNTER — Other Ambulatory Visit: Payer: Self-pay

## 2024-11-18 VITALS — HR 69 | Ht 66.0 in | Wt 185.0 lb

## 2024-11-18 DIAGNOSIS — M542 Cervicalgia: Secondary | ICD-10-CM | POA: Diagnosis not present

## 2024-11-18 DIAGNOSIS — M9901 Segmental and somatic dysfunction of cervical region: Secondary | ICD-10-CM | POA: Diagnosis not present

## 2024-11-18 DIAGNOSIS — M9905 Segmental and somatic dysfunction of pelvic region: Secondary | ICD-10-CM

## 2024-11-18 DIAGNOSIS — M9902 Segmental and somatic dysfunction of thoracic region: Secondary | ICD-10-CM | POA: Diagnosis not present

## 2024-11-18 DIAGNOSIS — M25551 Pain in right hip: Secondary | ICD-10-CM | POA: Diagnosis not present

## 2024-11-18 DIAGNOSIS — R102 Pelvic and perineal pain unspecified side: Secondary | ICD-10-CM | POA: Diagnosis not present

## 2024-11-18 DIAGNOSIS — M545 Low back pain, unspecified: Secondary | ICD-10-CM

## 2024-11-18 DIAGNOSIS — M25512 Pain in left shoulder: Secondary | ICD-10-CM

## 2024-11-18 DIAGNOSIS — M5459 Other low back pain: Secondary | ICD-10-CM | POA: Diagnosis not present

## 2024-11-18 DIAGNOSIS — M6281 Muscle weakness (generalized): Secondary | ICD-10-CM | POA: Diagnosis not present

## 2024-11-18 DIAGNOSIS — G8929 Other chronic pain: Secondary | ICD-10-CM | POA: Diagnosis not present

## 2024-11-18 DIAGNOSIS — M25559 Pain in unspecified hip: Secondary | ICD-10-CM | POA: Diagnosis not present

## 2024-11-18 DIAGNOSIS — M9908 Segmental and somatic dysfunction of rib cage: Secondary | ICD-10-CM | POA: Diagnosis not present

## 2024-11-18 DIAGNOSIS — M9903 Segmental and somatic dysfunction of lumbar region: Secondary | ICD-10-CM

## 2024-11-18 DIAGNOSIS — M546 Pain in thoracic spine: Secondary | ICD-10-CM

## 2024-11-18 DIAGNOSIS — N3281 Overactive bladder: Secondary | ICD-10-CM | POA: Diagnosis not present

## 2024-11-18 DIAGNOSIS — N941 Unspecified dyspareunia: Secondary | ICD-10-CM | POA: Diagnosis not present

## 2024-11-18 MED ORDER — GEMTESA 75 MG PO TABS: 75.0000 mg | ORAL_TABLET | Freq: Every day | ORAL | 4 refills | Status: AC

## 2024-11-18 NOTE — Therapy (Signed)
 OUTPATIENT PHYSICAL THERAPY TREATMENT   Patient Name: Donna Park MRN: [id number] DOB:03-06-1971, 53 y.o., female Today's Date: 11/18/2024   END OF SESSION:  PT End of Session - 11/18/24 1109     Visit Number 5    Number of Visits 9    Date for Recertification  12/02/24    Authorization Type [facility] [employer]    PT Start Time 1104    PT Stop Time 1145    PT Time Calculation (min) 41 min    Activity Tolerance Patient tolerated treatment well    Behavior During Therapy WFL for tasks assessed/performed              Past Medical History:  Diagnosis Date   Allergy    Anxiety    Arthritis    Asthma    Chicken pox    Complication of anesthesia    slow to wake up from anesthesia   Depression    DJD (degenerative joint disease)    GERD (gastroesophageal reflux disease)    resolved after gastric surgery   H/O gastric bypass 2016   History of iron deficiency anemia    HSV infection    Migraines    Obese    Pneumonia    Childhood history   PONV (postoperative nausea and vomiting)    Status post dilation of esophageal narrowing    Varicose vein of leg    Past Surgical History:  Procedure Laterality Date   ANKLE FRACTURE SURGERY Left 03/08/2009   BREAST SURGERY Bilateral    Cyst Removal   ESOPHAGEAL MANOMETRY N/A 09/24/2022   Procedure: ESOPHAGEAL MANOMETRY (EM);  Surgeon: San Sandor GAILS, DO;  Location: [facility];  Service: Gastroenterology;  Laterality: N/A;   GASTRIC BYPASS  06/18/2015   LAPAROSCOPIC VAGINAL HYSTERECTOMY WITH SALPINGECTOMY Bilateral 05/15/2020   Procedure: LAPAROSCOPIC ASSISTED VAGINAL HYSTERECTOMY WITH SALPINGECTOMY;  Surgeon: Marget Lenis, MD;  Location: [facility];  Service: Gynecology;  Laterality: Bilateral;  need bed   LASER ABLATION     Varicose veins   TONSILLECTOMY AND ADENOIDECTOMY  2001   TUBAL LIGATION  12/19/1999   UPPER GASTROINTESTINAL ENDOSCOPY     UPPER GI ENDOSCOPY     Uterine ablation  12/2018   Patient  Active Problem List   Diagnosis Date Noted   Osteopenia 09/26/2024   Dysuria 08/14/2023   Dysphagia    LUQ pain 03/01/2022   Change in bowel habits 03/01/2022   Loss of weight 03/01/2022   Chronic migraine without aura without status migrainosus, not intractable 06/14/2021   S/P laparoscopic assisted vaginal hysterectomy (LAVH) 05/15/2020   Migraines    GERD (gastroesophageal reflux disease)    Asthma    Seasonal and perennial allergic rhinitis 02/10/2019   Moderate persistent asthma, uncomplicated 02/10/2019   Anaphylactic shock due to adverse food reaction 02/10/2019   Atopic dermatitis 02/10/2019   History of Roux-en-Y gastric bypass 2016    PCP: Merna Huxley, NP  REFERRING PROVIDER: Joane Artist RAMAN, MD  REFERRING DIAG: Right hip pain; Neck pain  THERAPY DIAG:  Cervicalgia  Chronic left shoulder pain  Other low back pain  Pain in right hip  Muscle weakness (generalized)  Rationale for Evaluation and Treatment: Rehabilitation  ONSET DATE: Chronic   SUBJECTIVE:         SUBJECTIVE STATEMENT: Patient reports her neck and shoulder still hurt.   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 She  reports L > R neck and shoulder pain, feels like it won't let go. She also reports lower back pain and right hip pain. She states that some days she can't raise her arm due to the pain.   PERTINENT HISTORY:  See PMH above  PAIN:  Are you having pain? Yes:  NPRS scale: reports 6/10 Pain location: Neck and lower back Pain description: Tight Aggravating factors: Sitting at work, sleeping Relieving factors: Putting pressure over the left neck region  PRECAUTIONS: None  PATIENT GOALS: Pain relief   OBJECTIVE:  Note: Objective measures were completed at Evaluation unless otherwise noted. PATIENT SURVEYS:  PSFS: 6 Reaching / cleaning: 4 Walking comfortably: 6 Walking upright: 8  POSTURE:    Rounded shoulder posture  PALPATION: Tender to palpation bilateral upper trap and periscapular region   CERVICAL ROM:   Active ROM A/PROM (deg) eval   10/21/2024  Flexion 50   Extension 35   Right lateral flexion    Left lateral flexion    Right rotation 65 70  Left rotation 55 65   (Blank rows = not tested)  UPPER EXTREMITY ROM:  Active ROM Right eval Left eval  Shoulder flexion 155 140  Shoulder extension    Shoulder abduction    Shoulder adduction    Shoulder extension    Shoulder internal rotation    Shoulder external rotation    Elbow flexion    Elbow extension    Wrist flexion    Wrist extension    Wrist ulnar deviation    Wrist radial deviation    Wrist pronation    Wrist supination     (Blank rows = not tested)  UPPER EXTREMITY MMT:  MMT Right eval Left eval  Shoulder flexion 5 4  Shoulder extension    Shoulder abduction    Shoulder adduction    Shoulder extension    Shoulder internal rotation    Shoulder external rotation    Middle trapezius    Lower trapezius    Elbow flexion    Elbow extension    Wrist flexion    Wrist extension    Wrist ulnar deviation    Wrist radial deviation    Wrist pronation    Wrist supination    Grip strength     (Blank rows = not tested)  LUMBAR ROM:   Active  A/PROM  eval  Flexion WFL  Extension 75%  Right lateral flexion 75%  Left lateral flexion 75%  Right rotation WFL  Left rotation 75%   (Blank rows = not tested)   FUNCTIONAL TESTS:  DNF endurance: 6 seconds   TREATMENT [facility] Adult PT Treatment:                                                DATE: 11/18/2024 Supine suboccipital release with gentle manual traction Supine lateral cervical glides Supine passive upper trap and levator scap stretch Supine cervical retraction with peanut ball Prone upper cervical PA mobs Prone CT junction PA mobs Sidelying thoracic rotation x 5 each Cat cow x 5 Quadruped thoracic rotation full range x 5  each Seated cervical retraction with finger assist at chin 10 x 3 sec Seated horizontal abduction with yellow 2 x 10 Seated double ER and scap retraction with yellow 2 x 10   PATIENT EDUCATION:  Education details: HEP update Person educated:  Patient Education method: Explanation, Demonstration, Tactile cues, Verbal cues, and Handouts Education comprehension: verbalized understanding, returned demonstration, verbal cues required, tactile cues required, and needs further education  HOME EXERCISE PROGRAM: Access Code: [email or web address]    ASSESSMENT: CLINICAL IMPRESSION: Patient tolerated therapy well with no adverse effects. Therapy continued to focus on improving cervical mobility and reducing muscular tension, and progressing her postural control with good tolerance. Continued to perform manual therapy for the cervical spine with good therapeutic benefit and incorporated more spinal mobility exercises with patient reporting a good stretch along the scapular region. Progressed postural strengthening with seated banded periscapular exercises and updated her HEP to progress mobility and strengthening for home. Patient would benefit from continued skilled PT to progress mobility and strength in order to reduce pain and maximize functional ability.  Continued with TPDN this visit for the bilateral cervical and periscapular region with good therapeutic benefit. Therapy focused on improving her cervical and thoracic mobility, and progressing postural control with good tolerance. She did report improvement in symptoms following therapy. Updated HEP to progress scapular and postural control with good tolerance.    Eval: Patient is a 53 y.o. female who was seen today for physical therapy evaluation and treatment for chronic neck, shoulder, lower back, and right hip pain. Today's session focused primarily on neck and shoulder pain. She does exhibit limitations with her cervical and left shoulder motion, strength  deficit of the left shoulder and DNF endurance impairment. Performed TPDN this visit with good therapeutic benefit. Will further assess lower back and right hip at future session.    OBJECTIVE IMPAIRMENTS: decreased activity tolerance, decreased ROM, decreased strength, postural dysfunction, and pain.   ACTIVITY LIMITATIONS: carrying, lifting, sitting, sleeping, and reach over head  PARTICIPATION LIMITATIONS: meal prep, cleaning, community activity, and occupation  PERSONAL FACTORS: Fitness, Past/current experiences, and Time since onset of injury/illness/exacerbation are also affecting patient's functional outcome.    GOALS: Goals reviewed with patient? Yes  SHORT TERM GOALS: Target date: 11/04/2024  Patient will be I with initial HEP in order to progress with therapy. Baseline: HEP provided at eval 11/11/2024: independent with initial HEP Goal status: MET  2.  Patient will report neck and shoulder pain </= 5/10 in order to reduce functional limitations Baseline: reports 10/10 pain 11/11/2024: 6/10 Goal status: ONGOING  LONG TERM GOALS: Target date: 12/02/2024  Patient will be I with final HEP to maintain progress from PT. Baseline: HEP provided at eval Goal status: INITIAL  2.  Patient will report PSFS >/= 8 in order to indicate improvement in their functional ability. Baseline: 6 Goal status: INITIAL  3.  Patient will demonstrate cervical rotation >/= 70 deg bilaterally in order to reduce neck tightness and improve functional ability Baseline: see limitations above Goal status: INITIAL  4.  Patient will demonstrate DNF endurance >/= 30 sec to improve postural control and reduce pain with sitting Baseline: 6 seconds Goal status: INITIAL   PLAN: PT FREQUENCY: 1x/week  PT DURATION: 8 weeks  PLANNED INTERVENTIONS: 97164- PT Re-evaluation, 97750- Physical Performance Testing, 97110-Therapeutic exercises, 97530- Therapeutic activity, 97112- Neuromuscular re-education,  97535- Self Care, 02859- Manual therapy, 20560 (1-2 muscles), 20561 (3+ muscles)- Dry Needling, Patient/Family education, Joint mobilization, Joint manipulation, Spinal manipulation, Spinal mobilization, Cryotherapy, and Moist heat  PLAN FOR NEXT SESSION: Review HEP and progress PRN, manual/TPDN for cervical and lumbar region, postural control and DNF endurance, core and hip strengthening   Elaine Daring, PT, DPT, LAT, ATC 11/18/2024  12:00 PM Phone: [phone] Fax: [phone]

## 2024-11-18 NOTE — Patient Instructions (Signed)
 Access Code: [email or web address] URL: [email or web address] Date: 11/18/2024 Prepared by: Elaine Daring  Exercises - Seated Passive Cervical Retraction  - 2-3 x daily - 10 reps - 3 seconds hold - Sidelying Thoracic Lumbar Rotation  - 1 x daily - 2 sets - 5 reps - Quadruped Full Range Thoracic Rotation with Reach  - 1 x daily - 2 sets - 5 reps - Seated Shoulder Horizontal Abduction with Resistance - Palms Down  - 1 x daily - 2 sets - 10 reps - Shoulder External Rotation and Scapular Retraction with Resistance  - 1 x daily - 2 sets - 10 reps - Clam  - 1 x daily - 3 sets - 10 reps

## 2024-11-18 NOTE — Patient Instructions (Signed)
PT referral  4 week follow up

## 2024-11-22 ENCOUNTER — Telehealth: Payer: Self-pay | Admitting: Allergy & Immunology

## 2024-11-22 DIAGNOSIS — J302 Other seasonal allergic rhinitis: Secondary | ICD-10-CM

## 2024-11-22 NOTE — Telephone Encounter (Signed)
 Discussed lab results with the patient.  She is interested in pursuing RUSH immunotherapy to get back on her allergy shots.  Prescription written.  She is willing to go to the [place] on February 13 for Rush immunotherapy.  Marty Shaggy, MD [facility] 

## 2024-11-23 NOTE — Progress Notes (Signed)
 Aeroallergen Immunotherapy  Ordering Provider: Marty Shaggy, MD  Patient Details Name: Donna Park MRN: [id number] Date of Birth: 05-23-1971  Order 1 of 1  Vial Label: G/W/T/C/D/DM  0.3 ml (Volume)  BAU Concentration -- 7 Grass Mix* 100,000 ([place]  Blue, Union, Orchard, Perennial Rye, RedTop, Sweet Vernal, Timothy) 0.3 ml (Volume)  BAU Concentration -- Bermuda 10,000 0.2 ml (Volume)  1:20 Concentration -- Johnson 0.6 ml (Volume)  1:20 Concentration -- Ragweed Mix 0.5 ml (Volume)  1:20 Concentration -- Eastern 10 Tree Mix* 0.2 ml (Volume)  1:20 Concentration -- Maple Mix* 0.3 ml (Volume)  1:10 Concentration -- Oak, Eastern mix* 0.8 ml (Volume)   AU Concentration -- Mite Mix (DF 5,000 & DP 5,000) 0.8 ml (Volume)  1:10 Concentration -- Cat Hair 0.8 ml (Volume)  1:10 Concentration -- Dog Epithelia   4.8  ml Extract Subtotal 0.2  ml Diluent  5.0  ml Maintenance Total  Schedule:  B  Silver Vial (1:10,000): RUSH Green Vial (1:1,000): RUSH Blue Vial (1:100): RUSH Yellow Vial (1:10): Schedule B (6 doses) Red Vial (1:1): Schedule A (14 doses)  Special Instructions: After completion of the first Red Vial, please space to every two weeks. After completion of the second Red Vial, please space to every 4 weeks. Ok to up dose new vials on Schedule D. Ok to come twice weekly, if desired, as long as there is 48 hours between injections.

## 2024-11-23 NOTE — Progress Notes (Signed)
 VIALS WILL BE MADE WHEN PT IS SCHEDULED

## 2024-11-24 ENCOUNTER — Ambulatory Visit: Admitting: Allergy & Immunology

## 2024-11-25 ENCOUNTER — Other Ambulatory Visit (HOSPITAL_COMMUNITY): Payer: Self-pay

## 2024-11-28 ENCOUNTER — Telehealth: Payer: Self-pay | Admitting: Allergy & Immunology

## 2024-11-28 MED ORDER — PREDNISONE 10 MG PO TABS: ORAL_TABLET | ORAL | 0 refills | Status: DC

## 2024-11-28 NOTE — Telephone Encounter (Signed)
 Patient called with complaints of an asthma exacerbation while shei s on vacation in [place]. I sent in a prednisone  taper.   Marty Shaggy, MD [facility] 

## 2024-12-05 ENCOUNTER — Other Ambulatory Visit: Payer: Self-pay

## 2024-12-05 ENCOUNTER — Other Ambulatory Visit: Payer: Self-pay | Admitting: Adult Health

## 2024-12-06 ENCOUNTER — Encounter: Payer: Self-pay | Admitting: Adult Health

## 2024-12-06 ENCOUNTER — Other Ambulatory Visit (HOSPITAL_COMMUNITY): Payer: Self-pay

## 2024-12-06 DIAGNOSIS — G43919 Migraine, unspecified, intractable, without status migrainosus: Secondary | ICD-10-CM

## 2024-12-06 MED ORDER — DIAZEPAM 10 MG PO TABS
10.0000 mg | ORAL_TABLET | Freq: Every evening | ORAL | 2 refills | Status: AC | PRN
  Filled 2024-12-06 – 2024-12-15 (×3): qty 30, 30d supply, fill #0
  Filled 2024-12-30: qty 30, 30d supply, fill #1

## 2024-12-06 MED ORDER — VALACYCLOVIR HCL 500 MG PO TABS
1000.0000 mg | ORAL_TABLET | Freq: Two times a day (BID) | ORAL | 1 refills | Status: AC
  Filled 2024-12-06: qty 360, 90d supply, fill #0

## 2024-12-07 ENCOUNTER — Other Ambulatory Visit (HOSPITAL_COMMUNITY): Payer: Self-pay

## 2024-12-07 ENCOUNTER — Other Ambulatory Visit: Payer: Self-pay

## 2024-12-07 NOTE — Progress Notes (Signed)
 Specialty Pharmacy Refill Coordination Note  Clear Bag Patient  Donna Park is a 53 y.o. female contacted today regarding refills of specialty medication(s) Tezepelumab -ekko (Tezspire )  Injection appointment: 12/16/24  Patient requested: Courier to Provider Office   Delivery date: 12/12/24   Verified address: [facility] [address] [place] [address]  Medication will be filled on 12/09/24 .

## 2024-12-09 ENCOUNTER — Encounter: Payer: Self-pay | Admitting: Pharmacist

## 2024-12-09 ENCOUNTER — Other Ambulatory Visit: Payer: Self-pay

## 2024-12-09 ENCOUNTER — Other Ambulatory Visit (HOSPITAL_COMMUNITY): Payer: Self-pay

## 2024-12-10 ENCOUNTER — Other Ambulatory Visit (HOSPITAL_COMMUNITY): Payer: Self-pay

## 2024-12-12 ENCOUNTER — Other Ambulatory Visit (HOSPITAL_COMMUNITY): Payer: Self-pay

## 2024-12-12 ENCOUNTER — Other Ambulatory Visit: Payer: Self-pay

## 2024-12-13 MED ORDER — AZITHROMYCIN 250 MG PO TABS: ORAL_TABLET | ORAL | 0 refills | Status: DC

## 2024-12-13 MED ORDER — AZITHROMYCIN 250 MG PO TABS
ORAL_TABLET | ORAL | 0 refills | Status: DC
  Filled 2024-12-13: qty 6, 5d supply, fill #0

## 2024-12-13 NOTE — Addendum Note (Signed)
 Addended by: IVA MARTY SALTNESS on: 12/13/2024 01:09 PM   Modules accepted: Orders

## 2024-12-13 NOTE — Telephone Encounter (Signed)
 Patient is still experiencing mucous production and wheezing. I am going to send in azithromycin  to treat any secondary bacterial illness and provide some more anti-inflammatory effects.   Marty Shaggy, MD [facility] 

## 2024-12-14 ENCOUNTER — Other Ambulatory Visit (HOSPITAL_COMMUNITY): Payer: Self-pay

## 2024-12-14 ENCOUNTER — Other Ambulatory Visit: Payer: Self-pay

## 2024-12-15 ENCOUNTER — Other Ambulatory Visit (HOSPITAL_COMMUNITY): Payer: Self-pay

## 2024-12-15 ENCOUNTER — Other Ambulatory Visit: Payer: Self-pay

## 2024-12-15 NOTE — Progress Notes (Unsigned)
 "  [facility] [address] [place] [address] Phone: [phone]   Assessment and Plan:     *** - Patient has received relief with OMT in the past.  Elects for repeat OMT today.  Tolerated well per note below. - Decision today to treat with OMT was based on Physical Exam   After verbal consent patient was treated with HVLA (high velocity low amplitude), ME (muscle energy), FPR (flex positional release), ST (soft tissue), PC/PD (Pelvic Compression/ Pelvic Decompression) techniques in cervical, rib, thoracic, lumbar, and pelvic areas. Patient tolerated the procedure well with improvement in symptoms.  Patient educated on potential side effects of soreness and recommended to rest, hydrate, and use Tylenol  as needed for pain control.   Pertinent previous records reviewed include ***    Follow Up: ***     Subjective:   I, Stacia Feazell, am serving as a neurosurgeon for [employer]   Chief Complaint: neck pain    HPI:    08/07/2023 Patient states her low back and neck are flared and here for adjustment    09/04/2023 Patient states that her upper trap is tight , right shoulder tightness and pain, low back flare of tightness she isnt able to stand up straight    10/09/2023 Patient states that she is tight. Has been trying to do some stretches.    10/16/2023 Patient states neck  and shoulder flare    11/13/2023 Patient states thoracic and cervical tightness/stiffness     11/26/2023 Patient states anterior shoulder/ clavicle pain. Decreased ROM. Ibu doesn't help . 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 Heat helps. Pain for about 2 weeks. Would like to know if she can get a refill of fosamax     01/29/2024 Patient states right shoulder is still in pain,  the pain is deep. Neck is alright for the moment but can feel it trying to pull    02/26/2024 Patient states  neck is good , just here for an adjustment low back and hips are in pain    03/18/2024 Patient states right hip is in pain. So in turn left hip is in pain. Neck is alright. Shoulder is still flared   04/01/2024 Patient states injections for neck and down the bad.    04/15/2024 Patient states back is great. No pain in the neck. Just an adjustment today. Right hip is tight and left ankle feels like its compacting and she can feel the metal.    05/13/2024 Patient states ready for an adjustment. Low back is really tight    05/27/2024 Patient states back and shoulders are tight    06/17/2024 Patient states low back and neck flare   07/15/2024 Patient states ready for adjustment    08/11/2024 Patient states low back and neck are flared. Whatever you did last time helped    08/26/2024 Patient states ready for injections. Back and neck tightness    09/09/2024 Patient states neck tightness in one spot. Trap tightness just an adjustment    09/30/2024 Patient states neck and shoulders hurt today in the same area as usual.   10/28/2024 Patient states that she would like adjustment. Wants injection as next visit.    11/18/2024 Patient states would like to discuss lidocaine  and or kenalog  injections  12/16/2024 Patient states   Relevant Historical Information: Migraines, GERD, history of Roux-en-Y gastric bypass  Additional pertinent  review of systems negative.  Current Outpatient Medications  Medication Sig Dispense Refill   acetaminophen  (TYLENOL ) 500 MG tablet Take by mouth.     acyclovir  ointment (ZOVIRAX ) 5 % Apply onto the affected area every 3 hours. 15 g 1   albuterol  (VENTOLIN  HFA) 108 (90 Base) MCG/ACT inhaler Inhale 1 - 2 puffs into the lungs every 6 hours as needed for wheezing or shortness of breath. 20.1 g 0   amitriptyline  (ELAVIL ) 10 MG tablet Take 1 tablet (10 mg total) by mouth at bedtime. 90 tablet 1   Azelastine -Fluticasone  (DYMISTA ) 137-50 MCG/ACT SUSP Place 2 sprays into  both nostrils 2 (two) times daily as needed. 23 g 1   azithromycin  (ZITHROMAX ) 250 MG tablet Take two tablets on the first day and then one tablet daily for four more days. 6 each 0   botulinum toxin Type A  (BOTOX ) 200 units injection Provider to inject 155 units into the muscles of the head and neck every 12 weeks. Discard remainder. 1 each 3   CALCIUM PO Take 1 tablet by mouth daily in the afternoon.     cetirizine  (ZYRTEC ) 10 MG tablet Take 2 tablets (20 mg total) by mouth 2 (two) times daily. 360 tablet 1   diazepam  (VALIUM ) 10 MG tablet Take 1 tablet (10 mg total) by mouth at bedtime as needed for sleep. 30 tablet 2   EPINEPHrine  (NEFFY ) 2 MG/0.1ML SOLN Place 2 mg into the nose daily as needed. 6 each 1   Estradiol  (ESTROGEL ) 0.75 MG/1.25 GM (0.06%) topical gel Apply 0.5 pumps every day by topical route. 37.5 g 4   fluconazole  (DIFLUCAN ) 150 MG tablet Take 1 tablet (150 mg total) by mouth daily. 90 tablet 2   Fluticasone -Umeclidin-Vilant (TRELEGY ELLIPTA ) 200-62.5-25 MCG/ACT AEPB Inhale 1 puff into the lungs daily. 180 each 1   Fremanezumab -vfrm (AJOVY ) 225 MG/1.5ML SOAJ Inject 225 mg into the skin every 30 (thirty) days. 1.5 mL 11   Hypertonic Nasal Wash (SINUS RINSE REFILL) PACK Use one packet dissolved in water as needed. (Patient taking differently: Place 1 each into the nose in the morning and at bedtime.) 100 each 5   ibuprofen  (ADVIL ) 800 MG tablet Take 1 tablet (800 mg total) by mouth 3 (three) times daily as needed (pain.). 270 tablet 3   ipratropium (ATROVENT ) 0.06 % nasal spray Place 2 sprays into both nostrils 4 (four) times daily. 45 mL 5   linaclotide  (LINZESS ) 290 MCG CAPS capsule Take 1 capsule (290 mcg total) by mouth daily before breakfast. Patient needs follow up appointment for future refills. Please call [phone] to schedule an appointment. 90 capsule 4   meloxicam  (MOBIC ) 15 MG tablet Take 1 tablet (15 mg total) by mouth at bedtime as needed for pain. 90 tablet 3    nystatin  (MYCOSTATIN ) 100000 UNIT/ML suspension Take 5 mLs (500,000 Units total) by mouth 4 (four) times daily. Gargle and spit. 473 mL 0   ondansetron  (ZOFRAN -ODT) 4 MG disintegrating tablet Dissolve 1-2 tablets (4-8 mg total) by mouth every 8 (eight) hours as needed. May take with Rizatriptan  90 tablet 3   pantoprazole  (PROTONIX ) 40 MG tablet Take 1 tablet (40 mg total) by mouth at bedtime. 90 tablet 3   Pediatric Multiple Vit-C-FA (MULTIVITAMIN ANIMAL SHAPES, WITH CA/FA,) with C & FA chewable tablet Chew 2 tablets by mouth daily. Bariactric advatage     predniSONE  (DELTASONE ) 10 MG tablet Take 3 tabs (30mg ) twice daily for 3 days, then 2 tabs (20mg ) twice daily for 3  days, then 1 tab (10mg ) twice daily for 3 days, then STOP. 36 tablet 0   Rimegepant Sulfate  (NURTEC) 75 MG TBDP Take 1 tablet (75 mg total) by mouth daily as needed. 16 tablet 11   risedronate  (ACTONEL ) 150 MG tablet Take 1 tablet (150 mg total) by mouth every 30 (thirty) days. with water on empty stomach, nothing by mouth or lie down for next 30 minutes. 12 tablet 1   rizatriptan  (MAXALT -MLT) 10 MG disintegrating tablet Dissolve 1 tablet (10 mg total) by mouth as needed at onset of migraine. May take another tablet 2 hours later if needed, max 2 tablets in 24 hours. 27 tablet 3   simethicone  (MYLICON) 80 MG chewable tablet Chew 80 mg by mouth every 6 (six) hours as needed for flatulence.     tezepelumab -ekko (TEZSPIRE ) 210 MG/1. syringe Inject 1.91 mLs (210 mg total) into the skin every 28 (twenty-eight) days. 1.91 mL 11   tizanidine  (ZANAFLEX ) 6 MG capsule Take 1 capsule (6 mg total) by mouth at bedtime. 90 capsule 3   topiramate  (TOPAMAX ) 25 MG tablet Take 1 tablet (25 mg total) by mouth at bedtime. 30 tablet 6   triamcinolone  ointment (KENALOG ) 0.1 % Apply topically 2 (two) times daily. 90 g 2   valACYclovir  (VALTREX ) 500 MG tablet Take 2 tablets (1,000 mg total) by mouth 2 (two) times daily. 360 tablet 1   Vibegron  (GEMTESA )  75 MG TABS Take 1 tablet (75 mg total) by mouth daily. 90 tablet 4   Vibegron  (GEMTESA ) 75 MG TABS Take 1 tablet (75 mg total) by mouth daily for 90 days 90 tablet 4   Current Facility-Administered Medications  Medication Dose Route Frequency Provider Last Rate Last Admin   Fremanezumab -vfrm SOSY 225 mg  225 mg Subcutaneous Once        tezepelumab -ekko (TEZSPIRE ) 210 MG/1. syringe 210 mg  210 mg Subcutaneous Q28 days Iva Marty Saltness, MD   210 mg at 11/14/24 1323      Objective:     There were no vitals filed for this visit.    There is no height or weight on file to calculate BMI.    Physical Exam:     General: Well-appearing, cooperative, sitting comfortably in no acute distress.   OMT Physical Exam:  ASIS Compression Test: Positive Right Cervical: TTP paraspinal, *** Rib: Bilateral elevated first rib with TTP Thoracic: TTP paraspinal,*** Lumbar: TTP paraspinal,*** Pelvis: Right anterior innominate  Electronically signed by:  [facility] 7:21 AM 12/15/2024 "

## 2024-12-16 ENCOUNTER — Ambulatory Visit: Admitting: Sports Medicine

## 2024-12-16 ENCOUNTER — Other Ambulatory Visit: Payer: Self-pay

## 2024-12-16 ENCOUNTER — Other Ambulatory Visit (HOSPITAL_COMMUNITY): Payer: Self-pay

## 2024-12-16 ENCOUNTER — Ambulatory Visit

## 2024-12-16 ENCOUNTER — Encounter: Admitting: Physical Therapy

## 2024-12-16 DIAGNOSIS — J4550 Severe persistent asthma, uncomplicated: Secondary | ICD-10-CM

## 2024-12-16 DIAGNOSIS — B999 Unspecified infectious disease: Secondary | ICD-10-CM

## 2024-12-16 MED ORDER — AZITHROMYCIN 250 MG PO TABS
ORAL_TABLET | ORAL | 0 refills | Status: AC
  Filled 2024-12-16: qty 6, 5d supply, fill #0

## 2024-12-16 MED ORDER — PREDNISONE 10 MG PO TABS
ORAL_TABLET | ORAL | 0 refills | Status: AC
  Filled 2024-12-16: qty 36, 9d supply, fill #0

## 2024-12-16 NOTE — Telephone Encounter (Signed)
 Please advise

## 2024-12-20 NOTE — Addendum Note (Signed)
 Addended by: VICCI LEADER R on: 12/20/2024 03:28 PM   Modules accepted: Orders

## 2024-12-22 ENCOUNTER — Telehealth: Payer: Self-pay | Admitting: Neurology

## 2024-12-22 NOTE — Telephone Encounter (Signed)
 Discussed with billing manager Angie. She states we can release the Botox  to the patient, but she will need to sign a medication release form. I LVM and sent MyChart msg to the patient asking her to contact me.

## 2024-12-22 NOTE — Telephone Encounter (Signed)
 Pt states she has Botox  here that she has paid for, she would like to know if she can come here for it or if the pharmacy needs to make arrangements to come and pick it up and have it taken to the provider pt has in mind to do her Botox  going forward(Dr Ines).Phone rep reached out to Botox  coordinator and was advised to relay to pt : she will discuss w management and will reach out to her with an answer.

## 2024-12-23 ENCOUNTER — Encounter: Admitting: Physical Therapy

## 2024-12-26 NOTE — Telephone Encounter (Signed)
 I left another VM asking patient to call me back to discuss.

## 2024-12-27 NOTE — Telephone Encounter (Signed)
 Pt left me a voicemail stating she will come Friday to pick up the Botox . I placed medication release form at the front desk for her to sign when she comes.

## 2024-12-28 ENCOUNTER — Other Ambulatory Visit: Payer: Self-pay

## 2024-12-28 NOTE — Progress Notes (Signed)
 Specialty Pharmacy Refill Coordination Note  Donna Park is a 54 y.o. female contacted today regarding refills of specialty medication(s) Tezepelumab -ekko (Tezspire )   Patient requested Courier to Provider Office   Delivery date: 01/09/25   Verified address: [facility] [address] [place] [address]   Medication will be filled on: 01/06/25

## 2024-12-30 ENCOUNTER — Encounter: Payer: Self-pay | Admitting: Physical Therapy

## 2024-12-30 ENCOUNTER — Other Ambulatory Visit (HOSPITAL_COMMUNITY): Payer: Self-pay

## 2024-12-30 ENCOUNTER — Ambulatory Visit: Admitting: Sports Medicine

## 2024-12-30 ENCOUNTER — Other Ambulatory Visit: Payer: Self-pay

## 2024-12-30 ENCOUNTER — Ambulatory Visit (INDEPENDENT_AMBULATORY_CARE_PROVIDER_SITE_OTHER): Admitting: Physical Therapy

## 2024-12-30 VITALS — Ht 66.0 in | Wt 185.0 lb

## 2024-12-30 DIAGNOSIS — M546 Pain in thoracic spine: Secondary | ICD-10-CM

## 2024-12-30 DIAGNOSIS — M25551 Pain in right hip: Secondary | ICD-10-CM

## 2024-12-30 DIAGNOSIS — M6281 Muscle weakness (generalized): Secondary | ICD-10-CM | POA: Diagnosis not present

## 2024-12-30 DIAGNOSIS — G8929 Other chronic pain: Secondary | ICD-10-CM

## 2024-12-30 DIAGNOSIS — S46812D Strain of other muscles, fascia and tendons at shoulder and upper arm level, left arm, subsequent encounter: Secondary | ICD-10-CM

## 2024-12-30 DIAGNOSIS — M5459 Other low back pain: Secondary | ICD-10-CM

## 2024-12-30 DIAGNOSIS — M25512 Pain in left shoulder: Secondary | ICD-10-CM

## 2024-12-30 DIAGNOSIS — S46811D Strain of other muscles, fascia and tendons at shoulder and upper arm level, right arm, subsequent encounter: Secondary | ICD-10-CM

## 2024-12-30 DIAGNOSIS — M542 Cervicalgia: Secondary | ICD-10-CM

## 2024-12-30 MED ORDER — MELOXICAM 15 MG PO TABS
15.0000 mg | ORAL_TABLET | Freq: Every evening | ORAL | 3 refills | Status: AC | PRN
  Filled 2024-12-30: qty 90, 90d supply, fill #0

## 2024-12-30 NOTE — Patient Instructions (Signed)
-   Use meloxicam  15 mg daily as needed for breakthrough pain.  Recommend limiting chronic NSAIDs to 1-2 doses per week to prevent long-term side effects. Use Tylenol  500 to 1000 mg tablets 2-3 times a day as needed for day-to-day pain relief.    Refill meloxicam    Recommend heat and stretching after   Keep follow up

## 2024-12-30 NOTE — Therapy (Signed)
 " OUTPATIENT PHYSICAL THERAPY TREATMENT   Patient Name: Donna Park MRN: [id number] DOB:06-10-1971, 54 y.o., female Today's Date: 12/30/2024   END OF SESSION:  PT End of Session - 12/30/24 1016     Visit Number 6    Number of Visits 12    Date for Recertification  02/10/25    Authorization Type [facility] [employer]    PT Start Time 1016    PT Stop Time 1100    PT Time Calculation (min) 44 min    Activity Tolerance Patient tolerated treatment well    Behavior During Therapy WFL for tasks assessed/performed               Past Medical History:  Diagnosis Date   Allergy    Anxiety    Arthritis    Asthma    Chicken pox    Complication of anesthesia    slow to wake up from anesthesia   Depression    DJD (degenerative joint disease)    GERD (gastroesophageal reflux disease)    resolved after gastric surgery   H/O gastric bypass 2016   History of iron deficiency anemia    HSV infection    Migraines    Obese    Pneumonia    Childhood history   PONV (postoperative nausea and vomiting)    Status post dilation of esophageal narrowing    Varicose vein of leg    Past Surgical History:  Procedure Laterality Date   ANKLE FRACTURE SURGERY Left 03/08/2009   BREAST SURGERY Bilateral    Cyst Removal   ESOPHAGEAL MANOMETRY N/A 09/24/2022   Procedure: ESOPHAGEAL MANOMETRY (EM);  Surgeon: San Sandor GAILS, DO;  Location: [facility];  Service: Gastroenterology;  Laterality: N/A;   GASTRIC BYPASS  06/18/2015   LAPAROSCOPIC VAGINAL HYSTERECTOMY WITH SALPINGECTOMY Bilateral 05/15/2020   Procedure: LAPAROSCOPIC ASSISTED VAGINAL HYSTERECTOMY WITH SALPINGECTOMY;  Surgeon: Marget Lenis, MD;  Location: [facility];  Service: Gynecology;  Laterality: Bilateral;  need bed   LASER ABLATION     Varicose veins   TONSILLECTOMY AND ADENOIDECTOMY  2001   TUBAL LIGATION  12/19/1999   UPPER GASTROINTESTINAL ENDOSCOPY     UPPER GI ENDOSCOPY     Uterine ablation  12/2018    Patient Active Problem List   Diagnosis Date Noted   Osteopenia 09/26/2024   Dysuria 08/14/2023   Dysphagia    LUQ pain 03/01/2022   Change in bowel habits 03/01/2022   Loss of weight 03/01/2022   Chronic migraine without aura without status migrainosus, not intractable 06/14/2021   S/P laparoscopic assisted vaginal hysterectomy (LAVH) 05/15/2020   Migraines    GERD (gastroesophageal reflux disease)    Asthma    Seasonal and perennial allergic rhinitis 02/10/2019   Moderate persistent asthma, uncomplicated 02/10/2019   Anaphylactic shock due to adverse food reaction 02/10/2019   Atopic dermatitis 02/10/2019   History of Roux-en-Y gastric bypass 2016    PCP: Merna Huxley, NP  REFERRING PROVIDER: Joane Artist RAMAN, MD  REFERRING DIAG: Right hip pain; Neck pain  THERAPY DIAG:  Cervicalgia  Chronic left shoulder pain  Other low back pain  Pain in right hip  Muscle weakness (generalized)  Rationale for Evaluation and Treatment: Rehabilitation  ONSET DATE: Chronic   SUBJECTIVE:         SUBJECTIVE STATEMENT: Patient reports her neck was fine while she was away, and then when she went back to work her neck started pulling and hurting again. She also reports the left  hip has been bothering her a bit.   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 She reports L > R neck and shoulder pain, feels like it won't let go. She also reports lower back pain and right hip pain. She states that some days she can't raise her arm due to the pain.   PERTINENT HISTORY:  See PMH above  PAIN:  Are you having pain? Yes:  NPRS scale: reports 6/10 Pain location: Neck and lower back Pain description: Tight, pulling Aggravating factors: Sitting at work, sleeping Relieving factors: Putting pressure over the left neck region  PRECAUTIONS: None  PATIENT GOALS: Pain relief   OBJECTIVE:  Note: Objective measures  were completed at Evaluation unless otherwise noted. PATIENT SURVEYS:  PSFS: 6 Reaching / cleaning: 4 Walking comfortably: 6 Walking upright: 8  12/30/2024: PSFS:  Reaching / cleaning: 5 Walking comfortably: 9 Walking upright: 10  POSTURE:   Rounded shoulder posture  PALPATION: Tender to palpation bilateral upper trap and periscapular region   CERVICAL ROM:   Active ROM A/PROM (deg) eval   10/21/2024   12/30/2024  Flexion 50  60  Extension 35  35  Right lateral flexion   35  Left lateral flexion   40  Right rotation 65 70 70  Left rotation 55 65 68   (Blank rows = not tested)  UPPER EXTREMITY ROM:  Active ROM Right eval Left eval Left 12/30/2024  Shoulder flexion 155 140 155  Shoulder extension     Shoulder abduction     Shoulder adduction     Shoulder extension     Shoulder internal rotation     Shoulder external rotation     Elbow flexion     Elbow extension     Wrist flexion     Wrist extension     Wrist ulnar deviation     Wrist radial deviation     Wrist pronation     Wrist supination      (Blank rows = not tested)  UPPER EXTREMITY MMT:  MMT Right eval Left eval  Shoulder flexion 5 4  Shoulder extension    Shoulder abduction    Shoulder adduction    Shoulder extension    Shoulder internal rotation    Shoulder external rotation    Middle trapezius    Lower trapezius    Elbow flexion    Elbow extension    Wrist flexion    Wrist extension    Wrist ulnar deviation    Wrist radial deviation    Wrist pronation    Wrist supination    Grip strength     (Blank rows = not tested)  LUMBAR ROM:   Active  A/PROM  eval  Flexion WFL  Extension 75%  Right lateral flexion 75%  Left lateral flexion 75%  Right rotation WFL  Left rotation 75%   (Blank rows = not tested)   FUNCTIONAL TESTS:  DNF endurance: 6 seconds  12/30/2024: 8 seconds   TREATMENT [facility] Adult PT Treatment:                                                DATE:  01/02/2025 Prone CPA and left UPA through upper-mid cervical spine Supine mid thoracic flexion thrust manipulation Supine suboccipital release with  gentle manual traction Supine mid cervical lateral mobilization Supine cervical retraction with extension mobilization Sidelying thoracic rotation x 10 each Cat cow x 5 Seated cervical retraction with finger assist at chin 5 x 3 sec Seated cervical retraction 5 x 3 sec Seated cervical retraction with yellow 2 x 5 x 3 sec Prone on elbows cervical retraction 2 x 5 x 3 sec   PATIENT EDUCATION:  Education details: POC extension, HEP Person educated: Patient Education method: Explanation, Demonstration, Tactile cues, Verbal cues Education comprehension: verbalized understanding, returned demonstration, verbal cues required, tactile cues required, and needs further education  HOME EXERCISE PROGRAM: Access Code: [email or web address]    ASSESSMENT: CLINICAL IMPRESSION: Patient tolerated therapy well with no adverse effects. She reports overall improvement in her functional status on PSFS, but does continue to have neck tightness and pain with limitations in her neck motion and DNF endurance. Therapy focused on improving her cervical and thoracic mobility, and progressing her DNF training with added resistance and positioning against gravity. She does report improvement in symptoms following therapy. No changes were made to her HEP this visit. Patient would benefit from continued skilled PT to progress mobility and strength in order to reduce pain and maximize functional ability.   Eval: Patient is a 54 y.o. female who was seen today for physical therapy evaluation and treatment for chronic neck, shoulder, lower back, and right hip pain. Today's session focused primarily on neck and shoulder pain. She does exhibit limitations with her cervical and left shoulder motion, strength deficit of the left shoulder and DNF endurance impairment. Performed TPDN this visit  with good therapeutic benefit. Will further assess lower back and right hip at future session.    OBJECTIVE IMPAIRMENTS: decreased activity tolerance, decreased ROM, decreased strength, postural dysfunction, and pain.   ACTIVITY LIMITATIONS: carrying, lifting, sitting, sleeping, and reach over head  PARTICIPATION LIMITATIONS: meal prep, cleaning, community activity, and occupation  PERSONAL FACTORS: Fitness, Past/current experiences, and Time since onset of injury/illness/exacerbation are also affecting patient's functional outcome.    GOALS: Goals reviewed with patient? Yes  SHORT TERM GOALS: Target date: 01/27/2025  Patient will be I with initial HEP in order to progress with therapy. Baseline: HEP provided at eval 11/11/2024: independent with initial HEP Goal status: MET  2.  Patient will report neck and shoulder pain </= 5/10 in order to reduce functional limitations Baseline: reports 10/10 pain 11/11/2024: 6/10 01/02/2025: 6/10 Goal status: ONGOING  LONG TERM GOALS: Target date: 02/10/2025  Patient will be I with final HEP to maintain progress from PT. Baseline: HEP provided at eval 01/02/2025: progressing Goal status: ONGOING  2.  Patient will report PSFS >/= 8 in order to indicate improvement in their functional ability. Baseline: 6 01/02/2025: 8 Goal status: MET  3.  Patient will demonstrate cervical rotation >/= 70 deg bilaterally in order to reduce neck tightness and improve functional ability Baseline: see limitations above 01/02/2025: see limitations above Goal status: ONGOING  4.  Patient will demonstrate DNF endurance >/= 30 sec to improve postural control and reduce pain with sitting Baseline: 6 seconds 01/02/2025: 8 sec Goal status: ONGOING   PLAN: PT FREQUENCY: 1x/week  PT DURATION: 6 weeks  PLANNED INTERVENTIONS: 97164- PT Re-evaluation, 97750- Physical Performance Testing, 97110-Therapeutic exercises, 97530- Therapeutic activity, [id number]- Neuromuscular  re-education, 97535- Self Care, 02859- Manual therapy, 20560 (1-2 muscles), 20561 (3+ muscles)- Dry Needling, Patient/Family education, Joint mobilization, Joint manipulation, Spinal manipulation, Spinal mobilization, Cryotherapy, and Moist heat  PLAN FOR NEXT SESSION: Review HEP and  progress PRN, manual/TPDN for cervical and lumbar region, postural control and DNF endurance, core and hip strengthening   Elaine Daring, PT, DPT, LAT, ATC 12/30/24  11:14 AM Phone: [phone] Fax: [phone]       "

## 2024-12-30 NOTE — Progress Notes (Signed)
 "  Donna Park [facility] [address] [place] [address] Phone: [phone]   Assessment and Plan:     1. Neck pain (Primary) 2. Chronic bilateral thoracic back pain 3. Strain of right trapezius muscle, subsequent encounter 4. Strain of left trapezius muscle, subsequent encounter -Chronic with exacerbation, subsequent sports medicine visit - Recurrence of multiple areas of musculoskeletal pain including bilateral neck, bilateral thoracic spine, bilateral trapezius - Patient has benefited from trigger point injections in the past with last performed on 08/26/2024.  Elected for repeat trigger point injections today.  Tolerated well per note below  - Use meloxicam  15 mg daily as needed for breakthrough pain.  Recommend limiting chronic NSAIDs to 1-2 doses per week to prevent long-term side effects. Use Tylenol  500 to 1000 mg tablets 2-3 times a day as needed for day-to-day pain relief.    - Continue HEP and physical therapy  Trigger Point Injection: After informed consent was obtained, skin cleaned with alcohol  prep.  A total of 10 trigger points identified along bilateral cervical paraspinal, thoracic paraspinal, trapezius, rhomboid.  Injections given over area of pain for total injection of 2 mL Kenalog  40 mg/ML and 8 ml lidocaine  1% w/o epi.  Patient had relief after the injection without side effects.  Pt given signs of infection to watch for.   Pertinent previous records reviewed include none  Follow Up: 1 week for reevaluation.  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 Has been trying to do some stretches.    10/16/2023 Patient states neck  and shoulder flare    11/13/2023 Patient states thoracic and cervical tightness/stiffness     11/26/2023 Patient states anterior shoulder/ clavicle pain. Decreased ROM. Ibu doesn't help . 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 Heat helps. Pain for about 2 weeks. Would like to know if she can get a refill of fosamax     01/29/2024 Patient states right shoulder is still in pain,  the pain is deep. Neck is alright for the moment but can feel it trying to pull    02/26/2024 Patient states neck is good , just here for an adjustment low back and hips are in pain    03/18/2024 Patient states right hip is in pain. So in turn left hip is in pain. Neck is alright. Shoulder is still flared   04/01/2024 Patient states injections for neck and down the bad.    04/15/2024 Patient states back is great. No pain in the neck. Just an adjustment today. Right hip is tight and left ankle feels like its compacting and she can feel the metal.    05/13/2024 Patient states ready for an adjustment. Low back is really tight    05/27/2024 Patient states back and shoulders are tight    06/17/2024 Patient states low back and neck flare   07/15/2024 Patient states ready for adjustment    08/11/2024 Patient states low back  and neck are flared. Whatever you did last time helped    08/26/2024 Patient states ready for injections. Back and neck tightness    09/09/2024 Patient states neck tightness in one spot. Trap tightness just an adjustment    09/30/2024 Patient states neck and shoulders hurt today in the same area as usual.   10/28/2024 Patient states that she would like adjustment. Wants injection as next visit.    11/18/2024 Patient states would like to discuss lidocaine  and or kenalog  injections  12/30/2024 Patient states neck is still flared.  Would like injections   Relevant Historical Information: Migraines, GERD, history of Roux-en-Y gastric bypass  Additional pertinent review of systems negative.  Current Outpatient Medications  Medication Sig Dispense Refill   acetaminophen  (TYLENOL ) 500 MG tablet Take by mouth.     acyclovir  ointment (ZOVIRAX ) 5 % Apply onto the affected area every 3 hours. 15 g 1   albuterol  (VENTOLIN  HFA) 108 (90 Base) MCG/ACT inhaler Inhale 1 - 2 puffs into the lungs every 6 hours as needed for wheezing or shortness of breath. 20.1 g 0   amitriptyline  (ELAVIL ) 10 MG tablet Take 1 tablet (10 mg total) by mouth at bedtime. 90 tablet 1   Azelastine -Fluticasone  (DYMISTA ) 137-50 MCG/ACT SUSP Place 2 sprays into both nostrils 2 (two) times daily as needed. 23 g 1   azithromycin  (ZITHROMAX ) 250 MG tablet Take two tablets by mouth on the first day and then one tablet daily for four more days. 6 each 0   botulinum toxin Type A  (BOTOX ) 200 units injection Provider to inject 155 units into the muscles of the head and neck every 12 weeks. Discard remainder. 1 each 3   CALCIUM PO Take 1 tablet by mouth daily in the afternoon.     cetirizine  (ZYRTEC ) 10 MG tablet Take 2 tablets (20 mg total) by mouth 2 (two) times daily. 360 tablet 1   diazepam  (VALIUM ) 10 MG tablet Take 1 tablet (10 mg total) by mouth at bedtime as needed for sleep. 30 tablet 2   EPINEPHrine  (NEFFY ) 2 MG/0.1ML SOLN Place 2 mg into the nose daily as needed. 6 each 1   Estradiol  (ESTROGEL ) 0.75 MG/1.25 GM (0.06%) topical gel Apply 0.5 pumps every day by topical route. 37.5 g 4   fluconazole  (DIFLUCAN ) 150 MG tablet Take 1 tablet (150 mg total) by mouth daily. 90 tablet 2   Fluticasone -Umeclidin-Vilant (TRELEGY ELLIPTA ) 200-62.5-25 MCG/ACT AEPB Inhale 1 puff into the lungs daily. 180 each 1   Fremanezumab -vfrm (AJOVY ) 225 MG/1.5ML SOAJ Inject 225 mg into the skin every 30 (thirty) days. 1.5 mL 11   Hypertonic Nasal Wash (SINUS RINSE REFILL) PACK Use one  packet dissolved in water as needed. (Patient taking differently: Place 1 each into the nose in the morning and at bedtime.) 100 each 5   ibuprofen  (ADVIL ) 800 MG tablet Take 1 tablet (800 mg total) by mouth 3 (three) times daily as needed (pain.). 270 tablet 3   ipratropium (ATROVENT ) 0.06 % nasal spray Place 2 sprays into both nostrils 4 (four) times daily. 45 mL 5   linaclotide  (LINZESS ) 290 MCG CAPS capsule Take 1 capsule (290 mcg total) by mouth daily before breakfast. Patient needs follow up appointment for future refills. Please call [phone] to schedule an appointment. 90 capsule 4   meloxicam  (MOBIC ) 15 MG tablet Take 1 tablet (15 mg total) by mouth at bedtime as needed for pain. 90 tablet 3   nystatin  (MYCOSTATIN ) 100000 UNIT/ML suspension Take 5 mLs (  500,000 Units total) by mouth 4 (four) times daily. Gargle and spit. 473 mL 0   ondansetron  (ZOFRAN -ODT) 4 MG disintegrating tablet Dissolve 1-2 tablets (4-8 mg total) by mouth every 8 (eight) hours as needed. May take with Rizatriptan  90 tablet 3   pantoprazole  (PROTONIX ) 40 MG tablet Take 1 tablet (40 mg total) by mouth at bedtime. 90 tablet 3   Pediatric Multiple Vit-C-FA (MULTIVITAMIN ANIMAL SHAPES, WITH CA/FA,) with C & FA chewable tablet Chew 2 tablets by mouth daily. Bariactric advatage     predniSONE  (DELTASONE ) 10 MG tablet Take 3 tablets by mouth by mouth twice daily for 3 days THEN take 2 tablets twice daily for 3 days THEN take 1 tablet twice daily for 3 days 36 tablet 0   Rimegepant Sulfate  (NURTEC) 75 MG TBDP Take 1 tablet (75 mg total) by mouth daily as needed. 16 tablet 11   risedronate  (ACTONEL ) 150 MG tablet Take 1 tablet (150 mg total) by mouth every 30 (thirty) days. with water on empty stomach, nothing by mouth or lie down for next 30 minutes. 12 tablet 1   rizatriptan  (MAXALT -MLT) 10 MG disintegrating tablet Dissolve 1 tablet (10 mg total) by mouth as needed at onset of migraine. May take another tablet 2 hours later if  needed, max 2 tablets in 24 hours. 27 tablet 3   simethicone  (MYLICON) 80 MG chewable tablet Chew 80 mg by mouth every 6 (six) hours as needed for flatulence.     tezepelumab -ekko (TEZSPIRE ) 210 MG/1. syringe Inject 1.91 mLs (210 mg total) into the skin every 28 (twenty-eight) days. 1.91 mL 11   tizanidine  (ZANAFLEX ) 6 MG capsule Take 1 capsule (6 mg total) by mouth at bedtime. 90 capsule 3   topiramate  (TOPAMAX ) 25 MG tablet Take 1 tablet (25 mg total) by mouth at bedtime. 30 tablet 6   triamcinolone  ointment (KENALOG ) 0.1 % Apply topically 2 (two) times daily. 90 g 2   valACYclovir  (VALTREX ) 500 MG tablet Take 2 tablets (1,000 mg total) by mouth 2 (two) times daily. 360 tablet 1   Vibegron  (GEMTESA ) 75 MG TABS Take 1 tablet (75 mg total) by mouth daily. 90 tablet 4   Vibegron  (GEMTESA ) 75 MG TABS Take 1 tablet (75 mg total) by mouth daily for 90 days 90 tablet 4   Current Facility-Administered Medications  Medication Dose Route Frequency Provider Last Rate Last Admin   Fremanezumab -vfrm SOSY 225 mg  225 mg Subcutaneous Once        tezepelumab -ekko (TEZSPIRE ) 210 MG/1. syringe 210 mg  210 mg Subcutaneous Q28 days Iva Marty Saltness, MD   210 mg at 12/16/24 9092      Objective:     Vitals:   12/30/24 1059  Weight: 185 lb (83.9 kg)  Height: 5' 6 (1.676 m)      Body mass index is 29.86 kg/m.    Physical Exam:     Gen: Appears well, nad, nontoxic and pleasant Psych: Alert and oriented, appropriate mood and affect Neuro: sensation intact, strength is 5/5 in upper and lower extremities, muscle tone wnl Skin: no susupicious lesions or rashes  Neck/back - Normal skin, Spine with normal alignment and no deformity.     tenderness to vertebral process palpation most intense at C7 Bilateral cervical and thoracic paraspinous muscles are   tender and without spasm NTTP gluteal musculature  Gait normal   Electronically signed by:  Donna Park [facility] 11:54 AM 12/30/24 "

## 2025-01-02 ENCOUNTER — Other Ambulatory Visit: Payer: Self-pay

## 2025-01-06 ENCOUNTER — Encounter: Admitting: Physical Therapy

## 2025-01-06 ENCOUNTER — Other Ambulatory Visit: Payer: Self-pay

## 2025-01-06 ENCOUNTER — Ambulatory Visit: Admitting: Sports Medicine

## 2025-01-06 NOTE — Progress Notes (Signed)
 Due to impending winter weather conditions specialty medication(s) Tezepelumab -ekko (Tezspire ) will be filled 01/11/25 to be Courier to Provider Office by 01/12/25.

## 2025-01-11 ENCOUNTER — Other Ambulatory Visit: Payer: Self-pay

## 2025-01-13 ENCOUNTER — Ambulatory Visit

## 2025-04-13 ENCOUNTER — Ambulatory Visit: Admitting: Allergy & Immunology
